# Patient Record
Sex: Male | Born: 1945
Health system: Southern US, Community
[De-identification: ages and names within clinical notes are randomized; demographics above are authoritative.]

## PROBLEM LIST (undated history)

## (undated) DIAGNOSIS — I471 Supraventricular tachycardia: Secondary | ICD-10-CM

## (undated) DIAGNOSIS — K5790 Diverticulosis of intestine, part unspecified, without perforation or abscess without bleeding: Secondary | ICD-10-CM

## (undated) DIAGNOSIS — M545 Low back pain, unspecified: Secondary | ICD-10-CM

## (undated) DIAGNOSIS — G4730 Sleep apnea, unspecified: Secondary | ICD-10-CM

## (undated) DIAGNOSIS — I739 Peripheral vascular disease, unspecified: Secondary | ICD-10-CM

## (undated) DIAGNOSIS — I10 Essential (primary) hypertension: Secondary | ICD-10-CM

## (undated) DIAGNOSIS — I209 Angina pectoris, unspecified: Secondary | ICD-10-CM

## (undated) DIAGNOSIS — J189 Pneumonia, unspecified organism: Secondary | ICD-10-CM

## (undated) DIAGNOSIS — G4733 Obstructive sleep apnea (adult) (pediatric): Secondary | ICD-10-CM

## (undated) DIAGNOSIS — R51 Headache: Secondary | ICD-10-CM

## (undated) DIAGNOSIS — I639 Cerebral infarction, unspecified: Secondary | ICD-10-CM

## (undated) DIAGNOSIS — I219 Acute myocardial infarction, unspecified: Secondary | ICD-10-CM

## (undated) DIAGNOSIS — K648 Other hemorrhoids: Secondary | ICD-10-CM

## (undated) DIAGNOSIS — I82409 Acute embolism and thrombosis of unspecified deep veins of unspecified lower extremity: Secondary | ICD-10-CM

## (undated) DIAGNOSIS — I679 Cerebrovascular disease, unspecified: Secondary | ICD-10-CM

## (undated) DIAGNOSIS — E785 Hyperlipidemia, unspecified: Secondary | ICD-10-CM

## (undated) DIAGNOSIS — Z87442 Personal history of urinary calculi: Secondary | ICD-10-CM

## (undated) DIAGNOSIS — M1990 Unspecified osteoarthritis, unspecified site: Secondary | ICD-10-CM

## (undated) DIAGNOSIS — K2960 Other gastritis without bleeding: Secondary | ICD-10-CM

## (undated) DIAGNOSIS — K257 Chronic gastric ulcer without hemorrhage or perforation: Secondary | ICD-10-CM

## (undated) DIAGNOSIS — I2510 Atherosclerotic heart disease of native coronary artery without angina pectoris: Secondary | ICD-10-CM

## (undated) DIAGNOSIS — R0602 Shortness of breath: Secondary | ICD-10-CM

## (undated) DIAGNOSIS — K219 Gastro-esophageal reflux disease without esophagitis: Secondary | ICD-10-CM

## (undated) HISTORY — DX: Diverticulosis of intestine, part unspecified, without perforation or abscess without bleeding: K57.90

## (undated) HISTORY — DX: Low back pain, unspecified: M54.50

## (undated) HISTORY — PX: UPPER GASTROINTESTINAL ENDOSCOPY: SHX188

## (undated) HISTORY — PX: ARTHROSCOPY KNEE W/ DRILLING: SUR92

## (undated) HISTORY — DX: Other hemorrhoids: K64.8

## (undated) HISTORY — DX: Peripheral vascular disease, unspecified: I73.9

## (undated) HISTORY — DX: Cerebrovascular disease, unspecified: I67.9

## (undated) HISTORY — DX: Unspecified osteoarthritis, unspecified site: M19.90

## (undated) HISTORY — DX: Low back pain: M54.5

## (undated) HISTORY — DX: Other gastritis without bleeding: K29.60

## (undated) HISTORY — DX: Obstructive sleep apnea (adult) (pediatric): G47.33

## (undated) HISTORY — DX: Atherosclerotic heart disease of native coronary artery without angina pectoris: I25.10

## (undated) HISTORY — PX: CATARACT EXTRACTION: SUR2

## (undated) HISTORY — DX: Gastro-esophageal reflux disease without esophagitis: K21.9

## (undated) HISTORY — DX: Essential (primary) hypertension: I10

## (undated) HISTORY — DX: Cerebral infarction, unspecified: I63.9

## (undated) HISTORY — DX: Hyperlipidemia, unspecified: E78.5

## (undated) HISTORY — PX: COLONOSCOPY: SHX174

---
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
## 1995-11-03 DIAGNOSIS — I219 Acute myocardial infarction, unspecified: Secondary | ICD-10-CM

## 1995-11-03 HISTORY — PX: CORONARY ANGIOPLASTY WITH STENT PLACEMENT: SHX49

## 1995-11-03 HISTORY — DX: Acute myocardial infarction, unspecified: I21.9

## 1999-05-21 ENCOUNTER — Observation Stay (HOSPITAL_COMMUNITY): Admission: AD | Admit: 1999-05-21 | Discharge: 1999-05-22 | Payer: Self-pay | Admitting: Cardiology

## 1999-05-27 ENCOUNTER — Observation Stay (HOSPITAL_COMMUNITY): Admission: EM | Admit: 1999-05-27 | Discharge: 1999-05-28 | Payer: Self-pay | Admitting: Emergency Medicine

## 1999-05-27 ENCOUNTER — Encounter: Payer: Self-pay | Admitting: Emergency Medicine

## 1999-07-27 ENCOUNTER — Encounter: Payer: Self-pay | Admitting: Orthopedic Surgery

## 1999-07-27 ENCOUNTER — Ambulatory Visit (HOSPITAL_COMMUNITY): Admission: RE | Admit: 1999-07-27 | Discharge: 1999-07-27 | Payer: Self-pay | Admitting: Orthopedic Surgery

## 2000-02-16 ENCOUNTER — Ambulatory Visit (HOSPITAL_COMMUNITY): Admission: RE | Admit: 2000-02-16 | Discharge: 2000-02-16 | Payer: Self-pay | Admitting: Orthopedic Surgery

## 2000-02-16 ENCOUNTER — Encounter: Payer: Self-pay | Admitting: Orthopedic Surgery

## 2000-07-07 ENCOUNTER — Encounter: Payer: Self-pay | Admitting: Orthopedic Surgery

## 2000-07-07 ENCOUNTER — Encounter: Admission: RE | Admit: 2000-07-07 | Discharge: 2000-07-07 | Payer: Self-pay | Admitting: Orthopedic Surgery

## 2000-07-08 ENCOUNTER — Ambulatory Visit (HOSPITAL_BASED_OUTPATIENT_CLINIC_OR_DEPARTMENT_OTHER): Admission: RE | Admit: 2000-07-08 | Discharge: 2000-07-08 | Payer: Self-pay | Admitting: Orthopedic Surgery

## 2001-09-01 ENCOUNTER — Ambulatory Visit (HOSPITAL_COMMUNITY): Admission: RE | Admit: 2001-09-01 | Discharge: 2001-09-01 | Payer: Self-pay | Admitting: Gastroenterology

## 2001-09-01 ENCOUNTER — Encounter: Payer: Self-pay | Admitting: Gastroenterology

## 2002-06-11 ENCOUNTER — Inpatient Hospital Stay (HOSPITAL_COMMUNITY): Admission: EM | Admit: 2002-06-11 | Discharge: 2002-06-12 | Payer: Self-pay | Admitting: Emergency Medicine

## 2002-06-11 ENCOUNTER — Encounter: Payer: Self-pay | Admitting: Emergency Medicine

## 2002-06-12 ENCOUNTER — Encounter: Payer: Self-pay | Admitting: *Deleted

## 2002-07-04 ENCOUNTER — Ambulatory Visit (HOSPITAL_COMMUNITY): Admission: RE | Admit: 2002-07-04 | Discharge: 2002-07-04 | Payer: Self-pay | Admitting: Cardiovascular Disease

## 2003-08-27 ENCOUNTER — Inpatient Hospital Stay (HOSPITAL_COMMUNITY): Admission: EM | Admit: 2003-08-27 | Discharge: 2003-08-28 | Payer: Self-pay | Admitting: Emergency Medicine

## 2003-08-27 ENCOUNTER — Encounter: Payer: Self-pay | Admitting: Emergency Medicine

## 2003-09-05 ENCOUNTER — Ambulatory Visit (HOSPITAL_COMMUNITY): Admission: RE | Admit: 2003-09-05 | Discharge: 2003-09-05 | Payer: Self-pay | Admitting: Internal Medicine

## 2005-03-11 ENCOUNTER — Ambulatory Visit: Payer: Self-pay | Admitting: Internal Medicine

## 2005-03-23 ENCOUNTER — Ambulatory Visit: Payer: Self-pay | Admitting: Cardiology

## 2005-03-31 ENCOUNTER — Ambulatory Visit: Payer: Self-pay | Admitting: Cardiology

## 2005-04-10 ENCOUNTER — Ambulatory Visit: Payer: Self-pay

## 2005-05-14 ENCOUNTER — Ambulatory Visit: Payer: Self-pay | Admitting: Internal Medicine

## 2005-05-14 ENCOUNTER — Inpatient Hospital Stay (HOSPITAL_COMMUNITY): Admission: EM | Admit: 2005-05-14 | Discharge: 2005-05-15 | Payer: Self-pay | Admitting: Emergency Medicine

## 2005-05-22 ENCOUNTER — Ambulatory Visit: Payer: Self-pay | Admitting: Internal Medicine

## 2005-09-02 ENCOUNTER — Ambulatory Visit: Payer: Self-pay | Admitting: Cardiology

## 2006-04-02 ENCOUNTER — Ambulatory Visit: Payer: Self-pay | Admitting: Internal Medicine

## 2006-04-20 ENCOUNTER — Ambulatory Visit: Payer: Self-pay

## 2006-04-23 ENCOUNTER — Ambulatory Visit: Payer: Self-pay | Admitting: Internal Medicine

## 2006-05-06 ENCOUNTER — Ambulatory Visit: Payer: Self-pay | Admitting: Cardiology

## 2006-07-12 ENCOUNTER — Ambulatory Visit: Payer: Self-pay | Admitting: Internal Medicine

## 2006-12-06 ENCOUNTER — Ambulatory Visit: Payer: Self-pay | Admitting: Internal Medicine

## 2007-04-12 ENCOUNTER — Ambulatory Visit: Payer: Self-pay

## 2007-09-01 ENCOUNTER — Ambulatory Visit: Payer: Self-pay | Admitting: Internal Medicine

## 2007-09-07 ENCOUNTER — Encounter: Payer: Self-pay | Admitting: *Deleted

## 2007-09-07 DIAGNOSIS — Z9089 Acquired absence of other organs: Secondary | ICD-10-CM | POA: Insufficient documentation

## 2007-09-07 DIAGNOSIS — Z9861 Coronary angioplasty status: Secondary | ICD-10-CM

## 2007-09-07 DIAGNOSIS — Z9889 Other specified postprocedural states: Secondary | ICD-10-CM | POA: Insufficient documentation

## 2007-09-07 DIAGNOSIS — I219 Acute myocardial infarction, unspecified: Secondary | ICD-10-CM | POA: Insufficient documentation

## 2007-09-07 DIAGNOSIS — E1169 Type 2 diabetes mellitus with other specified complication: Secondary | ICD-10-CM

## 2007-09-07 DIAGNOSIS — Z9189 Other specified personal risk factors, not elsewhere classified: Secondary | ICD-10-CM | POA: Insufficient documentation

## 2007-09-07 DIAGNOSIS — I2510 Atherosclerotic heart disease of native coronary artery without angina pectoris: Secondary | ICD-10-CM | POA: Insufficient documentation

## 2007-09-07 DIAGNOSIS — I10 Essential (primary) hypertension: Secondary | ICD-10-CM | POA: Insufficient documentation

## 2007-09-07 DIAGNOSIS — E785 Hyperlipidemia, unspecified: Secondary | ICD-10-CM | POA: Insufficient documentation

## 2007-09-07 HISTORY — DX: Coronary angioplasty status: Z98.61

## 2007-09-07 HISTORY — DX: Type 2 diabetes mellitus with other specified complication: E11.69

## 2007-09-07 HISTORY — DX: Essential (primary) hypertension: I10

## 2007-10-18 ENCOUNTER — Ambulatory Visit: Payer: Self-pay | Admitting: Internal Medicine

## 2007-10-18 ENCOUNTER — Encounter: Payer: Self-pay | Admitting: Internal Medicine

## 2007-10-18 DIAGNOSIS — R059 Cough, unspecified: Secondary | ICD-10-CM | POA: Insufficient documentation

## 2007-10-18 DIAGNOSIS — R05 Cough: Secondary | ICD-10-CM | POA: Insufficient documentation

## 2007-10-18 DIAGNOSIS — D485 Neoplasm of uncertain behavior of skin: Secondary | ICD-10-CM | POA: Insufficient documentation

## 2007-10-18 HISTORY — DX: Neoplasm of uncertain behavior of skin: D48.5

## 2007-10-19 LAB — CONVERTED CEMR LAB
ALT: 31 units/L (ref 0–53)
AST: 21 units/L (ref 0–37)
Albumin: 4.2 g/dL (ref 3.5–5.2)
Alkaline Phosphatase: 67 units/L (ref 39–117)
BUN: 14 mg/dL (ref 6–23)
Basophils Absolute: 0 10*3/uL (ref 0.0–0.1)
Basophils Relative: 0.6 % (ref 0.0–1.0)
Bilirubin Urine: NEGATIVE
Bilirubin, Direct: 0.1 mg/dL (ref 0.0–0.3)
CO2: 27 meq/L (ref 19–32)
Calcium: 9.6 mg/dL (ref 8.4–10.5)
Chloride: 103 meq/L (ref 96–112)
Cholesterol: 331 mg/dL (ref 0–200)
Creatinine, Ser: 1.1 mg/dL (ref 0.4–1.5)
Direct LDL: 219 mg/dL
Eosinophils Absolute: 0.6 10*3/uL (ref 0.0–0.6)
Eosinophils Relative: 9.9 % — ABNORMAL HIGH (ref 0.0–5.0)
GFR calc Af Amer: 88 mL/min
GFR calc non Af Amer: 72 mL/min
Glucose, Bld: 103 mg/dL — ABNORMAL HIGH (ref 70–99)
HCT: 47 % (ref 39.0–52.0)
HDL: 34.9 mg/dL — ABNORMAL LOW (ref >39.0)
Hemoglobin: 16.3 g/dL (ref 13.0–17.0)
Ketones, ur: NEGATIVE mg/dL
Leukocytes, UA: NEGATIVE
Lymphocytes Relative: 28.6 % (ref 12.0–46.0)
MCHC: 34.6 g/dL (ref 30.0–36.0)
MCV: 90.3 fL (ref 78.0–100.0)
Monocytes Absolute: 0.3 10*3/uL (ref 0.2–0.7)
Monocytes Relative: 5.5 % (ref 3.0–11.0)
Neutro Abs: 3.4 10*3/uL (ref 1.4–7.7)
Neutrophils Relative %: 55.4 % (ref 43.0–77.0)
Nitrite: NEGATIVE
PSA: 1.85 ng/mL (ref 0.10–4.00)
Platelets: 280 10*3/uL (ref 150–400)
Potassium: 4.4 meq/L (ref 3.5–5.1)
RBC: 5.2 M/uL (ref 4.22–5.81)
RDW: 12.7 % (ref 11.5–14.6)
Sodium: 138 meq/L (ref 135–145)
Specific Gravity, Urine: 1.015 (ref 1.000–1.03)
TSH: 3.59 microintl units/mL (ref 0.35–5.50)
Total Bilirubin: 0.7 mg/dL (ref 0.3–1.2)
Total CHOL/HDL Ratio: 9.5
Total Protein, Urine: NEGATIVE mg/dL
Total Protein: 7.1 g/dL (ref 6.0–8.3)
Triglycerides: 347 mg/dL (ref 0–149)
Urine Glucose: NEGATIVE mg/dL
Urobilinogen, UA: 0.2 (ref 0.0–1.0)
VLDL: 69 mg/dL — ABNORMAL HIGH (ref 0–40)
WBC: 6 10*3/uL (ref 4.5–10.5)
pH: 5.5 (ref 5.0–8.0)

## 2007-11-08 ENCOUNTER — Ambulatory Visit: Payer: Self-pay | Admitting: Internal Medicine

## 2007-11-22 ENCOUNTER — Ambulatory Visit: Payer: Self-pay | Admitting: Gastroenterology

## 2007-12-06 ENCOUNTER — Ambulatory Visit: Payer: Self-pay | Admitting: Gastroenterology

## 2007-12-06 ENCOUNTER — Encounter: Payer: Self-pay | Admitting: Gastroenterology

## 2008-02-20 ENCOUNTER — Ambulatory Visit: Payer: Self-pay | Admitting: Internal Medicine

## 2008-02-20 DIAGNOSIS — R109 Unspecified abdominal pain: Secondary | ICD-10-CM | POA: Insufficient documentation

## 2008-02-20 DIAGNOSIS — K219 Gastro-esophageal reflux disease without esophagitis: Secondary | ICD-10-CM | POA: Insufficient documentation

## 2008-02-20 HISTORY — DX: Gastro-esophageal reflux disease without esophagitis: K21.9

## 2008-02-20 LAB — CONVERTED CEMR LAB
ALT: 25 units/L (ref 0–53)
AST: 19 units/L (ref 0–37)
Albumin: 3.9 g/dL (ref 3.5–5.2)
Alkaline Phosphatase: 67 units/L (ref 39–117)
Amylase: 69 units/L (ref 27–131)
BUN: 21 mg/dL (ref 6–23)
Basophils Absolute: 0 10*3/uL (ref 0.0–0.1)
Basophils Relative: 0.5 % (ref 0.0–1.0)
Bilirubin Urine: NEGATIVE
Bilirubin, Direct: 0.1 mg/dL (ref 0.0–0.3)
CO2: 31 meq/L (ref 19–32)
Calcium: 9.3 mg/dL (ref 8.4–10.5)
Chloride: 108 meq/L (ref 96–112)
Creatinine, Ser: 1.2 mg/dL (ref 0.4–1.5)
Eosinophils Absolute: 0.8 10*3/uL — ABNORMAL HIGH (ref 0.0–0.7)
Eosinophils Relative: 12.7 % — ABNORMAL HIGH (ref 0.0–5.0)
GFR calc Af Amer: 79 mL/min
GFR calc non Af Amer: 65 mL/min
Glucose, Bld: 107 mg/dL — ABNORMAL HIGH (ref 70–99)
HCT: 44.9 % (ref 39.0–52.0)
Hemoglobin, Urine: NEGATIVE
Hemoglobin: 15.5 g/dL (ref 13.0–17.0)
Ketones, ur: NEGATIVE mg/dL
Leukocytes, UA: NEGATIVE
Lymphocytes Relative: 27.2 % (ref 12.0–46.0)
MCHC: 34.4 g/dL (ref 30.0–36.0)
MCV: 91.5 fL (ref 78.0–100.0)
Monocytes Absolute: 0.4 10*3/uL (ref 0.1–1.0)
Monocytes Relative: 7.4 % (ref 3.0–12.0)
Neutro Abs: 3.2 10*3/uL (ref 1.4–7.7)
Neutrophils Relative %: 52.2 % (ref 43.0–77.0)
Nitrite: NEGATIVE
Platelets: 294 10*3/uL (ref 150–400)
Potassium: 5.2 meq/L — ABNORMAL HIGH (ref 3.5–5.1)
RBC: 4.91 M/uL (ref 4.22–5.81)
RDW: 13.2 % (ref 11.5–14.6)
Sed Rate: 10 mm/hr (ref 0–16)
Sodium: 142 meq/L (ref 135–145)
Specific Gravity, Urine: 1.025 (ref 1.000–1.03)
Total Bilirubin: 0.6 mg/dL (ref 0.3–1.2)
Total Protein, Urine: NEGATIVE mg/dL
Total Protein: 6.6 g/dL (ref 6.0–8.3)
Urine Glucose: NEGATIVE mg/dL
Urobilinogen, UA: 0.2 (ref 0.0–1.0)
WBC: 6 10*3/uL (ref 4.5–10.5)
pH: 6 (ref 5.0–8.0)

## 2008-02-23 ENCOUNTER — Ambulatory Visit: Payer: Self-pay | Admitting: Internal Medicine

## 2008-03-06 ENCOUNTER — Ambulatory Visit: Payer: Self-pay | Admitting: Internal Medicine

## 2008-04-23 ENCOUNTER — Ambulatory Visit: Payer: Self-pay | Admitting: Internal Medicine

## 2008-08-20 ENCOUNTER — Ambulatory Visit: Payer: Self-pay | Admitting: Internal Medicine

## 2008-08-20 ENCOUNTER — Encounter: Payer: Self-pay | Admitting: Internal Medicine

## 2008-12-19 ENCOUNTER — Ambulatory Visit: Payer: Self-pay | Admitting: Internal Medicine

## 2008-12-19 DIAGNOSIS — R609 Edema, unspecified: Secondary | ICD-10-CM | POA: Insufficient documentation

## 2008-12-19 DIAGNOSIS — I872 Venous insufficiency (chronic) (peripheral): Secondary | ICD-10-CM

## 2008-12-19 HISTORY — DX: Venous insufficiency (chronic) (peripheral): I87.2

## 2009-02-12 ENCOUNTER — Ambulatory Visit: Payer: Self-pay | Admitting: Internal Medicine

## 2009-02-12 DIAGNOSIS — J209 Acute bronchitis, unspecified: Secondary | ICD-10-CM | POA: Insufficient documentation

## 2009-04-03 ENCOUNTER — Ambulatory Visit: Payer: Self-pay | Admitting: Cardiology

## 2009-04-03 DIAGNOSIS — R0602 Shortness of breath: Secondary | ICD-10-CM | POA: Insufficient documentation

## 2009-04-03 DIAGNOSIS — F17200 Nicotine dependence, unspecified, uncomplicated: Secondary | ICD-10-CM | POA: Insufficient documentation

## 2009-04-03 HISTORY — DX: Shortness of breath: R06.02

## 2009-04-12 ENCOUNTER — Encounter: Payer: Self-pay | Admitting: Cardiology

## 2009-04-12 ENCOUNTER — Ambulatory Visit: Payer: Self-pay

## 2009-06-18 ENCOUNTER — Ambulatory Visit: Payer: Self-pay | Admitting: Internal Medicine

## 2009-06-18 DIAGNOSIS — J029 Acute pharyngitis, unspecified: Secondary | ICD-10-CM | POA: Insufficient documentation

## 2010-02-27 ENCOUNTER — Ambulatory Visit: Payer: Self-pay | Admitting: Internal Medicine

## 2010-02-27 DIAGNOSIS — M545 Low back pain, unspecified: Secondary | ICD-10-CM

## 2010-02-27 DIAGNOSIS — M1990 Unspecified osteoarthritis, unspecified site: Secondary | ICD-10-CM | POA: Insufficient documentation

## 2010-02-27 DIAGNOSIS — M2550 Pain in unspecified joint: Secondary | ICD-10-CM

## 2010-02-27 HISTORY — DX: Low back pain, unspecified: M54.50

## 2010-02-27 HISTORY — DX: Pain in unspecified joint: M25.50

## 2010-02-27 LAB — CONVERTED CEMR LAB
ALT: 20 units/L (ref 0–53)
AST: 16 units/L (ref 0–37)
Albumin: 4.1 g/dL (ref 3.5–5.2)
Alkaline Phosphatase: 69 units/L (ref 39–117)
BUN: 19 mg/dL (ref 6–23)
Bilirubin, Direct: 0.1 mg/dL (ref 0.0–0.3)
CO2: 28 meq/L (ref 19–32)
Calcium: 9.4 mg/dL (ref 8.4–10.5)
Chloride: 106 meq/L (ref 96–112)
Cholesterol: 246 mg/dL — ABNORMAL HIGH (ref 0–200)
Creatinine, Ser: 1.1 mg/dL (ref 0.4–1.5)
Direct LDL: 165.4 mg/dL
GFR calc non Af Amer: 71.58 mL/min (ref >60)
Glucose, Bld: 95 mg/dL (ref 70–99)
HDL: 36.3 mg/dL — ABNORMAL LOW (ref >39.00)
Potassium: 4.6 meq/L (ref 3.5–5.1)
Rheumatoid fact SerPl-aCnc: <20 intl units/mL (ref 0.0–20.0)
Sed Rate: 14 mm/hr (ref 0–22)
Sodium: 140 meq/L (ref 135–145)
TSH: 2.63 microintl units/mL (ref 0.35–5.50)
Total Bilirubin: 0.5 mg/dL (ref 0.3–1.2)
Total CHOL/HDL Ratio: 7
Total Protein: 6.4 g/dL (ref 6.0–8.3)
Triglycerides: 193 mg/dL — ABNORMAL HIGH (ref 0.0–149.0)
Uric Acid, Serum: 5.6 mg/dL (ref 4.0–7.8)
VLDL: 38.6 mg/dL (ref 0.0–40.0)
Vitamin B-12: 557 pg/mL (ref 211–911)

## 2010-03-12 ENCOUNTER — Encounter (INDEPENDENT_AMBULATORY_CARE_PROVIDER_SITE_OTHER): Payer: Self-pay | Admitting: *Deleted

## 2010-03-12 ENCOUNTER — Telehealth: Payer: Self-pay | Admitting: Cardiology

## 2010-04-01 ENCOUNTER — Ambulatory Visit: Payer: Self-pay | Admitting: Internal Medicine

## 2010-04-02 ENCOUNTER — Inpatient Hospital Stay (HOSPITAL_COMMUNITY): Admission: EM | Admit: 2010-04-02 | Discharge: 2010-04-04 | Payer: Self-pay | Admitting: Emergency Medicine

## 2010-04-02 ENCOUNTER — Ambulatory Visit: Payer: Self-pay | Admitting: Cardiology

## 2010-04-08 ENCOUNTER — Encounter: Payer: Self-pay | Admitting: Cardiology

## 2010-04-22 ENCOUNTER — Encounter: Payer: Self-pay | Admitting: Physician Assistant

## 2010-04-23 ENCOUNTER — Encounter: Payer: Self-pay | Admitting: Cardiology

## 2010-04-23 ENCOUNTER — Ambulatory Visit: Payer: Self-pay

## 2010-04-23 ENCOUNTER — Encounter: Payer: Self-pay | Admitting: Physician Assistant

## 2010-04-23 ENCOUNTER — Ambulatory Visit: Payer: Self-pay | Admitting: Cardiovascular Disease

## 2010-04-23 DIAGNOSIS — I6529 Occlusion and stenosis of unspecified carotid artery: Secondary | ICD-10-CM | POA: Insufficient documentation

## 2010-05-01 ENCOUNTER — Encounter: Payer: Self-pay | Admitting: Cardiology

## 2010-06-02 ENCOUNTER — Ambulatory Visit: Payer: Self-pay | Admitting: Cardiovascular Disease

## 2010-06-02 DIAGNOSIS — I70219 Atherosclerosis of native arteries of extremities with intermittent claudication, unspecified extremity: Secondary | ICD-10-CM | POA: Insufficient documentation

## 2010-06-02 HISTORY — DX: Atherosclerosis of native arteries of extremities with intermittent claudication, unspecified extremity: I70.219

## 2010-06-30 ENCOUNTER — Ambulatory Visit: Payer: Self-pay | Admitting: Cardiology

## 2010-07-23 ENCOUNTER — Ambulatory Visit: Payer: Self-pay | Admitting: Cardiology

## 2010-08-06 ENCOUNTER — Encounter (INDEPENDENT_AMBULATORY_CARE_PROVIDER_SITE_OTHER): Payer: Self-pay | Admitting: *Deleted

## 2010-08-06 LAB — CONVERTED CEMR LAB
ALT: 19 units/L (ref 0–53)
AST: 15 units/L (ref 0–37)
Albumin: 4.1 g/dL (ref 3.5–5.2)
Alkaline Phosphatase: 58 units/L (ref 39–117)
Bilirubin, Direct: 0.1 mg/dL (ref 0.0–0.3)
Cholesterol: 197 mg/dL (ref 0–200)
HDL: 42.8 mg/dL (ref >39.00)
LDL Cholesterol: 125 mg/dL — ABNORMAL HIGH (ref 0–99)
Total Bilirubin: 0.4 mg/dL (ref 0.3–1.2)
Total CHOL/HDL Ratio: 5
Total Protein: 6.4 g/dL (ref 6.0–8.3)
Triglycerides: 145 mg/dL (ref 0.0–149.0)
VLDL: 29 mg/dL (ref 0.0–40.0)

## 2010-08-13 ENCOUNTER — Telehealth: Payer: Self-pay | Admitting: Cardiology

## 2010-09-02 HISTORY — PX: PERIPHERAL ARTERIAL STENT GRAFT: SHX2220

## 2010-09-04 ENCOUNTER — Ambulatory Visit: Payer: Self-pay | Admitting: Cardiovascular Disease

## 2010-09-17 ENCOUNTER — Telehealth: Payer: Self-pay | Admitting: Cardiovascular Disease

## 2010-09-17 ENCOUNTER — Ambulatory Visit: Payer: Self-pay | Admitting: Cardiovascular Disease

## 2010-09-17 ENCOUNTER — Ambulatory Visit (HOSPITAL_COMMUNITY): Admission: RE | Admit: 2010-09-17 | Discharge: 2010-09-17 | Payer: Self-pay | Admitting: Cardiovascular Disease

## 2010-09-30 ENCOUNTER — Ambulatory Visit: Payer: Self-pay | Admitting: Cardiology

## 2010-10-13 ENCOUNTER — Encounter: Payer: Self-pay | Admitting: Cardiovascular Disease

## 2010-10-14 ENCOUNTER — Encounter: Payer: Self-pay | Admitting: Cardiovascular Disease

## 2010-10-14 ENCOUNTER — Ambulatory Visit: Payer: Self-pay | Admitting: Cardiovascular Disease

## 2010-10-14 ENCOUNTER — Ambulatory Visit: Payer: Self-pay

## 2010-12-02 NOTE — Miscellaneous (Signed)
Summary: Orders Update  Clinical Lists Changes  Problems: Added new problem of CAROTID ARTERY DISEASE (ICD-433.10) Orders: Added new Test order of Carotid Duplex (Carotid Duplex) - Signed 

## 2010-12-02 NOTE — Miscellaneous (Signed)
Summary: Rx  Clinical Lists Changes  Medications: Added new medication of SIMVASTATIN 40 MG TABS (SIMVASTATIN) Take one tablet at bedtime - Signed Rx of SIMVASTATIN 40 MG TABS (SIMVASTATIN) Take one tablet at bedtime;  #30 x 12;  Signed;  Entered by: Tresa Garter MD;  Authorized by: Tresa Garter MD;  Method used: Print then Give to Patient Rx of SIMVASTATIN 40 MG TABS (SIMVASTATIN) Take one tablet at bedtime;  #30 x 12;  Signed;  Entered by: Tora Perches;  Authorized by: Tresa Garter MD;  Method used: Print then Give to Patient    Prescriptions: SIMVASTATIN 40 MG TABS (SIMVASTATIN) Take one tablet at bedtime  #30 x 12   Entered by:   Tora Perches   Authorized by:   Tresa Garter MD   Signed by:   Tora Perches on 10/19/2007   Method used:   Print then Give to Patient   RxID:   [id number] SIMVASTATIN 40 MG TABS (SIMVASTATIN) Take one tablet at bedtime  #30 x 12   Entered and Authorized by:   Tresa Garter MD   Signed by:   Tresa Garter MD on 10/18/2007   Method used:   Print then Give to Patient   RxID:   [id number]

## 2010-12-02 NOTE — Assessment & Plan Note (Signed)
Summary: sore throat/plot/cd   Vital Signs:  Patient profile:   65 year old male Height:      65 inches (165.10 cm) Weight:      160.4 pounds (72.91 kg) O2 Sat:      94 % Temp:     98.4 degrees F (36.89 degrees C) oral Pulse rate:   67 / minute BP sitting:   104 / 72  (left arm) Cuff size:   regular  Vitals Entered By: Orlan Leavens (June 18, 2009 11:14 AM) CC: cough, night sweats, sore throat x's 2 days, URI symptoms Is Patient Diabetic? No Pain Assessment Patient in pain? no        Primary Care Provider:  Georgina Quint Plotnikov MD  CC:  cough, night sweats, sore throat x's 2 days, and URI symptoms.  History of Present Illness:  URI Symptoms      This is a 65 year old man who presents with URI symptoms.  The symptoms began 2 days ago.  The patient reports sore throat and dry cough, but denies nasal congestion and sick contacts.  Associated symptoms include low-grade fever (<100.5 degrees).  The patient denies dyspnea and wheezing.  The patient also reports muscle aches.  The patient denies itchy throat, seasonal symptoms, headache, and severe fatigue.    Preventive Screening-Counseling & Management  Alcohol-Tobacco     Smoking Cessation Counseling: yes  Current Medications (verified): 1)  Simvastatin 40 Mg Tabs (Simvastatin) .... Take One Tablet At Bedtime 2)  Baby Aspirin 81 Mg  Chew (Aspirin) .... Once Daily 3)  Vitamin D3 1000 Unit  Tabs (Cholecalciferol) .[name].. 1 Qd  Allergies (verified): 1)  ! Zocor 2)  ! * Eggs  Past History:  Past medical, surgical, family and social histories (including risk factors) reviewed, and no changes noted (except as noted below).  Past Medical History: Reviewed history from 02/20/2008 and no changes required. HYPERTENSION, MILD (ICD-401.1) MYOCARDIAL INFARCTION (ICD-410.90) GASTROESOPHAGEAL REFLUX DISEASE, HX OF (ICD-V12.79) CHEST PAIN, ATYPICAL, HX OF (ICD-V15.89) HYPERLIPIDEMIA (ICD-272.4) CORONARY ARTERY DISEASE (ICD-414.00)   GERD  Past Surgical History: Reviewed history from 09/07/2007 and no changes required. ARTHROSCOPY, KNEE, HX OF (ICD-V45.89) PERCUTANEOUS TRANSLUMINAL CORONARY ANGIOPLASTY, HX OF (ICD-V45.82) * BACK SURGERY APPENDECTOMY, HX OF (ICD-V45.79)  Family History: Reviewed history from 10/18/2007 and no changes required. Family History of CAD Male 1st degree relative <50  Social History: Reviewed history from 10/18/2007 and no changes required. Occupation: Current Smoker Married Alcohol use-no  Review of Systems  The patient denies hoarseness, chest pain, prolonged cough, and headaches.    Physical Exam  General:  alert, well-developed, well-nourished, and cooperative to examination.    Eyes:  vision grossly intact; pupils equal, round and reactive to light.  conjunctiva and lids normal.    Ears:  normal pinnae bilaterally, without erythema, swelling, or tenderness to palpation. TMs clear, without effusion, or cerumen impaction. Hearing grossly normal bilaterally  Mouth:  pharyngeal erythema. fair dentition.  upper denture intact - no exudate or postnasal drip appreciated Lungs:  bilateral rhonchi L>R; no wheeze;  increase work of breathing at rest or with conversation Heart:  normal rate, regular rhythm, no murmur, and no rub. BLE without edema. Skin:  no rashes, no purpura, and no edema.     Impression & Recommendations:  Problem # 1:  PHARYNGITIS, ACUTE (ICD-462)  given ENT and lung exam, will treat with 7 d emperic abx His updated medication list for this problem includes:    Baby Aspirin 81 Mg Chew (  Aspirin) ..... Once daily    Ceftin 500 Mg Tabs (Cefuroxime axetil) .[name]... 1 by mouth two times a day  Orders: Prescription Created Electronically [id number]) Tobacco use cessation intermediate 3-10 minutes (60454)  Problem # 2:  TOBACCO ABUSE (ICD-305.1)  4 min counseling and discusseion on need to quit smoking, esp in setting of recurrent URI symptoms (tx for similar symptoms  4/10)  Orders: Tobacco use cessation intermediate 3-10 minutes (99406)  Complete Medication List: 1)  Simvastatin 40 Mg Tabs (Simvastatin) .... Take one tablet at bedtime 2)  Baby Aspirin 81 Mg Chew (Aspirin) .... Once daily 3)  Vitamin D3 1000 Unit Tabs (Cholecalciferol) .[name].. 1 qd 4)  Ceftin 500 Mg Tabs (Cefuroxime axetil) .[name].. 1 by mouth two times a day  Patient Instructions: 1)  prescription for ceftin (antibiotics) sent to [facility] - please take as prescribed 2)  Get plenty of rest, drink lots of clear liquids, and use Tylenol or Ibuprofen for fever and comfort. Return in 7-10 days if you're not better; sooner if you're feeling worse. 3)  Tobacco is very bad for your health and your loved ones! You Should stop smoking!. Prescriptions: CEFTIN 500 MG TABS (CEFUROXIME AXETIL) 1 by mouth two times a day  #14 x 0   Entered and Authorized by:   Newt Lukes MD   Signed by:   Newt Lukes MD on 06/18/2009   Method used:   Electronically to        [facility] [id number] (retail)       [address] [place], [place]  [address]       Ph: [phone]       Fax: [phone]   RxID:   [id number]

## 2010-12-02 NOTE — Assessment & Plan Note (Signed)
Summary: f1y/kfw    History of Present Illness: Pleasant male that I have seen in the past for coronary disease. He has a history of a prior stent to a small nondominant right coronary artery in [place] in 1997 per old notes. His last catheterization was performed in September of 2003. At that time he had a 20% left main, a 30% LAD, a 20-30% circumflex and a 30-40% stenosis in tit was interpreted as possible non-transmural inferior infarct with slight peri-infarct ischemia. I did review this and felt it was diaphragmatic attenuation with no significant ischemia. We have treated medically. Since I last saw him in 2007 he does have dyspnea on exertion but this occurs with only more extreme activities. It is relieved with rest. It is unchanged compared to previous. There is no associated chest pain or productive cough. There is no orthopnea, PND, pedal edema, exertional chest pain or syncope.he right corner artery which is nondominant. His ejection fraction was normal. Last Myoview was performed in June of 2007. At that time he had an ejection fraction of 52%.  Current Medications (verified): 1)  Simvastatin 40 Mg Tabs (Simvastatin) .... Take One Tablet At Bedtime 2)  Baby Aspirin 81 Mg  Chew (Aspirin) .... Once Daily 3)  Vitamin D3 1000 Unit  Tabs (Cholecalciferol) .[name].. 1 Qd  Allergies: 1)  ! Zocor 2)  ! * Eggs  Past History:  Past Medical History: Reviewed history from 02/20/2008 and no changes required. HYPERTENSION, MILD (ICD-401.1) MYOCARDIAL INFARCTION (ICD-410.90) GASTROESOPHAGEAL REFLUX DISEASE, HX OF (ICD-V12.79) CHEST PAIN, ATYPICAL, HX OF (ICD-V15.89) HYPERLIPIDEMIA (ICD-272.4) CORONARY ARTERY DISEASE (ICD-414.00)   GERD  Social History: Reviewed history from 10/18/2007 and no changes required. Occupation: Current Smoker Married Alcohol use-no  Review of Systems       no fevers or chills, productive cough, hemoptysis, dysphasia, odynophagia, melena, hematochezia,  dysuria, hematuria, rash, seizure activity, orthopnea, PND, pedal edema, claudication. Remaining systems are negative.   Vital Signs:  Patient profile:   65 year old male Height:      65 inches Weight:      163 pounds BMI:     27.22 Pulse rate:   68 / minute BP sitting:   102 / 62  (left arm)  Physical Exam  General:  Well-developed well-nourished in no acute distress. Skin is warm and dry. Head:  HEENT is normal. Neck:  supple with no bruits noted. No thyromegaly noted. Lungs:  clear to auscultation Heart:  regular rate and rhythm Abdomen:  soft and nontender. No masses palpated. No bruit noted. Extremities:  no edema. Neurologic:  grossly intact.   EKG  Procedure date:  04/03/2009  Findings:      Normal sinus rhythm at a rate of 68. No ST changes.  Impression & Recommendations:  Problem # 1:  CORONARY ARTERY DISEASE (ICD-414.00) No recent chest pain. We'll continue with aspirin and statin. We'll most likely repeat Myoview in one year. His updated medication list for this problem includes:    Baby Aspirin 81 Mg Chew (Aspirin) ..... Once daily  Problem # 2:  DYSPNEA (ICD-786.05) Tis not a new problem. It appears unchanged. It may be related to COPD and is tobacco abuse. His updated medication list for this problem includes:    Baby Aspirin 81 Mg Chew (Aspirin) ..... Once daily  His updated medication list for this problem includes:    Baby Aspirin 81 Mg Chew (Aspirin) ..... Once daily  Problem # 3:  HYPERLIPIDEMIA (ICD-272.4)  Ctinue statin. Lipids  and liver being monitored by his primary care physician. His updated medication list for this problem includes:    Simvastatin 40 Mg Tabs (Simvastatin) .[name]... Take one tablet at bedtime  His updated medication list for this problem includes:    Simvastatin 40 Mg Tabs (Simvastatin) .[name]... Take one tablet at bedtime  Problem # 4:  TOBACCO ABUSE (ICD-305.1) Patient counseled on discontinuing for between 3-10  minutes.  Other Orders: EKG w/ Interpretation (93000) Carotid Duplex (Carotid Duplex)  Patient Instructions: 1)  Your physician recommends that you schedule a follow-up appointment in:  12 MONTHS 2)  Your physician has requested that you have a carotid duplex. This test is an ultrasound of the carotid arteries in your neck. It looks at blood flow through these arteries that supply the brain with blood. Allow one hour for this exam. There are no restrictions or special instructions. AT PTS  CONVENIENCE

## 2010-12-02 NOTE — Progress Notes (Signed)
Summary: tried, chestpain   Phone Note Call from Patient Call back at Home Phone [phone]   Caller: Patient Reason for Call: Talk to Nurse Details for Reason: c/o tired, chestpain,  Initial call taken by: Lorne Skeens,  Mar 12, 2010 3:26 PM  Follow-up for Phone Call        spoke with pt, he is c/o tightness in his chest. he states it has been going on for about one week. he occ wakes with the tightness and it occs with any exerction. it has been occuring once daily but has now gotten more often. he also c/o fatique and a tingling in his left arm. he also has SOB with exerction. he is at rest now and has the tightness and the tingling in his arm. the pt has no NTG. pt states this is the same type of symptoms he had prior to his MI. pt refuses to go to the er, he wants to see dr Jens Som. will discuss with dr Jens Som Follow-up by: Deliah Goody, RN,  Mar 12, 2010 3:39 PM  Additional Follow-up for Phone Call Additional follow up Details #1::        discussed with dr Jens Som, pt advised to go to [place] for eval. pt voiced understanding. he will have his dtr drive him Deliah Goody, RN  Mar 12, 2010 4:16 PM

## 2010-12-02 NOTE — Assessment & Plan Note (Signed)
Summary: SKIN BX/MOLE-STC   Vital Signs:  Patient Profile:   65 Years Old Male Weight:      166 pounds Temp:     97.8 degrees F oral Pulse rate:   81 / minute BP sitting:   103 / 68  (left arm)  Vitals Entered By: Tora Perches (November 08, 2007 2:31 PM)                 Procedure Note  Cyst Removal: The patient complains of changing lesion. Indication: changing lesion Consent signed: yes  Procedure #1: shave bx    Size (in cm): 0.4 x 0.6    Region: medial R    Location: nose    Comment: Specimen otained and sent to lab. The rest - removed with a Hyfercator.    Instrument used: Dermablade    Anesthesia: 1% lidocaine w/epinephrine  Cleaned and prepped with: betadine Instructions: daily dressing changes Additional Instructions: Tol. well.    Chief Complaint:  skin bx.  History of Present Illness: Skin Bx.  Current Allergies: ! ZOCOR        Complete Medication List: 1)  Aspirin 81 Mg Tabs (Aspirin) .... Take one tablet once daily 2)  Temazepam 15 Mg Caps (Temazepam) .... Take 1-2 tablets as needed 3)  Viagra 100 Mg Tabs (Sildenafil citrate) .... Take 1 as needed and as directed 4)  Vitamin D3 1000 Unit Tabs (Cholecalciferol) .[name].. 1 qd 5)  Simvastatin 40 Mg Tabs (Simvastatin) .... Take one tablet at bedtime     ]

## 2010-12-02 NOTE — Assessment & Plan Note (Signed)
Summary: 2 WK ROV Natale Milch  $50   Vital Signs:  Patient Profile:   65 Years Old Male Weight:      167 pounds Temp:     97. degrees F oral Pulse rate:   56 / minute BP sitting:   118 / 73  (left arm)  Vitals Entered By: Tora Perches (Mar 06, 2008 10:13 AM)             Is Patient Diabetic? No     Chief Complaint:  Multiple medical problems or concerns.  History of Present Illness: F/u abd pain, bloating. He took Flagyl two times a day. He restarted Zocor and ASA. Back to nl now.    Current Allergies (reviewed today): ! ZOCOR  Past Medical History:    Reviewed history from 02/20/2008 and no changes required:       HYPERTENSION, MILD (ICD-401.1)       MYOCARDIAL INFARCTION (ICD-410.90)       GASTROESOPHAGEAL REFLUX DISEASE, HX OF (ICD-V12.79)       CHEST PAIN, ATYPICAL, HX OF (ICD-V15.89)       HYPERLIPIDEMIA (ICD-272.4)       CORONARY ARTERY DISEASE (ICD-414.00)               GERD   Family History:    Reviewed history from 10/18/2007 and no changes required:       Family History of CAD Male 1st degree relative <50  Social History:    Reviewed history from 10/18/2007 and no changes required:       Occupation:       Current Smoker       Married       Alcohol use-no     Physical Exam  General:     NAD Mouth:     Oral mucosa and oropharynx without lesions or exudates.  Teeth in good repair. Lungs:     Normal respiratory effort, chest expands symmetrically. Lungs are clear to auscultation, no crackles or wheezes. Heart:     Normal rate and regular rhythm. S1 and S2 normal without gallop, murmur, click, rub or other extra sounds. Abdomen:     Bowel sounds positive,abdomen soft and non-tender without masses, organomegaly or hernias noted. Skin:     Intact without suspicious lesions or rashes Psych:     Cognition and judgment appear intact. Alert and cooperative with normal attention span and concentration. No apparent delusions, illusions,  hallucinations    Impression & Recommendations:  Problem # 1:  ABDOMINAL PAIN (ICD-789.00) Resolved. The labs were reviewd with the patient. CT reviewed. Align x 2 wks  Problem # 2:  CORONARY ARTERY DISEASE (ICD-414.00) Assessment: Comment Only  His updated medication list for this problem includes:    Baby Aspirin 81 Mg Chew (Aspirin) ..... Once daily   Problem # 3:  HYPERLIPIDEMIA (ICD-272.4) On Rx now His updated medication list for this problem includes:    Simvastatin 40 Mg Tabs (Simvastatin) .[name]... Take one tablet at bedtime   Complete Medication List: 1)  Vitamin D3 1000 Unit Tabs (Cholecalciferol) .[name].. 1 qd 2)  Simvastatin 40 Mg Tabs (Simvastatin) .... Take one tablet at bedtime 3)  Protonix 40 Mg Tbec (Pantoprazole sodium) .[name].. 1 once daily 4)  Baby Aspirin 81 Mg Chew (Aspirin) .... Once daily   Patient Instructions: 1)  Please schedule a follow-up appointment in 4 months. 2)  Take Align 1 a day x 2 wks   ]

## 2010-12-02 NOTE — Letter (Signed)
Summary: Peripheral Vascular   [facility]  [address]   [place], [place] [address]   Phone: [phone]  Fax: [phone]     09/04/2010 MRN: [id number]  [facility] [address] [place], [place]  [address]  Dear Mr. RAVERT,   You are scheduled for Peripheral Vascular Angiogram on Wednesday September 17, 2010 with Dr. Excell Seltzer.  Please arrive at the [facility] at 8:00      a.m. on the day of your procedure.  1. DIET     _X___ Nothing to eat or drink after midnight except your medications with a sip of water.  2. MAKE SURE YOU TAKE YOUR ASPIRIN.  3. __X__ YOU MAY TAKE ALL of your remaining medications with a small amount of water.     4. Plan for one night stay - bring personal belongings (i.e. toothpaste, toothbrush, etc.)  5. Bring a current list of your medications and current insurance cards.  6. Must have a responsible person to drive you home.   7. Someone must be with you for the first 24 hours after you arrive home.  8. Please wear clothes that are easy to get on and off and wear slip-on shoes.  *Special note: Every effort is made to have your procedure done on time.  Occasionally there are emergencies that present themselves at the hospital that may cause delays.  Please be patient if a delay does occur.  If you have any questions after you get home, please call the office at the number listed above.   Julieta Gutting, RN, BSN

## 2010-12-02 NOTE — Miscellaneous (Signed)
Summary: Orders Update  Clinical Lists Changes  Problems: Added new problem of PVD (ICD-443.9) Orders: Added new Test order of Arterial Duplex Lower Extremity (Arterial Duplex Low) - Signed 

## 2010-12-02 NOTE — Assessment & Plan Note (Signed)
Summary: STOMACH PAIN/AWARE OF FEE/NML   Vital Signs:  Patient Profile:   65 Years Old Male Weight:      166 pounds Temp:     97.9 degrees F oral Pulse rate:   76 / minute BP sitting:   103 / 62  (left arm)  Vitals Entered By: Tora Perches (February 20, 2008 3:00 PM)             Is Patient Diabetic? No     Chief Complaint:  stomach pain.  History of Present Illness: C/o abd. bloating, burning pain in epig.area x 2 wks. C/o GERD. Worse after eating. No N/V/D.  Off Zocor x 2 wks    Current Allergies (reviewed today): ! ZOCOR  Past Medical History:    Reviewed history from 09/07/2007 and no changes required:       HYPERTENSION, MILD (ICD-401.1)       MYOCARDIAL INFARCTION (ICD-410.90)       GASTROESOPHAGEAL REFLUX DISEASE, HX OF (ICD-V12.79)       CHEST PAIN, ATYPICAL, HX OF (ICD-V15.89)       HYPERLIPIDEMIA (ICD-272.4)       CORONARY ARTERY DISEASE (ICD-414.00)               GERD   Family History:    Reviewed history from 10/18/2007 and no changes required:       Family History of CAD Male 1st degree relative <50  Social History:    Reviewed history from 10/18/2007 and no changes required:       Occupation:       Current Smoker       Married       Alcohol use-no    Review of Systems       The patient complains of abdominal pain and severe indigestion/heartburn.  The patient denies fever, weight loss, weight gain, chest pain, dyspnea on exhertion, melena, hematochezia, and hematuria.       Impression & Recommendations:  Problem # 1:  GERD (ICD-530.81) Assessment: Deteriorated  His updated medication list for this problem includes:    Protonix 40 Mg Tbec (Pantoprazole sodium) .[name]... 1 once daily   Problem # 2:  ABDOMINAL PAIN (ICD-789.00) and bloating Assessment: New Hold ASA and Zocor. Flagyl x 10 d CT abd. Treat #1 Orders: TLB-CBC Platelet - w/Differential (85025-CBCD) TLB-Udip ONLY (81003-UDIP) TLB-BMP (Basic Metabolic Panel-BMET)  (80048-METABOL) TLB-Hepatic/Liver Function Pnl (80076-HEPATIC) TLB-Amylase (82150-AMYL) TLB-Sedimentation Rate (ESR) (85651-ESR) Radiology Referral (Radiology)   Problem # 3:  HYPERTENSION, MILD (ICD-401.1) Assessment: Improved  Problem # 4:  CORONARY ARTERY DISEASE (ICD-414.00) Assessment: Unchanged  The following medications were removed from the medication list:    Aspirin 81 Mg Tabs (Aspirin) .[name]... Take one tablet once daily   Complete Medication List: 1)  Temazepam 15 Mg Caps (Temazepam) .... Take 1-2 tablets as needed 2)  Viagra 100 Mg Tabs (Sildenafil citrate) .... Take 1 as needed and as directed 3)  Vitamin D3 1000 Unit Tabs (Cholecalciferol) .[name].. 1 qd 4)  Simvastatin 40 Mg Tabs (Simvastatin) .... Take one tablet at bedtime 5)  Protonix 40 Mg Tbec (Pantoprazole sodium) .[name].. 1 once daily 6)  Metronidazole 250 Mg Tabs (Metronidazole) .[name].. 1 by mouth tid   Patient Instructions: 1)  Please schedule a follow-up appointment in 2 weeks.    Prescriptions: METRONIDAZOLE 250 MG  TABS (METRONIDAZOLE) 1 by mouth tid  #30 x 0   Entered and Authorized by:   Tresa Garter MD   Signed by:   Macarthur Critchley  V Plotnikov MD on 02/20/2008   Method used:   Print then Give to Patient   RxID:   [id number] PROTONIX 40 MG  TBEC (PANTOPRAZOLE SODIUM) 1 once daily  #30 x 3   Entered and Authorized by:   Tresa Garter MD   Signed by:   Tresa Garter MD on 02/20/2008   Method used:   Print then Give to Patient   RxID:   [id number]  ]

## 2010-12-02 NOTE — Assessment & Plan Note (Signed)
Summary: eph/jml  Medications Added NITROSTAT 0.4 MG SUBL (NITROGLYCERIN) 1 tablet under tongue at onset of chest pain; you may repeat every 5 minutes for up to 3 doses.        Visit Type:  EPH Primary Provider:  Tresa Garter MD  CC:  pt states he quit smoking again....sob when he walks fast..otherwise no other complaints today.  History of Present Illness: This is a 65 year old male patient who presented with 3 week history of exertional chest tightness. He had prior percutaneous intervention in the late 1990s involving stenting of a nondominant RCA and OM branch of the circumflex. Cardiac catheter April 03, 2010 revealed severe obtuse marginal stenosis with chronic total occlusion of previously stented segment, moderate in-stent restenosis of the nondominant right coronary artery at approximately 50%, patent LAD with mild luminal irregularities. He had unsuccessful percutaneous coronary intervention of the obtuse marginal due to inability for the balloon devices to cross the occlusion. Medical management was recommended.  The patient denies chest pain since he's been home. He can't walk much distance because of claudication symptoms and is scheduled for ABIs today. He quit smoking again but says this is very difficult for him especially when he sees other people smoking. He complains of being tired all the time and insomnia but these are chronic problems. He denies dyspnea and dyspnea on exertion dizziness or presyncope.  Current Medications (verified): 1)  Simvastatin 40 Mg Tabs (Simvastatin) .... Take One Tablet At Bedtime 2)  Baby Aspirin 81 Mg  Chew (Aspirin) .... Once Daily 3)  Vitamin D 1000 Unit Tabs (Cholecalciferol) .[name].. 1 By Mouth Qd 4)  Nitrostat 0.4 Mg Subl (Nitroglycerin) .[name].. 1 Tablet Under Tongue At Onset of Chest Pain; You May Repeat Every 5 Minutes For Up To 3 Doses.  Allergies: 1)  ! Zocor 2)  ! * Eggs  Past History:  Past Medical History: Last updated:  02/27/2010 HYPERTENSION, MILD (ICD-401.1) MYOCARDIAL INFARCTION (ICD-410.90) GASTROESOPHAGEAL REFLUX DISEASE, HX OF (ICD-V12.79) CHEST PAIN, ATYPICAL, HX OF (ICD-V15.89) HYPERLIPIDEMIA (ICD-272.4) CORONARY ARTERY DISEASE (ICD-414.00)   GERD Low back pain Osteoarthritis  Social History: Reviewed history from 02/27/2010 and no changes required. Occupation: retired Current Smokerquit 04/03/10 Married Alcohol use-no  Review of Systems       see history of present illness  Vital Signs:  Patient profile:   65 year old male Height:      65 inches Weight:      157 pounds BMI:     26.22 Pulse rate:   61 / minute Pulse rhythm:   regular BP sitting:   88 / 60  (left arm) Cuff size:   large  Vitals Entered By: Danielle Rankin, CMA (April 23, 2010 12:40 PM)  Physical Exam  General:   Well-nournished, in no acute distress. Neck: No JVD, HJR, Bruit, or thyroid enlargement Lungs: No tachypnea, clear without wheezing, rales, or rhonchi Cardiovascular: RRR, PMI not displaced, heart sounds normal, no murmurs, gallops, bruit, thrill, or heave. Abdomen: BS normal. Soft without organomegaly, masses, lesions or tenderness. Extremities: without cyanosis, clubbing or edema. decreased distal pulses bilateral SKin: Warm, no lesions or rashes  Musculoskeletal: No deformities Neuro: no focal signs    EKG  Procedure date:  04/23/2010  Findings:      normal sinus rhythm normal EKG  Impression & Recommendations:  Problem # 1:  CORONARY ARTERY DISEASE (ICD-414.00) Patient had unsuccessful percutaneous intervention of the total OM stenosis. He also has moderate 50% in-stent restenosis of the nondominant RCA,  patent LAD. Patient is doing well without symptoms. His updated medication list for this problem includes:    Baby Aspirin 81 Mg Chew (Aspirin) ..... Once daily    Nitrostat 0.4 Mg Subl (Nitroglycerin) .[name]... 1 tablet under tongue at onset of chest pain; you may repeat every 5 minutes for up  to 3 doses.  Orders: EKG w/ Interpretation (93000)  Problem # 2:  PVD (ICD-443.9) Patient complains of claudication symptoms. ABIs are being done now.  Problem # 3:  TOBACCO ABUSE (ICD-305.1) Patient said he did quit smoking but it is very difficult for him and he is unsure how long he can keep it up. I counseled him on the importance of smoking cessation and hope he can continue  Problem # 4:  HYPERLIPIDEMIA (ICD-272.4) treated. Wi'll need liver functions and LFTs in 4-6 weeks His updated medication list for this problem includes:    Simvastatin 40 Mg Tabs (Simvastatin) .[name]... Take one tablet at bedtime  Patient Instructions: 1)  Your physician recommends that you schedule a follow-up appointment in: 2 months with Dr. Jens Som 2)  Your physician recommends that you return for a FASTING lipid profile: Fasting Lipid profile, LFT in 4 to 6 weeks.272.0

## 2010-12-02 NOTE — Assessment & Plan Note (Signed)
Summary: 2 MTH FU   $50   STC-PER PT RS  COUGH $50  STC   Vital Signs:  Patient profile:   65 year old male Height:      65 inches Weight:      166 pounds BMI:     27.72 Temp:     97.2 degrees F oral Pulse rate:   76 / minute BP sitting:   122 / 72  (left arm)  Vitals Entered By: Tora Perches (February 12, 2009 3:34 PM) CC: f/u Is Patient Diabetic? No   CC:  f/u.  History of Present Illness: The patient presents with complaints of sore throat, fever, cough, sinus congestion and drainge of several days duration. Not better with OTC meds. Chest hurts with coughing. Can't sleep due to cough. Muscle aches are present.  The mucus is colored.   Current Medications (verified): 1)  Simvastatin 40 Mg Tabs (Simvastatin) .... Take One Tablet At Bedtime 2)  Baby Aspirin 81 Mg  Chew (Aspirin) .... Once Daily 3)  Vitamin D3 1000 Unit  Tabs (Cholecalciferol) .[name].. 1 Qd 4)  Triamcinolone Acetonide 0.5 % Crea (Triamcinolone Acetonide) .... Apply Bid To Affected Area  Allergies: 1)  ! Zocor 2)  ! * Eggs  Past History:  Past Medical History:    HYPERTENSION, MILD (ICD-401.1)    MYOCARDIAL INFARCTION (ICD-410.90)    GASTROESOPHAGEAL REFLUX DISEASE, HX OF (ICD-V12.79)    CHEST PAIN, ATYPICAL, HX OF (ICD-V15.89)    HYPERLIPIDEMIA (ICD-272.4)    CORONARY ARTERY DISEASE (ICD-414.00)         GERD     (02/20/2008)  Social History:    Reviewed history from 10/18/2007 and no changes required:       Occupation:       Current Smoker       Married       Alcohol use-no  Physical Exam  General:  Well-developed,well-nourished,in no acute distress; alert,appropriate and cooperative throughout examination Ears:  WNL Nose:  Erythematous throat mucosa and intranasal erythema.  Neck:  No deformities, masses, or tenderness noted. Lungs:  Normal respiratory effort, chest expands symmetrically. Lungs are clear to auscultation, no crackles or wheezes. Heart:  Normal rate and regular rhythm. S1 and S2  normal without gallop, murmur, click, rub or other extra sounds. Abdomen:  Bowel sounds positive,abdomen soft and non-tender without masses, organomegaly or hernias noted. Msk:  No deformity or scoliosis noted of thoracic or lumbar spine.   Extremities:  No clubbing, cyanosis, edema, or deformity noted with normal full range of motion of all joints.   Neurologic:  No cranial nerve deficits noted. Station and gait are normal. Plantar reflexes are down-going bilaterally. DTRs are symmetrical throughout. Sensory, motor and coordinative functions appear intact. Skin:  Intact without suspicious lesions or rashes Psych:  Cognition and judgment appear intact. Alert and cooperative with normal attention span and concentration. No apparent delusions, illusions, hallucinations   Impression & Recommendations:  Problem # 1:  ACUTE BRONCHITIS (ICD-466.0) Assessment New  His updated medication list for this problem includes:    Zithromax Z-pak 250 Mg Tabs (Azithromycin) ..... Use as directed    Promethazine-codeine 6.25-10 Mg/51ml Syrp (Promethazine-codeine) .[name]... Take 5-22mls by mouth every 6 hours as needed for cough    Albuterol Sulfate 4 Mg Tabs (Albuterol sulfate) .[name]... 1 by mouth two times a day as needed wheezing  Problem # 2:  VENOUS INSUFFICIENCY (ICD-459.81) Assessment: Improved  Problem # 3:  EDEMA (ICD-782.3) Assessment: Improved  Complete Medication List:  1)  Simvastatin 40 Mg Tabs (Simvastatin) .... Take one tablet at bedtime 2)  Baby Aspirin 81 Mg Chew (Aspirin) .... Once daily 3)  Vitamin D3 1000 Unit Tabs (Cholecalciferol) .[name].. 1 qd 4)  Triamcinolone Acetonide 0.5 % Crea (Triamcinolone acetonide) .... Apply bid to affected area 5)  Zithromax Z-pak 250 Mg Tabs (Azithromycin) .... Use as directed 6)  Promethazine-codeine 6.25-10 Mg/19ml Syrp (Promethazine-codeine) .... Take 5-68mls by mouth every 6 hours as needed for cough 7)  Albuterol Sulfate 4 Mg Tabs (Albuterol sulfate) .[name].. 1 by  mouth two times a day as needed wheezing  Patient Instructions: 1)  Call if you are not better in a reasonable ammount of time or if worse.  Prescriptions: ALBUTEROL SULFATE 4 MG TABS (ALBUTEROL SULFATE) 1 by mouth two times a day as needed wheezing  #60 x 0   Entered and Authorized by:   Tresa Garter MD   Signed by:   Tresa Garter MD on 02/12/2009   Method used:   Electronically to        [facility] (retail)       [address] [place], [place]  [address]       Ph: [phone]       Fax: [phone]   RxID:   [id number] ALBUTEROL SULFATE 4 MG TABS (ALBUTEROL SULFATE) 1 by mouth two times a day as needed wheezing  #60 x 0   Entered and Authorized by:   Tresa Garter MD   Signed by:   Tresa Garter MD on 02/12/2009   Method used:   Print then Give to Patient   RxID:   [id number] PROMETHAZINE-CODEINE 6.25-10 MG/5ML  SYRP (PROMETHAZINE-CODEINE) Take 5-58mls by mouth every 6 hours as needed for cough  #352mls x 0   Entered and Authorized by:   Tresa Garter MD   Signed by:   Tresa Garter MD on 02/12/2009   Method used:   Print then Give to Patient   RxID:   [id number] FIEPPIRJJ Z-PAK 250 MG  TABS (AZITHROMYCIN) Use as directed  #1 x 0   Entered and Authorized by:   Tresa Garter MD   Signed by:   Tresa Garter MD on 02/12/2009   Method used:   Electronically to        [facility] (retail)       [address] [place], [place]  [address]       Ph: [phone]       Fax: [phone]   RxID:   [id number]

## 2010-12-02 NOTE — Miscellaneous (Signed)
Summary: [facility] Progress Note   [facility] Note   Imported By: Roderic Ovens 05/13/2010 15:11:42  _____________________________________________________________________  External Attachment:    Type:   Image     Comment:   External Document

## 2010-12-02 NOTE — Assessment & Plan Note (Signed)
Summary: LEGS ARE SWELLING /NWS   Vital Signs:  Patient Profile:   65 Years Old Male Weight:      171 pounds Temp:     97.3 degrees F oral Pulse rate:   68 / minute BP sitting:   126 / 70  (left arm)  Vitals Entered By: Tora Perches (December 19, 2008 3:56 PM)                 Chief Complaint:  Multiple medical problems or concerns.  History of Present Illness: C/o L LE swelling and itching x months, dry rash x  months    Prior Medications Reviewed Using: Patient Recall  Updated Prior Medication List: SIMVASTATIN 40 MG TABS (SIMVASTATIN) Take one tablet at bedtime BABY ASPIRIN 81 MG  CHEW (ASPIRIN) once daily VITAMIN D3 1000 UNIT  TABS (CHOLECALCIFEROL) 1 qd  Current Allergies (reviewed today): ! ZOCOR ! * EGGS   Family History:    Reviewed history from 10/18/2007 and no changes required:       Family History of CAD Male 1st degree relative <50  Social History:    Reviewed history from 10/18/2007 and no changes required:       Occupation:       Current Smoker       Married       Alcohol use-no     Physical Exam  General:     Well-developed,well-nourished,in no acute distress; alert,appropriate and cooperative throughout examination Nose:     External nasal examination shows no deformity or inflammation. Nasal mucosa are pink and moist without lesions or exudates. Mouth:     Oral mucosa and oropharynx without lesions or exudates.  Teeth in good repair. Neck:     No deformities, masses, or tenderness noted. Lungs:     Normal respiratory effort, chest expands symmetrically. Lungs are clear to auscultation, no crackles or wheezes. Heart:     Normal rate and regular rhythm. S1 and S2 normal without gallop, murmur, click, rub or other extra sounds. Abdomen:     Bowel sounds positive,abdomen soft and non-tender without masses, organomegaly or hernias noted. Msk:     No deformity or scoliosis noted of thoracic or lumbar spine.   Extremities:     trace  left pedal edema and trace right pedal edema.   Neurologic:     No cranial nerve deficits noted. Station and gait are normal. Plantar reflexes are down-going bilaterally. DTRs are symmetrical throughout. Sensory, motor and coordinative functions appear intact. Skin:     Hyperpigm. skin on shins Psych:     Cognition and judgment appear intact. Alert and cooperative with normal attention span and concentration. No apparent delusions, illusions, hallucinations    Impression & Recommendations:  Problem # 1:  EDEMA (ICD-782.3) LLE Assessment: Deteriorated Triamc bid  Problem # 2:  VENOUS INSUFFICIENCY (ICD-459.81) causing #1 Assessment: Deteriorated  Complete Medication List: 1)  Simvastatin 40 Mg Tabs (Simvastatin) .... Take one tablet at bedtime 2)  Baby Aspirin 81 Mg Chew (Aspirin) .... Once daily 3)  Vitamin D3 1000 Unit Tabs (Cholecalciferol) .[name].. 1 qd 4)  Triamcinolone Acetonide 0.5 % Crea (Triamcinolone acetonide) .... Apply bid to affected area   Patient Instructions: 1)  Elevate leg 15 min 4 times a day 2)  Compression socks 3)  Please schedule a follow-up appointment in 2 months.   Prescriptions: SIMVASTATIN 40 MG TABS (SIMVASTATIN) Take one tablet at bedtime  #30 x 12   Entered and Authorized by:   Macarthur Critchley  Buckner Malta MD   Signed by:   Tresa Garter MD on 12/19/2008   Method used:   Electronically to        [facility] (retail)       [address] [place], [place]  [address]       Ph: [phone]       Fax: [phone]   RxID:   [id number] TRIAMCINOLONE ACETONIDE 0.5 % CREA (TRIAMCINOLONE ACETONIDE) apply bid to affected area  #120 g x 3   Entered and Authorized by:   Tresa Garter MD   Signed by:   Tresa Garter MD on 12/19/2008   Method used:   Electronically to        [facility] (retail)       [address] [place], [place]  [address]        Ph: [phone]       Fax: [phone]   RxID:   [id number]

## 2010-12-02 NOTE — Letter (Signed)
Summary: Custom - Lipid  [facility], Main Office  [address]   [place], [place] [address]   Phone: [phone]  Fax: [phone]     August 06, 2010 MRN: [id number]   [facility] [address] [place], [place]  [address]   Dear Mr. EGELSTON,  We have reviewed your cholesterol results.  They are as follows:     Total Cholesterol:    197 (Desirable: less than 200)       HDL  Cholesterol:     42.80  (Desirable: greater than 40 for men and 50 for women)       LDL Cholesterol:       125  (Desirable: less than 100 for low risk and less than 70 for moderate to high risk)       Triglycerides:       145.0  (Desirable: less than 150)  Our recommendations include:These numbers are alittle higher than we would like. Please give me a call so we can discuss changing your medicine. Take care, Dr. Darel Hong   Call our office at the number listed above if you have any questions.  Lowering your LDL cholesterol is important, but it is only one of a large number of "risk factors" that may indicate that you are at risk for heart disease, stroke or other complications of hardening of the arteries.  Other risk factors include:   A.  Cigarette Smoking* B.  High Blood Pressure* C.  Obesity* D.   Low HDL Cholesterol (see yours above)* E.   Diabetes Mellitus (higher risk if your is uncontrolled) F.  Family history of premature heart disease G.  Previous history of stroke or cardiovascular disease    *These are risk factors YOU HAVE CONTROL OVER.  For more information, visit .  There is now evidence that lowering the TOTAL CHOLESTEROL AND LDL CHOLESTEROL can reduce the risk of heart disease.  The American Heart Association recommends the following guidelines for the treatment of elevated cholesterol:  1.  If there is now current heart disease and less than two risk factors, TOTAL CHOLESTEROL should be less than 200 and LDL CHOLESTEROL should be less than 100. 2.  If  there is current heart disease or two or more risk factors, TOTAL CHOLESTEROL should be less than 200 and LDL CHOLESTEROL should be less than 70.  A diet low in cholesterol, saturated fat, and calories is the cornerstone of treatment for elevated cholesterol.  Cessation of smoking and exercise are also important in the management of elevated cholesterol and preventing vascular disease.  Studies have shown that 30 to 60 minutes of physical activity most days can help lower blood pressure, lower cholesterol, and keep your weight at a healthy level.  Drug therapy is used when cholesterol levels do not respond to therapeutic lifestyle changes (smoking cessation, diet, and exercise) and remains unacceptably high.  If medication is started, it is important to have you levels checked periodically to evaluate the need for further treatment options.  Thank you,    [employer] Team

## 2010-12-02 NOTE — Miscellaneous (Signed)
  Clinical Lists Changes  Observations: Added new observation of CARDCATHFIND: 1. Severe obtuse marginal stenosis with chronic total occlusion of     previously stented segment. 2. Moderate in-stent restenosis of the nondominant right coronary     artery. 3. Patent left anterior descending/diagonal with mild luminal     irregularities. 4. Unsuccessful percutaneous coronary intervention due to inability     for balloon devices to cross the occlusion.   RECOMMENDATIONS:  The patient can be managed medically.  His OM branch is small and he should tolerate the medical therapy.  I think further attempts at reopening this OM would likely be unsuccessful. (04/04/2010 9:31) Added new observation of US CAROTID: Stable, mild plaque, bilaterally 0-39% bilateral ICA stenosis  f/u 2 years (04/12/2009 9:30)      Carotid Doppler  Procedure date:  04/12/2009  Findings:      Stable, mild plaque, bilaterally 0-39% bilateral ICA stenosis  f/u 2 years  Cardiac Cath  Procedure date:  04/04/2010  Findings:      1. Severe obtuse marginal stenosis with chronic total occlusion of     previously stented segment. 2. Moderate in-stent restenosis of the nondominant right coronary     artery. 3. Patent left anterior descending/diagonal with mild luminal     irregularities. 4. Unsuccessful percutaneous coronary intervention due to inability     for balloon devices to cross the occlusion.   RECOMMENDATIONS:  The patient can be managed medically.  His OM branch is small and he should tolerate the medical therapy.  I think further attempts at reopening this OM would likely be unsuccessful.

## 2010-12-02 NOTE — Assessment & Plan Note (Signed)
Summary: f15m   Visit Type:  Follow-up Primary Provider:  Tresa Garter MD  CC:  Bilateral leg pain.  History of Present Illness: 65 year-old male presenting for follouwp of lower extremity PAD. He was seen about 3 months ago and started on a trial of pletal for treatment of bilateral leg claudication. Reports no improvement in symptoms. He has tried an exercise program but can only ride a bicycle for 3 minutes or walk 3-4 blocks. Symptoms are worse in the left leg. The pain starts in the calf muscles and radiates up to the thighs and hips. No rest pain or ulcerations. No exertional chest pain or dyspnea. He does some angina with emotional stress and this responds well to NTG.  Current Medications (verified): 1)  Crestor 20 Mg Tabs (Rosuvastatin Calcium) .[name].. 1 Once Daily 2)  Baby Aspirin 81 Mg  Chew (Aspirin) .... Once Daily 3)  Nitrostat 0.4 Mg Subl (Nitroglycerin) .[name].. 1 Tablet Under Tongue At Onset of Chest Pain; You May Repeat Every 5 Minutes For Up To 3 Doses. 4)  Pletal 100 Mg Tabs (Cilostazol) .... Take One Tablet By Mouth Once Daily  Allergies: 1)  ! * Eggs  Past History:  Past medical history reviewed for relevance to current acute and chronic problems.  Past Medical History: Reviewed history from 06/30/2010 and no changes required. HYPERTENSION, MILD (ICD-401.1) MYOCARDIAL INFARCTION (ICD-410.90) GASTROESOPHAGEAL REFLUX DISEASE, HX OF (ICD-V12.79) CHEST PAIN, ATYPICAL, HX OF (ICD-V15.89) HYPERLIPIDEMIA (ICD-272.4) CORONARY ARTERY DISEASE (ICD-414.00) Peripheral arterial disease with ABI's 0.8 on the right and 0.86 on the left GERD Low back pain Osteoarthritis  Review of Systems       Negative except as per HPI   Vital Signs:  Patient profile:   65 year old male Height:      65 inches Weight:      162 pounds BMI:     27.06 Pulse rate:   61 / minute Pulse rhythm:   regular Resp:     18 per minute BP sitting:   130 / 72  (left arm) Cuff size:    large  Vitals Entered By: Vikki Ports (September 04, 2010 9:10 AM)  Serial Vital Signs/Assessments:  Time      Position  BP       Pulse  Resp  Temp     By           R Arm     125/72                         Vikki Ports   Physical Exam  General:  Pt is alert and oriented, in no acute distress. HEENT: normal Neck: normal carotid upstrokes without bruits, JVP normal Lungs: CTA CV: RRR without murmur or gallop Abd: soft, NT, positive BS, no bruit, no organomegaly Ext: no clubbing, cyanosis, or edema. Left femoral bruit, pedal pulses 1+ right and trace on left Skin: warm and dry without rash    Impression & Recommendations:  Problem # 1:  ATHEROSCLEROSIS W /INT CLAUDICATION (ICD-440.21) Noninvasive studies reviewed from earlier this year. The patient has not improved with medical therapy and has a strong desire to exercise and be more active. He has multilevel disease on the left with a suggestion of inflow disease, SFA stenosis, and tibial disease. Recommend angiography with an eye toward PTA. Discussed risks, indication, and alternatives, and the patient agrees to proceed.   Orders: PV Procedure (PV Procedure)  Problem #  2:  CORONARY ARTERY DISEASE (ICD-414.00) Stable, Class 2 Angina.  His updated medication list for this problem includes:    Baby Aspirin 81 Mg Chew (Aspirin) ..... Once daily    Nitrostat 0.4 Mg Subl (Nitroglycerin) .[name]... 1 tablet under tongue at onset of chest pain; you may repeat every 5 minutes for up to 3 doses.    Pletal 100 Mg Tabs (Cilostazol) .[name]... Take one tablet by mouth once daily  Patient Instructions: 1)  Your physician has requested that you have a peripheral vascular angiogram. This exam is performed at the hospital. During this exam IV contrast is used to look at arterial blood flow.  Please review the information sheet given for details. 2)  Your physician recommends that you continue on your current medications as directed. Please refer to  the Current Medication list given to you today. 3)  Your physician recommends that you schedule a follow-up appointment in: 6 WEEKS

## 2010-12-02 NOTE — Letter (Signed)
Summary: Cardiac Rehabilitation Program  Cardiac Rehabilitation Program   Imported By: Debby Freiberg 04/30/2010 13:50:35  _____________________________________________________________________  External Attachment:    Type:   Image     Comment:   External Document

## 2010-12-02 NOTE — Assessment & Plan Note (Signed)
Summary: PLACE ON CHEST/ FLU SHOT /NWS $50   Vital Signs:  Patient Profile:   65 Years Old Male Weight:      165 pounds Temp:     97.5 degrees F oral Pulse rate:   72 / minute BP sitting:   114 / 78  (left arm)  Vitals Entered By: Tora Perches (August 20, 2008 10:37 AM)                 Procedure Note  Mole Biopsy/Removal: The patient complains of pain, irritation, and inflammation. Onset of lesion: 2 months Indication: changing lesion  Procedure # 1: shave biopsy    Size (in cm): 0.6 x 0.4    Region: medial    Location: chest    Instrument used: #10 blade  Cleaned and prepped with: betadine Wound dressing: neosporin Instructions: daily dressing changes   Chief Complaint:  Multiple medical problems or concerns.  History of Present Illness: The patient presents for a follow up of hypertension, CAD, hyperlipidemia. F/u abd pain: better since stopped coffee.. C/o place on chest growing     Current Allergies (reviewed today): ! ZOCOR ! * EGGS  Past Medical History:    Reviewed history from 02/20/2008 and no changes required:       HYPERTENSION, MILD (ICD-401.1)       MYOCARDIAL INFARCTION (ICD-410.90)       GASTROESOPHAGEAL REFLUX DISEASE, HX OF (ICD-V12.79)       CHEST PAIN, ATYPICAL, HX OF (ICD-V15.89)       HYPERLIPIDEMIA (ICD-272.4)       CORONARY ARTERY DISEASE (ICD-414.00)               GERD   Family History:    Reviewed history from 10/18/2007 and no changes required:       Family History of CAD Male 1st degree relative <50  Social History:    Reviewed history from 10/18/2007 and no changes required:       Occupation:       Current Smoker       Married       Alcohol use-no    Review of Systems  The patient denies fever, weight gain, chest pain, dyspnea on exertion, and abdominal pain.     Physical Exam  General:     Well-developed,well-nourished,in no acute distress; alert,appropriate and cooperative throughout examination Ears:    External ear exam shows no significant lesions or deformities.  Otoscopic examination reveals clear canals, tympanic membranes are intact bilaterally without bulging, retraction, inflammation or discharge. Hearing is grossly normal bilaterally. Mouth:     Oral mucosa and oropharynx without lesions or exudates.  Teeth in good repair. Neck:     No deformities, masses, or tenderness noted. Lungs:     Normal respiratory effort, chest expands symmetrically. Lungs are clear to auscultation, no crackles or wheezes. Heart:     Normal rate and regular rhythm. S1 and S2 normal without gallop, murmur, click, rub or other extra sounds. Abdomen:     Bowel sounds positive,abdomen soft and non-tender without masses, organomegaly or hernias noted. Msk:     No deformity or scoliosis noted of thoracic or lumbar spine.   Skin:     6 mm hard mole o chest Psych:     Cognition and judgment appear intact. Alert and cooperative with normal attention span and concentration. No apparent delusions, illusions, hallucinations    Impression & Recommendations:  Problem # 1:  NEOPLASM OF UNCERTAIN BEHAVIOR OF SKIN (ICD-238.2) Assessment:  New Will remove Orders: Shave Skin Lesion 0.6-1.0 cm/trunk/arm/leg (11301)   Problem # 2:  CORONARY ARTERY DISEASE (ICD-414.00)  His updated medication list for this problem includes:    Baby Aspirin 81 Mg Chew (Aspirin) ..... Once daily   Problem # 3:  ABDOMINAL PAIN (ICD-789.00), resolved Assessment: Comment Only  His updated medication list for this problem includes:    Metoclopramide Hcl 10 Mg Tabs (Metoclopramide hcl) .[name]... 1 by mouth three times a day ac   Complete Medication List: 1)  Simvastatin 40 Mg Tabs (Simvastatin) .... Take one tablet at bedtime 2)  Protonix 40 Mg Tbec (Pantoprazole sodium) .[name].. 1 once daily 3)  Baby Aspirin 81 Mg Chew (Aspirin) .... Once daily 4)  Vitamin D3 1000 Unit Tabs (Cholecalciferol) .[name].. 1 qd 5)  Metoclopramide Hcl 10 Mg Tabs  (Metoclopramide hcl) .[name].. 1 by mouth three times a day ac  Other Orders: Future Orders: Admin 1st Vaccine (52841) ... 08/21/2008 Flu Vaccine 34yrs + (32440) ... 08/21/2008   Patient Instructions: 1)  Please schedule a follow-up appointment in 4 months. 2)  BMP prior to visit, ICD-9: 414.8 3)  Hepatic Panel prior to visit, ICD-9: 4)  Lipid Panel prior to visit, ICD-9: 5)  TSH prior to visit, ICD-9:   ]  Influenza Vaccine (to be given today)         Flu Vaccine Consent Questions     Do you have a history of severe allergic reactions to this vaccine? no    Any prior history of allergic reactions to egg and/or gelatin? no    Do you have a sensitivity to the preservative Thimersol? no    Do you have a past history of Guillan-Barre Syndrome? no    Do you currently have an acute febrile illness? no    Have you ever had a severe reaction to latex? no    Vaccine information given and explained to patient? yes    Are you currently pregnant? no    Lot Number:[id number]   Exp Date:05/01/2009   Site Given  Left Deltoid IMpcflu

## 2010-12-02 NOTE — Assessment & Plan Note (Signed)
Summary: per check out/sf   Visit Type:  Follow-up Primary Provider:  Tresa Garter MD  CC:  chest pain.  History of Present Illness: Pleasant male that I have seen in the past for coronary disease. He has a history of a prior stent to a small nondominant right coronary artery in [place] in 1997 per old notes. His last catheterization was performed in June of 2011. At that time he had  a 50% in-stent restenosis of the right coronary artery. There was a 30-40% LAD lesion.The first OM is subtotally occluded inside of a previously placed stent.  Attempt at PCI of the small marginal was unsuccessful and medical therapy was recommended. He also has recently seen Dr. Excell Seltzer for claudication. He is being treated medically with possible angiogram in the future if his symptoms do not improve. Last carotid Dopplers in June of 2010 showed 0-39% stenosis bilaterally. Followup recommended in 2 years. Since I last saw him,  he denies dyspnea on exertion, orthopnea, PND, pedal edema, palpitations or syncope. He occasionally feels chest tightness when he is "stressed". This does not occur with exertion. It resolved with nitroglycerin. There is no associated symptoms.  Current Medications (verified): 1)  Simvastatin 40 Mg Tabs (Simvastatin) .... Take One Tablet At Bedtime 2)  Baby Aspirin 81 Mg  Chew (Aspirin) .... Once Daily 3)  Nitrostat 0.4 Mg Subl (Nitroglycerin) .[name].. 1 Tablet Under Tongue At Onset of Chest Pain; You May Repeat Every 5 Minutes For Up To 3 Doses. 4)  Pletal 100 Mg Tabs (Cilostazol) .... Take One Tablet By Mouth Once Daily  Allergies (verified): 1)  ! * Eggs  Past History:  Family History: Last updated: 10/18/2007 Family History of CAD Male 1st degree relative <50  Social History: Last updated: 04/23/2010 Occupation: retired Current Smokerquit 04/03/10 Married Alcohol use-no  Risk Factors: Smoking Status: current (10/18/2007)  Past Medical History: HYPERTENSION, MILD  (ICD-401.1) MYOCARDIAL INFARCTION (ICD-410.90) GASTROESOPHAGEAL REFLUX DISEASE, HX OF (ICD-V12.79) CHEST PAIN, ATYPICAL, HX OF (ICD-V15.89) HYPERLIPIDEMIA (ICD-272.4) CORONARY ARTERY DISEASE (ICD-414.00) Peripheral arterial disease with ABI's 0.8 on the right and 0.86 on the left GERD Low back pain Osteoarthritis  Past Surgical History: Reviewed history from 09/07/2007 and no changes required. ARTHROSCOPY, KNEE, HX OF (ICD-V45.89) PERCUTANEOUS TRANSLUMINAL CORONARY ANGIOPLASTY, HX OF (ICD-V45.82) * BACK SURGERY APPENDECTOMY, HX OF (ICD-V45.79)  Social History: Reviewed history from 04/23/2010 and no changes required. Occupation: retired Current Smokerquit 04/03/10 Married Alcohol use-no  Review of Systems       Problems with claudication but no fevers or chills, productive cough, hemoptysis, dysphasia, odynophagia, melena, hematochezia, dysuria, hematuria, rash, seizure activity, orthopnea, PND, pedal edema. Remaining systems are negative.   Vital Signs:  Patient profile:   65 year old male Height:      65 inches Weight:      159.50 pounds BMI:     26.64 Pulse rate:   72 / minute BP sitting:   112 / 60  (left arm) Cuff size:   regular  Physical Exam  General:  Well-developed well-nourished in no acute distress.  Skin is warm and dry.  HEENT is normal.  Neck is supple. No thyromegaly.  Chest is clear to auscultation with normal expansion.  Cardiovascular exam is regular rate and rhythm.  Abdominal exam nontender or distended. No masses palpated. Extremities show no edema. neuro grossly intact    EKG  Procedure date:  06/30/2010  Findings:      Sinus rhythm at a rate of 72. Axis normal.  RV conduction delay. No ST changes.  Impression & Recommendations:  Problem # 1:  ATHEROSCLEROSIS W /INT CLAUDICATION (ICD-440.21) Continued aspirin, Pletal, and exercise. Patient has Dc'ed tobacoo. Followup Dr. Excell Seltzer. May need angiogram in the future.  Problem # 2:   CAROTID ARTERY DISEASE (ICD-433.10) Continue aspirin and statin. Followup carotid Dopplers June 2012. His updated medication list for this problem includes:    Baby Aspirin 81 Mg Chew (Aspirin) ..... Once daily    Pletal 100 Mg Tabs (Cilostazol) .[name]... Take one tablet by mouth once daily  Orders: EKG w/ Interpretation (93000)  Problem # 3:  TOBACCO ABUSE (ICD-305.1) Now discontinued.  Problem # 4:  CORONARY ARTERY DISEASE (ICD-414.00) Continue aspirin and statin. He has had problems with atypical chest pain in the past. His updated medication list for this problem includes:    Baby Aspirin 81 Mg Chew (Aspirin) ..... Once daily    Nitrostat 0.4 Mg Subl (Nitroglycerin) .[name]... 1 tablet under tongue at onset of chest pain; you may repeat every 5 minutes for up to 3 doses.    Pletal 100 Mg Tabs (Cilostazol) .[name]... Take one tablet by mouth once daily  Problem # 5:  HYPERLIPIDEMIA (ICD-272.4) Continue statin. Check lipids and liver. His updated medication list for this problem includes:    Simvastatin 40 Mg Tabs (Simvastatin) .[name]... Take one tablet at bedtime  Patient Instructions: 1)  Your physician recommends that you schedule a follow-up appointment in: 6 months with Dr. Jens Som 2)  Your physician recommends that you continue on your current medications as directed. Please refer to the Current Medication list given to you today. 3)  Your physician recommends that you return for a FASTING lipid profile and liver on FRIDAY SEPTEMBER 9,2011 AT 9:00AM

## 2010-12-02 NOTE — Assessment & Plan Note (Signed)
Summary: FU-STC  Medications Added VIAGRA 100 MG  TABS (SILDENAFIL CITRATE) Take 1 as needed and as directed VITAMIN D3 1000 UNIT  TABS (CHOLECALCIFEROL) 1 qd        Vital Signs:  Patient Profile:   65 Years Old Male Weight:      167 pounds Temp:     96.8 degrees F oral Pulse rate:   66 / minute BP sitting:   116 / 74  (left arm)  Vitals Entered By: Tora Perches (October 18, 2007 10:37 AM)             Is Patient Diabetic? No     Chief Complaint:  Multiple medical problems or concerns.  History of Present Illness: The patient presents for a wellness examination. Needs a Hep. B shot #2   Current Allergies: ! ZOCOR  Past Medical History:    Reviewed history from 09/07/2007 and no changes required:       HYPERTENSION, MILD (ICD-401.1)       MYOCARDIAL INFARCTION (ICD-410.90)       GASTROESOPHAGEAL REFLUX DISEASE, HX OF (ICD-V12.79)       CHEST PAIN, ATYPICAL, HX OF (ICD-V15.89)       HYPERLIPIDEMIA (ICD-272.4)       CORONARY ARTERY DISEASE (ICD-414.00)          Past Surgical History:    Reviewed history from 09/07/2007 and no changes required:       ARTHROSCOPY, KNEE, HX OF (ICD-V45.89)       PERCUTANEOUS TRANSLUMINAL CORONARY ANGIOPLASTY, HX OF (ICD-V45.82)       * BACK SURGERY       APPENDECTOMY, HX OF (ICD-V45.79)   Family History:    Family History of CAD Male 1st degree relative <50  Social History:    Occupation:    Current Smoker    Married    Alcohol use-no   Risk Factors:  Tobacco use:  current    Counseled to quit/cut down tobacco use:  yes Alcohol use:  no   Review of Systems  The patient denies anorexia, fever, weight loss, chest pain, syncope, dyspnea on exhertion, abdominal pain, and melena.         Insomnia, sleeps 4-5 hrs; tired   Physical Exam  General:     Well-developed,well-nourished,in no acute distress; alert,appropriate and cooperative throughout examination Head:     Normocephalic and atraumatic without obvious  abnormalities. No apparent alopecia or balding. Eyes:     No corneal or conjunctival inflammation noted. EOMI. Perrla. Funduscopic exam benign, without hemorrhages, exudates or papilledema. Vision grossly normal. Ears:     External ear exam shows no significant lesions or deformities.  Otoscopic examination reveals clear canals, tympanic membranes are intact bilaterally without bulging, retraction, inflammation or discharge. Hearing is grossly normal bilaterally. Nose:     External nasal examination shows no deformity or inflammation. Nasal mucosa are pink and moist without lesions or exudates. Mouth:     Oral mucosa and oropharynx without lesions or exudates.  Teeth in good repair. Neck:     No deformities, masses, or tenderness noted. Lungs:     Normal respiratory effort, chest expands symmetrically. Lungs are clear to auscultation, no crackles or wheezes. Heart:     Normal rate and regular rhythm. S1 and S2 normal without gallop, murmur, click, rub or other extra sounds. Abdomen:     Bowel sounds positive,abdomen soft and non-tender without masses, organomegaly or hernias noted. Rectal:     No external abnormalities noted. Normal sphincter  tone. No rectal masses or tenderness. Genitalia:     Testes bilaterally descended without nodularity, tenderness or masses. No scrotal masses or lesions. No penis lesions or urethral discharge. Prostate:     Prostate gland firm and smooth, no enlargement, nodularity, tenderness, mass, asymmetry or induration. Msk:     No deformity or scoliosis noted of thoracic or lumbar spine.   Extremities:     No clubbing, cyanosis, edema, or deformity noted with normal full range of motion of all joints.   Neurologic:     No cranial nerve deficits noted. Station and gait are normal. Plantar reflexes are down-going bilaterally. DTRs are symmetrical throughout. Sensory, motor and coordinative functions appear intact. Skin:     Mole on the nose 6x4 mm Psych:      Cognition and judgment appear intact. Alert and cooperative with normal attention span and concentration. No apparent delusions, illusions, hallucinations    Impression & Recommendations:  Problem # 1:  WELL ADULT EXAM (ICD-V70.0) Colonosc. w/ Dr Russella Dar. Zostavax info. Hep B 2nd shot Orders: TLB-BMP (Basic Metabolic Panel-BMET) (80048-METABOL) TLB-CBC Platelet - w/Differential (85025-CBCD) TLB-Hepatic/Liver Function Pnl (80076-HEPATIC) TLB-Lipid Panel (80061-LIPID) TLB-PSA (Prostate Specific Antigen) (84153-PSA) TLB-TSH (Thyroid Stimulating Hormone) (84443-TSH) TLB-Udip ONLY (81003-UDIP) Gastroenterology Referral (GI)   Problem # 2:  NEOPLASM OF UNCERTAIN BEHAVIOR OF SKIN (ICD-238.2) On the nose 6x4 mm. Schedule bx  Problem # 3:  COUGH (ICD-786.2)  Orders: T-2 View CXR (71020TC)   Problem # 4:  CORONARY ARTERY DISEASE (ICD-414.00) Labs. F/u with Dr Jens Som q 6 mo His updated medication list for this problem includes:    Aspirin 81 Mg Tabs (Aspirin) .[name]... Take one tablet once daily   Complete Medication List: 1)  Aspirin 81 Mg Tabs (Aspirin) .... Take one tablet once daily 2)  Temazepam 15 Mg Caps (Temazepam) .... Take 1-2 tablets as needed 3)  Viagra 100 Mg Tabs (Sildenafil citrate) .... Take 1 as needed and as directed 4)  Vitamin D3 1000 Unit Tabs (Cholecalciferol) .[name].. 1 qd  Other Orders: Hepatitis B Vaccine >2yrs (45409) Admin 1st Vaccine (81191)   Patient Instructions: 1)  Please schedule a follow-up appointment in 6 months. 2)  Tobacco is very bad for your health and your loved ones! You Should stop smoking!. 3)  Schedule a colonoscopy/sigmoidoscopy to help detect colon cancer.    Prescriptions: VIAGRA 100 MG  TABS (SILDENAFIL CITRATE) Take 1 as needed and as directed  #12 x 6   Entered and Authorized by:   Tresa Garter MD   Signed by:   Tresa Garter MD on 10/18/2007   Method used:   Print then Give to Patient   RxID:    [id number] TEMAZEPAM 15 MG  CAPS (TEMAZEPAM) Take 1-2 tablets as needed  #60 x 6   Entered and Authorized by:   Tresa Garter MD   Signed by:   Tresa Garter MD on 10/18/2007   Method used:   Print then Give to Patient   RxID:   [id number]  ]  Hepatitis B Vaccine # 2    Vaccine Type: HepB Adult    Site: left deltoid    Mfr: GlaxoSmithK[employer]    Dose: 1.0 ml    Route: IM    Given by: Tora Perches    Exp. Date: 04/01/2009    Lot #: [id number]    VIS given: 05/12/00 version given October 18, 2007.   Orders Added: 1)  Est. Patient age 32-64 [99396] 2)  TLB-BMP (Basic  Metabolic Panel-BMET) [80048-METABOL] 3)  TLB-CBC Platelet - w/Differential [85025-CBCD] 4)  TLB-Hepatic/Liver Function Pnl [80076-HEPATIC] 5)  TLB-Lipid Panel [80061-LIPID] 6)  TLB-PSA (Prostate Specific Antigen) [84153-PSA] 7)  TLB-TSH (Thyroid Stimulating Hormone) [84443-TSH] 8)  TLB-Udip ONLY [81003-UDIP] 9)  T-2 View CXR [71020TC] 10)  Gastroenterology Referral [GI] 11)  Hepatitis B Vaccine >47yrs [90746] 12)  Admin 1st Vaccine [40981]

## 2010-12-02 NOTE — Assessment & Plan Note (Signed)
Summary: BACK PAIN/NWS   Vital Signs:  Patient profile:   65 year old male Height:      65 inches (165.10 cm) Weight:      159.38 pounds (72.45 kg) BMI:     26.62 O2 Sat:      956 % Temp:     96.3 degrees F (35.72 degrees C) oral Pulse rate:   63 / minute Pulse rhythm:   regular BP sitting:   90 / 64  (left arm) Cuff size:   regular  Vitals Entered By: Lucious Groves (February 27, 2010 8:16 AM) CC: pt c/o back pain and pain in legs x 1 week/pt also states that he has numbness in toes/pt feels pain when getting up from sitting position and walking/sharp pain at onset 8/10/aj   Primary Care Provider:  Tresa Garter MD  CC:  pt c/o back pain and pain in legs x 1 week/pt also states that he has numbness in toes/pt feels pain when getting up from sitting position and walking/sharp pain at onset 8/10/aj.  History of Present Illness: The patient presents for a follow up of hypertension, CAD, hyperlipidemia C/o L knee pain x months C/o LBP worse x months He stopped simvastatin last year   Current Medications (verified): 1)  Simvastatin 40 Mg Tabs (Simvastatin) .... Take One Tablet At Bedtime 2)  Baby Aspirin 81 Mg  Chew (Aspirin) .... Once Daily 3)  Vitamin D3 1000 Unit  Tabs (Cholecalciferol) .[name].. 1 Qd 4)  Omega-3 Fish Oil 1000 Mg Caps (Omega-3 Fatty Acids) .[name].. 1 Once Daily  Allergies (verified): 1)  ! Zocor 2)  ! * Eggs  Past History:  Past Surgical History: Last updated: 09/07/2007 ARTHROSCOPY, KNEE, HX OF (ICD-V45.89) PERCUTANEOUS TRANSLUMINAL CORONARY ANGIOPLASTY, HX OF (ICD-V45.82) * BACK SURGERY APPENDECTOMY, HX OF (ICD-V45.79)  Past Medical History: HYPERTENSION, MILD (ICD-401.1) MYOCARDIAL INFARCTION (ICD-410.90) GASTROESOPHAGEAL REFLUX DISEASE, HX OF (ICD-V12.79) CHEST PAIN, ATYPICAL, HX OF (ICD-V15.89) HYPERLIPIDEMIA (ICD-272.4) CORONARY ARTERY DISEASE (ICD-414.00)   GERD Low back pain Osteoarthritis  Social History: Occupation: retired Current  Smoker Married Alcohol use-no  Physical Exam  General:  alert, well-developed, well-nourished, and cooperative to examination.    Ears:  External ear exam shows no significant lesions or deformities.  Otoscopic examination reveals clear canals, tympanic membranes are intact bilaterally without bulging, retraction, inflammation or discharge. Hearing is grossly normal bilaterally. Mouth:  No pharyngeal erythema. fair dentition.  upper denture intact - no exudate or postnasal drip appreciated Lungs:  bilateral rhonchi L>R; no wheeze;  increase work of breathing at rest or with conversation Heart:  normal rate, regular rhythm, no murmur, and no rub. BLE without edema. Abdomen:  Bowel sounds positive,abdomen soft and non-tender without masses, organomegaly or hernias noted. Msk:  Lumbar-sacral spine is tender to palpation over paraspinal muscles and painfull with the ROM  ROM is decreased B knees w/OA deformities Extremities:  No clubbing, cyanosis, edema, or deformity noted with normal full range of motion of all joints.   Neurologic:  No cranial nerve deficits noted. Station and gait are normal. Plantar reflexes are down-going bilaterally. DTRs are symmetrical throughout. Sensory, motor and coordinative functions appear intact. Skin:  no rashes, no purpura, and no edema.   Psych:  Cognition and judgment appear intact. Alert and cooperative with normal attention span and concentration. No apparent delusions, illusions, hallucinations   Impression & Recommendations:  Problem # 1:  LOW BACK PAIN (ICD-724.2) MSK Assessment New  His updated medication list for this problem includes:  Baby Aspirin 81 Mg Chew (Aspirin) ..... Once daily    Ibuprofen 600 Mg Tabs (Ibuprofen) .[name]... 1 by mouth bid  pc x 1 wk then as needed for  pain    Tramadol Hcl 50 Mg Tabs (Tramadol hcl) .[name]... 1-2 tabs by mouth two times a day as needed pain  Orders: TLB-CK-MB (Creatine Kinase MB) (82553-CKMB) T-Lumbar Spine 2  Views (72100TC) TLB-TSH (Thyroid Stimulating Hormone) (84443-TSH) TLB-Sedimentation Rate (ESR) (85652-ESR) TLB-B12, Serum-Total ONLY (16109-U04) TLB-BMP (Basic Metabolic Panel-BMET) (80048-METABOL) TLB-Hepatic/Liver Function Pnl (80076-HEPATIC) TLB-Uric Acid, Blood (84550-URIC)  Problem # 2:  OSTEOARTHRITIS (ICD-715.90) knees Assessment: Deteriorated  His updated medication list for this problem includes:    Baby Aspirin 81 Mg Chew (Aspirin) ..... Once daily    Ibuprofen 600 Mg Tabs (Ibuprofen) .[name]... 1 by mouth bid  pc x 1 wk then as needed for  pain    Tramadol Hcl 50 Mg Tabs (Tramadol hcl) .[name]... 1-2 tabs by mouth two times a day as needed pain  Orders: TLB-TSH (Thyroid Stimulating Hormone) (84443-TSH) TLB-Sedimentation Rate (ESR) (85652-ESR) CK (Creatine Kinase)-[facility] (82550-23250) TLB-B12, Serum-Total ONLY (54098-J19) TLB-BMP (Basic Metabolic Panel-BMET) (80048-METABOL) TLB-Hepatic/Liver Function Pnl (80076-HEPATIC) TLB-Uric Acid, Blood (84550-URIC) TLB-Rheumatoid Factor (RA) (14782-NF)  Problem # 3:  ARTHRALGIA (ICD-719.40) Assessment: Deteriorated  Orders: TLB-CK-MB (Creatine Kinase MB) (82553-CKMB) TLB-TSH (Thyroid Stimulating Hormone) (84443-TSH) TLB-Sedimentation Rate (ESR) (85652-ESR) TLB-B12, Serum-Total ONLY (62130-Q65) TLB-BMP (Basic Metabolic Panel-BMET) (80048-METABOL) TLB-Hepatic/Liver Function Pnl (80076-HEPATIC) TLB-Uric Acid, Blood (84550-URIC)  Problem # 4:  CORONARY ARTERY DISEASE (ICD-414.00) Assessment: Unchanged  His updated medication list for this problem includes:    Baby Aspirin 81 Mg Chew (Aspirin) ..... Once daily  Orders: TLB-Lipid Panel (80061-LIPID) TLB-Rheumatoid Factor (RA) (78469-GE)  Problem # 5:  HYPERLIPIDEMIA (ICD-272.4) - went off med Assessment: Deteriorated  His updated medication list for this problem includes:    Simvastatin 40 Mg Tabs (Simvastatin) .[name]... Take one tablet at bedtime restart and watch for pain  level  Complete Medication List: 1)  Simvastatin 40 Mg Tabs (Simvastatin) .... Take one tablet at bedtime 2)  Baby Aspirin 81 Mg Chew (Aspirin) .... Once daily 3)  Vitamin D3 1000 Unit Tabs (Cholecalciferol) .[name].. 1 qd 4)  Omega-3 Fish Oil 1000 Mg Caps (Omega-3 fatty acids) .[name].. 1 once daily 5)  Ibuprofen 600 Mg Tabs (Ibuprofen) .[name].. 1 by mouth bid  pc x 1 wk then as needed for  pain 6)  Tramadol Hcl 50 Mg Tabs (Tramadol hcl) .[name].. 1-2 tabs by mouth two times a day as needed pain 7)  Vitamin D 1000 Unit Tabs (Cholecalciferol) .[name].. 1 by mouth qd  Patient Instructions: 1)  Use stretching and balance exercises that I have provided (15 min. or longer every day)  2)  Please schedule a follow-up appointment in 1 month. Prescriptions: IBUPROFEN 600 MG TABS (IBUPROFEN) 1 by mouth bid  pc x 1 wk then as needed for  pain  #60 x 3   Entered and Authorized by:   Tresa Garter MD   Signed by:   Tresa Garter MD on 02/27/2010   Method used:   Print then Give to Patient   RxID:   [id number] TRAMADOL HCL 50 MG TABS (TRAMADOL HCL) 1-2 tabs by mouth two times a day as needed pain  #120 x 3   Entered and Authorized by:   Tresa Garter MD   Signed by:   Tresa Garter MD on 02/27/2010   Method used:   Print then Give to Patient  RxID:   [id number]

## 2010-12-02 NOTE — Miscellaneous (Signed)
Summary: Consent for Skin Biopsy/[facility]  Consent for Skin Biopsy/[facility]   Imported By: Lanelle Bal 08/22/2008 13:15:11  _____________________________________________________________________  External Attachment:    Type:   Image     Comment:   External Document

## 2010-12-02 NOTE — Progress Notes (Signed)
Summary: Calling regarding cholesterol results  Phone Note Call from Patient Call back at [phone]   Summary of Call: Pt request call regarding cholesterol results Initial call taken by: Judie Grieve,  August 13, 2010 9:41 AM  Follow-up for Phone Call        Phone Call Completed PT AWARE WILL COME IN AFTER THANKSGIVING FOR F/U LABS. MED SENT TO [facility] VIA EMR. Follow-up by: Scherrie Bateman, LPN,  August 14, 2010 3:46 PM    New/Updated Medications: CRESTOR 20 MG TABS (ROSUVASTATIN CALCIUM) 1 once daily Prescriptions: CRESTOR 20 MG TABS (ROSUVASTATIN CALCIUM) 1 once daily  #30 x 11   Entered by:   Scherrie Bateman, LPN   Authorized by:   Ferman Hamming, MD, [facility]   Signed by:   Scherrie Bateman, LPN on 45/40/9811   Method used:   Electronically to        [id number]* (retail)       [address] [place], [place]  [address]       Ph: [phone]       Fax: [phone]   RxID:   [id number]

## 2010-12-02 NOTE — Progress Notes (Signed)
Summary: Plavix Rx  Phone Note Outgoing Call   Call placed by: Norva Karvonen, MD,  September 17, 2010 6:29 PM Summary of Call: Spoke with patient and called in Rx for Plavix 75 mg daily x 30 days with zero refills to [facility]. He will pick up in AM. Initial call taken by: Norva Karvonen, MD,  September 17, 2010 6:30 PM    New/Updated Medications: PLAVIX 75 MG TABS (CLOPIDOGREL BISULFATE) Take one tablet by mouth daily

## 2010-12-02 NOTE — Miscellaneous (Signed)
Summary: [facility]  [facility]   Imported By: Esmeralda Links D'jimraou 11/15/2007 11:36:30  _____________________________________________________________________  External Attachment:    Type:   Image     Comment:   External Document

## 2010-12-02 NOTE — Assessment & Plan Note (Signed)
Summary: 6 MTH FU-STC   Vital Signs:  Patient Profile:   65 Years Old Male Weight:      167 pounds Temp:     98.1 degrees F oral Pulse rate:   62 / minute BP sitting:   105 / 67  (left arm)  Vitals Entered By: Tora Perches (April 23, 2008 8:51 AM)                 Chief Complaint:  Multiple medical problems or concerns.  History of Present Illness: F/u CAD, HTN, abd gas - much better. C/o bloating after meals, gas - even with water. Poor appetite. No wt loss    Current Allergies: ! ZOCOR  Past Medical History:    Reviewed history from 02/20/2008 and no changes required:       HYPERTENSION, MILD (ICD-401.1)       MYOCARDIAL INFARCTION (ICD-410.90)       GASTROESOPHAGEAL REFLUX DISEASE, HX OF (ICD-V12.79)       CHEST PAIN, ATYPICAL, HX OF (ICD-V15.89)       HYPERLIPIDEMIA (ICD-272.4)       CORONARY ARTERY DISEASE (ICD-414.00)               GERD   Family History:    Reviewed history from 10/18/2007 and no changes required:       Family History of CAD Male 1st degree relative <50  Social History:    Reviewed history from 10/18/2007 and no changes required:       Occupation:       Current Smoker       Married       Alcohol use-no     Physical Exam  General:     NAD Eyes:     No corneal or conjunctival inflammation noted. EOMI. Perrla. Funduscopic exam benign, without hemorrhages, exudates or papilledema. Vision grossly normal. Mouth:     Oral mucosa and oropharynx without lesions or exudates.  Teeth in good repair. Abdomen:     S/NT Msk:     No deformity or scoliosis noted of thoracic or lumbar spine.   Extremities:     No clubbing, cyanosis, edema, or deformity noted with normal full range of motion of all joints.   Neurologic:     No cranial nerve deficits noted. Station and gait are normal. Plantar reflexes are down-going bilaterally. DTRs are symmetrical throughout. Sensory, motor and coordinative functions appear intact. Skin:     Intact without  suspicious lesions or rashes Psych:     Cognition and judgment appear intact. Alert and cooperative with normal attention span and concentration. No apparent delusions, illusions, hallucinations    Impression & Recommendations:  Problem # 1:  ABDOMINAL PAIN (ICD-789.00) Assessment: Improved Mostly bloating, poss motility disorder. Hold Simvastatin x 2-3 wks His updated medication list for this problem includes:    Metoclopramide Hcl 10 Mg Tabs (Metoclopramide hcl) .[name]... 1 by mouth three times a day ac   Problem # 2:  CORONARY ARTERY DISEASE (ICD-414.00) Assessment: New  His updated medication list for this problem includes:    Baby Aspirin 81 Mg Chew (Aspirin) ..... Once daily   Problem # 3:  Hep B vacc - last one  Complete Medication List: 1)  Simvastatin 40 Mg Tabs (Simvastatin) .... Take one tablet at bedtime 2)  Protonix 40 Mg Tbec (Pantoprazole sodium) .[name].. 1 once daily 3)  Baby Aspirin 81 Mg Chew (Aspirin) .... Once daily 4)  Vitamin D3 1000 Unit Tabs (Cholecalciferol) .Marland KitchenMarland KitchenMarland Kitchen 1  qd 5)  Metoclopramide Hcl 10 Mg Tabs (Metoclopramide hcl) .[name].. 1 by mouth three times a day ac  Other Orders: Hepatitis B Vaccine >72yrs (16109) Admin 1st Vaccine (60454)   Patient Instructions: 1)  Please schedule a follow-up appointment in 4 months.   Prescriptions: METOCLOPRAMIDE HCL 10 MG TABS (METOCLOPRAMIDE HCL) 1 by mouth three times a day ac  #90 x 0   Entered and Authorized by:   Tresa Garter MD   Signed by:   Tresa Garter MD on 04/23/2008   Method used:   Electronically sent to ...       [id number]       [address] [place], [place]  [address]       Ph: [phone]       Fax: [phone]   RxID:   [id number]  ]  Hepatitis B Immunization History:    Hep B # 1:  HepB Adult (09/01/2007)    Hep B # 2:  HepB Adult (10/18/2007)    Hep B # 3:  HepB Adult (04/23/2008)  Hepatitis B Vaccine # 3    Vaccine Type:  HepB Adult    Site: left deltoid    Mfr: GlaxoSmithKline    Dose: 1.0 ml    Route: IM    Given by: Tora Perches    Exp. Date: 09/14/2009    Lot #: [id number]    VIS given: 05/19/06 version given April 23, 2008.

## 2010-12-02 NOTE — Assessment & Plan Note (Signed)
Summary: pvd/claudication  Medications Added PLETAL 100 MG TABS (CILOSTAZOL) take one tablet by mouth two times a day        Visit Type:  Initial Consult Primary Provider:  Tresa Garter MD  CC:  Ne PV evaluation per Dr.Crenshaw- claudication.  History of Present Illness: 65 year-old male presents for initial evaluation of lower extremity PAD. He reports intermittent claudication at less than 5 minutes or 100 feet of walking. Both legs are affected and pain is limited to calf areas. Pain resolves within 5 minutes of rest. He reports the presence of symptoms for approximately one year and states symptoms are stable over this time period. He is unable to do his job because of limitation from leg pain. No rest pain or ulcerations.  ABI's/duplex exam reveals a right ABI of 0.8 with tibial disease noted and a left ABI of 0.85 with iliac and mid-SFA stenosis noted.    Current Medications (verified): 1)  Simvastatin 40 Mg Tabs (Simvastatin) .... Take One Tablet At Bedtime 2)  Baby Aspirin 81 Mg  Chew (Aspirin) .... Once Daily 3)  Nitrostat 0.4 Mg Subl (Nitroglycerin) .[name].. 1 Tablet Under Tongue At Onset of Chest Pain; You May Repeat Every 5 Minutes For Up To 3 Doses.  Allergies: 1)  ! * Eggs  Past History:  Past medical, surgical, family and social histories (including risk factors) reviewed, and no changes noted (except as noted below).  Past Medical History: HYPERTENSION, MILD (ICD-401.1) MYOCARDIAL INFARCTION (ICD-410.90) GASTROESOPHAGEAL REFLUX DISEASE, HX OF (ICD-V12.79) CHEST PAIN, ATYPICAL, HX OF (ICD-V15.89) HYPERLIPIDEMIA (ICD-272.4) CORONARY ARTERY DISEASE (ICD-414.00) Peripheral arterial disease with ABI's 0.8 on the right and 0.86 on the left   GERD Low back pain Osteoarthritis  Past Surgical History: Reviewed history from 09/07/2007 and no changes required. ARTHROSCOPY, KNEE, HX OF (ICD-V45.89) PERCUTANEOUS TRANSLUMINAL CORONARY ANGIOPLASTY, HX OF  (ICD-V45.82) * BACK SURGERY APPENDECTOMY, HX OF (ICD-V45.79)  Family History: Reviewed history from 10/18/2007 and no changes required. Family History of CAD Male 1st degree relative <50  Social History: Reviewed history from 04/23/2010 and no changes required. Occupation: retired Current Smokerquit 04/03/10 Married Alcohol use-no  Review of Systems       Positive for stable angina, otherwise negative except as per HPI   Vital Signs:  Patient profile:   65 year old male Height:      65 inches Weight:      160.25 pounds BMI:     26.76 Pulse rate:   80 / minute Pulse rhythm:   regular Resp:     18 per minute BP sitting:   100 / 60  (left arm) Cuff size:   large  Vitals Entered By: Vikki Ports (June 02, 2010 3:00 PM)  Serial Vital Signs/Assessments:  Time      Position  BP       Pulse  Resp  Temp     By           R Arm     104/60                         Vikki Ports   Physical Exam  General:  Pt is alert and oriented, in no acute distress. HEENT: normal Neck: normal carotid upstrokes with soft bilateral bruits, JVP normal Lungs: CTA CV: RRR without murmur or gallop Abd: soft, NT, positive BS, no bruit, no organomegaly Ext: no clubbing, cyanosis, or edema. right femoral pulse 1+ and pedals diminished, left  fem pulse 1+ and pedals diminished. Both feet are warm without rash. Skin: warm and dry without rash    Impression & Recommendations:  Problem # 1:  ATHEROSCLEROSIS W /INT CLAUDICATION (ICD-440.21) The patient has lifestyle limiting claudication with significant symptoms but only mild reduction in ABI's. He does not have evidence of critical limb ischemia. Recommend a 3 month trial of pletal and walking program, with clinical f/u after this time period. If he does not have a good response to medical therapy, will move forward with angiography and possible PTA. He is on appropriate medical therapy for risk reduction with ASA and a statin. He has discontinued  tobacco.  Patient Instructions: 1)  Your physician recommends that you schedule a follow-up appointment in: 3 MONTHS 2)  Your physician has recommended you make the following change in your medication: START Pletal 100mg  by mouth one-half tablet by mouth twice a day for one week then increase to 100mg  by mouth twice a day Prescriptions: PLETAL 100 MG TABS (CILOSTAZOL) take one tablet by mouth two times a day  #60 x 6   Entered by:   Julieta Gutting, RN, BSN   Authorized by:   Norva Karvonen, MD   Signed by:   Julieta Gutting, RN, BSN on 06/02/2010   Method used:   Electronically to        [id number] (retail)       [address] [place], [place]  [address]       Ph: [phone]       Fax: [phone]   RxID:   [id number]

## 2010-12-02 NOTE — Letter (Signed)
Summary: ER Notification  Architectural technologist at [facility] [address]   [place], [place] [address]   Phone: [phone]  Fax:     Mar 12, 2010 4:16 PM  Harold Waters  The above referenced patient has been advised to report directly to the Emergency Room. Please see below for more information:  Dx: chest pain/SOB with exerction/left arm tingling     Private Vehicle  __xxx_____________ or EMS:  ________________   Orders:  Yes _____ or No  _______   Notify upon arrival:     Trish [phone]       Or _________________   Thank you,    [facility] Staff

## 2010-12-04 NOTE — Miscellaneous (Signed)
Summary: Orders Update  Clinical Lists Changes  Orders: Added new Test order of Arterial Duplex Lower Extremity (Arterial Duplex Low) - Signed 

## 2010-12-04 NOTE — Assessment & Plan Note (Signed)
Summary: EPH/S/P LE ANGIOGRAM   Visit Type:  Follow-up Primary Provider:  Tresa Garter MD  CC:  Post-angiogram.  History of Present Illness: 65 year-old male presenting for follouwp of lower extremity PAD. He had lifestyle limiting claudication of the left leg and failed a trial of medical therapy. He underwent angiography last month and had successful stenting of a severe stenosis in the left SFA. The patient is doing well and reports improvement in his left leg claudication symptoms. He conitnues to have mild bilateral calf claudication but at a much longer distance than before. No other complaints today.  Current Medications (verified): 1)  Crestor 20 Mg Tabs (Rosuvastatin Calcium) .[name].. 1 Once Daily 2)  Baby Aspirin 81 Mg  Chew (Aspirin) .... Once Daily 3)  Nitrostat 0.4 Mg Subl (Nitroglycerin) .[name].. 1 Tablet Under Tongue At Onset of Chest Pain; You May Repeat Every 5 Minutes For Up To 3 Doses. 4)  Plavix 75 Mg Tabs (Clopidogrel Bisulfate) .... Take One Tablet By Mouth Daily  Allergies: 1)  ! * Eggs  Past History:  Past medical history reviewed for relevance to current acute and chronic problems.  Past Medical History: Reviewed history from 06/30/2010 and no changes required. HYPERTENSION, MILD (ICD-401.1) MYOCARDIAL INFARCTION (ICD-410.90) GASTROESOPHAGEAL REFLUX DISEASE, HX OF (ICD-V12.79) CHEST PAIN, ATYPICAL, HX OF (ICD-V15.89) HYPERLIPIDEMIA (ICD-272.4) CORONARY ARTERY DISEASE (ICD-414.00) Peripheral arterial disease with ABI's 0.8 on the right and 0.86 on the left GERD Low back pain Osteoarthritis  Vital Signs:  Patient profile:   65 year old male Height:      65 inches Weight:      161 pounds BMI:     26.89 Pulse rate:   60 / minute Pulse rhythm:   regular Resp:     18 per minute BP sitting:   118 / 64  (left arm) Cuff size:   large  Vitals Entered By: Vikki Ports (October 14, 2010 9:17 AM)  Serial Vital Signs/Assessments:  Time      Position   BP       Pulse  Resp  Temp     By           R Arm     122/68                         Vikki Ports   Physical Exam  General:  Pt is alert and oriented, in no acute distress. HEENT: normal Lungs: CTA CV: RRR without murmur or gallop Abd: soft, NT, positive BS, no bruit, no organomegaly Ext: no clubbing, cyanosis, or edema. pedal pulses 2+= bilaterally Skin: warm and dry without rash    ABI's  Procedure date:  10/14/2010  Findings:      Right 0.96 Left 1.1 Brisk biphasic waveforms bilaterally  Impression & Recommendations:  Problem # 1:  ATHEROSCLEROSIS W /INT CLAUDICATION (ICD-440.21) Pt improved following PTA stenting of the left SFA. I'd like him to continue plavix for a total duration of 3 months, then stay on long-term ASA 81 mg. We discussed increasing his walking program. Continue with secondary risk reduction measures. Plan followup duplex scan and ABI's in 6 months with an office visit to follow.  Patient Instructions: 1)  Your physician recommends that you schedule a follow-up appointment in: 5 months with doppler study of legs the same day. 2)  Your physician recommends that you continue on your current medications as directed. Please refer to the Current Medication list given to you  today. Continue Plavix for 2 months and then stop. Continue all other medications. 3)  Your physician has requested that you have a lower or upper extremity arterial duplex.  This test is an ultrasound of the arteries in the legs or arms.  It looks at arterial blood flow in the legs and arms.  Allow one hour for Lower and Upper Arterial scans. There are no restrictions or special instructions. To be done in 5 months on day of appointment with Dr. Excell Seltzer Prescriptions: PLAVIX 75 MG TABS (CLOPIDOGREL BISULFATE) Take one tablet by mouth daily  #30 x 2   Entered by:   Dossie Arbour, RN, BSN   Authorized by:   Norva Karvonen, MD   Signed by:   Dossie Arbour, RN, BSN on  10/14/2010   Method used:   Electronically to        [facility] [phone]* (retail)       [address] [place], [place]  [address]       Ph: [phone]       Fax: [phone]   RxID:   [id number]

## 2011-01-13 LAB — BASIC METABOLIC PANEL
BUN: 16 mg/dL (ref 6–23)
CO2: 26 mEq/L (ref 19–32)
Calcium: 9.2 mg/dL (ref 8.4–10.5)
Chloride: 108 mEq/L (ref 96–112)
Creatinine, Ser: 1.1 mg/dL (ref 0.4–1.5)
GFR calc Af Amer: >60 mL/min (ref >60)
GFR calc non Af Amer: >60 mL/min (ref >60)
Glucose, Bld: 104 mg/dL — ABNORMAL HIGH (ref 70–99)
Potassium: 4 mEq/L (ref 3.5–5.1)
Sodium: 143 mEq/L (ref 135–145)

## 2011-01-13 LAB — CBC
HCT: 43.1 % (ref 39.0–52.0)
Hemoglobin: 14.5 g/dL (ref 13.0–17.0)
MCH: 29.3 pg (ref 26.0–34.0)
MCHC: 33.6 g/dL (ref 30.0–36.0)
MCV: 87.1 fL (ref 78.0–100.0)
Platelets: 211 10*3/uL (ref 150–400)
RBC: 4.95 MIL/uL (ref 4.22–5.81)
RDW: 13.6 % (ref 11.5–15.5)
WBC: 5.6 10*3/uL (ref 4.0–10.5)

## 2011-01-13 LAB — PROTIME-INR
INR: 0.9 (ref 0.00–1.49)
Prothrombin Time: 12.4 seconds (ref 11.6–15.2)

## 2011-01-19 LAB — CARDIAC PANEL(CRET KIN+CKTOT+MB+TROPI)
CK, MB: 1 ng/mL (ref 0.3–4.0)
CK, MB: 1 ng/mL (ref 0.3–4.0)
CK, MB: 1.3 ng/mL (ref 0.3–4.0)
Relative Index: INVALID (ref 0.0–2.5)
Relative Index: INVALID (ref 0.0–2.5)
Relative Index: INVALID (ref 0.0–2.5)
Total CK: 58 U/L (ref 7–232)
Total CK: 73 U/L (ref 7–232)
Total CK: 83 U/L (ref 7–232)
Troponin I: 0.01 ng/mL (ref 0.00–0.06)
Troponin I: 0.01 ng/mL (ref 0.00–0.06)
Troponin I: 0.12 ng/mL — ABNORMAL HIGH (ref 0.00–0.06)

## 2011-01-19 LAB — BASIC METABOLIC PANEL
BUN: 13 mg/dL (ref 6–23)
BUN: 20 mg/dL (ref 6–23)
CO2: 24 mEq/L (ref 19–32)
CO2: 26 mEq/L (ref 19–32)
Calcium: 8.4 mg/dL (ref 8.4–10.5)
Calcium: 8.6 mg/dL (ref 8.4–10.5)
Chloride: 110 mEq/L (ref 96–112)
Chloride: 110 mEq/L (ref 96–112)
Creatinine, Ser: 1.03 mg/dL (ref 0.4–1.5)
Creatinine, Ser: 1.37 mg/dL (ref 0.4–1.5)
GFR calc Af Amer: >60 mL/min (ref >60)
GFR calc Af Amer: >60 mL/min (ref >60)
GFR calc non Af Amer: 52 mL/min — ABNORMAL LOW (ref >60)
GFR calc non Af Amer: >60 mL/min (ref >60)
Glucose, Bld: 107 mg/dL — ABNORMAL HIGH (ref 70–99)
Glucose, Bld: 110 mg/dL — ABNORMAL HIGH (ref 70–99)
Potassium: 4 mEq/L (ref 3.5–5.1)
Potassium: 4.1 mEq/L (ref 3.5–5.1)
Sodium: 139 mEq/L (ref 135–145)
Sodium: 141 mEq/L (ref 135–145)

## 2011-01-19 LAB — CBC
HCT: 40.8 % (ref 39.0–52.0)
HCT: 41.4 % (ref 39.0–52.0)
HCT: 41.6 % (ref 39.0–52.0)
Hemoglobin: 14.1 g/dL (ref 13.0–17.0)
Hemoglobin: 14.2 g/dL (ref 13.0–17.0)
Hemoglobin: 14.5 g/dL (ref 13.0–17.0)
MCHC: 34.1 g/dL (ref 30.0–36.0)
MCHC: 34.6 g/dL (ref 30.0–36.0)
MCHC: 35.1 g/dL (ref 30.0–36.0)
MCV: 90.1 fL (ref 78.0–100.0)
MCV: 91.4 fL (ref 78.0–100.0)
MCV: 91.9 fL (ref 78.0–100.0)
Platelets: 218 10*3/uL (ref 150–400)
Platelets: 229 10*3/uL (ref 150–400)
Platelets: 239 10*3/uL (ref 150–400)
RBC: 4.44 MIL/uL (ref 4.22–5.81)
RBC: 4.55 MIL/uL (ref 4.22–5.81)
RBC: 4.6 MIL/uL (ref 4.22–5.81)
RDW: 13.6 % (ref 11.5–15.5)
RDW: 13.8 % (ref 11.5–15.5)
RDW: 14 % (ref 11.5–15.5)
WBC: 6.4 10*3/uL (ref 4.0–10.5)
WBC: 6.6 10*3/uL (ref 4.0–10.5)
WBC: 6.6 10*3/uL (ref 4.0–10.5)

## 2011-01-19 LAB — PROTIME-INR
INR: 0.99 (ref 0.00–1.49)
Prothrombin Time: 13 seconds (ref 11.6–15.2)

## 2011-01-19 LAB — POCT I-STAT, CHEM 8
BUN: 15 mg/dL (ref 6–23)
Calcium, Ion: 1.12 mmol/L (ref 1.12–1.32)
Chloride: 108 mEq/L (ref 96–112)
Creatinine, Ser: 1.1 mg/dL (ref 0.4–1.5)
Glucose, Bld: 103 mg/dL — ABNORMAL HIGH (ref 70–99)
HCT: 44 % (ref 39.0–52.0)
Hemoglobin: 15 g/dL (ref 13.0–17.0)
Potassium: 4.2 mEq/L (ref 3.5–5.1)
Sodium: 138 mEq/L (ref 135–145)
TCO2: 23 mmol/L (ref 0–100)

## 2011-01-19 LAB — HEPARIN LEVEL (UNFRACTIONATED)
Heparin Unfractionated: 0.25 IU/mL — ABNORMAL LOW (ref 0.30–0.70)
Heparin Unfractionated: 0.49 IU/mL (ref 0.30–0.70)
Heparin Unfractionated: 0.83 IU/mL — ABNORMAL HIGH (ref 0.30–0.70)

## 2011-01-19 LAB — POCT CARDIAC MARKERS
CKMB, poc: <1 ng/mL — ABNORMAL LOW (ref 1.0–8.0)
Myoglobin, poc: 62.8 ng/mL (ref 12–200)
Troponin i, poc: <0.05 ng/mL (ref 0.00–0.09)

## 2011-01-19 LAB — LIPID PANEL
Cholesterol: 263 mg/dL — ABNORMAL HIGH (ref 0–200)
HDL: 32 mg/dL — ABNORMAL LOW (ref >39)
LDL Cholesterol: 158 mg/dL — ABNORMAL HIGH (ref 0–99)
Total CHOL/HDL Ratio: 8.2 RATIO
Triglycerides: 365 mg/dL — ABNORMAL HIGH (ref <150)
VLDL: 73 mg/dL — ABNORMAL HIGH (ref 0–40)

## 2011-01-19 LAB — CK TOTAL AND CKMB (NOT AT ARMC)
CK, MB: 1.1 ng/mL (ref 0.3–4.0)
Relative Index: INVALID (ref 0.0–2.5)
Total CK: 94 U/L (ref 7–232)

## 2011-01-19 LAB — HEMOGLOBIN A1C
Hgb A1c MFr Bld: 6.4 % — ABNORMAL HIGH (ref <5.7)
Mean Plasma Glucose: 137 mg/dL — ABNORMAL HIGH (ref <117)

## 2011-01-19 LAB — MRSA PCR SCREENING: MRSA by PCR: NEGATIVE

## 2011-01-19 LAB — TROPONIN I: Troponin I: <0.01 ng/mL (ref 0.00–0.06)

## 2011-01-19 LAB — TSH: TSH: 2.262 u[IU]/mL (ref 0.350–4.500)

## 2011-01-19 LAB — APTT: aPTT: 28 seconds (ref 24–37)

## 2011-03-17 NOTE — Procedures (Signed)
Harold Waters, Harold Waters                ACCOUNT NO.:  [id number]   MEDICAL RECORD NO.:  [id number]          PATIENT TYPE:  AMB   LOCATION:  SDS                          FACILITY:  [facility]   PHYSICIAN:  Veverly Fells. Excell Seltzer, MD  DATE OF BIRTH:  08-May-1946   DATE OF PROCEDURE:  09/17/2010  DATE OF DISCHARGE:  09/17/2010                    PERIPHERAL VASCULAR INVASIVE PROCEDURE    PROCEDURE:  1. Suprarenal aortogram.  2. Distal abdominal aortogram with runoff.  3. Left external iliac angiogram with runoff (contralateral approach).  4. Left superficial femoral artery stent.  5. Perclose of the right femoral artery.   PERFORMING PHYSICIAN:  Lorine Bears, MD   PROCTORING PHYSICIAN:  Veverly Fells. Excell Seltzer, MD   PROCEDURAL INDICATION:  Mr. Brandow is a 65 year old gentleman with  coronary artery disease and peripheral arterial disease who presented  with lifestyle-limiting claudication of the left leg.  He was found to  have severe focal stenosis of the left SFA on noninvasive testing.  He  was given a 3 to 46-month trial of a walking program and Pletal.  His  symptoms did not improve and he was referred for angiography.   Risks and indications of procedure were reviewed with the patient.  Informed consent was obtained.  A 5-French sheath was placed in the  right femoral artery and a pigtail catheter was advanced into the  suprarenal aorta.  A suprarenal aortogram was performed using digital  subtraction angiography.  The pigtail catheter was brought down into the  distal aorta and a distal aortogram was performed with runoff to the  feet using digital subtraction angiography under the bolus chase method.  This demonstrated an ulcerated appearance of the distal abdominal aorta  with mild-to-moderate associated stenosis.  Intra-arterial nitroglycerin  was given above the level of disease and a pullback was performed using  an end-hole catheter.  This demonstrated a 15-20 mm induced pressure  gradient.  The iliac arteries were free of significant disease and after  review of the films between Dr. Kirke Corin and myself, we elected to  intervene on the left superficial femoral artery as this is the  patient's symptomatic leg and there is a severe 90% stenosis present.  A  crossover catheter was used and a 6-French Terumo Destination sheath was  placed in the left external iliac artery using normal technique.  The  lesion was crossed using a Versacore wire which was converted to a long  wire.  Intravenous heparin was given in a weight adjusted fashion.  The  SFA was dilated using a 6 x 30-mm balloon which was taken to 4  atmospheres and was fully expanded.  Following balloon dilatation, there  was a focal balloon-induced dissection and the lesion was therefore  stented with an 8 x 40-mm EverFlex Protege stent.  The stent was  deployed appropriately and postdilated with a 6 x 30 balloon so that the  entire stented segment was dilated.  The balloon was dilated to 6  atmospheres for both post dilatations.  Final angiography demonstrated  an excellent result with 0% residual stenosis and good transitions off  the proximal and distal edges  of the stent.  The patient tolerated the  procedure well.  The sheath was removed over the wire and dilator so as  to not interrupt the distal aortic plaque.  A Perclose device was used  for femoral hemostasis.  There were no immediate complications.   PROCEDURAL FINDINGS:  The distal abdominal aorta has ulcerated plaque.  There is no critical stenosis and again, there is a 20-mm associated  pressure gradient induced with nitroglycerin.  There are single, patent  renal arteries bilaterally.   Right leg:  The common, internal and external iliac arteries are patent.  There is mild diffuse plaque in the common iliac artery.  The internal  and external iliac arteries have no significant disease.  The common  femoral is patent.  The deep femoral artery is  patent.  The superficial  femoral artery is patent throughout its course.  There is three-vessel  runoff to the right foot with severe stenosis of the right anterior  tibial artery and mild stenosis of the right posterior tibial artery.   Left leg:  The left common, internal, and external iliac arteries are  all patent with mild plaque and the common femoral is patent.  The deep  and superficial femoral arteries are patent.  In the mid left  superficial femoral artery, there is severe eccentric focal stenosis of  90%.  The popliteal artery is patent.  There is three-vessel runoff to  the foot with patency of the anterior and posterior tibial arteries  without significant tibial disease.   FINAL IMPRESSION:  1. Moderate irregular plaque in the distal abdominal aorta with a 15-      20 mm pressure gradient induced with nitroglycerin.  2. Severe left superficial femoral stenosis, treated successfully with      a single self-expanding stent.  3. Right leg tibial disease.   RECOMMENDATIONS:  The patient should receive aspirin and Plavix for a  minimum of 30 days.  We will reassess his distal abdominal aorta with an  ultrasound study.      Veverly Fells. Excell Seltzer, MD      MDC/MEDQ  D:  09/17/2010  T:  09/17/2010  Job:  [id number]   cc:   Georgina Quint. Plotnikov, MD  Pricilla Riffle, MD, [facility]   Electronically Signed by Tonny Bollman MD on 09/23/2010 10:23:56 PM

## 2011-03-20 NOTE — H&P (Signed)
NAMELAURA, RADILLA                ACCOUNT NO.:  [id number]   MEDICAL RECORD NO.:  [id number]          PATIENT TYPE:  EMS   LOCATION:  MAJO                         FACILITY:  [facility]   PHYSICIAN:  Valetta Mole. Swords, M.D. [facility] OF BIRTH:  1946/07/20   DATE OF ADMISSION:  05/13/2005  DATE OF DISCHARGE:                                HISTORY & PHYSICAL   CHIEF COMPLAINT:  Josephina Gip I was about to faint.   HISTORY OF PRESENT ILLNESS:  Mr. Pierpoint is a 65 year old gentleman with a  history of coronary disease who presents with vague symptoms. He has a  difficult history to obtain as his story changes frequently during  examination and changes based on whether the family members are present or  not. He initially complained of right arm and leg numbness which occurred  yesterday and was associated with neck pain. He stated initially that this  was his only problem. He also stated that the symptoms resolved without any  residual symptoms. Shortly after stating that, he stills that occasionally  his right arm and leg go numb. He then denied that he ever had any of these  symptoms previously, but when a family member was present, he said that  these symptoms frequently occurred and have been going on for years and  frequently moved from one side of his body to another. He denies any  weakness this time around. Denies specifically any arm or leg weakness.  Denies any falls.   He then went on to say that he had some chest pain without any shortness of  breath. He does have a history of coronary artery disease and apparently a  history of myocardial infarction in 1997. He states he had an angioplasty at  that time. There are records in the computer that stated that he had a stent  placed in 1997 in [place]. He then goes on to say that he has had multiple  stents placed but there is no record of that and he had an angiogram in 2004  but does not document any other stents or any history of  angioplasty.   When I pressured him to determine exactly why he came here today, he stated  that he felt weak. His son then chimed in that perhaps he felt lightheaded  and faint and then the patient agreed to that. He stated that his son-in-law  called because he felt faint today and nearly passed out. He states that he  had no loss of consciousness. He really did not feel lightheaded but things  did appear black at one time. He denies any associated headache. His only  other complaint in the complete review of systems is that he was cold  yesterday and that he has chronic knee pain.   PAST MEDICAL HISTORY:  1.  Significant for knee surgery.  2.  Coronary interventions as above.  3.  Note, he did have a coronary angiogram in 2004.   CURRENT MEDICATIONS:  Aspirin.   ALLERGIES:  None.   SOCIAL HISTORY:  Lives with his wife. His children are grown. He  smokes one  pack per week but then states he frequently smokes more. He says he is  disabled secondary to his knee surgery.   REVIEW OF SYSTEMS:  As above without any other complaints in the review of  systems.   PHYSICAL EXAMINATION:  VITAL SIGNS:  Temperature 98.5, heart rate 72, blood  pressure 99/58, respirations 18. When I did orthostatic's, lying blood  pressure 100/60 and standing blood pressure 105/70.  GENERAL:  Appears well-developed, well-nourished male in no acute distress.  HEENT:  Normocephalic and atraumatic. Extraocular movements intact.  NECK:  Supple without lymphadenopathy, thyromegaly, jugular venous  distention, or carotid bruits.  CHEST:  Clear to auscultation without any increased work of breathing.  CARDIOVASCULAR:  S1 and S2 are regular without significant murmurs or  gallops. PMI is normal.  ABDOMEN:  Active bowel sounds. Soft and nontender. There is no  hepatosplenomegaly. No masses are palpated.  EXTREMITIES:  There is no clubbing, cyanosis, or edema.  NEUROLOGICAL:  He is alert and oriented without motor  or sensory deficits.  Specifically, he has no sensory loss in his extremities. Motor strength is  normal in the upper and lower extremities.   LABORATORY DATA:  A CBC is normal except for a white count of 11.1. CMET is  normal. INR is 0.9. PTT is 28 seconds. Urinalysis is normal. Cardiac markers  are normal.   EKG demonstrates normal sinus rhythm with an inferior MI. Compared to 2004,  the inferior MI is not present. CT of the head per report shows no acute  stroke.   ASSESSMENT/PLAN:  1.  A very confusing story which frequently changes. It is very difficult to      get a handle on what Mr. Nong' real symptoms are other than he must      have felt bad enough to come to the hospital. He does complain of right      arm and leg numbness and paraesthesia.  2.  Complaint of chest pain.  3.  Lightheaded and faint.  4.  History of coronary artery disease.  5.  History of hyperlipidemia (the patient is currently noncompliant with      medications).  6.  Tobacco abuse.  7.  It is worth noting that his examination is normal including orthostatic      symptoms, so it is unclear what a feeling faintness may have been. I      think the only objective finding is that his EKG has changed from 2004      clearly as evidence of inferior MI currently, not present in 2004. It is      worth noting that at that time, however, he did have a Cardiolite which      documented an inferior scar.   PLAN:  1.  I think it is best to admit Mr. Rowe given the confusing story. We      will rule out for a MI which I seriously doubt he has. We will check an      EKG in the morning. Check cardiac enzymes. I think he needs a MRI of his      brain to rule out a stroke. Tobacco abuse he needs to quit.  2.  History of hyperlipidemia:  We will check a fasting lipid panel.      Bruce Rexene Edison Swords, M.D. [facility]  Electronically Signed    BHS/MEDQ  D:  05/14/2005  T:  05/14/2005  Job:  [id number]   cc:  Georgina Quint.  Plotnikov, M.D. [facility]  [address]  [place]  [place] [address]

## 2011-03-20 NOTE — Discharge Summary (Signed)
NAMEBINGHAM, MILLETTE                ACCOUNT NO.:  [id number]   MEDICAL RECORD NO.:  [id number]          PATIENT TYPE:  INP   LOCATION:  6526                         FACILITY:  [facility]   PHYSICIAN:  Bruce Rexene Edison. Swords, M.D. [facility] OF BIRTH:  Nov 01, 1946   DATE OF ADMISSION:  05/13/2005  DATE OF DISCHARGE:  05/15/2005                                 DISCHARGE SUMMARY   DISCHARGE DIAGNOSIS:  Status post near syncopal event with weakness.   HISTORY OF PRESENT ILLNESS:  The patient is a 65 year old male who was  admitted with complaints of weakness and fatigue as well as right arm and  right leg numbness.  In addition, the patient reported chest pain and light  headedness.  The patient was admitted for further evaluation.   PAST MEDICAL HISTORY:  1.  Status post knee surgery.  2.  History of MI in 1997 with stent and angioplasty as well as stent placed      in 2004 per patient.   HOSPITAL COURSE:  Status post near syncopal event with complaints of chest  pain and weakness.  The patient was admitted and underwent a CT head which  was normal.  A chest x-ray was performed which showed no acute disease.  In  addition, an MRI/MRA was performed which showed abnormal subcortical white  matter on the left greater than the right.  Differential was noted by  radiology to include white matter ischemia, migraines, demyelinating  process, infection, or inflammation.  In addition, the patient underwent  cardiac enzymes which were negative x 3.  Consider outpatient evaluation for  abnormal MRI, stable from a cardiac standpoint.  The patient was discharged  to home on May 14, 2005.   DISCHARGE MEDICATIONS:  Aspirin 325 mg p.o. daily.   DISCHARGE LABORATORY DATA:  Hemoglobin 15, hematocrit 44.4, white blood cell  count 11.1, platelets 286.   FOLLOW UP:  A follow up appointment was scheduled for the patient to see Dr.  Jacinta Shoe on July 27 at 11:15 a.m.      Melissa S. Peggyann Juba,  NP      Valetta Mole. Swords, M.D. [facility]  Electronically Signed    MSO/MEDQ  D:  07/15/2005  T:  07/15/2005  Job:  [id number]   cc:   Georgina Quint. Plotnikov, M.D. [facility]  [address]  [place]  [place] [address]

## 2011-03-20 NOTE — Discharge Summary (Signed)
   NAMETYREEK, CLABO                            ACCOUNT NO.:  [id number]   MEDICAL RECORD NO.:  [id number]                   PATIENT TYPE:  INP   LOCATION:  2033                                 [facility]ACILITY[facility]  [facility]   PHYSICIAN:  Armelia Penton DICTATOR                    DATE OF BIRTH:  12-28-1945   DATE OF ADMISSION:  06/11/2002  DATE OF DISCHARGE:                           DISCHARGE SUMMARY - REFERRING   HISTORY:  Mr. Broner is a 65 year old male patient with a history of  myocardial infarction  in 1997 with an inferior MI and a second event the  same year of the same area.  About 1 week prior to admission he developed  his typical anginal pain that he describes as sharp and radiating into his  left collarbone.  This was associated with nausea.  Sublingual nitroglycerin  in the emergency room seemed to relieve his pain.   PAST MEDICAL HISTORY:  Significant for the above mentioned CAD.  He does  have a history of hyperlipidemia, hypertension, and _________  gastroduodenitis.  He does have a family history of coronary artery disease  in his brother.  He is a smoker.   He was admitted to [facility] overnight and did rule out for myocardial  infarction.  His stress Cardiolite today revealed no ischemia with an EF of  around 53%.  I spoke with Dr. Jens Som who thought that it was safe for the  patient to go home with strict follow-up in the office on June 28, 2002 at  10:30 a.m.  At that point the patient is to bring all of his medications to  his appointment because he was unsure of his home medications.  We did ask  him to continue aspirin 325 mg a day, continue his Lipitor, and I did give  him a prescription for sublingual nitroglycerin and explained to him how to  use it.  Activity as tolerated. Remain on a low cholesterol heart-healthy  diet and he is instructed to quit smoking.   His lab studies during his hospital stay revealed negative CK-MB/troponins,  total cholesterol 205, total  triglycerides 233.  HDL 34, LDL 124.  Hemoglobin 16.0, hematocrit 46.0.  Sodium 139, potassium 3.7, BUN of 18,  creatinine of 1.4.                                               Stephannie Broner DICTATOR    DD/MEDQ  D:  06/12/2002  T:  06/15/2002  Job:  [id number]   cc:   Madolyn Frieze. Jens Som, M.D. [facility]   Aleksei V. Plotnikov, M.D. [facility]

## 2011-03-20 NOTE — Cardiovascular Report (Signed)
   NAMEBROCKTON, Harold Waters                            ACCOUNT NO.:  [id number]   MEDICAL RECORD NO.:  [id number]                   PATIENT TYPE:  OIB   LOCATION:  2854                                 FACILITY:  [facility]   PHYSICIAN:  Noralyn Pick. Eden Emms, M.D. [facility]           DATE OF BIRTH:  December 11, 1945   DATE OF PROCEDURE:  DATE OF DISCHARGE:                              CARDIAC CATHETERIZATION   PROCEDURE:  Coronary arteriography.   INDICATION:  Recurrent chest pain, previous stent to a small nondominant  right coronary artery in the proximal OM branch in [place] in 1997.   DESCRIPTION OF PROCEDURE:  Standard catheterization was done from the right  femoral artery.   RESULTS:  Left main coronary artery had a 20% distal stenosis.   Left anterior descending artery had 30% multiple acute lesions proximally.  The first diagonal branch is normal.   The circumflex coronary artery is dominant.  There is 20-30% multiple  discrete lesions proximally. There was a stent in the proximal first obtuse  marginal branch with 20% in-stent re-stenosis.   Distal vessel was normal.   The right coronary artery is small and nondominant. There was a 30-40%  discrete lesion in this stent. This is in the mid vessel.   RIGHT ANTERIOR OBLIQUE VENTRICULOGRAPHY:  RAO ventriculography revealed  normal wall motion. Ejection fraction of 60%. There was no gradient across  the aortic valve and no MR.   Aortic pressure is 106/55, LV pressure is 107/17.   IMPRESSION/PLAN:  The patient's chest pain would appear to be noncardiac in  etiology. He will be discharged later today so long as he does not have a  groin complication. He will follow up with Dr. Jonny Ruiz.                                                          Noralyn Pick. Eden Emms, M.D. [facility]    PCN/MEDQ  D:  07/04/2002  T:  07/04/2002  Job:  [id number]   cc:   Corwin Levins, M.D. [facility]   Madolyn Frieze. Jens Som, M.D. [facility]

## 2011-03-20 NOTE — H&P (Signed)
Harold Waters, Harold Waters                            ACCOUNT NO.:  [id number]   MEDICAL RECORD NO.:  [id number]                   PATIENT TYPE:  EMS   LOCATION:  MAJO                                 FACILITY:  [facility]   PHYSICIAN:  Creta Levin, M.D. [facility]      DATE OF BIRTH:  09-30-1946   DATE OF ADMISSION:  08/27/2003  DATE OF DISCHARGE:                                HISTORY & PHYSICAL   CARDIOLOGIST:  Olga Millers, M.D.   PRIMARY CARE PHYSICIAN:  Georgina Quint. Plotnikov, M.D. [facility]   CHIEF COMPLAINT:  Chest pain.   HISTORY OF PRESENT ILLNESS:  Harold Waters is a 65 year old male with coronary  artery disease, who complains of 8-9/10 substernal chest pain that awoke him  from sleep.  He states this pain is similar to 1997 when he had an acute  myocardial infarction.  He took nitroglycerin with partial relief and called  911.  In the emergency department, he was given more sublingual  nitroglycerin and made pain-free.  He has had no dyspnea, diaphoresis,  palpitations, presyncope, syncope, nausea.   PAST MEDICAL HISTORY:  1. Coronary artery disease.     a. Myocardial infarction in 1997 with a PCI presumably of the first        obtuse marginal.     b. PCI in 1998, presumably of the nondominant RCA.     c. Catheterization in September, 2003, revealing the following:  Ejection        fraction 60%; left main 20% distal; LAD 30% proximal; D1 normal; left        circumflex (dominant) 20 to 30% proximal; OM1 30 to 40% instent        stenosis; RCA (nondominant) 30 to 40% instent stenosis.  2. Hypertension.  3. Hyperlipidemia.  4. Low back pain.  5. Knee surgery.   ALLERGIES:  No known drug allergies.   MEDICATIONS:  1. Aspirin 81 mg daily.  2. Nitroglycerin p.r.n.   SOCIAL HISTORY:  The patient lives in [place] with his wife.  He is a  retired Hospital doctor.  He smokes two packs per week and has a 40 pack-  year history.  He does not drink alcohol.   The patient states that  he is unable to work and neither is his wife.  Because of this, he cannot afford medications and is therefore,  noncompliant.   FAMILY HISTORY:  The patient states that his mother developed coronary  disease in her 43's.  His father died of peptic ulcer disease.  He does have  one brother with coronary disease that presented in his late 83's.   REVIEW OF SYSTEMS:  He has been having some night sweats according to the  wife as well as 10-15 pounds of weight loss over the last couple of months.  About a month ago, he had some coughing, but has now, only minimal coughing.  HEENT:  He has no specific complaints.  SKIN:  No rashes.  CARDIOVASCULAR:  Chest pain as mentioned in the History of Present Illness and the coughing  that existed one month ago, but is now mostly resolved.  GU:  No complaints.  NEUROPSYCHIATRIC:  He admits to feeling tired and depressed as well as  occasional anxiety.  He states that he does have chest discomfort with his  anxiety from time to time.  GI:  He has no complaints.   CODE STATUS:  He is a full code.   PHYSICAL EXAMINATION:  VITAL SIGNS:  Temperature 97.4, blood pressure  124/72, heart rate 53.  He was saturating 98% on room air.  GENERAL:  He is in no acute distress and is very pleasant.  HEENT:  Unremarkable.  NECK:  No carotid bruits or jugular venous distention.  I do not appreciate  thyromegaly.  No lymphadenopathy.  LUNGS:  Bibasilar crackles and no wheezing.  CARDIOVASCULAR:  Regular rate and rhythm with a normal S1, normal S2, no  gallop, no murmur.  ABDOMINAL:  Unremarkable.  EXTREMITIES:  No edema.  RECTAL:  Heme negative stool.  NEUROLOGIC:  Grossly nonfocal.   STUDIES:  Chest x-ray showed no air space disease.  ECG revealed sinus  bradycardia at 48 with no evidence of ischemia.   LABORATORY DATA:  Pending.   ASSESSMENT/PLAN:  #1.  Unstable angina versus noncardiac chest pain.  The  patient was given an aspirin and started on heparin in  the emergency  department.  I will continue this until he rules out for myocardial  infarction.  I am starting a statin, but we will need to consider the best  one that is available for given cost.  Similarly, if we decide to start him  on an ACE inhibitor, will need to be done with cost consciousness in mind.  No beta blocker was given due to his heart rate of 48.  We did discuss  smoking cessation at length.   #2.  Given his complaints of depression and anxiety, it may be reasonable to  consider starting a selective serotonin reuptake inhibitor.  I have  encouraged the patient to follow up with Georgina Quint. Plotnikov, M.D. [facility]  regarding these complaints for long-term followup.   Initially, I suspect that the patient may need cardiac catheterization.  At  this point, I am not so sure given he had a recent similar episode in August  of 2003 and was found to have nonobstructive coronary disease.  It may be  reasonable to pursue a functional study should the patient rule out for  myocardial infarction.   #3.  Weight loss and night sweats.  Not quite sure what to make of these  complaints.  He denies any exposure to tuberculosis.  There is no suggestive  abnormalities on his chest x-ray.  It may be worth placing a PPD and  following this long-term.  I do not feel any of this is related to his acute  presentation.                                                 Creta Levin, M.D. [facility]    RPK/MEDQ  D:  08/27/2003  T:  08/27/2003  Job:  [id number]   cc:   Georgina Quint. Plotnikov, M.D. [place]   [place], [place].D.

## 2011-03-20 NOTE — H&P (Signed)
NAMESALLIE, [name]                            ACCOUNT NO.:  [id number]   MEDICAL RECORD NO.:  [id number]                   PATIENT TYPE:  EMS   LOCATION:  MAJO                                 FACILITY:  [facility]   PHYSICIAN:  Creta Levin, MD [facility]         DATE OF BIRTH:  December 13, 1945   DATE OF ADMISSION:  06/11/2002  DATE OF DISCHARGE:                                HISTORY & PHYSICAL   CARDIOLOGIST:  Madolyn Frieze. Jens Som, M.D. [facility]   PRIMARY CARE PHYSICIAN:  Georgina Quint. Plotnikov, M.D. [facility]   CHIEF COMPLAINT:  Chest pain.   HISTORY OF PRESENT ILLNESS:  The patient is a 65 year old male with a  history of coronary artery disease who complains of substernal chest pain,  dyspnea, nausea, and diarrhea which began this morning, waxed and waned, and  peaked at approximately 5:00 p.m. where he had 6/10 intensity.  The patient  states that he has had stable angina over the course of the last 3 years.  For the past week, he has had intermittent episodes of chest discomfort at  rest.  He has had the above associated symptoms.  When his pain peaked this  evening his daughter decided to bring him to the emergency department.  In  the emergency department, he was given sublingual nitroglycerin x3 and was  made chest-pain free.   The patient has occasional orthopnea but sleeps on 1 pillow.  He has  occasional paroxysmal nocturnal dyspnea, the last episode being  approximately one week ago.  He admits to baseline stable angina and dyspnea  on exertion with 1 block.   PAST MEDICAL HISTORY:  1. Coronary artery disease.  The patient's states he has had 2 myocardial     infarctions.  He underwent percutaneous coronary intervention of 2     vessels in 1997 and another intervention in 1998.  He says he has a total     of 3 stents.  These procedures were done in [place].  He underwent     cardiac catheterization here at [facility] approximately 3 years ago at     which time no intervention was  done.  2. Hypertension.  3. Hyperlipidemia.  4. History of chronic low back pain.  5. History of knee surgery for a traumatic injury.   MEDICATIONS:  1. Aspirin 81 mg p.o. q.d.  2. Lipitor 10 mg p.o. q.d.   ALLERGIES:  The patient states he has no known drug allergies.   SOCIAL HISTORY:  He lives in [place] with his wife.  He has an  approximate 40-pack-year smoking history and is currently smoking 3  cigarettes per day.  He denies alcohol or drugs.  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   FAMILY HISTORY:  Significant for coronary disease at age 3 in one of his 4  brothers.  He has 3 sisters without coronary disease.  His  mother is alive  at age 38 but has coronary disease.  His father died at age 42 of peptic  ulcer disease and GI bleeding.   REVIEW OF SYSTEMS:  Significant for an occasional headache but otherwise is  unremarkable.   ADVANCE DIRECTIVES:  The patient is full code.   PHYSICAL EXAMINATION:  VITAL SIGNS: On admission, temperature is 97.4, blood  pressure is 106/60, heart rate is 54.  He saturates 97% on 2 L.  GENERAL:  He is a well-developed middle-aged male in no acute distress.  HEENT:  Unremarkable.  NECK:  JVP is approximately 10 cm.  There are no bruits.  CARDIOVASCULAR:  Revealed quiet heart sounds but a normal S1, normal S2, no  gallop, no murmur.  LUNGS:  Revealed mild crackles approximately one half up bilaterally but  this cleared substantially with coughing.  ABDOMEN:  Benign.  RECTAL:  Is being performed by the admitting intern.  EXTREMITIES:  Reveal good distal pulses and no edema.  He has no femoral  bruits.   LABORATORY DATA:  Chest x-ray revealed no acute disease.   His ECG reveals sinus bradycardia with possible early repolarization  abnormalities but otherwise there is no evidence of ischemia.   Labs thus far, his white count is 6.6, hemoglobin is 14.3, hematocrit is  42.8, platelets are 266.  His total CK is  143, MB is 1.3, troponin I is  0.01.  His Chem-7 is pending.   ASSESSMENT AND PLAN:  1. Unstable angina.  The patient received aspirin in the emergency     department.  We will continue this daily.  Due to bradycardia in the 45-     50 range, we are holding a beta blocker at this time.  Also because his     blood pressure has been in the 90-100 range since admission, we are     holding an Ace inhibitor.  We will start enoxaparin at 1 mg/kg q.12h.     Should he rule in for myocardial infarction, we will add a glycoprotein     2B-3A inhibitor.   Given the patient's known history of coronary disease in a pretty typical  story for unstable angina, we will likely proceed with cardiac  catheterization either tomorrow or Tuesday.  1. Hyperlipidemia. We will check a lipid panel in the morning.  For now, we     will continue Lipitor at 10 mg p.o. q.h.s.  2. Hypertension.  The patient is currently hypotensive.  We will attempt to     add an Ace inhibitor and a beta blocker when his pressure comes back to     his normal range.  3. Tobacco abuse.  We have recommended that the patient discontinue his     smoking.  We will provide him with the appropriate followup and support.                                                Creta Levin, MD [facility]    RPK/MEDQ  D:  06/11/2002  T:  06/15/2002  Job:  [id number]

## 2011-03-20 NOTE — Discharge Summary (Signed)
Harold Waters, Harold Waters                            ACCOUNT NO.:  [id number]   MEDICAL RECORD NO.:  [id number]                   PATIENT TYPE:  INP   LOCATION:  3711                                 FACILITY:  [facility]   PHYSICIAN:  Olga Millers, M.D.                DATE OF BIRTH:  05/17/1946   DATE OF ADMISSION:  08/27/2003  DATE OF DISCHARGE:  08/28/2003                           DISCHARGE SUMMARY - REFERRING   HISTORY:  Mr. Harold Waters is a 65 year old male who presented with 8 to 9/0 to 10  chest discomfort that awoke him from sleep.  He felt the discomfort was  similar to when he had a myocardial infarction.  He took nitroglycerin with  partial relief and called 911.  En route, he was given more sublingual  nitroglycerin which relieved his discomfort.  His history is notable for  myocardial infarction in 1997 with percutaneous coronary intervention and a  percutaneous coronary intervention in  1998 as well as hypertension,  hyperlipidemia.  Last catheterization was September 2003.  Continued tobacco  use.   LABORATORY DATA:  ER markers were negative x 3.  Fasting lipids showed a  total cholesterol 239, triglycerides 361, HDL 40, LDL 127.  PT 12.1, PTT 31.  Sodium 137, potassium 3.9, BUN 16, creatinine 1.1, magnesium 2.2.  Hemoglobin 14.6, hematocrit 42.9, normal indices, platelets 226, WBC 6.7.   Chest x-ray:  No acute disease.   HOSPITAL COURSE:  Harold Waters was admitted to [facility] to evaluate  his chest discomfort.  Psych consult was also obtained on October 25  secondary to depression.  By October 26, he had not had any further  complaints.  He ruled out for myocardial infarction.  Tobacco cessation  consult was performed on August 27, 2002, and stress Cardiolite was  performed on October 26.  Imaging associated with negative EKG showed EF  51%, inferior scar, no ischemia.  Psych consult was pending; however, after  the Cardiolite imaging, Dr. Jens Som said he could be  discharged home and  follow up as an outpatient.   DISCHARGE DIAGNOSES:  1. Chest discomfort of undetermined etiology.  No myocardial infarction with     negative stress test.  2. Hyperlipidemia which is untreated.  3. Sinus bradycardia.  4. Continue tobacco use.  5. Depression with financial concerns and social stressors.   DISPOSITION:  He is discharged to home.  He received new prescriptions for  Zocor 40 mg q.h.s. and Prozac 20 mg daily.  He is asked to continue coated  aspirin 81 mg daily and nitroglycerin as needed.  Maintain low-salt, low-  fat, low-cholesterol diet.  Follow up with Dr. Jens Som on November 23 at  10:15 a.m.  He will see Dr. Posey Rea on Tuesday, November 9, at 9:30 a.m.  No smoking or tobacco products.      Harold Waters, P.A. [facility]  Olga Millers, M.D.   EW/MEDQ  D:  10/25/2003  T:  10/26/2003  Job:  [id number]   cc:   Georgina Quint. Plotnikov, M.D. [facility]

## 2011-03-20 NOTE — Assessment & Plan Note (Signed)
[facility]                                 ON-CALL NOTE   KEMPTON, MILNE                       MRN:          [id number]  DATE:01/18/2007                            DOB:          07-Jun-1946    Phone number is [phone].  The caller was Alvera Novel, who is the  son.   OBJECTIVE:  Initially spoke with the patient's son concerning heart  medications, requesting the patient's brother just passed away, and he  is going to need to travel to [place] for funeral arrangements,  and is leaving at 11 o'clock in the morning by flying out of the  airport.  Has very infrequent strong chest pain, and uses presumably  nitroglycerin.  He is asking for a sublingual medication that was  prescribed by Dr. Jens Som.  He has not used it for quite some time, at  least 1 year.  His last prescription was over 2 years ago, and he has  not had any chest pain recently, but is worried that he may have some on  his trip.   Angina.   PLAN:  I have called in a prescription for nitroglycerin 0.4 mg  sublingual 1 at onset, and may repeat 5 minutes later.  I gave him 1  bottle of 25 pills, and no refills.  The patient was encouraged to go  see Dr. Jens Som if he has regular chest pain during his trip.  Primary  care Rosemond Lyttle is Dr. Posey Rea.  Home office is [place].     Arta Silence, MD  Electronically Signed    RNS/MedQ  DD: 01/19/2007  DT: 01/19/2007  Job #: [id number]

## 2011-04-24 ENCOUNTER — Encounter: Payer: Self-pay | Admitting: Cardiology

## 2011-04-24 ENCOUNTER — Other Ambulatory Visit: Payer: Self-pay | Admitting: Cardiology

## 2011-04-24 ENCOUNTER — Encounter (INDEPENDENT_AMBULATORY_CARE_PROVIDER_SITE_OTHER): Payer: Medicare Other | Admitting: *Deleted

## 2011-04-24 DIAGNOSIS — I6529 Occlusion and stenosis of unspecified carotid artery: Secondary | ICD-10-CM

## 2011-05-05 ENCOUNTER — Encounter: Payer: Self-pay | Admitting: Cardiology

## 2011-05-07 ENCOUNTER — Ambulatory Visit (INDEPENDENT_AMBULATORY_CARE_PROVIDER_SITE_OTHER): Payer: Medicare Other | Admitting: Cardiology

## 2011-05-07 ENCOUNTER — Encounter: Payer: Self-pay | Admitting: Cardiology

## 2011-05-07 DIAGNOSIS — E785 Hyperlipidemia, unspecified: Secondary | ICD-10-CM

## 2011-05-07 DIAGNOSIS — I70219 Atherosclerosis of native arteries of extremities with intermittent claudication, unspecified extremity: Secondary | ICD-10-CM

## 2011-05-07 DIAGNOSIS — I2510 Atherosclerotic heart disease of native coronary artery without angina pectoris: Secondary | ICD-10-CM

## 2011-05-07 DIAGNOSIS — I6529 Occlusion and stenosis of unspecified carotid artery: Secondary | ICD-10-CM

## 2011-05-07 MED ORDER — ROSUVASTATIN CALCIUM 20 MG PO TABS: 20.0000 mg | ORAL_TABLET | Freq: Every day | ORAL | Status: DC

## 2011-05-07 NOTE — Progress Notes (Signed)
HPI: Pleasant male that I have seen in the past for coronary disease. He has a history of a prior stent to a small nondominant right coronary artery in [place] in 1997 per old notes. His last catheterization was performed in June of 2011. At that time he had  a 50% in-stent restenosis of the right coronary artery. There was a 30-40% LAD lesion.The first OM is subtotally occluded inside of a previously placed stent.  Attempt at PCI of the small marginal was unsuccessful and medical therapy was recommended. He had stent to left SFA in Nov 2011. Last carotid Dopplers in June of 2010 showed 0-39% stenosis bilaterally. Followup recommended in 2 years. Since I last saw him in August of 2011,  He denies dyspnea on exertion, orthopnea, PND, pedal edema or syncope. He had one episode of chest tightness for several minutes after bending over. He improved with changing positions. His claudication improved after his recent peripheral vascular procedure.  Current Outpatient Prescriptions  Medication Sig Dispense Refill  . aspirin 81 MG tablet Take 81 mg by mouth daily.        . Multiple Vitamin (MULTIVITAMIN) capsule Take 1 capsule by mouth daily.        . nitroGLYCERIN (NITROSTAT) 0.4 MG SL tablet Place 0.4 mg under the tongue every 5 (five) minutes as needed.        [name] DISCONTD: aspirin 81 MG chewable tablet Chew 81 mg by mouth daily.        [name] DISCONTD: clopidogrel (PLAVIX) 75 MG tablet Take 75 mg by mouth daily.        [name] DISCONTD: rosuvastatin (CRESTOR) 20 MG tablet Take 20 mg by mouth daily.           Past Medical History  Diagnosis Date  . Essential hypertension, benign   . Acute myocardial infarction, unspecified site, episode of care unspecified   . Other and unspecified hyperlipidemia   . Coronary atherosclerosis of unspecified type of vessel, native or graft   . PAD (peripheral artery disease)     with ABI's 0.8 on the right and 0.86 on the left  . GERD (gastroesophageal reflux disease)   . Lower  back pain   . Osteoarthritis     Past Surgical History  Procedure Date  . Arthroscopy knee w/ drilling     hx of  . Ptca   . Back surgery   . Appendectomy     hx of    History   Social History  . Marital Status: Married    Spouse Name: N/A    Number of Children: N/A  . Years of Education: N/A   Occupational History  . Not on file.   Social History Main Topics  . Smoking status: Former Smoker    Quit date: 04/03/2010  . Smokeless tobacco: Not on file  . Alcohol Use: No  . Drug Use: Not on file  . Sexually Active: Not on file   Other Topics Concern  . Not on file   Social History Narrative  . No narrative on file    ROS: no fevers or chills, productive cough, hemoptysis, dysphasia, odynophagia, melena, hematochezia, dysuria, hematuria, rash, seizure activity, orthopnea, PND, pedal edema, claudication. Remaining systems are negative.  Physical Exam: Well-developed well-nourished in no acute distress.  Skin is warm and dry.  HEENT is normal.  Neck is supple. No thyromegaly.  Chest is clear to auscultation with normal expansion.  Cardiovascular exam is regular rate and rhythm.  Abdominal exam nontender or distended. No masses palpated. Extremities show no edema. neuro grossly intact  ECG NSR, no ST changes.

## 2011-05-07 NOTE — Assessment & Plan Note (Signed)
Resume statin 

## 2011-05-07 NOTE — Assessment & Plan Note (Signed)
Much improved after peripheral intervention.

## 2011-05-07 NOTE — Assessment & Plan Note (Signed)
Continue aspirin. Resume statin. Schedule followup carotid Dopplers. 

## 2011-05-07 NOTE — Patient Instructions (Signed)
Your physician wants you to follow-up in: ONE YEAR WITH DR Shelda Pal will receive a reminder letter in the mail two months in advance. If you don't receive a letter, please call our office to schedule the follow-up appointment.   Your physician has requested that you have a carotid duplex. This test is an ultrasound of the carotid arteries in your neck. It looks at blood flow through these arteries that supply the brain with blood. Allow one hour for this exam. There are no restrictions or special instructions.   RESTART CRESTOR 20MG  ONCE DAILY  Your physician recommends that you return for lab work in: 6 WEEKS AFTER STARTING CRESTOR

## 2011-05-07 NOTE — Assessment & Plan Note (Signed)
Continue aspirin. Resume Crestor at 20 mg daily. Check lipids and liver 6 weeks later.

## 2011-05-19 ENCOUNTER — Other Ambulatory Visit: Payer: Medicare Other | Admitting: *Deleted

## 2011-06-19 ENCOUNTER — Other Ambulatory Visit (INDEPENDENT_AMBULATORY_CARE_PROVIDER_SITE_OTHER): Payer: Medicare Other | Admitting: *Deleted

## 2011-06-19 ENCOUNTER — Encounter (INDEPENDENT_AMBULATORY_CARE_PROVIDER_SITE_OTHER): Payer: Medicare Other | Admitting: *Deleted

## 2011-06-19 DIAGNOSIS — I6529 Occlusion and stenosis of unspecified carotid artery: Secondary | ICD-10-CM

## 2011-06-19 DIAGNOSIS — E785 Hyperlipidemia, unspecified: Secondary | ICD-10-CM

## 2011-06-19 LAB — LIPID PANEL
Cholesterol: 170 mg/dL (ref 0–200)
HDL: 46.8 mg/dL (ref >39.00)
LDL Cholesterol: 97 mg/dL (ref 0–99)
Total CHOL/HDL Ratio: 4
Triglycerides: 133 mg/dL (ref 0.0–149.0)
VLDL: 26.6 mg/dL (ref 0.0–40.0)

## 2011-06-19 LAB — HEPATIC FUNCTION PANEL
ALT: 26 U/L (ref 0–53)
AST: 21 U/L (ref 0–37)
Albumin: 4 g/dL (ref 3.5–5.2)
Alkaline Phosphatase: 55 U/L (ref 39–117)
Bilirubin, Direct: 0.1 mg/dL (ref 0.0–0.3)
Total Bilirubin: 0.4 mg/dL (ref 0.3–1.2)
Total Protein: 6.5 g/dL (ref 6.0–8.3)

## 2011-06-29 ENCOUNTER — Telehealth: Payer: Self-pay | Admitting: *Deleted

## 2011-06-29 DIAGNOSIS — E7800 Pure hypercholesterolemia, unspecified: Secondary | ICD-10-CM

## 2011-06-29 DIAGNOSIS — Z79899 Other long term (current) drug therapy: Secondary | ICD-10-CM

## 2011-06-29 MED ORDER — ROSUVASTATIN CALCIUM 40 MG PO TABS: 40.0000 mg | ORAL_TABLET | Freq: Every day | ORAL | Status: DC

## 2011-06-29 NOTE — Telephone Encounter (Signed)
Spoke with pt, he will increase crestor and have labs repeated in 6 weeks Deliah Goody

## 2011-06-29 NOTE — Telephone Encounter (Signed)
Message copied by Freddi Starr on Mon Jun 29, 2011  1:00 PM ------      Message from: Lewayne Bunting      Created: Sat Jun 20, 2011  9:50 AM       Change crestor to 40 mg po daily; lipids and liver in six weeks      Olga Millers

## 2011-08-19 ENCOUNTER — Other Ambulatory Visit (INDEPENDENT_AMBULATORY_CARE_PROVIDER_SITE_OTHER): Payer: Medicare Other | Admitting: *Deleted

## 2011-08-19 ENCOUNTER — Other Ambulatory Visit: Payer: Self-pay | Admitting: Cardiology

## 2011-08-19 DIAGNOSIS — E7800 Pure hypercholesterolemia, unspecified: Secondary | ICD-10-CM

## 2011-08-19 DIAGNOSIS — Z79899 Other long term (current) drug therapy: Secondary | ICD-10-CM

## 2011-08-19 LAB — HEPATIC FUNCTION PANEL
ALT: 34 U/L (ref 0–53)
AST: 24 U/L (ref 0–37)
Albumin: 4.2 g/dL (ref 3.5–5.2)
Alkaline Phosphatase: 57 U/L (ref 39–117)
Bilirubin, Direct: 0.1 mg/dL (ref 0.0–0.3)
Total Bilirubin: 0.6 mg/dL (ref 0.3–1.2)
Total Protein: 6.9 g/dL (ref 6.0–8.3)

## 2011-08-19 LAB — LIPID PANEL
Cholesterol: 206 mg/dL — ABNORMAL HIGH (ref 0–200)
HDL: 51.7 mg/dL (ref >39.00)
Total CHOL/HDL Ratio: 4
Triglycerides: 301 mg/dL — ABNORMAL HIGH (ref 0.0–149.0)
VLDL: 60.2 mg/dL — ABNORMAL HIGH (ref 0.0–40.0)

## 2011-08-19 LAB — LDL CHOLESTEROL, DIRECT: Direct LDL: 113.2 mg/dL

## 2011-08-20 ENCOUNTER — Encounter: Payer: Self-pay | Admitting: *Deleted

## 2011-10-01 ENCOUNTER — Ambulatory Visit (INDEPENDENT_AMBULATORY_CARE_PROVIDER_SITE_OTHER): Payer: Medicare Other | Admitting: Cardiovascular Disease

## 2011-10-01 ENCOUNTER — Encounter: Payer: Self-pay | Admitting: Cardiovascular Disease

## 2011-10-01 VITALS — BP 118/72 | HR 70 | Ht 65.0 in | Wt 162.0 lb

## 2011-10-01 DIAGNOSIS — I739 Peripheral vascular disease, unspecified: Secondary | ICD-10-CM

## 2011-10-01 DIAGNOSIS — I70219 Atherosclerosis of native arteries of extremities with intermittent claudication, unspecified extremity: Secondary | ICD-10-CM

## 2011-10-01 DIAGNOSIS — I6529 Occlusion and stenosis of unspecified carotid artery: Secondary | ICD-10-CM

## 2011-10-01 NOTE — Progress Notes (Signed)
HPI:  65 year old gentleman presenting for followup of peripheral arterial disease. The patient has a history of severe lifestyle limiting claudication. He underwent stenting of the left superficial femoral artery in November 2011 for treatment of severe left SFA stenosis. He had previously failed medical therapy with Pletal and a walking program. He has done well since his interventional procedure and reports no claudication symptoms. He specifically denies calf pain or tightness with walking. He has been sporadically involved in a walking program. He denies chest pain or dyspnea. He's had no rest pain or ulceration of his feet. He has experienced some hip discomfort recently.  Outpatient Encounter Prescriptions as of 10/01/2011  Medication Sig Dispense Refill  . aspirin 81 MG tablet Take 81 mg by mouth daily.        . Multiple Vitamin (MULTIVITAMIN) capsule Take 1 capsule by mouth daily.        . nitroGLYCERIN (NITROSTAT) 0.4 MG SL tablet Place 0.4 mg under the tongue every 5 (five) minutes as needed.        . rosuvastatin (CRESTOR) 40 MG tablet Take 1 tablet (40 mg total) by mouth at bedtime.  30 tablet  11    Allergies  Allergen Reactions  . Eggs Or Egg-Derived Products     REACTION: itching    Past Medical History  Diagnosis Date  . Essential hypertension, benign   . Acute myocardial infarction, unspecified site, episode of care unspecified   . Other and unspecified hyperlipidemia   . Coronary atherosclerosis of unspecified type of vessel, native or graft   . PAD (peripheral artery disease)     with ABI's 0.8 on the right and 0.86 on the left  . GERD (gastroesophageal reflux disease)   . Lower back pain   . Osteoarthritis     BP 118/72  Pulse 70  Ht 5\' 5"  (1.651 m)  Wt 73.483 kg (162 lb)  BMI 26.96 kg/m2  PHYSICAL EXAM: Pt is alert and oriented, NAD HEENT: normal Neck: JVP - normal, carotids 2+= without bruits Lungs: CTA bilaterally CV: RRR without murmur or gallop Abd:  soft, NT, Positive BS, no hepatomegaly Ext: no C/C/E, distal pulses intact and equal Skin: warm/dry no rash  ASSESSMENT AND PLAN

## 2011-10-01 NOTE — Assessment & Plan Note (Signed)
I reviewed his most recent carotid duplex scan from earlier this year. He has minimal carotid disease and is scheduled for 2 year followup.

## 2011-10-01 NOTE — Patient Instructions (Addendum)
Your physician wants you to follow-up in: ONE YEAR You will receive a reminder letter in the mail two months in advance. If you don't receive a letter, please call our office to schedule the follow-up appointment.   Your physician has requested that you have a lower extremity arterial duplex. During this test, ultrasound are used to evaluate arterial blood flow in the legs. Allow one hour for this exam. There are no restrictions or special instructions. WITH ABI'S

## 2011-10-01 NOTE — Assessment & Plan Note (Signed)
The patient is stable with complete resolution of his claudication symptoms following stenting of the left SFA. I reviewed his peripheral vascular angiogram report from 2011 and he did have moderate distal aortic stenosis. His left SFA was treated with an 8 x 40 mm self-expanding stent. His peripheral pulse exam is normal. I have recommended that he undergo repeat ABIs in the left leg arterial duplex. He was encouraged to get back into a regular walking program for exercise. I would like to see him back in one year for followup.

## 2011-10-12 ENCOUNTER — Encounter: Payer: Self-pay | Admitting: Internal Medicine

## 2011-10-12 ENCOUNTER — Ambulatory Visit (INDEPENDENT_AMBULATORY_CARE_PROVIDER_SITE_OTHER)
Admission: RE | Admit: 2011-10-12 | Discharge: 2011-10-12 | Disposition: A | Discharge status: Home / Self Care | Payer: Medicare Other | Source: Ambulatory Visit | Attending: Internal Medicine | Admitting: Internal Medicine

## 2011-10-12 ENCOUNTER — Ambulatory Visit (INDEPENDENT_AMBULATORY_CARE_PROVIDER_SITE_OTHER): Payer: Medicare Other | Admitting: Internal Medicine

## 2011-10-12 VITALS — BP 100/60 | HR 80 | Temp 99.2°F | Resp 16 | Wt 157.0 lb

## 2011-10-12 DIAGNOSIS — J189 Pneumonia, unspecified organism: Secondary | ICD-10-CM

## 2011-10-12 DIAGNOSIS — F17200 Nicotine dependence, unspecified, uncomplicated: Secondary | ICD-10-CM

## 2011-10-12 DIAGNOSIS — I2510 Atherosclerotic heart disease of native coronary artery without angina pectoris: Secondary | ICD-10-CM

## 2011-10-12 DIAGNOSIS — M2550 Pain in unspecified joint: Secondary | ICD-10-CM

## 2011-10-12 HISTORY — DX: Pneumonia, unspecified organism: J18.9

## 2011-10-12 MED ORDER — CEFUROXIME AXETIL 500 MG PO TABS: 500.0000 mg | ORAL_TABLET | Freq: Two times a day (BID) | ORAL | Status: AC

## 2011-10-12 MED ORDER — AZITHROMYCIN 250 MG PO TABS: ORAL_TABLET | ORAL | Status: AC

## 2011-10-12 MED ORDER — PROMETHAZINE-CODEINE 6.25-10 MG/5ML PO SYRP: 5.0000 mL | ORAL_SOLUTION | ORAL | Status: AC | PRN

## 2011-10-12 NOTE — Progress Notes (Signed)
  Subjective:    Patient ID: Harold Waters, male    DOB: Mar 28, 1946, 65 y.o.   MRN: [id number]  HPI   HPI  C/o URI sx's x  7 days. C/o ST, cough, weakness. Not better with OTC medicines. Actually, the patient is getting worse. The patient did not sleep last night due to cough.  Review of Systems  Constitutional: Positive for fever, chills and fatigue.  HENT: Positive for congestion, rhinorrhea, sneezing and postnasal drip.   Eyes: Positive for photophobia and pain. Negative for discharge and visual disturbance.  Respiratory: Positive for cough and wheezing.   Positive for chest pain.  Gastrointestinal: Negative for vomiting, abdominal pain, diarrhea and abdominal distention.  Genitourinary: Negative for dysuria and difficulty urinating.  Skin: Negative for rash.  Neurological: Positive for dizziness, weakness and light-headedness.      Review of Systems     Objective:   Physical Exam  Constitutional: He is oriented to person, place, and time. He appears well-developed.       tired  HENT:       eryth throat  Eyes: Conjunctivae are normal. Pupils are equal, round, and reactive to light.  Neck: Normal range of motion. No JVD present. No thyromegaly present.  Cardiovascular: Normal rate, regular rhythm, normal heart sounds and intact distal pulses.  Exam reveals no gallop and no friction rub.   No murmur heard. Pulmonary/Chest: Effort normal. No respiratory distress. He has no wheezes. He has rales (R base). He exhibits no tenderness.  Abdominal: Soft. Bowel sounds are normal. He exhibits no distension and no mass. There is no tenderness. There is no rebound and no guarding.  Musculoskeletal: Normal range of motion. He exhibits no edema and no tenderness.  Lymphadenopathy:    He has no cervical adenopathy.  Neurological: He is alert and oriented to person, place, and time. He has normal reflexes. No cranial nerve deficit. He exhibits normal muscle tone. Coordination normal.    Skin: Skin is warm and dry. No rash noted.  Psychiatric: He has a normal mood and affect. His behavior is normal. Judgment and thought content normal.          Assessment & Plan:

## 2011-10-12 NOTE — Assessment & Plan Note (Signed)
No angina 

## 2011-10-12 NOTE — Assessment & Plan Note (Signed)
CXR Zpac Ceftin Prom/cod syr

## 2011-10-12 NOTE — Assessment & Plan Note (Signed)
He has quit a while ago

## 2011-10-12 NOTE — Assessment & Plan Note (Signed)
12/12 due to fever

## 2011-10-12 NOTE — Patient Instructions (Signed)
Use over-the-counter  "cold" medicines  such as "Tylenol cold" , "Advil cold",  "Mucinex" or" Mucinex D"  for cough and congestion.   Avoid decongestants if you have high blood pressure and use "Afrin" nasal spray for nasal congestion as directed instead. Use" Delsym" or" Robitussin" cough syrup varietis for cough.  You can use plain "Tylenol" or "Advil" for fever, chills and achyness.  Please, make an appointment if you are not better or if you're worse.  

## 2011-10-13 ENCOUNTER — Telehealth: Payer: Self-pay | Admitting: Internal Medicine

## 2011-10-13 NOTE — Telephone Encounter (Signed)
Misty Stanley, please, inform patient that cxr is ok Thx

## 2011-10-14 NOTE — Telephone Encounter (Signed)
Pt informed

## 2011-10-28 ENCOUNTER — Encounter (INDEPENDENT_AMBULATORY_CARE_PROVIDER_SITE_OTHER): Payer: Medicare Other | Admitting: Cardiology

## 2011-10-28 DIAGNOSIS — I70219 Atherosclerosis of native arteries of extremities with intermittent claudication, unspecified extremity: Secondary | ICD-10-CM

## 2011-10-28 DIAGNOSIS — I739 Peripheral vascular disease, unspecified: Secondary | ICD-10-CM

## 2012-02-18 ENCOUNTER — Telehealth: Payer: Self-pay | Admitting: Cardiology

## 2012-02-18 ENCOUNTER — Encounter (HOSPITAL_COMMUNITY): Payer: Self-pay | Admitting: *Deleted

## 2012-02-18 ENCOUNTER — Emergency Department (HOSPITAL_COMMUNITY): Payer: Medicare HMO

## 2012-02-18 ENCOUNTER — Inpatient Hospital Stay (HOSPITAL_COMMUNITY)
Admission: EM | Admit: 2012-02-18 | Discharge: 2012-02-19 | DRG: 287 | Disposition: A | Discharge status: Home / Self Care | Payer: Medicare HMO | Source: Ambulatory Visit | Attending: Cardiology | Admitting: Cardiology

## 2012-02-18 DIAGNOSIS — Z9861 Coronary angioplasty status: Secondary | ICD-10-CM

## 2012-02-18 DIAGNOSIS — E785 Hyperlipidemia, unspecified: Secondary | ICD-10-CM | POA: Diagnosis present

## 2012-02-18 DIAGNOSIS — Y92009 Unspecified place in unspecified non-institutional (private) residence as the place of occurrence of the external cause: Secondary | ICD-10-CM

## 2012-02-18 DIAGNOSIS — Z23 Encounter for immunization: Secondary | ICD-10-CM

## 2012-02-18 DIAGNOSIS — I200 Unstable angina: Secondary | ICD-10-CM

## 2012-02-18 DIAGNOSIS — Z87891 Personal history of nicotine dependence: Secondary | ICD-10-CM

## 2012-02-18 DIAGNOSIS — I471 Supraventricular tachycardia, unspecified: Secondary | ICD-10-CM

## 2012-02-18 DIAGNOSIS — Z91012 Allergy to eggs: Secondary | ICD-10-CM

## 2012-02-18 DIAGNOSIS — I252 Old myocardial infarction: Secondary | ICD-10-CM

## 2012-02-18 DIAGNOSIS — Z8249 Family history of ischemic heart disease and other diseases of the circulatory system: Secondary | ICD-10-CM

## 2012-02-18 DIAGNOSIS — T82897A Other specified complication of cardiac prosthetic devices, implants and grafts, initial encounter: Principal | ICD-10-CM | POA: Diagnosis present

## 2012-02-18 DIAGNOSIS — G4730 Sleep apnea, unspecified: Secondary | ICD-10-CM | POA: Diagnosis present

## 2012-02-18 DIAGNOSIS — R51 Headache: Secondary | ICD-10-CM

## 2012-02-18 DIAGNOSIS — Y840 Cardiac catheterization as the cause of abnormal reaction of the patient, or of later complication, without mention of misadventure at the time of the procedure: Secondary | ICD-10-CM | POA: Diagnosis present

## 2012-02-18 DIAGNOSIS — Z79899 Other long term (current) drug therapy: Secondary | ICD-10-CM

## 2012-02-18 DIAGNOSIS — Z86718 Personal history of other venous thrombosis and embolism: Secondary | ICD-10-CM

## 2012-02-18 DIAGNOSIS — I10 Essential (primary) hypertension: Secondary | ICD-10-CM | POA: Diagnosis present

## 2012-02-18 DIAGNOSIS — I739 Peripheral vascular disease, unspecified: Secondary | ICD-10-CM | POA: Diagnosis present

## 2012-02-18 DIAGNOSIS — I2510 Atherosclerotic heart disease of native coronary artery without angina pectoris: Secondary | ICD-10-CM | POA: Diagnosis present

## 2012-02-18 DIAGNOSIS — Z7982 Long term (current) use of aspirin: Secondary | ICD-10-CM

## 2012-02-18 DIAGNOSIS — K219 Gastro-esophageal reflux disease without esophagitis: Secondary | ICD-10-CM | POA: Diagnosis present

## 2012-02-18 HISTORY — DX: Pneumonia, unspecified organism: J18.9

## 2012-02-18 HISTORY — DX: Acute embolism and thrombosis of unspecified deep veins of unspecified lower extremity: I82.409

## 2012-02-18 HISTORY — DX: Angina pectoris, unspecified: I20.9

## 2012-02-18 HISTORY — DX: Acute myocardial infarction, unspecified: I21.9

## 2012-02-18 HISTORY — DX: Sleep apnea, unspecified: G47.30

## 2012-02-18 HISTORY — DX: Supraventricular tachycardia: I47.1

## 2012-02-18 HISTORY — DX: Headache: R51

## 2012-02-18 HISTORY — DX: Supraventricular tachycardia, unspecified: I47.10

## 2012-02-18 HISTORY — DX: Shortness of breath: R06.02

## 2012-02-18 LAB — DIFFERENTIAL
Basophils Absolute: 0 10*3/uL (ref 0.0–0.1)
Basophils Relative: 0 % (ref 0–1)
Eosinophils Absolute: 0.7 10*3/uL (ref 0.0–0.7)
Eosinophils Relative: 12 % — ABNORMAL HIGH (ref 0–5)
Lymphocytes Relative: 29 % (ref 12–46)
Lymphs Abs: 1.7 10*3/uL (ref 0.7–4.0)
Monocytes Absolute: 0.5 10*3/uL (ref 0.1–1.0)
Monocytes Relative: 7 % (ref 3–12)
Neutro Abs: 3.1 10*3/uL (ref 1.7–7.7)
Neutrophils Relative %: 52 % (ref 43–77)

## 2012-02-18 LAB — CBC
HCT: 38.2 % — ABNORMAL LOW (ref 39.0–52.0)
Hemoglobin: 13.1 g/dL (ref 13.0–17.0)
MCH: 29.9 pg (ref 26.0–34.0)
MCHC: 34.3 g/dL (ref 30.0–36.0)
MCV: 87.2 fL (ref 78.0–100.0)
Platelets: 229 10*3/uL (ref 150–400)
RBC: 4.38 MIL/uL (ref 4.22–5.81)
RDW: 14.2 % (ref 11.5–15.5)
WBC: 6.1 10*3/uL (ref 4.0–10.5)

## 2012-02-18 LAB — BASIC METABOLIC PANEL
BUN: 23 mg/dL (ref 6–23)
CO2: 24 mEq/L (ref 19–32)
Calcium: 9 mg/dL (ref 8.4–10.5)
Chloride: 108 mEq/L (ref 96–112)
Creatinine, Ser: 1.09 mg/dL (ref 0.50–1.35)
GFR calc Af Amer: 80 mL/min — ABNORMAL LOW (ref >90)
GFR calc non Af Amer: 69 mL/min — ABNORMAL LOW (ref >90)
Glucose, Bld: 84 mg/dL (ref 70–99)
Potassium: 4.3 mEq/L (ref 3.5–5.1)
Sodium: 142 mEq/L (ref 135–145)

## 2012-02-18 LAB — POCT I-STAT TROPONIN I: Troponin i, poc: 0.01 ng/mL (ref 0.00–0.08)

## 2012-02-18 LAB — CARDIAC PANEL(CRET KIN+CKTOT+MB+TROPI)
CK, MB: 2.1 ng/mL (ref 0.3–4.0)
Relative Index: 2.1 (ref 0.0–2.5)
Total CK: 100 U/L (ref 7–232)
Troponin I: <0.3 ng/mL (ref <0.30)

## 2012-02-18 MED ORDER — ZOLPIDEM TARTRATE 5 MG PO TABS: 5.0000 mg | ORAL_TABLET | Freq: Every evening | ORAL | Status: DC | PRN

## 2012-02-18 MED ORDER — ACETAMINOPHEN 325 MG PO TABS: 650.0000 mg | ORAL_TABLET | ORAL | Status: DC | PRN

## 2012-02-18 MED ORDER — HEPARIN BOLUS VIA INFUSION
3600.0000 [IU] | Freq: Once | INTRAVENOUS | Status: AC
  Administered 2012-02-18: 3600 [IU] via INTRAVENOUS

## 2012-02-18 MED ORDER — SODIUM CHLORIDE 0.9 % IV SOLN: 250.0000 mL | INTRAVENOUS | Status: DC | PRN

## 2012-02-18 MED ORDER — SODIUM CHLORIDE 0.9 % IV SOLN
INTRAVENOUS | Status: DC
  Administered 2012-02-18 – 2012-02-19 (×2): via INTRAVENOUS

## 2012-02-18 MED ORDER — ALPRAZOLAM 0.25 MG PO TABS: 0.2500 mg | ORAL_TABLET | Freq: Two times a day (BID) | ORAL | Status: DC | PRN

## 2012-02-18 MED ORDER — ASPIRIN EC 81 MG PO TBEC
81.0000 mg | DELAYED_RELEASE_TABLET | Freq: Every day | ORAL | Status: DC
  Filled 2012-02-18: qty 1

## 2012-02-18 MED ORDER — DIAZEPAM 5 MG PO TABS
5.0000 mg | ORAL_TABLET | ORAL | Status: AC
  Administered 2012-02-19: 5 mg via ORAL
  Filled 2012-02-18: qty 1

## 2012-02-18 MED ORDER — ATORVASTATIN CALCIUM 80 MG PO TABS
80.0000 mg | ORAL_TABLET | Freq: Every day | ORAL | Status: DC
  Filled 2012-02-18: qty 1

## 2012-02-18 MED ORDER — HEPARIN (PORCINE) IN NACL 100-0.45 UNIT/ML-% IJ SOLN
900.0000 [IU]/h | INTRAMUSCULAR | Status: AC
  Administered 2012-02-18: 900 [IU]/h via INTRAVENOUS
  Filled 2012-02-18: qty 250

## 2012-02-18 MED ORDER — ASPIRIN 81 MG PO CHEW
324.0000 mg | CHEWABLE_TABLET | ORAL | Status: AC
  Administered 2012-02-19: 324 mg via ORAL
  Filled 2012-02-18: qty 4

## 2012-02-18 MED ORDER — METOPROLOL TARTRATE 12.5 MG HALF TABLET
12.5000 mg | ORAL_TABLET | Freq: Two times a day (BID) | ORAL | Status: DC
  Administered 2012-02-18 – 2012-02-19 (×2): 12.5 mg via ORAL
  Filled 2012-02-18 (×3): qty 1

## 2012-02-18 MED ORDER — ONDANSETRON HCL 4 MG/2ML IJ SOLN: 4.0000 mg | Freq: Four times a day (QID) | INTRAMUSCULAR | Status: DC | PRN

## 2012-02-18 MED ORDER — SODIUM CHLORIDE 0.9 % IJ SOLN: 3.0000 mL | INTRAMUSCULAR | Status: DC | PRN

## 2012-02-18 MED ORDER — HEPARIN (PORCINE) IN NACL 100-0.45 UNIT/ML-% IJ SOLN
900.0000 [IU]/h | INTRAMUSCULAR | Status: DC
  Administered 2012-02-18: 900 [IU]/h via INTRAVENOUS
  Filled 2012-02-18 (×3): qty 250

## 2012-02-18 MED ORDER — SODIUM CHLORIDE 0.9 % IJ SOLN
3.0000 mL | Freq: Two times a day (BID) | INTRAMUSCULAR | Status: DC
  Administered 2012-02-19: 3 mL via INTRAVENOUS

## 2012-02-18 MED ORDER — NITROGLYCERIN 0.4 MG SL SUBL: 0.4000 mg | SUBLINGUAL_TABLET | SUBLINGUAL | Status: DC | PRN

## 2012-02-18 MED ORDER — ASPIRIN 81 MG PO TABS: 81.0000 mg | ORAL_TABLET | Freq: Every day | ORAL | Status: DC

## 2012-02-18 MED ORDER — SODIUM CHLORIDE 0.9 % IJ SOLN: 3.0000 mL | Freq: Two times a day (BID) | INTRAMUSCULAR | Status: DC

## 2012-02-18 MED ORDER — PNEUMOCOCCAL VAC POLYVALENT 25 MCG/0.5ML IJ INJ
0.5000 mL | INJECTION | INTRAMUSCULAR | Status: DC
  Filled 2012-02-18: qty 0.5

## 2012-02-18 MED ORDER — ASPIRIN 81 MG PO CHEW
324.0000 mg | CHEWABLE_TABLET | Freq: Once | ORAL | Status: AC
  Administered 2012-02-18: 324 mg via ORAL
  Filled 2012-02-18: qty 4

## 2012-02-18 NOTE — ED Notes (Signed)
Reports having intermittent chest pains for over one month, became more severe today with dizziness. ekg done at triage. Airway intact.

## 2012-02-18 NOTE — ED Provider Notes (Signed)
Medical screening examination/treatment/procedure(s) were performed by non-physician practitioner and as supervising physician I was immediately available for consultation/collaboration.  Crystalee Ventress, MD 02/18/12 1748 

## 2012-02-18 NOTE — ED Notes (Signed)
Patient transported to X-ray 

## 2012-02-18 NOTE — ED Provider Notes (Signed)
4:19 PM Patient care resumed from [facility], [place].  Patient presents emergency Department with chief complaint of 2 months of intermittent chest pain that has worsened since yesterday.  Patient has a history of CAD and stent placement by Dr. Jens Som.  Adolph Pollack cardiology to see patient in the emergency department to decide whether patient needs to be admitted versus followup as an outpatient.  Patient has been reevaluated and is medically stable, in no acute distress, and currently asymptomatic.  Workup while in the ED includes troponin negative times one and chest x-ray without any acute cardiac or pulmonary findings.  CBC and BMP within normal limits.  Consult pending disposition.   5:14 PM Adolph Pollack to admit patient. The patient appears reasonably stabilized for admission considering the current resources, flow, and capabilities available in the ED at this time, and I doubt any other [facility] requiring further screening and/or treatment in the ED prior to admission.  Jaci Carrel, [place] 02/18/12 1714

## 2012-02-18 NOTE — Telephone Encounter (Signed)
Daughter callls. Pt has had another episode of witnessed chest pain. They are taking him to [facility] ED. Mylo Red RN

## 2012-02-18 NOTE — Progress Notes (Signed)
ANTICOAGULATION CONSULT NOTE - Initial Consult  Pharmacy Consult for heparin Indication: unstable angina  Allergies  Allergen Reactions  . Eggs Or Egg-Derived Products     REACTION: itching    Patient Measurements:   Heparin Dosing Weight: 72.1kg  Vital Signs: Temp: 98.1 F (36.7 C) (04/18 1345) Temp src: Oral (04/18 1345) BP: 118/68 mmHg (04/18 1645) Pulse Rate: 54  (04/18 1645)  Labs:  Basename 02/18/12 1438 02/18/12 1412  HGB 13.1 --  HCT 38.2* --  PLT 229 --  APTT -- --  LABPROT -- --  INR -- --  HEPARINUNFRC -- --  CREATININE -- 1.09  CKTOTAL -- --  CKMB -- --  TROPONINI -- --   The CrCl is unknown because both a height and weight (above a minimum accepted value) are required for this calculation.  Medical History: Past Medical History  Diagnosis Date  . Essential hypertension, benign   . Acute myocardial infarction, unspecified site, episode of care unspecified   . Other and unspecified hyperlipidemia   . Coronary atherosclerosis of unspecified type of vessel, native or graft   . PAD (peripheral artery disease)     with ABI's 0.8 on the right and 0.86 on the left  . GERD (gastroesophageal reflux disease)   . Lower back pain   . Osteoarthritis     Medications:  Scheduled:    . aspirin  324 mg Oral Once   Infusions:    Assessment: 66 yo male with unstable angina will be started on heparin therapy. H/H 13.1/38.2; Plt 229. No ton coumadin prior to admission.  Potential cath tom.  Goal of Therapy:  Heparin level 0.3-0.7 units/ml   Plan:  1) Heparin bolus 3600 units iv x1, then start heparin drip at 900 units/hr.  2) Check an 6 hour heparin level after drip is started. 3) Daily heparin level and CBC  Luka Stohr, Tsz-Yin 02/18/2012,5:19 PM

## 2012-02-18 NOTE — Telephone Encounter (Signed)
Patient states has been having a dull pain in his chest  that goes to his head and gets dizzy, this has been going on for more then a month.  Today pt is having the pain  more often. Patient has not taken SL  Nitroglycerin.The headache gets worse when bending down. He does not have a way to take his B/P now. He thinks is not the B/P that is bothering him.  Patient has not taken plavix 75 mg for a while , because he said  the last time he was in for an O.V, did not get a prescription for that medication. He would like to be seen soon.

## 2012-02-18 NOTE — ED Provider Notes (Signed)
Medical screening examination/treatment/procedure(s) were performed by non-physician practitioner and as supervising physician I was immediately available for consultation/collaboration.   Serafina Topham, MD 02/18/12 2343 

## 2012-02-18 NOTE — H&P (Signed)
Cardiology Admission Note   Patient ID: Harold Waters MRN: [id number], DOB/AGE: [birth year]   Admit date: 02/18/2012 Date of Consult: 02/18/2012  Primary Physician: Sonda Primes, MD, MD Primary Cardiologist: Olga Millers, MD   Pt. Profile: Harold Waters is a 66yo male with PMHx significant for CAD (s/p remote unspecified RCA, OM stenting 1997; cardiac cath 06/11 with 50% ISR to RCA stent, 30-40% LAD stenosis, subtotal occlusion of prior OM stent with unsuccessful PCI; EF 60%, medically managed), PAD (s/p left SFA stent 11/11), left carotid dopplers (0-39% bilateral stenosis 06/10), HTN, HL and GERD who presents to [facility] ED today with a complaint of chest pain.   Problem List: Past Medical History  Diagnosis Date  . Essential hypertension, benign   . Acute myocardial infarction, unspecified site, episode of care unspecified   . Other and unspecified hyperlipidemia   . Coronary atherosclerosis of unspecified type of vessel, native or graft   . PAD (peripheral artery disease)     with ABI's 0.8 on the right and 0.86 on the left  . GERD (gastroesophageal reflux disease)   . Lower back pain   . Osteoarthritis     Past Surgical History  Procedure Date  . Arthroscopy knee w/ drilling     hx of  . Ptca   . Back surgery   . Appendectomy     hx of     Allergies:  Allergies  Allergen Reactions  . Eggs Or Egg-Derived Products     REACTION: itching    HPI:   The patient was last seen in the office in 07/12. At that time, he was relatively stable reporting only rare episodes of self-limiting chest pain. Today, he called the office complaining of intermittent chest pain > 1 month with associated shortness of breath, dizziness and worsening. Patient reportedly has not been taking Plavix for a while.   He reports increasing frequency and severity of chest pain- two episodes yesterday and one episode today- with associated lightheadedness and headache, some diaphoresis. He states that this  can reliably be produced upon bending over, but has recently occurred at rest. The chest pain is located in the lower neck with radiation to jaw on occasion, dull, lasting for seconds at a time. In the past, his chest pain has been relieved with NTG, however was unrelieved by NTG today. Rated at a 6/10. Denies shortness of breath, palpitations, orthopnea, decrease in activity level (mows lawn, walks frequently), PND, LE swelling, fevers, chills, n/v/d, sick contacts. Of note, the patient does endorse excess caffeine intake (>2 cups of coffee daily).   In the ED, POC TnI WNL. CBC, BMET unremarkable. CXR without acute abnormalities. EKG reveals NSR with self-limiting PSVT and occasional PVCs. VSS. Patient currently asymptomatic.   Home Medications: Prior to Admission medications   Medication Sig Start Date End Date Taking? Authorizing Provider  aspirin 81 MG tablet Take 81 mg by mouth daily.     Yes Historical Provider, MD  Multiple Vitamin (MULTIVITAMIN) capsule Take 1 capsule by mouth daily.     Yes Historical Provider, MD  nitroGLYCERIN (NITROSTAT) 0.4 MG SL tablet Place 0.4 mg under the tongue every 5 (five) minutes as needed. For chest pain   Yes Historical Provider, MD  rosuvastatin (CRESTOR) 40 MG tablet Take 40 mg by mouth at bedtime.   Yes Lewayne Bunting, MD    Inpatient Medications:     . aspirin  324 mg Oral Once    (Not in a hospital admission)  Family History  Problem Relation Age of Onset  . Coronary artery disease Brother     male 1st degree relative <50     History   Social History  . Marital Status: Married    Spouse Name: N/A    Number of Children: N/A  . Years of Education: N/A   Occupational History  . Not on file.   Social History Main Topics  . Smoking status: Former Smoker    Quit date: 04/03/2010  . Smokeless tobacco: Not on file  . Alcohol Use: No  . Drug Use: Not on file  . Sexually Active: Not on file   Other Topics Concern  . Not on file    Social History Narrative  . No narrative on file     Review of Systems: General: negative for chills, fever, night sweats or weight changes.  Cardiovascular: positive for chest pain, shortness of breath, negative for dyspnea on exertion, edema, orthopnea, palpitations, paroxysmal nocturnal dyspnea  Dermatological: negative for rash Respiratory: negative for cough or wheezing Urologic:  negative for hematuria Abdominal: he reports occasional stomach discomfort, negative for nausea, vomiting, diarrhea, bright red blood per rectum, melena, or hematemesis Extremities: negative for claudication, pallor, paresthesias, poikilothermia Neurologic:  negative for visual changes, syncope, or dizziness All other systems reviewed and are otherwise negative except as noted above.  Physical Exam: Blood pressure 103/63, pulse 62, temperature 98.1 F (36.7 C), temperature source Oral, resp. rate 14, SpO2 97.00%.   General:  Well developed, well nourished, in no acute distress. Head:  Normocephalic, atraumatic, sclera non-icteric, no xanthomas Neck:  Negative for carotid bruits. JVD not elevated. Lungs:  Clear bilaterally to auscultation without wheezes, rales, or rhonchi. Breathing is unlabored. Heart: Mildly bradycardic, with S1 S2. No murmurs, rubs, or gallops appreciated. Abdomen:  Soft, non-tender, non-distended with normoactive bowel sounds. No hepatomegaly. No rebound/guarding. No obvious abdominal masses. Msk:   Strength and tone appears normal for age. Extremities:  No clubbing, cyanosis or edema.  No appreciable bruits. Distal pedal pulses are 2+ and equal bilaterally. Neuro:  Alert and oriented X 3. Moves all extremities spontaneously. Psych:   Responds to questions appropriately with a normal affect.  Labs: Recent Labs  Basename 02/18/12 1438   WBC 6.1   HGB 13.1   HCT 38.2*   MCV 87.2   PLT 229    Lab 02/18/12 1412  NA 142  K 4.3  CL 108  CO2 24  BUN 23  CREATININE 1.09   CALCIUM 9.0  PROT --  BILITOT --  ALKPHOS --  ALT --  AST --  AMYLASE --  LIPASE --  GLUCOSE 84   Radiology/Studies: Dg Chest 2 View  02/18/2012  *RADIOLOGY REPORT*  Clinical Data: Chest pain.  CHEST - 2 VIEW  Comparison: Chest 10/12/2011.  Findings: Lungs are clear.  Heart size is normal.  No pneumothorax or effusion.  IMPRESSION: Negative chest.  Original Report Authenticated By: Bernadene Bell. D'ALESSIO, M.D.   EKG: NSR, PSVT lasting 5-7 beats, occasional PVCs, no ST-T wave changes  ASSESSMENT AND PLAN:   Harold Waters is a 66yo male with PMHx significant for CAD (s/p remote unspecified RCA, OM stenting 1997; cardiac cath 06/11 with 50% ISR to RCA stent, 30-40% LAD stenosis, subtotal occlusion of prior OM stent with unsuccessful PCI, medically managed), PAD (s/p left SFA stent 11/11), left carotid dopplers (0-39% bilateral stenosis 06/10), HTN, HL and GERD who presents to [facility] ED today with a complaint of chest pain.  1. Unstable angina- the patient has documented vasculopathy- a prior history of CAD, PAD requiring intervention and carotid artery disease. He does have significant cardiac risk factors in HTN, HL and history of tobacco abuse. Over the past month, he reports increasing frequency and severity of chest pain, now occurring at rest, when it had been reliably provoked on certain types of exertion (bending over). No remarkable findings on exam. EKG with evidence of a new PSVT. POC TnI WNL. He has a high ACS risk. Given the above information, the patient's history is concerning for unstable angina.   - Admit to telemetry  - Continue ASA/BB/statin/NTG SL PRN  - Plan for cath tomorrow  - Heparin  - NPO midnight  - Daily EKG/BMET/CBC   2. PSVT- short, self-limiting paroxysms noted on EKG and telemetry today. No prior documentation of this. Electrolytes WNL. Patient does endorse excess caffeine intake.   - Add BB  3. Carotid artery disease- stable; quantified above, repeat dopplers in 2  years.   4. PAD- stable noted on follow-up with Dr. Excell Seltzer. Denies claudication, walking daily, no decrease in activity level.   5. Hypertension- very-well controlled  6. Hyperlipidemia  - Continue statin  Signed, R. Hurman Horn, PA-C 02/18/2012, 4:31 PM  Patient seen with PA, agree with note.  For several weeks, he has been having exertional chest tightness when walking up steps at the [employer] (cle[place] the stadium).  Today, he developed chest tightness this am at rest that persisted for several hours.  Now resolved.  Point of care troponin is normal.  ECG shows not acute ischemic changes but he has had several short runs of SVT (that he does not note).  1. CAD: Symptoms concerning for unstable angina.  Would favor admission for cath in am.  Will start on heparin gtt for now.  Cycle cardiac enzymes.  2. SVT: Short runs (5-6 beats) SVT.  Will add low dose beta blocker.   Marca Ancona 02/18/2012 5:16 PM

## 2012-02-18 NOTE — Telephone Encounter (Signed)
Pt having chest pain off and on for a week and a half, with SOB, dtr randy Tapanes [phone], requesting appt today

## 2012-02-18 NOTE — ED Provider Notes (Signed)
History     CSN: [id number]  Arrival date & time 02/18/12  1339   First MD Initiated Contact with Patient 02/18/12 1349      Chief Complaint  Patient presents with  . Chest Pain    (Consider location/radiation/quality/duration/timing/severity/associated sxs/prior treatment) HPI  Patient with history of coronary artery disease, stent placement, who is followed by Dr. Jens Som with [facility] cardiology, with most recent heart catheterization approximately 2011 presents to the emergency department complaining of a one to two-month history of intermittent exertional chest discomfort stating that yesterday and today the chest discomfort has become more severe. Patient states that pain is brought on by exertion and improved with rest. He notes that when he was taking his grandson to school today he noticed multiple episodes of severe mid chest pain with radiation of pain up towards his head and feeling of lightheadedness. Patient states symptoms last for seconds to minutes and then resolved. Patient took nitroglycerin without relief of symptoms. Patient has not taken his daily aspirin. Patient denies fevers, chills, shortness of breath, abdominal pain, nausea, vomiting, diarrhea, recent illness, or cough. He denies lower extremity pain or swelling.  Past Medical History  Diagnosis Date  . Essential hypertension, benign   . Acute myocardial infarction, unspecified site, episode of care unspecified   . Other and unspecified hyperlipidemia   . Coronary atherosclerosis of unspecified type of vessel, native or graft   . PAD (peripheral artery disease)     with ABI's 0.8 on the right and 0.86 on the left  . GERD (gastroesophageal reflux disease)   . Lower back pain   . Osteoarthritis     Past Surgical History  Procedure Date  . Arthroscopy knee w/ drilling     hx of  . Ptca   . Back surgery   . Appendectomy     hx of    Family History  Problem Relation Age of Onset  . Coronary artery  disease      male 1st degree relative <50    History  Substance Use Topics  . Smoking status: Former Smoker    Quit date: 04/03/2010  . Smokeless tobacco: Not on file  . Alcohol Use: No      Review of Systems  All other systems reviewed and are negative.    Allergies  Eggs or egg-derived products  Home Medications   Current Outpatient Rx  Name Route Sig Dispense Refill  . ASPIRIN 81 MG PO TABS Oral Take 81 mg by mouth daily.      . MULTIVITAMINS PO CAPS Oral Take 1 capsule by mouth daily.      [name] NITROGLYCERIN 0.4 MG SL SUBL Sublingual Place 0.4 mg under the tongue every 5 (five) minutes as needed. For chest pain    . ROSUVASTATIN CALCIUM 40 MG PO TABS Oral Take 40 mg by mouth at bedtime.      BP 103/63  Pulse 62  Temp(Src) 98.1 F (36.7 C) (Oral)  Resp 14  SpO2 97%  Physical Exam  Nursing note and vitals reviewed. Constitutional: He is oriented to person, place, and time. He appears well-developed and well-nourished. No distress.  HENT:  Head: Normocephalic and atraumatic.  Eyes: Conjunctivae are normal.  Neck: Normal range of motion. Neck supple.  Cardiovascular: Normal rate, regular rhythm, normal heart sounds and intact distal pulses.  Exam reveals no gallop and no friction rub.   No murmur heard. Pulmonary/Chest: Effort normal and breath sounds normal. No respiratory distress. He has no  wheezes. He has no rales. He exhibits no tenderness.  Abdominal: Soft. Bowel sounds are normal. He exhibits no distension and no mass. There is no tenderness. There is no rebound and no guarding.  Musculoskeletal: Normal range of motion. He exhibits no edema and no tenderness.  Neurological: He is alert and oriented to person, place, and time.  Skin: Skin is warm and dry. No rash noted. He is not diaphoretic. No erythema.  Psychiatric: He has a normal mood and affect.    ED Course  Procedures (including critical care time)  PO ASA  3:11 PM Patient remains CP free.  [facility] to see patient in ER with dispo pending evaluation    Date: 02/18/2012  Rate: 91  Rhythm: intermittent a fib with PVC  QRS Axis: normal  Intervals: QT prolonged  ST/T Wave abnormalities: normal  Conduction Disutrbances:none  Narrative Interpretation:  Non provocative but changed compared to Sep 17, 2010  Old EKG Reviewed: changes noted   Labs Reviewed  CBC - Abnormal; Notable for the following:    HCT 38.2 (*)    All other components within normal limits  DIFFERENTIAL - Abnormal; Notable for the following:    Eosinophils Relative 12 (*)    All other components within normal limits  POCT I-STAT TROPONIN I  BASIC METABOLIC PANEL   Dg Chest 2 View  02/18/2012  *RADIOLOGY REPORT*  Clinical Data: Chest pain.  CHEST - 2 VIEW  Comparison: Chest 10/12/2011.  Findings: Lungs are clear.  Heart size is normal.  No pneumothorax or effusion.  IMPRESSION: Negative chest.  Original Report Authenticated By: Bernadene Bell. Maricela Curet, M.D.     No diagnosis found.    MDM  Sign out given to [facility]. dispo pending [facility] evaluation in ER.         Jenness Corner, [place] 02/18/12 1535

## 2012-02-18 NOTE — ED Notes (Signed)
Elane Fritz Armendariz wife can be reached at [phone]

## 2012-02-19 ENCOUNTER — Encounter (HOSPITAL_COMMUNITY)
Admission: EM | Disposition: A | Discharge status: Home / Self Care | Payer: Self-pay | Source: Ambulatory Visit | Attending: Cardiology

## 2012-02-19 DIAGNOSIS — I471 Supraventricular tachycardia: Secondary | ICD-10-CM

## 2012-02-19 DIAGNOSIS — I2510 Atherosclerotic heart disease of native coronary artery without angina pectoris: Secondary | ICD-10-CM

## 2012-02-19 HISTORY — PX: LEFT HEART CATHETERIZATION WITH CORONARY ANGIOGRAM: SHX5451

## 2012-02-19 LAB — CBC
HCT: 38.1 % — ABNORMAL LOW (ref 39.0–52.0)
Hemoglobin: 13 g/dL (ref 13.0–17.0)
MCH: 29.7 pg (ref 26.0–34.0)
MCHC: 34.1 g/dL (ref 30.0–36.0)
MCV: 87.2 fL (ref 78.0–100.0)
Platelets: 216 10*3/uL (ref 150–400)
RBC: 4.37 MIL/uL (ref 4.22–5.81)
RDW: 14.3 % (ref 11.5–15.5)
WBC: 5.9 10*3/uL (ref 4.0–10.5)

## 2012-02-19 LAB — BASIC METABOLIC PANEL
BUN: 20 mg/dL (ref 6–23)
CO2: 24 mEq/L (ref 19–32)
Calcium: 8.7 mg/dL (ref 8.4–10.5)
Chloride: 108 mEq/L (ref 96–112)
Creatinine, Ser: 1.23 mg/dL (ref 0.50–1.35)
GFR calc Af Amer: 69 mL/min — ABNORMAL LOW (ref >90)
GFR calc non Af Amer: 59 mL/min — ABNORMAL LOW (ref >90)
Glucose, Bld: 116 mg/dL — ABNORMAL HIGH (ref 70–99)
Potassium: 4.2 mEq/L (ref 3.5–5.1)
Sodium: 140 mEq/L (ref 135–145)

## 2012-02-19 LAB — HEPARIN LEVEL (UNFRACTIONATED)
Heparin Unfractionated: 0.36 IU/mL (ref 0.30–0.70)
Heparin Unfractionated: 0.39 IU/mL (ref 0.30–0.70)

## 2012-02-19 LAB — LIPID PANEL
Cholesterol: 206 mg/dL — ABNORMAL HIGH (ref 0–200)
HDL: 40 mg/dL (ref >39)
LDL Cholesterol: 111 mg/dL — ABNORMAL HIGH (ref 0–99)
Total CHOL/HDL Ratio: 5.2 RATIO
Triglycerides: 275 mg/dL — ABNORMAL HIGH (ref <150)
VLDL: 55 mg/dL — ABNORMAL HIGH (ref 0–40)

## 2012-02-19 LAB — PROTIME-INR
INR: 0.97 (ref 0.00–1.49)
Prothrombin Time: 13.1 seconds (ref 11.6–15.2)

## 2012-02-19 LAB — CARDIAC PANEL(CRET KIN+CKTOT+MB+TROPI)
CK, MB: 1.9 ng/mL (ref 0.3–4.0)
CK, MB: 1.9 ng/mL (ref 0.3–4.0)
Relative Index: INVALID (ref 0.0–2.5)
Relative Index: INVALID (ref 0.0–2.5)
Total CK: 87 U/L (ref 7–232)
Total CK: 90 U/L (ref 7–232)
Troponin I: <0.3 ng/mL (ref <0.30)
Troponin I: <0.3 ng/mL (ref <0.30)

## 2012-02-19 LAB — TSH: TSH: 2.213 u[IU]/mL (ref 0.350–4.500)

## 2012-02-19 SURGERY — LEFT HEART CATHETERIZATION WITH CORONARY ANGIOGRAM
Anesthesia: LOCAL

## 2012-02-19 MED ORDER — ONDANSETRON HCL 4 MG/2ML IJ SOLN: 4.0000 mg | Freq: Four times a day (QID) | INTRAMUSCULAR | Status: DC | PRN

## 2012-02-19 MED ORDER — SODIUM CHLORIDE 0.9 % IV SOLN: INTRAVENOUS | Status: DC

## 2012-02-19 MED ORDER — HEPARIN (PORCINE) IN NACL 2-0.9 UNIT/ML-% IJ SOLN
INTRAMUSCULAR | Status: AC
  Filled 2012-02-19: qty 2000

## 2012-02-19 MED ORDER — FENTANYL CITRATE 0.05 MG/ML IJ SOLN
INTRAMUSCULAR | Status: AC
  Filled 2012-02-19: qty 2

## 2012-02-19 MED ORDER — NITROGLYCERIN 0.2 MG/ML ON CALL CATH LAB
INTRAVENOUS | Status: AC
  Filled 2012-02-19: qty 1

## 2012-02-19 MED ORDER — ACETAMINOPHEN 325 MG PO TABS: 650.0000 mg | ORAL_TABLET | ORAL | Status: DC | PRN

## 2012-02-19 MED ORDER — LIDOCAINE HCL (PF) 1 % IJ SOLN
INTRAMUSCULAR | Status: AC
  Filled 2012-02-19: qty 30

## 2012-02-19 MED ORDER — MIDAZOLAM HCL 2 MG/2ML IJ SOLN
INTRAMUSCULAR | Status: AC
  Filled 2012-02-19: qty 2

## 2012-02-19 MED ORDER — ISOSORBIDE MONONITRATE ER 30 MG PO TB24: 30.0000 mg | ORAL_TABLET | Freq: Every day | ORAL | Status: DC

## 2012-02-19 MED ORDER — METOPROLOL SUCCINATE ER 25 MG PO TB24: 25.0000 mg | ORAL_TABLET | Freq: Every day | ORAL | Status: DC

## 2012-02-19 NOTE — Progress Notes (Signed)
Pt is a Air traffic controller.   He requested spiritual ritual or support.  I checked with pt to see if a [employer] representative had offered communion this morning.  No one has seen him yet.  I will check again later today and follow up with pt according to his request. Please page me if needed or requested. Gwen Sarvis  [phone] oncall pager   [phone] personal pager

## 2012-02-19 NOTE — Discharge Instructions (Signed)
NO HEAVY LIFTING OR SEXUAL ACTIVITY X 7 DAYS. NO DRIVING X 2 DAYS. NO SOAKING BATHS, HOT TUBS, POOLS, ETC., X 5 DAYS.  Radial Site Care Refer to this sheet in the next few weeks. These instructions provide you with information on caring for yourself after your procedure. Your caregiver may also give you more specific instructions. Your treatment has been planned according to current medical practices, but problems sometimes occur. Call your caregiver if you have any problems or questions after your procedure. HOME CARE INSTRUCTIONS  You may shower the day after the procedure.Remove the bandage (dressing) and gently wash the site with plain soap and water.Gently pat the site dry.   Do not apply powder or lotion to the site.   Do not submerge the affected site in water for 3 to 5 days.   Inspect the site at least twice daily.   Do not flex or bend the affected arm for 24 hours.   No lifting over 5 pounds (2.3 kg) for 5 days after your procedure.   Do not drive home if you are discharged the same day of the procedure. Have someone else drive you.   You may drive 24 hours after the procedure unless otherwise instructed by your caregiver.   Do not operate machinery or power tools for 24 hours.   A responsible adult should be with you for the first 24 hours after you arrive home.  What to expect:  Any bruising will usually fade within 1 to 2 weeks.   Blood that collects in the tissue (hematoma) may be painful to the touch. It should usually decrease in size and tenderness within 1 to 2 weeks.  SEEK IMMEDIATE MEDICAL CARE IF:  You have unusual pain at the radial site.   You have redness, warmth, swelling, or pain at the radial site.   You have drainage (other than a small amount of blood on the dressing).   You have chills.   You have a fever or persistent symptoms for more than 72 hours.   You have a fever and your symptoms suddenly get worse.   Your arm becomes pale, cool,  tingly, or numb.   You have heavy bleeding from the site. Hold pressure on the site.  Document Released: 11/21/2010 Document Revised: 10/08/2011 Document Reviewed: 11/21/2010 [facility] Patient Information 2012 [place], [place].

## 2012-02-19 NOTE — Progress Notes (Signed)
ANTICOAGULATION CONSULT NOTE - Initial Consult  Pharmacy Consult for heparin Indication: unstable angina  Allergies  Allergen Reactions  . Eggs Or Egg-Derived Products Itching    Patient Measurements:   Heparin Dosing Weight: 72.1kg  Vital Signs: Temp: 97.8 F (36.6 C) (04/18 2100) Temp src: Oral (04/18 2100) BP: 105/66 mmHg (04/18 2223) Pulse Rate: 61  (04/18 2100)  Labs:  Basename 02/19/12 0200 02/18/12 1956 02/18/12 1438 02/18/12 1412 02/18/12 0010  HGB 13.0 -- 13.1 -- --  HCT 38.1* -- 38.2* -- --  PLT 216 -- 229 -- --  APTT -- -- -- -- --  LABPROT -- -- -- -- --  INR -- -- -- -- --  HEPARINUNFRC -- -- -- -- 0.39  CREATININE -- -- -- 1.09 --  CKTOTAL -- 100 -- -- --  CKMB -- 2.1 -- -- --  TROPONINI -- <0.30 -- -- --   Heparin level 0.39  The CrCl is unknown because both a height and weight (above a minimum accepted value) are required for this calculation.  Assessment: 66 yo male with unstable angina for Heparin  Goal of Therapy:  Heparin level 0.3-0.7 units/ml   Plan:  Continue Heparin at current rate  Follow-up am labs.  Eddie Candle 02/19/2012,2:36 AM

## 2012-02-19 NOTE — Discharge Summary (Signed)
CARDIOLOGY DISCHARGE SUMMARY   Patient ID: KONNAR BEN MRN: [id number] DOB/AGE: [birth year] 66 y.o.  Admit date: 02/18/2012 Discharge date: 02/19/2012  Primary Discharge Diagnosis:  Chest pain, medical therapy for small-vessel disease Secondary Discharge Diagnosis:  Past Medical History  Diagnosis Date  . Essential hypertension, benign   . Other and unspecified hyperlipidemia   . Coronary atherosclerosis of unspecified type of vessel, native or graft   . GERD (gastroesophageal reflux disease)   . Lower back pain   . Osteoarthritis   . Angina   . Myocardial infarction 1997  . History of bronchitis   . Pneumonia 1957  . Sleep apnea   . PAD (peripheral artery disease)     with ABI's 0.8 on the right and 0.86 on the left  . DVT of lower extremity (deep venous thrombosis) ~ 2010    LLE  . Shortness of breath     "lying down"  . Headache 02/18/12    "lately"  . PSVT (paroxysmal supraventricular tachycardia) 02/18/12   Procedures: Left Heart Cath, Selective Coronary Angiography, LV angiography   Hospital Course: Mr. Riano is a 66 year old male with a history of coronary artery disease. He had chest pain and came to the hospital where he was admitted for further evaluation and treatment.  His cardiac enzymes were negative for MI. His symptoms were concerning for angina so he was taken to the cath lab on 02/19/2012. The results are listed below. Dr. Lucien Mons reviewed the data and felt that the study was similar in appearance to 2011 but there was complex in-stent restenosis in the OM 2. PCI in this area had previously been unsuccessful. He was felt to have small vessel disease and medical therapy was recommended. He had Imdur added to his medication regimen.  Post cath Mr. Binsfeld is without chest pain or shortness of breath. He is considered stable for discharge on 02/19/2012, to follow up as an outpatient.  Labs:   Lab Results  Component Value Date   WBC 5.9 02/19/2012   HGB  13.0 02/19/2012   HCT 38.1* 02/19/2012   MCV 87.2 02/19/2012   PLT 216 02/19/2012    Lab 02/19/12 0200  NA 140  K 4.2  CL 108  CO2 24  BUN 20  CREATININE 1.23  CALCIUM 8.7  PROT --  BILITOT --  ALKPHOS --  ALT --  AST --  GLUCOSE 116*    Basename 02/19/12 0805 02/19/12 0200 02/18/12 1956  CKTOTAL 90 87 100  CKMB 1.9 1.9 2.1  CKMBINDEX -- -- --  TROPONINI <0.30 <0.30 <0.30   Lipid Panel     Component Value Date/Time   CHOL 206* 02/19/2012 0200   TRIG 275* 02/19/2012 0200   HDL 40 02/19/2012 0200   CHOLHDL 5.2 02/19/2012 0200   VLDL 55* 02/19/2012 0200   LDLCALC 111* 02/19/2012 0200    No results found for this basename: probnp    Basename 02/19/12 0650  INR 0.97      Radiology:  Dg Chest 2 View 02/18/2012  *RADIOLOGY REPORT*  Clinical Data: Chest pain.  CHEST - 2 VIEW  Comparison: Chest 10/12/2011.  Findings: Lungs are clear.  Heart size is normal.  No pneumothorax or effusion.  IMPRESSION: Negative chest.  Original Report Authenticated By: Bernadene Bell. Maricela Curet, M.D.   Cardiac Cath: 02/19/2012 Left mainstem: Minimal disease.  Left anterior descending (LAD): 30-40% proximal vessel stenosis.  Left circumflex (LCx): Dominant vessel. Small to moderate branching ramus with 50% ostial stenosis. Tiny  OM1 with diffuse disease. Branching OM2. The superior branch has a stent with complex 80-90% in-stent restenosis.  Right coronary artery (RCA): Small, nondominant RCA. There is a stent in the proximal RCA with about 70% in-stent restenosis. This has progressed a small amount compared to the prior cath.  Left ventriculography: Left ventricular systolic function is normal, LVEF is estimated at 55-60%, no wall motion abnormalities in RAO projection.  Final Conclusions: This study is similar in appearance to the prior in 2011. The stent in OM2 has complex in-stent restenosis. PCI was attempted and was unsuccessful in 2011. The RCA is small and nondominant with 70% in-stent restenosis in the  proximal RCA stent. This has mildly progressed compared to 2011. I do not think that this small vessel would be a good target for PCI.  Recommendations: Mr Morelos has substrate for angina but I do not think there is a good target for intervention. Cath appears similar to 2011 study. Will treat medically - add Imdur 30 mg daily.  EKG: 19-Feb-2012 05:29:34 Normal sinus rhythm Normal ECG Vent. rate 62 BPM PR interval 164 ms QRS duration 92 ms QT/QTc 428/434 ms P-R-T axes 55 24 28  FOLLOW UP PLANS AND APPOINTMENTS Discharge Orders    Future Orders Please Complete By Expires   Diet - low sodium heart healthy      Increase activity slowly        Follow-up Information    Follow up with Olga Millers, MD. (May 6th at 3pm)    Contact information:   [address] [place], [address] [place] [address] [phone]          Allergies  Allergen Reactions  . Eggs Or Egg-Derived Products Itching   Medication List  As of 02/19/2012  3:35 PM   TAKE these medications         aspirin 81 MG tablet   Take 81 mg by mouth daily.      isosorbide mononitrate 30 MG 24 hr tablet   Commonly known as: IMDUR   Take 1 tablet (30 mg total) by mouth daily.      metoprolol succinate 25 MG 24 hr tablet   Commonly known as: TOPROL-XL   Take 1 tablet (25 mg total) by mouth daily. Take with or immediately following a meal.      multivitamin capsule   Take 1 capsule by mouth daily.      nitroGLYCERIN 0.4 MG SL tablet   Commonly known as: NITROSTAT   Place 0.4 mg under the tongue every 5 (five) minutes as needed. For chest pain      rosuvastatin 40 MG tablet   Commonly known as: CRESTOR   Take 40 mg by mouth at bedtime.           BRING ALL MEDICATIONS WITH YOU TO FOLLOW UP APPOINTMENTS  Time spent with patient to include physician time: 31 min Signed: Theodore Demark 02/19/2012, 3:35 PM Co-Sign MD ]

## 2012-02-19 NOTE — H&P (View-Only) (Signed)
@   Subjective:  Denies CP or dyspnea   Objective:  Filed Vitals:   02/18/12 1847 02/18/12 2100 02/18/12 2223 02/19/12 0400  BP: 111/67 96/54 105/66 111/73  Pulse: 60 61  62  Temp: 97.5 F (36.4 C) 97.8 F (36.6 C)  97.5 F (36.4 C)  TempSrc: Oral Oral  Oral  Resp: 16 16  18  Height:    5' 5" (1.651 m)  Weight:    163 lb 5.8 oz (74.1 kg)  SpO2: 95% 96%  96%    Intake/Output from previous day: No intake or output data in the 24 hours ending 02/19/12 0731  Physical Exam: Physical exam: Well-developed well-nourished in no acute distress.  Skin is warm and dry.  HEENT is normal.  Neck is supple.  Chest is clear to auscultation with normal expansion.  Cardiovascular exam is regular rate and rhythm.  Abdominal exam nontender or distended. No masses palpated. Extremities show no edema. neuro grossly intact    Lab Results: Basic Metabolic Panel:  Basename 02/19/12 0200 02/18/12 1412  NA 140 142  K 4.2 4.3  CL 108 108  CO2 24 24  GLUCOSE 116* 84  BUN 20 23  CREATININE 1.23 1.09  CALCIUM 8.7 9.0  MG -- --  PHOS -- --   CBC:  Basename 02/19/12 0200 02/18/12 1438  WBC 5.9 6.1  NEUTROABS -- 3.1  HGB 13.0 13.1  HCT 38.1* 38.2*  MCV 87.2 87.2  PLT 216 229   Cardiac Enzymes:  Basename 02/19/12 0200 02/18/12 1956  CKTOTAL 87 100  CKMB 1.9 2.1  CKMBINDEX -- --  TROPONINI <0.30 <0.30     Assessment/Plan:  1) Chest pain- enzymes negative; for cath today (risks and benefits discussed and patient agrees to proceed). Note patient has had problems with chronic recurring chest pain. 2) SVT- continue low dose beta blocker 3) Hyperlipidemia - Continue statin.  Angell Honse 02/19/2012, 7:31 AM    

## 2012-02-19 NOTE — Progress Notes (Signed)
@   Subjective:  Denies CP or dyspnea   Objective:  Filed Vitals:   02/18/12 1847 02/18/12 2100 02/18/12 2223 02/19/12 0400  BP: 111/67 96/54 105/66 111/73  Pulse: 60 61  62  Temp: 97.5 F (36.4 C) 97.8 F (36.6 C)  97.5 F (36.4 C)  TempSrc: Oral Oral  Oral  Resp: 16 16  18   Height:    5\' 5"  (1.651 m)  Weight:    163 lb 5.8 oz (74.1 kg)  SpO2: 95% 96%  96%    Intake/Output from previous day: No intake or output data in the 24 hours ending 02/19/12 0731  Physical Exam: Physical exam: Well-developed well-nourished in no acute distress.  Skin is warm and dry.  HEENT is normal.  Neck is supple.  Chest is clear to auscultation with normal expansion.  Cardiovascular exam is regular rate and rhythm.  Abdominal exam nontender or distended. No masses palpated. Extremities show no edema. neuro grossly intact    Lab Results: Basic Metabolic Panel:  Basename 02/19/12 0200 02/18/12 1412  NA 140 142  K 4.2 4.3  CL 108 108  CO2 24 24  GLUCOSE 116* 84  BUN 20 23  CREATININE 1.23 1.09  CALCIUM 8.7 9.0  MG -- --  PHOS -- --   CBC:  Basename 02/19/12 0200 02/18/12 1438  WBC 5.9 6.1  NEUTROABS -- 3.1  HGB 13.0 13.1  HCT 38.1* 38.2*  MCV 87.2 87.2  PLT 216 229   Cardiac Enzymes:  Basename 02/19/12 0200 02/18/12 1956  CKTOTAL 87 100  CKMB 1.9 2.1  CKMBINDEX -- --  TROPONINI <0.30 <0.30     Assessment/Plan:  1) Chest pain- enzymes negative; for cath today (risks and benefits discussed and patient agrees to proceed). Note patient has had problems with chronic recurring chest pain. 2) SVT- continue low dose beta blocker 3) Hyperlipidemia - Continue statin.  Olga Millers 02/19/2012, 7:31 AM

## 2012-02-19 NOTE — Interval H&P Note (Signed)
**Note Harold-Identified via Obfuscation** History and Physical Interval Note:  02/19/2012 2:13 PM  Harold Waters  has presented today for surgery, with the diagnosis of Chest pain  The various methods of treatment have been discussed with the patient and family. After consideration of risks, benefits and other options for treatment, the patient has consented to  Procedure(s) (LRB): LEFT HEART CATHETERIZATION WITH CORONARY ANGIOGRAM (N/A) as a surgical intervention .  The patients' history has been reviewed, patient examined, no change in status, stable for surgery.  I have reviewed the patients' chart and labs.  Questions were answered to the patient's satisfaction.     Victorhugo Preis [employer]

## 2012-02-19 NOTE — Progress Notes (Signed)
Reverse Allen's Test is positive.  TR taken off and new dressing applied.

## 2012-02-19 NOTE — Progress Notes (Signed)
   CARE MANAGEMENT NOTE 02/19/2012  Patient:  Harold Waters, Harold Waters   Account Number:  [id number]  Date Initiated:  02/19/2012  Documentation initiated by:  GRAVES-BIGELOW,Errick Salts  Subjective/Objective Assessment:   Pt in with cp. Plan for cath today. Pt is form home with family.     Action/Plan:   CM will continue to monitor for disposition/medication needs.   Anticipated DC Date:  02/22/2012   Anticipated DC Plan:  HOME/SELF CARE      DC Planning Services  CM consult      Choice offered to / List presented to:             Status of service:  In process, will continue to follow Medicare Important Message given?   (If response is "NO", the following Medicare IM given date fields will be blank) Date Medicare IM given:   Date Additional Medicare IM given:    Discharge Disposition:    Per UR Regulation:    If discussed at Long Length of Stay Meetings, dates discussed:    Comments:  02-19-12 1415 Tomi Bamberger, RN,BSN [phone] CM will continue to monitor.

## 2012-02-19 NOTE — Procedures (Signed)
   Cardiac Catheterization Procedure Note  Name: ELIAZAR OLIVAR MRN: [id number] DOB: 01/09/1946  Procedure: Left Heart Cath, Selective Coronary Angiography, LV angiography  Indication: Unstable angina   Procedural Details: The right wrist was prepped, draped, and anesthetized with 1% lidocaine. Using the modified Seldinger technique, a 5 French sheath was introduced into the right radial artery. 3 mg of verapamil was administered through the sheath, weight-based unfractionated heparin was administered intravenously. Standard Judkins catheters were used for selective coronary angiography and left ventriculography. Catheter exchanges were performed over an exchange length guidewire. There were no immediate procedural complications. A TR band was used for radial hemostasis at the completion of the procedure.  The patient was transferred to the post catheterization recovery area for further monitoring.  Procedural Findings: Hemodynamics: AO 109/52 LV 101/11  Coronary angiography: Coronary dominance: left  Left mainstem: Minimal disease.   Left anterior descending (LAD): 30-40% proximal vessel stenosis.   Left circumflex (LCx): Dominant vessel.  Small to moderate branching ramus with 50% ostial stenosis.  Tiny OM1 with diffuse disease.  Branching OM2.  The superior branch has a stent with complex 80-90% in-stent restenosis.    Right coronary artery (RCA): Small, nondominant RCA. There is a stent in the proximal RCA with about 70% in-stent restenosis.  This has progressed a small amount compared to the prior cath.   Left ventriculography: Left ventricular systolic function is normal, LVEF is estimated at 55-60%, no wall motion abnormalities in RAO projection.   Final Conclusions:  This study is similar in appearance to the prior in 2011.  The stent in OM2 has complex in-stent restenosis.  PCI was attempted and was unsuccessful in 2011.  The RCA is small and nondominant with 70% in-stent  restenosis in the proximal RCA stent.  This has mildly progressed compared to 2011.  I do not think that this small vessel would be a good target for PCI.    Recommendations: Mr Puls has substrate for angina but I do not think there is a good target for intervention.  Cath appears similar to 2011 study.  Will treat medically - add Imdur 30 mg daily.  He can go home tonight.   Marca Ancona 02/19/2012, 3:17 PM

## 2012-02-23 MED FILL — Nicardipine HCl IV Soln 2.5 MG/ML: INTRAVENOUS | Qty: 1 | Status: AC

## 2012-03-07 ENCOUNTER — Encounter: Payer: Self-pay | Admitting: Cardiology

## 2012-03-07 ENCOUNTER — Ambulatory Visit (INDEPENDENT_AMBULATORY_CARE_PROVIDER_SITE_OTHER): Payer: Medicare HMO | Admitting: Cardiology

## 2012-03-07 VITALS — BP 112/66 | HR 60 | Ht 65.0 in | Wt 167.0 lb

## 2012-03-07 DIAGNOSIS — I70219 Atherosclerosis of native arteries of extremities with intermittent claudication, unspecified extremity: Secondary | ICD-10-CM

## 2012-03-07 DIAGNOSIS — I10 Essential (primary) hypertension: Secondary | ICD-10-CM

## 2012-03-07 DIAGNOSIS — I471 Supraventricular tachycardia, unspecified: Secondary | ICD-10-CM

## 2012-03-07 DIAGNOSIS — I2510 Atherosclerotic heart disease of native coronary artery without angina pectoris: Secondary | ICD-10-CM

## 2012-03-07 DIAGNOSIS — I6529 Occlusion and stenosis of unspecified carotid artery: Secondary | ICD-10-CM

## 2012-03-07 DIAGNOSIS — E785 Hyperlipidemia, unspecified: Secondary | ICD-10-CM

## 2012-03-07 NOTE — Patient Instructions (Signed)
Your physician wants you to follow-up in: 6 MONTHS WITH DR CRENSHAW You will receive a reminder letter in the mail two months in advance. If you don't receive a letter, please call our office to schedule the follow-up appointment.  

## 2012-03-07 NOTE — Assessment & Plan Note (Signed)
Continue aspirin and statin. Plan medical therapy as outlined. Continue beta blocker and nitrates.

## 2012-03-07 NOTE — Assessment & Plan Note (Signed)
Brief SVT in the hospital. Continue beta blocker.

## 2012-03-07 NOTE — Progress Notes (Signed)
HPI: Pleasant male that I have seen in the past for coronary disease. He has a history of a prior stent to a small nondominant right coronary artery in [place] in 1997 per old notes. He had stent to left SFA in Nov 2011. Last carotid Dopplers in August of 2012 showed 0-39% stenosis bilaterally. Followup recommended in 2 years. ABIs in December of 2012 showed a patent stent and normal ABIs bilaterally. Patient admitted in April of 2013 with chest pain and ruled out. Repeat cardiac catheterization in April of 2013 showed minimal disease in the left main. There was a 30-40% proximal LAD. A ramus was noted to have a 50% ostial lesion. A superior branch of the OM 2 had a stent with a complex 80-90% in-stent restenosis. A small nondominant right had a 70% in-stent restenosis. Ejection fraction was 55-60%. Previous PCI of the OM2 had been unsuccessful. It was felt medical therapy best option. Patient also with brief SVT in the emergency room treated with beta-blockade. Since DC, he occasionally has chest discomfort and dyspnea with more vigorous activities relieved with nitroglycerin. No symptoms at rest. No orthopnea, PND, pedal edema or syncope. No claudication.   Current Outpatient Prescriptions  Medication Sig Dispense Refill  . aspirin 81 MG tablet Take 81 mg by mouth daily.        . Multiple Vitamin (MULTIVITAMIN) capsule Take 1 capsule by mouth daily.        . nitroGLYCERIN (NITROSTAT) 0.4 MG SL tablet Place 0.4 mg under the tongue every 5 (five) minutes as needed. For chest pain      . rosuvastatin (CRESTOR) 40 MG tablet Take 40 mg by mouth at bedtime.      . isosorbide mononitrate (IMDUR) 30 MG 24 hr tablet Take 1 tablet (30 mg total) by mouth daily.  30 tablet  11  . metoprolol succinate (TOPROL XL) 25 MG 24 hr tablet Take 1 tablet (25 mg total) by mouth daily. Take with or immediately following a meal.  30 tablet  11     Past Medical History  Diagnosis Date  . Essential hypertension, benign    . Other and unspecified hyperlipidemia   . Coronary atherosclerosis of unspecified type of vessel, native or graft   . GERD (gastroesophageal reflux disease)   . Lower back pain   . Osteoarthritis   . Angina   . Myocardial infarction 1997  . History of bronchitis   . Pneumonia 1957  . Sleep apnea   . PAD (peripheral artery disease)     with ABI's 0.8 on the right and 0.86 on the left  . DVT of lower extremity (deep venous thrombosis) ~ 2010    LLE  . Shortness of breath     "lying down"  . Headache 02/18/12    "lately"  . PSVT (paroxysmal supraventricular tachycardia) 02/18/12    Past Surgical History  Procedure Date  . Arthroscopy knee w/ drilling ~ 2001    right  . Appendectomy     hx of  . Coronary angioplasty with stent placement 1997    "2"  . Peripheral arterial stent graft 09/2010    LLE    History   Social History  . Marital Status: Married    Spouse Name: N/A    Number of Children: N/A  . Years of Education: N/A   Occupational History  . Not on file.   Social History Main Topics  . Smoking status: Former Smoker -- 1.0 packs/day for 40  years    Types: Cigarettes    Quit date: 04/03/2010  . Smokeless tobacco: Never Used  . Alcohol Use: Yes     "used to drink when I was young; quit ~ 1980's"  . Drug Use: No  . Sexually Active: Yes   Other Topics Concern  . Not on file   Social History Narrative  . No narrative on file    ROS: no fevers or chills, productive cough, hemoptysis, dysphasia, odynophagia, melena, hematochezia, dysuria, hematuria, rash, seizure activity, orthopnea, PND, pedal edema, claudication. Remaining systems are negative.  Physical Exam: Well-developed well-nourished in no acute distress.  Skin is warm and dry.  HEENT is normal.  Neck is supple.  Chest is clear to auscultation with normal expansion.  Cardiovascular exam is regular rate and rhythm.  Abdominal exam nontender or distended. No masses palpated. Extremities show  no edema. neuro grossly intact

## 2012-03-07 NOTE — Assessment & Plan Note (Signed)
Blood pressure controlled. Continue present medications. 

## 2012-03-07 NOTE — Assessment & Plan Note (Signed)
Continue aspirin and statin. Followup carotid Dopplers August 2014. 

## 2012-03-07 NOTE — Assessment & Plan Note (Signed)
Continue aspirin and statin. Most recent ABIs normal.

## 2012-03-07 NOTE — Assessment & Plan Note (Signed)
Continue statin. 

## 2012-07-20 ENCOUNTER — Other Ambulatory Visit: Payer: Self-pay | Admitting: Cardiology

## 2012-08-22 ENCOUNTER — Ambulatory Visit (INDEPENDENT_AMBULATORY_CARE_PROVIDER_SITE_OTHER): Payer: Medicare HMO | Admitting: Internal Medicine

## 2012-08-22 VITALS — BP 110/70 | HR 70 | Temp 98.2°F | Resp 16

## 2012-08-22 DIAGNOSIS — K219 Gastro-esophageal reflux disease without esophagitis: Secondary | ICD-10-CM

## 2012-08-22 DIAGNOSIS — I10 Essential (primary) hypertension: Secondary | ICD-10-CM

## 2012-08-22 DIAGNOSIS — D485 Neoplasm of uncertain behavior of skin: Secondary | ICD-10-CM

## 2012-08-22 DIAGNOSIS — E785 Hyperlipidemia, unspecified: Secondary | ICD-10-CM

## 2012-08-22 DIAGNOSIS — R109 Unspecified abdominal pain: Secondary | ICD-10-CM

## 2012-08-22 DIAGNOSIS — I219 Acute myocardial infarction, unspecified: Secondary | ICD-10-CM

## 2012-08-22 MED ORDER — OMEPRAZOLE 20 MG PO CPDR: 20.0000 mg | DELAYED_RELEASE_CAPSULE | Freq: Every day | ORAL | Status: DC

## 2012-08-22 NOTE — Assessment & Plan Note (Signed)
Skin bx 

## 2012-08-22 NOTE — Assessment & Plan Note (Signed)
Continue with current prescription therapy as reflected on the Med list.  

## 2012-08-22 NOTE — Progress Notes (Signed)
  Subjective:    Patient ID: Harold Waters, male    DOB: [birth year], 66 y.o.   MRN: [id number]  HPI  C/oabd bloating with meals, pain - worse after spicy foods; epig pain /o skin lesion on L cheek F/u CAD  BP Readings from Last 3 Encounters:  03/07/12 112/66  02/19/12 96/57  02/19/12 96/57   Wt Readings from Last 3 Encounters:  03/07/12 167 lb (75.751 kg)  02/19/12 163 lb 5.8 oz (74.1 kg)  02/19/12 163 lb 5.8 oz (74.1 kg)       Review of Systems  Constitutional: Negative for appetite change, fatigue and unexpected weight change.  HENT: Negative for nosebleeds, congestion, sore throat, sneezing, trouble swallowing and neck pain.   Eyes: Negative for itching and visual disturbance.  Respiratory: Negative for cough.   Cardiovascular: Negative for chest pain, palpitations and leg swelling.  Gastrointestinal: Positive for abdominal pain. Negative for nausea, diarrhea, blood in stool and abdominal distention.  Genitourinary: Negative for frequency and hematuria.  Musculoskeletal: Negative for back pain, joint swelling and gait problem.  Skin: Negative for rash.  Neurological: Negative for dizziness, tremors, speech difficulty and weakness.  Psychiatric/Behavioral: Negative for disturbed wake/sleep cycle, dysphoric mood and agitation. The patient is not nervous/anxious.        Objective:   Physical Exam  Constitutional: He is oriented to person, place, and time. He appears well-developed.  HENT:  Mouth/Throat: Oropharynx is clear and moist.  Eyes: Conjunctivae normal are normal. Pupils are equal, round, and reactive to light.  Neck: Normal range of motion. No JVD present. No thyromegaly present.  Cardiovascular: Normal rate, regular rhythm, normal heart sounds and intact distal pulses.  Exam reveals no gallop and no friction rub.   No murmur heard. Pulmonary/Chest: Effort normal and breath sounds normal. No respiratory distress. He has no wheezes. He has no rales. He exhibits  no tenderness.  Abdominal: Soft. Bowel sounds are normal. He exhibits no distension and no mass. There is tenderness (epig area). There is no rebound and no guarding.  Musculoskeletal: Normal range of motion. He exhibits no edema and no tenderness.  Lymphadenopathy:    He has no cervical adenopathy.  Neurological: He is alert and oriented to person, place, and time. He has normal reflexes. No cranial nerve deficit. He exhibits normal muscle tone. Coordination normal.  Skin: Skin is warm and dry. No rash noted.       Mole under L eye  Psychiatric: He has a normal mood and affect. His behavior is normal. Judgment and thought content normal.          Assessment & Plan:

## 2012-08-22 NOTE — Assessment & Plan Note (Signed)
Doing well 

## 2012-08-22 NOTE — Assessment & Plan Note (Signed)
Start Omeprazole Labs

## 2012-08-23 ENCOUNTER — Encounter: Payer: Self-pay | Admitting: Internal Medicine

## 2012-08-23 NOTE — Assessment & Plan Note (Signed)
10/13 epig bloating -- r/o PUD Labs See meds

## 2012-09-06 ENCOUNTER — Encounter: Payer: Self-pay | Admitting: Internal Medicine

## 2012-09-06 ENCOUNTER — Ambulatory Visit (INDEPENDENT_AMBULATORY_CARE_PROVIDER_SITE_OTHER): Payer: Medicare HMO | Admitting: Internal Medicine

## 2012-09-06 VITALS — BP 118/74 | HR 72 | Temp 97.3°F | Resp 16 | Wt 168.0 lb

## 2012-09-06 DIAGNOSIS — D489 Neoplasm of uncertain behavior, unspecified: Secondary | ICD-10-CM

## 2012-09-06 DIAGNOSIS — D485 Neoplasm of uncertain behavior of skin: Secondary | ICD-10-CM

## 2012-09-06 DIAGNOSIS — L570 Actinic keratosis: Secondary | ICD-10-CM

## 2012-09-06 HISTORY — DX: Actinic keratosis: L57.0

## 2012-09-06 NOTE — Assessment & Plan Note (Signed)
Shave bx 

## 2012-09-06 NOTE — Assessment & Plan Note (Signed)
Cryo x1 

## 2012-09-06 NOTE — Progress Notes (Signed)
Patient ID: Harold Waters, male   DOB: 1945-11-23, 66 y.o.   MRN: [id number]   Procedure Note :     Procedure :  Skin biopsy   Indication:  Changing mole (s ),  Suspicious lesion(s)   Risks including unsuccessful procedure , bleeding, infection, bruising, scar, a need for another complete procedure and others were explained to the patient in detail as well as the benefits. Informed consent was obtained and signed.   The patient was placed in a decubitus position.  Lesion #1 on  L cheek   measuring 6x4    mm   Skin over lesion #1  was prepped with Betadine and alcohol  and anesthetized with 1 cc of 2% lidocaine and epinephrine, using a 25-gauge 1 inch needle.  Shave biopsy with a sterile Dermablade was carried out in the usual fashion. Hyfrecator was used to destroy the rest of the lesion potentially left behind and for hemostasis. Band-Aid was applied with antibiotic ointment.     Tolerated well. Complications none.           Procedure Note :     Procedure : Cryosurgery   Indication: Actinic keratosis(es)   Risks including unsuccessful procedure , bleeding, infection, bruising, scar, a need for a repeat  procedure and others were explained to the patient in detail as well as the benefits. Informed consent was obtained verbally.    1 lesion(s)  on R forehead   was/were treated with liquid nitrogen on a Q-tip in a usual fasion . Band-Aid was applied and antibiotic ointment was given for a later use.   Tolerated well. Complications none.

## 2012-09-08 ENCOUNTER — Telehealth: Payer: Self-pay | Admitting: Internal Medicine

## 2012-09-08 NOTE — Telephone Encounter (Signed)
Harold Waters, please, inform patient that his lesion on the face was a wart Thx

## 2012-09-08 NOTE — Telephone Encounter (Signed)
Left mess for patient to call back.  

## 2012-10-03 ENCOUNTER — Encounter: Payer: Self-pay | Admitting: Internal Medicine

## 2012-10-03 ENCOUNTER — Ambulatory Visit (INDEPENDENT_AMBULATORY_CARE_PROVIDER_SITE_OTHER): Payer: Medicare HMO | Admitting: Internal Medicine

## 2012-10-03 VITALS — BP 130/84 | HR 76 | Temp 97.7°F | Resp 16 | Wt 170.0 lb

## 2012-10-03 DIAGNOSIS — I2510 Atherosclerotic heart disease of native coronary artery without angina pectoris: Secondary | ICD-10-CM

## 2012-10-03 DIAGNOSIS — I10 Essential (primary) hypertension: Secondary | ICD-10-CM

## 2012-10-03 DIAGNOSIS — R109 Unspecified abdominal pain: Secondary | ICD-10-CM

## 2012-10-03 HISTORY — DX: Atherosclerotic heart disease of native coronary artery without angina pectoris: I25.10

## 2012-10-03 MED ORDER — ESOMEPRAZOLE MAGNESIUM 40 MG PO CPDR: 40.0000 mg | DELAYED_RELEASE_CAPSULE | Freq: Every day | ORAL | Status: DC

## 2012-10-03 NOTE — Assessment & Plan Note (Signed)
Continue with current prescription therapy as reflected on the Med list.  

## 2012-10-03 NOTE — Progress Notes (Signed)
Subjective:    Patient ID: Ardine Eng, male    DOB: 10-Jul-1946, 66 y.o.   MRN: [id number]  HPI  F/u abd bloating with meals, pain - worse after spicy foods; epig pain. He was better when he was taking Omeprazole. He did not ref his "stomach med Rx". He is feeling worse again  F/u CAD  BP Readings from Last 3 Encounters:  10/03/12 130/84  09/06/12 118/74  08/22/12 110/70   Wt Readings from Last 3 Encounters:  10/03/12 170 lb (77.111 kg)  09/06/12 168 lb (76.204 kg)  03/07/12 167 lb (75.751 kg)       Review of Systems  Constitutional: Negative for appetite change, fatigue and unexpected weight change.  HENT: Negative for nosebleeds, congestion, sore throat, sneezing, trouble swallowing and neck pain.   Eyes: Negative for itching and visual disturbance.  Respiratory: Negative for cough.   Cardiovascular: Negative for chest pain, palpitations and leg swelling.  Gastrointestinal: Positive for abdominal pain. Negative for nausea, diarrhea, blood in stool and abdominal distention.  Genitourinary: Negative for frequency and hematuria.  Musculoskeletal: Negative for back pain, joint swelling and gait problem.  Skin: Negative for rash.  Neurological: Negative for dizziness, tremors, speech difficulty and weakness.  Psychiatric/Behavioral: Negative for sleep disturbance, dysphoric mood and agitation. The patient is not nervous/anxious.        Objective:   Physical Exam  Constitutional: He is oriented to person, place, and time. He appears well-developed.  HENT:  Mouth/Throat: Oropharynx is clear and moist.  Eyes: Conjunctivae normal are normal. Pupils are equal, round, and reactive to light.  Neck: Normal range of motion. No JVD present. No thyromegaly present.  Cardiovascular: Normal rate, regular rhythm, normal heart sounds and intact distal pulses.  Exam reveals no gallop and no friction rub.   No murmur heard. Pulmonary/Chest: Breath sounds normal. No respiratory  distress. He has no wheezes. He has no rales. He exhibits no tenderness.  Abdominal: Soft. Bowel sounds are normal. He exhibits no distension and no mass. There is no tenderness. There is no rebound and no guarding.  Musculoskeletal: Normal range of motion. He exhibits no edema and no tenderness.  Lymphadenopathy:    He has no cervical adenopathy.  Neurological: He is alert and oriented to person, place, and time. He has normal reflexes. No cranial nerve deficit. He exhibits normal muscle tone. Coordination normal.  Skin: Skin is warm and dry. No rash noted.  Psychiatric: He has a normal mood and affect. His behavior is normal. Judgment and thought content normal.   Lab Results  Component Value Date   WBC 5.9 02/19/2012   HGB 13.0 02/19/2012   HCT 38.1* 02/19/2012   PLT 216 02/19/2012   GLUCOSE 116* 02/19/2012   CHOL 206* 02/19/2012   TRIG 275* 02/19/2012   HDL 40 02/19/2012   LDLDIRECT 113.2 08/19/2011   LDLCALC 111* 02/19/2012   ALT 34 08/19/2011   AST 24 08/19/2011   NA 140 02/19/2012   K 4.2 02/19/2012   CL 108 02/19/2012   CREATININE 1.23 02/19/2012   BUN 20 02/19/2012   CO2 24 02/19/2012   TSH 2.213 02/18/2012   PSA 1.85 10/18/2007   INR 0.97 02/19/2012   HGBA1C  Value: 6.4 (NOTE)  According to the ADA Clinical Practice Recommendations for 2011, when HbA1c is used as a screening test:   >=6.5%   Diagnostic of Diabetes Mellitus           (if abnormal result  is confirmed)  5.7-6.4%   Increased risk of developing Diabetes Mellitus  References:Diagnosis and Classification of Diabetes Mellitus,Diabetes Care,2011,34(Suppl 1):S62-S69 and Standards of Medical Care in         Diabetes - 2011,Diabetes Care,2011,34  (Suppl 1):S11-S61.* 04/02/2010          Assessment & Plan:

## 2012-10-03 NOTE — Assessment & Plan Note (Signed)
Start Nexium GI consult

## 2012-11-04 ENCOUNTER — Ambulatory Visit (INDEPENDENT_AMBULATORY_CARE_PROVIDER_SITE_OTHER): Payer: Medicare Other | Admitting: Cardiovascular Disease

## 2012-11-04 ENCOUNTER — Encounter: Payer: Self-pay | Admitting: Cardiovascular Disease

## 2012-11-04 VITALS — BP 112/70 | HR 69 | Ht 65.0 in | Wt 167.0 lb

## 2012-11-04 DIAGNOSIS — I2510 Atherosclerotic heart disease of native coronary artery without angina pectoris: Secondary | ICD-10-CM

## 2012-11-04 DIAGNOSIS — I70219 Atherosclerosis of native arteries of extremities with intermittent claudication, unspecified extremity: Secondary | ICD-10-CM

## 2012-11-04 NOTE — Progress Notes (Signed)
   HPI:  67 year old gentleman presenting for followup evaluation. He's undergone left SFA stenting. ABIs one year ago were normal at 1.1 on the right and 0.96 and left. Limited duplex evaluation showed a widely patent stent. The patient has coronary artery disease and he is followed by Dr. Jens Som.  The patient has done well with respect to his PAD. He's had no further calf claudication symptoms. He denies ulceration or rest pain of his feet. He does admit to chest pain with heavy exertion, but no change in his anginal threshold. He remains on antiplatelet therapy with aspirin and has had no bleeding problems.  Outpatient Encounter Prescriptions as of 11/04/2012  Medication Sig Dispense Refill  . aspirin 81 MG tablet Take 81 mg by mouth daily.        . CRESTOR 40 MG tablet TAKE ONE TABLET BY MOUTH NIGHTLY AT BEDTIME  30 tablet  10  . esomeprazole (NEXIUM) 40 MG capsule Take 1 capsule (40 mg total) by mouth daily.  30 capsule  11  . isosorbide mononitrate (IMDUR) 30 MG 24 hr tablet Take 1 tablet (30 mg total) by mouth daily.  30 tablet  11  . metoprolol succinate (TOPROL XL) 25 MG 24 hr tablet Take 1 tablet (25 mg total) by mouth daily. Take with or immediately following a meal.  30 tablet  11  . nitroGLYCERIN (NITROSTAT) 0.4 MG SL tablet Place 0.4 mg under the tongue every 5 (five) minutes as needed. For chest pain        Allergies  Allergen Reactions  . Eggs Or Egg-Derived Products Itching    Past Medical History  Diagnosis Date  . Essential hypertension, benign   . Other and unspecified hyperlipidemia   . Coronary atherosclerosis of unspecified type of vessel, native or graft   . GERD (gastroesophageal reflux disease)   . Lower back pain   . Osteoarthritis   . Angina   . Myocardial infarction 1997  . History of bronchitis   . Pneumonia 1957  . Sleep apnea   . PAD (peripheral artery disease)     with ABI's 0.8 on the right and 0.86 on the left  . DVT of lower extremity (deep  venous thrombosis) ~ 2010    LLE  . Shortness of breath     "lying down"  . Headache 02/18/12    "lately"  . PSVT (paroxysmal supraventricular tachycardia) 02/18/12    ROS: Negative except as per HPI  BP 112/70  Pulse 69  Ht 5\' 5"  (1.651 m)  Wt 75.751 kg (167 lb)  BMI 27.79 kg/m2  SpO2 94%  PHYSICAL EXAM: Pt is alert and oriented, NAD HEENT: normal Neck: JVP - normal, carotids 2+=  Lungs: CTA bilaterally CV: RRR with soft systolic ejection murmur at the left sternal border Abd: soft, NT, Positive BS, no hepatomegaly Ext: no C/C/E, DP pulses are 2+, PT pulses are nonpalpable, femoral pulses are 2+ and equal Skin: warm/dry no rash  EKG:  Normal sinus rhythm 69 beats per minute, age-indeterminate inferoposterior infarct.  ASSESSMENT AND PLAN: Lower extremity atherosclerosis with intermittent claudication. The patient remained stable without  symptoms at present. He is on an excellent medical program. Will plan on followup in 12 months with ABIs and Doppler study to evaluate his left SFA stent at that time.  Tonny Bollman 11/04/2012 11:53 AM

## 2012-11-04 NOTE — Patient Instructions (Addendum)
Your physician wants you to follow-up in: 1 YEAR with Dr Excell Seltzer.  You will receive a reminder letter in the mail two months in advance. If you don't receive a letter, please call our office to schedule the follow-up appointment.  Your physician has requested that you have an ankle brachial index (ABI) in 1 YEAR. During this test an ultrasound and blood pressure cuff are used to evaluate the arteries that supply the arms and legs with blood. Allow thirty minutes for this exam. There are no restrictions or special instructions.  Your physician has requested that you have a LEFT LEG lower extremity arterial duplex in 1 YEAR. This test is an ultrasound of the arteries in the leg. Allow one hour for Lower Arterial scans. There are no restrictions or special instructions

## 2013-01-27 ENCOUNTER — Ambulatory Visit (INDEPENDENT_AMBULATORY_CARE_PROVIDER_SITE_OTHER): Payer: Medicare Other | Admitting: Internal Medicine

## 2013-01-27 ENCOUNTER — Encounter: Payer: Self-pay | Admitting: Internal Medicine

## 2013-01-27 ENCOUNTER — Telehealth: Payer: Self-pay | Admitting: Cardiology

## 2013-01-27 VITALS — BP 118/72 | HR 76 | Temp 98.0°F | Resp 16 | Wt 170.0 lb

## 2013-01-27 DIAGNOSIS — G43109 Migraine with aura, not intractable, without status migrainosus: Secondary | ICD-10-CM | POA: Insufficient documentation

## 2013-01-27 DIAGNOSIS — I10 Essential (primary) hypertension: Secondary | ICD-10-CM

## 2013-01-27 DIAGNOSIS — K219 Gastro-esophageal reflux disease without esophagitis: Secondary | ICD-10-CM

## 2013-01-27 HISTORY — DX: Migraine with aura, not intractable, without status migrainosus: G43.109

## 2013-01-27 MED ORDER — IBUPROFEN 600 MG PO TABS: 600.0000 mg | ORAL_TABLET | Freq: Three times a day (TID) | ORAL | Status: DC | PRN

## 2013-01-27 MED ORDER — BUTALBITAL-ACETAMINOPHEN 50-300 MG PO TABS: 1.0000 | ORAL_TABLET | Freq: Two times a day (BID) | ORAL | Status: DC | PRN

## 2013-01-27 NOTE — Progress Notes (Signed)
Subjective:    Headache  This is a recurrent problem. The current episode started more than 1 month ago. The problem occurs intermittently. The problem has been waxing and waning. The pain is located in the occipital, retro-orbital and frontal region. The pain does not radiate. The pain quality is not similar to prior headaches. The quality of the pain is described as pulsating. The pain is at a severity of 8/10. The pain is severe. Associated symptoms include abdominal pain. Pertinent negatives include no back pain, coughing, dizziness, nausea, neck pain, sore throat or weakness. He has tried nothing for the symptoms.  He would see electric light lines and would loose vision partially for several min before HA would start.   F/u abd bloating with meals, pain - worse after spicy foods; epig pain. Better  F/u CAD  BP Readings from Last 3 Encounters:  01/27/13 118/72  11/04/12 112/70  10/03/12 130/84   Wt Readings from Last 3 Encounters:  01/27/13 170 lb (77.111 kg)  11/04/12 167 lb (75.751 kg)  10/03/12 170 lb (77.111 kg)       Review of Systems  Constitutional: Negative for appetite change, fatigue and unexpected weight change.  HENT: Negative for nosebleeds, congestion, sore throat, sneezing, trouble swallowing and neck pain.   Eyes: Negative for itching and visual disturbance.  Respiratory: Negative for cough.   Cardiovascular: Negative for chest pain, palpitations and leg swelling.  Gastrointestinal: Positive for abdominal pain. Negative for nausea, diarrhea, blood in stool and abdominal distention.  Genitourinary: Negative for frequency and hematuria.  Musculoskeletal: Negative for back pain, joint swelling and gait problem.  Skin: Negative for rash.  Neurological: Positive for headaches. Negative for dizziness, tremors, speech difficulty and weakness.  Psychiatric/Behavioral: Negative for sleep disturbance, dysphoric mood and agitation. The patient is not  nervous/anxious.        Objective:   Physical Exam  Constitutional: He is oriented to person, place, and time. He appears well-developed.  HENT:  Mouth/Throat: Oropharynx is clear and moist.  Eyes: Conjunctivae are normal. Pupils are equal, round, and reactive to light.  Neck: Normal range of motion. No JVD present. No thyromegaly present.  Cardiovascular: Normal rate, regular rhythm, normal heart sounds and intact distal pulses.  Exam reveals no gallop and no friction rub.   No murmur heard. Pulmonary/Chest: Breath sounds normal. No respiratory distress. He has no wheezes. He has no rales. He exhibits no tenderness.  Abdominal: Soft. Bowel sounds are normal. He exhibits no distension and no mass. There is no tenderness. There is no rebound and no guarding.  Musculoskeletal: Normal range of motion. He exhibits no edema and no tenderness.  Lymphadenopathy:    He has no cervical adenopathy.  Neurological: He is alert and oriented to person, place, and time. He has normal reflexes. No cranial nerve deficit. He exhibits normal muscle tone. Coordination normal.  Skin: Skin is warm and dry. No rash noted.  Psychiatric: He has a normal mood and affect. His behavior is normal. Judgment and thought content normal.   Lab Results  Component Value Date   WBC 5.9 02/19/2012   HGB 13.0 02/19/2012   HCT 38.1* 02/19/2012   PLT 216 02/19/2012   GLUCOSE 116* 02/19/2012   CHOL 206* 02/19/2012   TRIG 275* 02/19/2012   HDL 40 02/19/2012   LDLDIRECT 113.2 08/19/2011   LDLCALC 111* 02/19/2012   ALT 34 08/19/2011   AST 24 08/19/2011   NA 140 02/19/2012   K 4.2 02/19/2012  CL 108 02/19/2012   CREATININE 1.23 02/19/2012   BUN 20 02/19/2012   CO2 24 02/19/2012   TSH 2.213 02/18/2012   PSA 1.85 10/18/2007   INR 0.97 02/19/2012   HGBA1C  Value: 6.4 (NOTE)                                                                       According to the ADA Clinical Practice Recommendations for 2011, when HbA1c is used as a  screening test:   >=6.5%   Diagnostic of Diabetes Mellitus           (if abnormal result  is confirmed)  5.7-6.4%   Increased risk of developing Diabetes Mellitus  References:Diagnosis and Classification of Diabetes Mellitus,Diabetes Care,2011,34(Suppl 1):S62-S69 and Standards of Medical Care in         Diabetes - 2011,Diabetes Care,2011,34  (Suppl 1):S11-S61.* 04/02/2010          Assessment & Plan:

## 2013-01-27 NOTE — Telephone Encounter (Signed)
Patient has an appointment with his PCP Dr. Posey Rea at 11:00 am today, for dizzy spells.  Daughter, Cordarryl Monrreal, was made aware of pt's appointment,  pt is to keep that appointment because if pt symptoms is related to his heart condition the PCP will let Dr Jens Som  Know. Harvie Heck verbalized understanding.

## 2013-01-27 NOTE — Assessment & Plan Note (Signed)
3/14 new CT head Fiorinal prn

## 2013-01-27 NOTE — Assessment & Plan Note (Signed)
Doing well 

## 2013-01-27 NOTE — Telephone Encounter (Signed)
New problem    pts daughter randy says pt having dizzy spells since yesterday

## 2013-01-27 NOTE — Telephone Encounter (Signed)
Pt daughter called because she said pt has dizzy spells and light headache x 2 yesterday and today still continue having the spells. Pt's daughter states when he has the spells  Yesterday , pt experience lose of his eye  site for a few seconds . At the time pt was walking around in the kitchen. Pt's doughter does not know what pt's BP is running. Pt is C/O of being more SOB now. Pt would like to be seen today.

## 2013-01-27 NOTE — Assessment & Plan Note (Signed)
Better  

## 2013-01-29 ENCOUNTER — Encounter: Payer: Self-pay | Admitting: Internal Medicine

## 2013-01-30 ENCOUNTER — Other Ambulatory Visit: Payer: Medicare Other

## 2013-01-31 ENCOUNTER — Ambulatory Visit (INDEPENDENT_AMBULATORY_CARE_PROVIDER_SITE_OTHER)
Admission: RE | Admit: 2013-01-31 | Discharge: 2013-01-31 | Disposition: A | Discharge status: Home / Self Care | Payer: Medicare Other | Source: Ambulatory Visit | Attending: Internal Medicine | Admitting: Internal Medicine

## 2013-01-31 DIAGNOSIS — I10 Essential (primary) hypertension: Secondary | ICD-10-CM

## 2013-01-31 DIAGNOSIS — G43109 Migraine with aura, not intractable, without status migrainosus: Secondary | ICD-10-CM

## 2013-02-01 NOTE — Progress Notes (Signed)
Quick Note:  Patient Informed and voiced understanding ______ 

## 2013-02-21 ENCOUNTER — Ambulatory Visit: Payer: Medicare Other | Admitting: Gastroenterology

## 2013-06-21 DIAGNOSIS — R0989 Other specified symptoms and signs involving the circulatory and respiratory systems: Secondary | ICD-10-CM

## 2013-07-10 ENCOUNTER — Encounter (INDEPENDENT_AMBULATORY_CARE_PROVIDER_SITE_OTHER): Payer: Medicare Other

## 2013-07-10 DIAGNOSIS — I6529 Occlusion and stenosis of unspecified carotid artery: Secondary | ICD-10-CM

## 2013-07-10 DIAGNOSIS — H53129 Transient visual loss, unspecified eye: Secondary | ICD-10-CM

## 2013-07-13 ENCOUNTER — Telehealth: Payer: Self-pay | Admitting: Cardiology

## 2013-07-13 NOTE — Telephone Encounter (Signed)
New Problem ° °Pt returned call  °

## 2013-07-17 NOTE — Telephone Encounter (Signed)
Pt advised, he verbalized understanding. 

## 2013-12-08 ENCOUNTER — Encounter: Payer: Self-pay | Admitting: Cardiovascular Disease

## 2013-12-08 ENCOUNTER — Ambulatory Visit (INDEPENDENT_AMBULATORY_CARE_PROVIDER_SITE_OTHER): Payer: Medicare HMO | Admitting: Cardiovascular Disease

## 2013-12-08 VITALS — BP 136/84 | HR 69 | Ht 65.0 in | Wt 165.0 lb

## 2013-12-08 DIAGNOSIS — I872 Venous insufficiency (chronic) (peripheral): Secondary | ICD-10-CM

## 2013-12-08 DIAGNOSIS — I739 Peripheral vascular disease, unspecified: Secondary | ICD-10-CM

## 2013-12-08 NOTE — Progress Notes (Signed)
HPI:  68 year old gentleman presenting for followup evaluation. He's undergone left SFA stenting. ABIs one year ago were normal at 1.1 on the right and 0.96 and left. Limited duplex evaluation showed a widely patent stent. The patient has coronary artery disease and he is followed by Dr. Stanford Breed.  The patient is doing fairly well. He's had no recent chest pain or shortness of breath. He has minimized his medications and even taken himself off of his statin drug. He has only regularly been taking a daily aspirin and a vitamin. He denies palpitations, orthopnea, or PND. He went for a long walk yesterday and had some left hip pain. He has not had typical symptoms of calf claudication. He quit smoking 3 years ago.  Outpatient Encounter Prescriptions as of 12/08/2013  Medication Sig  . aspirin 81 MG tablet Take 81 mg by mouth daily.    . Calcium Carbonate Antacid (TUMS PO) Take by mouth as directed.  . nitroGLYCERIN (NITROSTAT) 0.4 MG SL tablet Place 0.4 mg under the tongue every 5 (five) minutes as needed. For chest pain  . CRESTOR 40 MG tablet TAKE ONE TABLET BY MOUTH NIGHTLY AT BEDTIME  . [DISCONTINUED] Butalbital-Acetaminophen (BUPAP) 50-300 MG TABS Take 1 tablet by mouth 2 (two) times daily as needed.  . [DISCONTINUED] esomeprazole (NEXIUM) 40 MG capsule Take 1 capsule (40 mg total) by mouth daily.  . [DISCONTINUED] ibuprofen (ADVIL,MOTRIN) 600 MG tablet Take 1 tablet (600 mg total) by mouth every 8 (eight) hours as needed for pain (headache).  . [DISCONTINUED] isosorbide mononitrate (IMDUR) 30 MG 24 hr tablet Take 1 tablet (30 mg total) by mouth daily.  . [DISCONTINUED] metoprolol succinate (TOPROL XL) 25 MG 24 hr tablet Take 1 tablet (25 mg total) by mouth daily. Take with or immediately following a meal.    Allergies  Allergen Reactions  . Eggs Or Egg-Derived Products Itching    Past Medical History  Diagnosis Date  . Essential hypertension, benign   . Other and unspecified  hyperlipidemia   . Coronary atherosclerosis of unspecified type of vessel, native or graft   . GERD (gastroesophageal reflux disease)   . Lower back pain   . Osteoarthritis   . Angina   . Myocardial infarction 1997  . History of bronchitis   . Pneumonia 1957  . Sleep apnea   . PAD (peripheral artery disease)     with ABI's 0.8 on the right and 0.86 on the left  . DVT of lower extremity (deep venous thrombosis) ~ 2010    LLE  . Shortness of breath     "lying down"  . Headache(784.0) 02/18/12    "lately"  . PSVT (paroxysmal supraventricular tachycardia) 02/18/12  . Diverticulosis   . Erosive gastritis     ROS: Negative except as per HPI  BP 136/84  Pulse 69  Ht 5\' 5"  (1.651 m)  Wt 165 lb (74.844 kg)  BMI 27.46 kg/m2  PHYSICAL EXAM: Pt is alert and oriented, NAD HEENT: normal Neck: JVP - normal, carotids 2+= without bruits Lungs: CTA bilaterally CV: RRR without murmur or gallop Abd: soft, NT, Positive BS, no hepatomegaly Ext: no C/C/E, DP and PT pulses diminished bilaterally Skin: warm/dry no rash  EKG:  Normal sinus rhythm 67 beats per minute, age indeterminate inferior infarct, otherwise within normal limits.  ASSESSMENT AND PLAN: 68 year old gentleman with lower extremity peripheral arterial disease. He's on antiplatelet therapy with aspirin. I advised that he start back on his Crestor 40 mg daily. He  is willing to do that. His pedal pulses are diminished but palpable and he seems to be asymptomatic. Will repeat ABIs and a left lower extremity arterial Doppler with his history of left SFA stenting.  Other cardiac issues followed by Dr Stanford Breed. Pt appears stable without sx's of angina.  [name] 12/08/2013 11:30 AM

## 2013-12-08 NOTE — Patient Instructions (Addendum)
Your physician has requested that you have an ankle brachial index (ABI). During this test an ultrasound and blood pressure cuff are used to evaluate the arteries that supply the arms and legs with blood. Allow thirty minutes for this exam. There are no restrictions or special instructions.  Your physician has requested that you have a left lower extremity arterial duplex. This test is an ultrasound of the arteries in the legs or arms. It looks at arterial blood flow in the legs and arms. Allow one hour for Lower and Upper Arterial scans. There are no restrictions or special instructions  Your physician wants you to follow-up in: 1 year with Dr. [name].  You will receive a reminder letter in the mail two months in advance. If you don't receive a letter, please call our office to schedule the follow-up appointment.  Your physician recommends that you continue on your current medications as directed. Please refer to the Current Medication list given to you today.

## 2013-12-13 ENCOUNTER — Ambulatory Visit (HOSPITAL_COMMUNITY): Payer: Medicare HMO

## 2013-12-13 ENCOUNTER — Ambulatory Visit (HOSPITAL_COMMUNITY): Payer: Medicare HMO | Attending: Cardiology

## 2013-12-13 DIAGNOSIS — I872 Venous insufficiency (chronic) (peripheral): Secondary | ICD-10-CM

## 2013-12-13 DIAGNOSIS — I2510 Atherosclerotic heart disease of native coronary artery without angina pectoris: Secondary | ICD-10-CM | POA: Insufficient documentation

## 2013-12-13 DIAGNOSIS — I739 Peripheral vascular disease, unspecified: Secondary | ICD-10-CM

## 2013-12-13 DIAGNOSIS — Z87891 Personal history of nicotine dependence: Secondary | ICD-10-CM | POA: Insufficient documentation

## 2013-12-13 DIAGNOSIS — E785 Hyperlipidemia, unspecified: Secondary | ICD-10-CM | POA: Insufficient documentation

## 2013-12-13 DIAGNOSIS — I10 Essential (primary) hypertension: Secondary | ICD-10-CM | POA: Insufficient documentation

## 2013-12-14 ENCOUNTER — Telehealth: Payer: Self-pay | Admitting: Nurse Practitioner

## 2013-12-14 NOTE — Telephone Encounter (Signed)
Message copied by Emmaline Life on Thu Dec 14, 2013  1:44 PM ------      Message from: Vashti Hey D      Created: Wed Dec 13, 2013  3:25 PM      Regarding: ABI'S       12/13/13 Patient cancel. ------

## 2013-12-14 NOTE — Telephone Encounter (Signed)
Noted that patient cancelled appointments for 2/11 for Left LEA and ABIs

## 2014-01-19 ENCOUNTER — Other Ambulatory Visit: Payer: Self-pay | Admitting: Cardiology

## 2014-03-29 ENCOUNTER — Other Ambulatory Visit: Payer: Self-pay | Admitting: *Deleted

## 2014-03-29 MED ORDER — ROSUVASTATIN CALCIUM 40 MG PO TABS: ORAL_TABLET | ORAL | Status: DC

## 2014-08-22 ENCOUNTER — Ambulatory Visit (INDEPENDENT_AMBULATORY_CARE_PROVIDER_SITE_OTHER): Payer: Commercial Managed Care - HMO | Admitting: Family

## 2014-08-22 ENCOUNTER — Encounter: Payer: Self-pay | Admitting: Family

## 2014-08-22 VITALS — BP 128/82 | HR 84 | Temp 97.8°F | Resp 16 | Ht 65.0 in | Wt 166.0 lb

## 2014-08-22 DIAGNOSIS — J209 Acute bronchitis, unspecified: Secondary | ICD-10-CM

## 2014-08-22 MED ORDER — HYDROCODONE-HOMATROPINE 5-1.5 MG/5ML PO SYRP: 5.0000 mL | ORAL_SOLUTION | Freq: Three times a day (TID) | ORAL | Status: DC | PRN

## 2014-08-22 NOTE — Assessment & Plan Note (Signed)
Symptoms consistent with bronchitis. Stop Tussinex and start Hycodan. Continue supportive over the counter therapy as needed. Discussed adding an inhaled corticosteroid, but declined at this time. Informed cough may last another week or two but improvement should be noted. Follow up if symptoms worsen or fail to improve.

## 2014-08-22 NOTE — Progress Notes (Signed)
Pre visit review using our clinic review tool, if applicable. No additional management support is needed unless otherwise documented below in the visit note. 

## 2014-08-22 NOTE — Progress Notes (Signed)
   Subjective:    Patient ID: Harold Waters, male    DOB: [birth year], 68 y.o.   MRN: [id number]  Chief Complaint  Patient presents with  . Cough    x3 weeks, chest is starting to hurt from coughing so bad    HPI:  Harold Waters is a 68 y.o. male who presents today for a cough.  Cough has been going on for 3 weeks. The intensity has increased effecting his ability to sleep. Describes achy headache last night that he treated with Tylenol.  Throat is sore with runny nose. Been mostly a dry cough. Denies fever or seasonal allergies. Went to urgent care and received cough syrup, inhaler, antibiotic and medrol dose pack. Provided minimal relief. Overall feeling fatigued from coughing. Sometimes it gets better but it comes right back up.   Allergies  Allergen Reactions  . Eggs Or Egg-Derived Products Itching   Current Outpatient Prescriptions on File Prior to Visit  Medication Sig Dispense Refill  . aspirin 81 MG tablet Take 81 mg by mouth daily.        . Calcium Carbonate Antacid (TUMS PO) Take by mouth as directed.      . nitroGLYCERIN (NITROSTAT) 0.4 MG SL tablet Place 0.4 mg under the tongue every 5 (five) minutes as needed. For chest pain      . rosuvastatin (CRESTOR) 40 MG tablet Take one tablet by mouth at bedtime  90 tablet  1   No current facility-administered medications on file prior to visit.   Review of Systems  See HPI    Objective:    BP 128/82  Pulse 84  Temp(Src) 97.8 F (36.6 C) (Oral)  Resp 16  Ht 5\' 5"  (1.651 m)  Wt 166 lb (75.297 kg)  BMI 27.62 kg/m2  SpO2 95% Nursing note and vital signs reviewed.  Physical Exam  Constitutional: He appears well-developed and well-nourished. No distress (distressed when coughing.).  HENT:  Right Ear: Hearing, tympanic membrane, external ear and ear canal normal.  Left Ear: Hearing, tympanic membrane, external ear and ear canal normal.  Nose: Right sinus exhibits no maxillary sinus tenderness and no frontal sinus  tenderness. Left sinus exhibits no maxillary sinus tenderness and no frontal sinus tenderness.  Mouth/Throat: Uvula is midline and mucous membranes are normal. Posterior oropharyngeal erythema present. No oropharyngeal exudate.  Mild turbinate edema noted on left.   Cardiovascular: Normal rate, regular rhythm and normal heart sounds.   Pulmonary/Chest: Effort normal and breath sounds normal.  Skin: Skin is warm and dry.  Psychiatric: He has a normal mood and affect. His behavior is normal. Judgment and thought content normal.       Assessment & Plan:

## 2014-08-22 NOTE — Patient Instructions (Signed)
Thank you for choosing [facility].  Summary/Instructions:   Please continue your over the counter medications as needed for symptom relief.     Acute Bronchitis Bronchitis is inflammation of the airways that extend from the windpipe into the lungs (bronchi). The inflammation often causes mucus to develop. This leads to a cough, which is the most common symptom of bronchitis.  In acute bronchitis, the condition usually develops suddenly and goes away over time, usually in a couple weeks. Smoking, allergies, and asthma can make bronchitis worse. Repeated episodes of bronchitis may cause further lung problems.  CAUSES Acute bronchitis is most often caused by the same virus that causes a cold. The virus can spread from person to person (contagious) through coughing, sneezing, and touching contaminated objects. SIGNS AND SYMPTOMS   Cough.   Fever.   Coughing up mucus.   Body aches.   Chest congestion.   Chills.   Shortness of breath.   Sore throat.  DIAGNOSIS  Acute bronchitis is usually diagnosed through a physical exam. Your health care provider will also ask you questions about your medical history. Tests, such as chest X-rays, are sometimes done to rule out other conditions.  TREATMENT  Acute bronchitis usually goes away in a couple weeks. Oftentimes, no medical treatment is necessary. Medicines are sometimes given for relief of fever or cough. Antibiotic medicines are usually not needed but may be prescribed in certain situations. In some cases, an inhaler may be recommended to help reduce shortness of breath and control the cough. A cool mist vaporizer may also be used to help thin bronchial secretions and make it easier to clear the chest.  HOME CARE INSTRUCTIONS  Get plenty of rest.   Drink enough fluids to keep your urine clear or pale yellow (unless you have a medical condition that requires fluid restriction). Increasing fluids may help thin your  respiratory secretions (sputum) and reduce chest congestion, and it will prevent dehydration.   Take medicines only as directed by your health care provider.  If you were prescribed an antibiotic medicine, finish it all even if you start to feel better.  Avoid smoking and secondhand smoke. Exposure to cigarette smoke or irritating chemicals will make bronchitis worse. If you are a smoker, consider using nicotine gum or skin patches to help control withdrawal symptoms. Quitting smoking will help your lungs heal faster.   Reduce the chances of another bout of acute bronchitis by washing your hands frequently, avoiding people with cold symptoms, and trying not to touch your hands to your mouth, nose, or eyes.   Keep all follow-up visits as directed by your health care provider.  SEEK MEDICAL CARE IF: Your symptoms do not improve after 1 week of treatment.  SEEK IMMEDIATE MEDICAL CARE IF:  You develop an increased fever or chills.   You have chest pain.   You have severe shortness of breath.  You have bloody sputum.   You develop dehydration.  You faint or repeatedly feel like you are going to pass out.  You develop repeated vomiting.  You develop a severe headache. MAKE SURE YOU:   Understand these instructions.  Will watch your condition.  Will get help right away if you are not doing well or get worse. Document Released: 11/26/2004 Document Revised: 03/05/2014 Document Reviewed: 04/11/2013 [facility] Patient Information 2015 [place], [place]. This information is not intended to replace advice given to you by your health care provider. Make sure you discuss any questions you have with your health  care provider.

## 2014-10-11 ENCOUNTER — Encounter (HOSPITAL_COMMUNITY): Payer: Self-pay | Admitting: Cardiology

## 2014-11-27 ENCOUNTER — Ambulatory Visit: Payer: Commercial Managed Care - HMO | Admitting: Cardiovascular Disease

## 2014-12-10 ENCOUNTER — Ambulatory Visit: Payer: Commercial Managed Care - HMO | Admitting: Cardiovascular Disease

## 2014-12-19 ENCOUNTER — Encounter: Payer: Self-pay | Admitting: Internal Medicine

## 2014-12-19 ENCOUNTER — Ambulatory Visit (INDEPENDENT_AMBULATORY_CARE_PROVIDER_SITE_OTHER): Payer: Commercial Managed Care - HMO | Admitting: Internal Medicine

## 2014-12-19 VITALS — BP 120/74 | HR 80 | Temp 98.3°F | Ht 65.0 in | Wt 169.0 lb

## 2014-12-19 DIAGNOSIS — Z23 Encounter for immunization: Secondary | ICD-10-CM

## 2014-12-19 DIAGNOSIS — I2583 Coronary atherosclerosis due to lipid rich plaque: Secondary | ICD-10-CM

## 2014-12-19 DIAGNOSIS — D485 Neoplasm of uncertain behavior of skin: Secondary | ICD-10-CM

## 2014-12-19 DIAGNOSIS — N320 Bladder-neck obstruction: Secondary | ICD-10-CM

## 2014-12-19 DIAGNOSIS — K219 Gastro-esophageal reflux disease without esophagitis: Secondary | ICD-10-CM

## 2014-12-19 DIAGNOSIS — I2510 Atherosclerotic heart disease of native coronary artery without angina pectoris: Secondary | ICD-10-CM

## 2014-12-19 DIAGNOSIS — Z0000 Encounter for general adult medical examination without abnormal findings: Secondary | ICD-10-CM | POA: Insufficient documentation

## 2014-12-19 DIAGNOSIS — E785 Hyperlipidemia, unspecified: Secondary | ICD-10-CM

## 2014-12-19 MED ORDER — OMEPRAZOLE 20 MG PO CPDR: 20.0000 mg | DELAYED_RELEASE_CAPSULE | Freq: Every day | ORAL | Status: DC

## 2014-12-19 NOTE — Patient Instructions (Signed)
Preventive Care for Adults A healthy lifestyle and preventive care can promote health and wellness. Preventive health guidelines for men include the following key practices:  A routine yearly physical is a good way to check with your health care provider about your health and preventative screening. It is a chance to share any concerns and updates on your health and to receive a thorough exam.  Visit your dentist for a routine exam and preventative care every 6 months. Brush your teeth twice a day and floss once a day. Good oral hygiene prevents tooth decay and gum disease.  The frequency of eye exams is based on your age, health, family medical history, use of contact lenses, and other factors. Follow your health care provider's recommendations for frequency of eye exams.  Eat a healthy diet. Foods such as vegetables, fruits, whole grains, low-fat dairy products, and lean protein foods contain the nutrients you need without too many calories. Decrease your intake of foods high in solid fats, added sugars, and salt. Eat the right amount of calories for you.Get information about a proper diet from your health care provider, if necessary.  Regular physical exercise is one of the most important things you can do for your health. Most adults should get at least 150 minutes of moderate-intensity exercise (any activity that increases your heart rate and causes you to sweat) each week. In addition, most adults need muscle-strengthening exercises on 2 or more days a week.  Maintain a healthy weight. The body mass index (BMI) is a screening tool to identify possible weight problems. It provides an estimate of body fat based on height and weight. Your health care provider can find your BMI and can help you achieve or maintain a healthy weight.For adults 20 years and older:  A BMI below 18.5 is considered underweight.  A BMI of 18.5 to 24.9 is normal.  A BMI of 25 to 29.9 is considered overweight.  A BMI  of 30 and above is considered obese.  Maintain normal blood lipids and cholesterol levels by exercising and minimizing your intake of saturated fat. Eat a balanced diet with plenty of fruit and vegetables. Blood tests for lipids and cholesterol should begin at age 50 and be repeated every 5 years. If your lipid or cholesterol levels are high, you are over 50, or you are at high risk for heart disease, you may need your cholesterol levels checked more frequently.Ongoing high lipid and cholesterol levels should be treated with medicines if diet and exercise are not working.  If you smoke, find out from your health care provider how to quit. If you do not use tobacco, do not start.  Lung cancer screening is recommended for adults aged 73-80 years who are at high risk for developing lung cancer because of a history of smoking. A yearly low-dose CT scan of the lungs is recommended for people who have at least a 30-pack-year history of smoking and are a current smoker or have quit within the past 15 years. A pack year of smoking is smoking an average of 1 pack of cigarettes a day for 1 year (for example: 1 pack a day for 30 years or 2 packs a day for 15 years). Yearly screening should continue until the smoker has stopped smoking for at least 15 years. Yearly screening should be stopped for people who develop a health problem that would prevent them from having lung cancer treatment.  If you choose to drink alcohol, do not have more than  2 drinks per day. One drink is considered to be 12 ounces (355 mL) of beer, 5 ounces (148 mL) of wine, or 1.5 ounces (44 mL) of liquor.  Avoid use of street drugs. Do not share needles with anyone. Ask for help if you need support or instructions about stopping the use of drugs.  High blood pressure causes heart disease and increases the risk of stroke. Your blood pressure should be checked at least every 1-2 years. Ongoing high blood pressure should be treated with  medicines, if weight loss and exercise are not effective.  If you are 45-79 years old, ask your health care provider if you should take aspirin to prevent heart disease.  Diabetes screening involves taking a blood sample to check your fasting blood sugar level. This should be done once every 3 years, after age 45, if you are within normal weight and without risk factors for diabetes. Testing should be considered at a younger age or be carried out more frequently if you are overweight and have at least 1 risk factor for diabetes.  Colorectal cancer can be detected and often prevented. Most routine colorectal cancer screening begins at the age of 50 and continues through age 75. However, your health care provider may recommend screening at an earlier age if you have risk factors for colon cancer. On a yearly basis, your health care provider may provide home test kits to check for hidden blood in the stool. Use of a small camera at the end of a tube to directly examine the colon (sigmoidoscopy or colonoscopy) can detect the earliest forms of colorectal cancer. Talk to your health care provider about this at age 50, when routine screening begins. Direct exam of the colon should be repeated every 5-10 years through age 75, unless early forms of precancerous polyps or small growths are found.  People who are at an increased risk for hepatitis B should be screened for this virus. You are considered at high risk for hepatitis B if:  You were born in a country where hepatitis B occurs often. Talk with your health care provider about which countries are considered high risk.  Your parents were born in a high-risk country and you have not received a shot to protect against hepatitis B (hepatitis B vaccine).  You have HIV or AIDS.  You use needles to inject street drugs.  You live with, or have sex with, someone who has hepatitis B.  You are a man who has sex with other men (MSM).  You get hemodialysis  treatment.  You take certain medicines for conditions such as cancer, organ transplantation, and autoimmune conditions.  Hepatitis C blood testing is recommended for all people born from 1945 through 1965 and any individual with known risks for hepatitis C.  Practice safe sex. Use condoms and avoid high-risk sexual practices to reduce the spread of sexually transmitted infections (STIs). STIs include gonorrhea, chlamydia, syphilis, trichomonas, herpes, HPV, and human immunodeficiency virus (HIV). Herpes, HIV, and HPV are viral illnesses that have no cure. They can result in disability, cancer, and death.  If you are at risk of being infected with HIV, it is recommended that you take a prescription medicine daily to prevent HIV infection. This is called preexposure prophylaxis (PrEP). You are considered at risk if:  You are a man who has sex with other men (MSM) and have other risk factors.  You are a heterosexual man, are sexually active, and are at increased risk for HIV infection.    You take drugs by injection.  You are sexually active with a partner who has HIV.  Talk with your health care provider about whether you are at high risk of being infected with HIV. If you choose to begin PrEP, you should first be tested for HIV. You should then be tested every 3 months for as long as you are taking PrEP.  A one-time screening for abdominal aortic aneurysm (AAA) and surgical repair of large AAAs by ultrasound are recommended for men ages 32 to 67 years who are current or former smokers.  Healthy men should no longer receive prostate-specific antigen (PSA) blood tests as part of routine cancer screening. Talk with your health care provider about prostate cancer screening.  Testicular cancer screening is not recommended for adult males who have no symptoms. Screening includes self-exam, a health care provider exam, and other screening tests. Consult with your health care provider about any symptoms  you have or any concerns you have about testicular cancer.  Use sunscreen. Apply sunscreen liberally and repeatedly throughout the day. You should seek shade when your shadow is shorter than you. Protect yourself by wearing long sleeves, pants, a wide-brimmed hat, and sunglasses year round, whenever you are outdoors.  Once a month, do a whole-body skin exam, using a mirror to look at the skin on your back. Tell your health care provider about new moles, moles that have irregular borders, moles that are larger than a pencil eraser, or moles that have changed in shape or color.  Stay current with required vaccines (immunizations).  Influenza vaccine. All adults should be immunized every year.  Tetanus, diphtheria, and acellular pertussis (Td, Tdap) vaccine. An adult who has not previously received Tdap or who does not know his vaccine status should receive 1 dose of Tdap. This initial dose should be followed by tetanus and diphtheria toxoids (Td) booster doses every 10 years. Adults with an unknown or incomplete history of completing a 3-dose immunization series with Td-containing vaccines should begin or complete a primary immunization series including a Tdap dose. Adults should receive a Td booster every 10 years.  Varicella vaccine. An adult without evidence of immunity to varicella should receive 2 doses or a second dose if he has previously received 1 dose.  Human papillomavirus (HPV) vaccine. Males aged 68-21 years who have not received the vaccine previously should receive the 3-dose series. Males aged 22-26 years may be immunized. Immunization is recommended through the age of 6 years for any male who has sex with males and did not get any or all doses earlier. Immunization is recommended for any person with an immunocompromised condition through the age of 49 years if he did not get any or all doses earlier. During the 3-dose series, the second dose should be obtained 4-8 weeks after the first  dose. The third dose should be obtained 24 weeks after the first dose and 16 weeks after the second dose.  Zoster vaccine. One dose is recommended for adults aged 50 years or older unless certain conditions are present.  Measles, mumps, and rubella (MMR) vaccine. Adults born before [birth year] generally are considered immune to measles and mumps. Adults born in [birth year] or later should have 1 or more doses of MMR vaccine unless there is a contraindication to the vaccine or there is laboratory evidence of immunity to each of the three diseases. A routine second dose of MMR vaccine should be obtained at least 28 days after the first dose for students attending postsecondary  schools, health care workers, or international travelers. People who received inactivated measles vaccine or an unknown type of measles vaccine during 1963-1967 should receive 2 doses of MMR vaccine. People who received inactivated mumps vaccine or an unknown type of mumps vaccine before 1979 and are at high risk for mumps infection should consider immunization with 2 doses of MMR vaccine. Unvaccinated health care workers born before 1957 who lack laboratory evidence of measles, mumps, or rubella immunity or laboratory confirmation of disease should consider measles and mumps immunization with 2 doses of MMR vaccine or rubella immunization with 1 dose of MMR vaccine.  Pneumococcal 13-valent conjugate (PCV13) vaccine. When indicated, a person who is uncertain of his immunization history and has no record of immunization should receive the PCV13 vaccine. An adult aged 19 years or older who has certain medical conditions and has not been previously immunized should receive 1 dose of PCV13 vaccine. This PCV13 should be followed with a dose of pneumococcal polysaccharide (PPSV23) vaccine. The PPSV23 vaccine dose should be obtained at least 8 weeks after the dose of PCV13 vaccine. An adult aged 19 years or older who has certain medical conditions and  previously received 1 or more doses of PPSV23 vaccine should receive 1 dose of PCV13. The PCV13 vaccine dose should be obtained 1 or more years after the last PPSV23 vaccine dose.  Pneumococcal polysaccharide (PPSV23) vaccine. When PCV13 is also indicated, PCV13 should be obtained first. All adults aged 65 years and older should be immunized. An adult younger than age 65 years who has certain medical conditions should be immunized. Any person who resides in a nursing home or long-term care facility should be immunized. An adult smoker should be immunized. People with an immunocompromised condition and certain other conditions should receive both PCV13 and PPSV23 vaccines. People with human immunodeficiency virus (HIV) infection should be immunized as soon as possible after diagnosis. Immunization during chemotherapy or radiation therapy should be avoided. Routine use of PPSV23 vaccine is not recommended for American Indians, [place] Natives, or people younger than 65 years unless there are medical conditions that require PPSV23 vaccine. When indicated, people who have unknown immunization and have no record of immunization should receive PPSV23 vaccine. One-time revaccination 5 years after the first dose of PPSV23 is recommended for people aged 19-64 years who have chronic kidney failure, nephrotic syndrome, asplenia, or immunocompromised conditions. People who received 1-2 doses of PPSV23 before age 65 years should receive another dose of PPSV23 vaccine at age 65 years or later if at least 5 years have passed since the previous dose. Doses of PPSV23 are not needed for people immunized with PPSV23 at or after age 65 years.  Meningococcal vaccine. Adults with asplenia or persistent complement component deficiencies should receive 2 doses of quadrivalent meningococcal conjugate (MenACWY-D) vaccine. The doses should be obtained at least 2 months apart. Microbiologists working with certain meningococcal bacteria,  military recruits, people at risk during an outbreak, and people who travel to or live in countries with a high rate of meningitis should be immunized. A first-year college student up through age 21 years who is living in a residence hall should receive a dose if he did not receive a dose on or after his 16th birthday. Adults who have certain high-risk conditions should receive one or more doses of vaccine.  Hepatitis A vaccine. Adults who wish to be protected from this disease, have certain high-risk conditions, work with hepatitis A-infected animals, work in hepatitis A research labs, or   travel to or work in countries with a high rate of hepatitis A should be immunized. Adults who were previously unvaccinated and who anticipate close contact with an international adoptee during the first 60 days after arrival in the [place] from a country with a high rate of hepatitis A should be immunized.  Hepatitis B vaccine. Adults should be immunized if they wish to be protected from this disease, have certain high-risk conditions, may be exposed to blood or other infectious body fluids, are household contacts or sex partners of hepatitis B positive people, are clients or workers in certain care facilities, or travel to or work in countries with a high rate of hepatitis B.  Haemophilus influenzae type b (Hib) vaccine. A previously unvaccinated person with asplenia or sickle cell disease or having a scheduled splenectomy should receive 1 dose of Hib vaccine. Regardless of previous immunization, a recipient of a hematopoietic stem cell transplant should receive a 3-dose series 6-12 months after his successful transplant. Hib vaccine is not recommended for adults with HIV infection. Preventive Service / Frequency Ages 52 to 17  Blood pressure check.** / Every 1 to 2 years.  Lipid and cholesterol check.** / Every 5 years beginning at age 69.  Hepatitis C blood test.** / For any individual with known risks for  hepatitis C.  Skin self-exam. / Monthly.  Influenza vaccine. / Every year.  Tetanus, diphtheria, and acellular pertussis (Tdap, Td) vaccine.** / Consult your health care provider. 1 dose of Td every 10 years.  Varicella vaccine.** / Consult your health care provider.  HPV vaccine. / 3 doses over 6 months, if 72 or younger.  Measles, mumps, rubella (MMR) vaccine.** / You need at least 1 dose of MMR if you were born in 1957 or later. You may also need a second dose.  Pneumococcal 13-valent conjugate (PCV13) vaccine.** / Consult your health care provider.  Pneumococcal polysaccharide (PPSV23) vaccine.** / 1 to 2 doses if you smoke cigarettes or if you have certain conditions.  Meningococcal vaccine.** / 1 dose if you are age 35 to 60 years and a Market researcher living in a residence hall, or have one of several medical conditions. You may also need additional booster doses.  Hepatitis A vaccine.** / Consult your health care provider.  Hepatitis B vaccine.** / Consult your health care provider.  Haemophilus influenzae type b (Hib) vaccine.** / Consult your health care provider. Ages 35 to 8  Blood pressure check.** / Every 1 to 2 years.  Lipid and cholesterol check.** / Every 5 years beginning at age 57.  Lung cancer screening. / Every year if you are aged 44-80 years and have a 30-pack-year history of smoking and currently smoke or have quit within the past 15 years. Yearly screening is stopped once you have quit smoking for at least 15 years or develop a health problem that would prevent you from having lung cancer treatment.  Fecal occult blood test (FOBT) of stool. / Every year beginning at age 55 and continuing until age 73. You may not have to do this test if you get a colonoscopy every 10 years.  Flexible sigmoidoscopy** or colonoscopy.** / Every 5 years for a flexible sigmoidoscopy or every 10 years for a colonoscopy beginning at age 28 and continuing until age  1.  Hepatitis C blood test.** / For all people born from [birth year] through 1965 and any individual with known risks for hepatitis C.  Skin self-exam. / Monthly.  Influenza vaccine. / Every  year.  Tetanus, diphtheria, and acellular pertussis (Tdap/Td) vaccine.** / Consult your health care provider. 1 dose of Td every 10 years.  Varicella vaccine.** / Consult your health care provider.  Zoster vaccine.** / 1 dose for adults aged 53 years or older.  Measles, mumps, rubella (MMR) vaccine.** / You need at least 1 dose of MMR if you were born in 1957 or later. You may also need a second dose.  Pneumococcal 13-valent conjugate (PCV13) vaccine.** / Consult your health care provider.  Pneumococcal polysaccharide (PPSV23) vaccine.** / 1 to 2 doses if you smoke cigarettes or if you have certain conditions.  Meningococcal vaccine.** / Consult your health care provider.  Hepatitis A vaccine.** / Consult your health care provider.  Hepatitis B vaccine.** / Consult your health care provider.  Haemophilus influenzae type b (Hib) vaccine.** / Consult your health care provider. Ages 77 and over  Blood pressure check.** / Every 1 to 2 years.  Lipid and cholesterol check.**/ Every 5 years beginning at age 85.  Lung cancer screening. / Every year if you are aged 55-80 years and have a 30-pack-year history of smoking and currently smoke or have quit within the past 15 years. Yearly screening is stopped once you have quit smoking for at least 15 years or develop a health problem that would prevent you from having lung cancer treatment.  Fecal occult blood test (FOBT) of stool. / Every year beginning at age 33 and continuing until age 11. You may not have to do this test if you get a colonoscopy every 10 years.  Flexible sigmoidoscopy** or colonoscopy.** / Every 5 years for a flexible sigmoidoscopy or every 10 years for a colonoscopy beginning at age 28 and continuing until age 73.  Hepatitis C blood  test.** / For all people born from [birth year] through 1965 and any individual with known risks for hepatitis C.  Abdominal aortic aneurysm (AAA) screening.** / A one-time screening for ages 50 to 27 years who are current or former smokers.  Skin self-exam. / Monthly.  Influenza vaccine. / Every year.  Tetanus, diphtheria, and acellular pertussis (Tdap/Td) vaccine.** / 1 dose of Td every 10 years.  Varicella vaccine.** / Consult your health care provider.  Zoster vaccine.** / 1 dose for adults aged 34 years or older.  Pneumococcal 13-valent conjugate (PCV13) vaccine.** / Consult your health care provider.  Pneumococcal polysaccharide (PPSV23) vaccine.** / 1 dose for all adults aged 63 years and older.  Meningococcal vaccine.** / Consult your health care provider.  Hepatitis A vaccine.** / Consult your health care provider.  Hepatitis B vaccine.** / Consult your health care provider.  Haemophilus influenzae type b (Hib) vaccine.** / Consult your health care provider. **Family history and personal history of risk and conditions may change your health care provider's recommendations. Document Released: 12/15/2001 Document Revised: 10/24/2013 Document Reviewed: 03/16/2011 [facility] Patient Information 2015 [place], [place]. This information is not intended to replace advice given to you by your health care provider. Make sure you discuss any questions you have with your health care provider.

## 2014-12-19 NOTE — Assessment & Plan Note (Signed)
Continue with current prescription therapy as reflected on the Med list.  

## 2014-12-19 NOTE — Assessment & Plan Note (Signed)
Here for medicare wellness/physical  Diet: heart healthy  Physical activity: not sedentary  Depression/mood screen: negative  Hearing: intact to whispered voice  Visual acuity: grossly normal, performs annual eye exam  ADLs: capable  Fall risk: none  Home safety: good  Cognitive evaluation: intact to orientation, naming, recall and repetition  EOL planning: adv directives, full code/ I agree  I have personally reviewed and have noted  1. The patient's medical and social history  2. Their use of alcohol, tobacco or illicit drugs  3. Their current medications and supplements  4. The patient's functional ability including ADL's, fall risks, home safety risks and hearing or visual impairment.  5. Diet and physical activities  6. Evidence for depression or mood disorders    Today patient counseled on age appropriate routine health concerns for screening and prevention, each reviewed and up to date or declined. Immunizations reviewed and up to date or declined. Labs ordered and reviewed. Risk factors for depression reviewed and negative. Hearing function and visual acuity are intact. ADLs screened and addressed as needed. Functional ability and level of safety reviewed and appropriate. Education, counseling and referrals performed based on assessed risks today. Patient provided with a copy of personalized plan for preventive services.    Refused rectal exam, colonoscopy Labs

## 2014-12-19 NOTE — Progress Notes (Signed)
Pre visit review using our clinic review tool, if applicable. No additional management support is needed unless otherwise documented below in the visit note. 

## 2014-12-19 NOTE — Assessment & Plan Note (Signed)
Prilosec prn

## 2014-12-19 NOTE — Progress Notes (Signed)
Subjective:    HPI   The patient is here for a wellness exam. The patient needs to address  chronic hypertension that has been well controlled with diet; to address chronic  hyperlipidemia controlled with Crestor; and to address CAD, controlled with medical treatment and diet. C/o moles .   F/u abd bloating with meals, pain - worse after spicy foods; epig pain. Better when drinking less coffee   BP Readings from Last 3 Encounters:  12/19/14 120/74  08/22/14 128/82  12/08/13 136/84   Wt Readings from Last 3 Encounters:  12/19/14 169 lb (76.658 kg)  08/22/14 166 lb (75.297 kg)  12/08/13 165 lb (74.844 kg)       Review of Systems  Constitutional: Negative for appetite change, fatigue and unexpected weight change.  HENT: Negative for congestion, nosebleeds, sneezing and trouble swallowing.   Eyes: Negative for itching and visual disturbance.  Cardiovascular: Negative for chest pain, palpitations and leg swelling.  Gastrointestinal: Negative for diarrhea, blood in stool and abdominal distention.  Genitourinary: Negative for frequency and hematuria.  Musculoskeletal: Negative for joint swelling and gait problem.  Skin: Negative for rash.  Neurological: Negative for tremors and speech difficulty.  Psychiatric/Behavioral: Negative for sleep disturbance, dysphoric mood and agitation. The patient is not nervous/anxious.        Objective:   Physical Exam  Constitutional: He is oriented to person, place, and time. He appears well-developed.  HENT:  Mouth/Throat: Oropharynx is clear and moist.  Eyes: Conjunctivae are normal. Pupils are equal, round, and reactive to light.  Neck: Normal range of motion. No JVD present. No thyromegaly present.  Cardiovascular: Normal rate, regular rhythm, normal heart sounds and intact distal pulses.  Exam reveals no gallop and no friction rub.   No murmur heard. Pulmonary/Chest: Breath sounds normal. No respiratory distress. He has no wheezes.  He has no rales. He exhibits no tenderness.  Abdominal: Soft. Bowel sounds are normal. He exhibits no distension and no mass. There is no tenderness. There is no rebound and no guarding.  Musculoskeletal: Normal range of motion. He exhibits no edema or tenderness.  Lymphadenopathy:    He has no cervical adenopathy.  Neurological: He is alert and oriented to person, place, and time. He has normal reflexes. No cranial nerve deficit. He exhibits normal muscle tone. Coordination normal.  Skin: Skin is warm and dry. No rash noted.  Psychiatric: He has a normal mood and affect. His behavior is normal. Judgment and thought content normal.  Pt refused rectal exam Moles on face  Lab Results  Component Value Date   WBC 5.9 02/19/2012   HGB 13.0 02/19/2012   HCT 38.1* 02/19/2012   PLT 216 02/19/2012   GLUCOSE 116* 02/19/2012   CHOL 206* 02/19/2012   TRIG 275* 02/19/2012   HDL 40 02/19/2012   LDLDIRECT 113.2 08/19/2011   LDLCALC 111* 02/19/2012   ALT 34 08/19/2011   AST 24 08/19/2011   NA 140 02/19/2012   K 4.2 02/19/2012   CL 108 02/19/2012   CREATININE 1.23 02/19/2012   BUN 20 02/19/2012   CO2 24 02/19/2012   TSH 2.213 02/18/2012   PSA 1.85 10/18/2007   INR 0.97 02/19/2012   HGBA1C * 04/02/2010    6.4 (NOTE)  According to the ADA Clinical Practice Recommendations for 2011, when HbA1c is used as a screening test:   >=6.5%   Diagnostic of Diabetes Mellitus           (if abnormal result  is confirmed)  5.7-6.4%   Increased risk of developing Diabetes Mellitus  References:Diagnosis and Classification of Diabetes Mellitus,Diabetes JGGE,3662,94(TMLYY 1):S62-S69 and Standards of Medical Care in         Diabetes - 2011,Diabetes TKPT,4656,81  (Suppl 1):S11-S61.          Assessment & Plan:

## 2014-12-19 NOTE — Assessment & Plan Note (Signed)
Derm ref Dr Tafeen 

## 2014-12-24 ENCOUNTER — Other Ambulatory Visit (INDEPENDENT_AMBULATORY_CARE_PROVIDER_SITE_OTHER): Payer: Commercial Managed Care - HMO

## 2014-12-24 DIAGNOSIS — E785 Hyperlipidemia, unspecified: Secondary | ICD-10-CM

## 2014-12-24 DIAGNOSIS — Z125 Encounter for screening for malignant neoplasm of prostate: Secondary | ICD-10-CM | POA: Diagnosis not present

## 2014-12-24 DIAGNOSIS — Z0000 Encounter for general adult medical examination without abnormal findings: Secondary | ICD-10-CM

## 2014-12-24 DIAGNOSIS — N320 Bladder-neck obstruction: Secondary | ICD-10-CM

## 2014-12-24 LAB — CBC WITH DIFFERENTIAL/PLATELET
Basophils Absolute: 0 10*3/uL (ref 0.0–0.1)
Basophils Relative: 0.5 % (ref 0.0–3.0)
Eosinophils Absolute: 0.5 10*3/uL (ref 0.0–0.7)
Eosinophils Relative: 8 % — ABNORMAL HIGH (ref 0.0–5.0)
HCT: 41.8 % (ref 39.0–52.0)
Hemoglobin: 14.2 g/dL (ref 13.0–17.0)
Lymphocytes Relative: 17.7 % (ref 12.0–46.0)
Lymphs Abs: 1.1 10*3/uL (ref 0.7–4.0)
MCHC: 33.9 g/dL (ref 30.0–36.0)
MCV: 87.1 fl (ref 78.0–100.0)
Monocytes Absolute: 0.4 10*3/uL (ref 0.1–1.0)
Monocytes Relative: 6.6 % (ref 3.0–12.0)
Neutro Abs: 4.2 10*3/uL (ref 1.4–7.7)
Neutrophils Relative %: 67.2 % (ref 43.0–77.0)
Platelets: 227 10*3/uL (ref 150.0–400.0)
RBC: 4.8 Mil/uL (ref 4.22–5.81)
RDW: 14.3 % (ref 11.5–15.5)
WBC: 6.3 10*3/uL (ref 4.0–10.5)

## 2014-12-24 LAB — URINALYSIS
Bilirubin Urine: NEGATIVE
Ketones, ur: NEGATIVE
Leukocytes, UA: NEGATIVE
Nitrite: NEGATIVE
Specific Gravity, Urine: 1.02 (ref 1.000–1.030)
Total Protein, Urine: NEGATIVE
Urine Glucose: NEGATIVE
Urobilinogen, UA: 0.2 (ref 0.0–1.0)
pH: 6 (ref 5.0–8.0)

## 2014-12-24 LAB — TSH: TSH: 4.57 u[IU]/mL — ABNORMAL HIGH (ref 0.35–4.50)

## 2014-12-24 LAB — LIPID PANEL
Cholesterol: 183 mg/dL (ref 0–200)
HDL: 47.7 mg/dL (ref >39.00)
LDL Cholesterol: 113 mg/dL — ABNORMAL HIGH (ref 0–99)
NonHDL: 135.3
Total CHOL/HDL Ratio: 4
Triglycerides: 111 mg/dL (ref 0.0–149.0)
VLDL: 22.2 mg/dL (ref 0.0–40.0)

## 2014-12-24 LAB — BASIC METABOLIC PANEL
BUN: 27 mg/dL — ABNORMAL HIGH (ref 6–23)
CO2: 24 mEq/L (ref 19–32)
Calcium: 9.1 mg/dL (ref 8.4–10.5)
Chloride: 107 mEq/L (ref 96–112)
Creatinine, Ser: 1.3 mg/dL (ref 0.40–1.50)
GFR: 58.17 mL/min — ABNORMAL LOW (ref >60.00)
Glucose, Bld: 149 mg/dL — ABNORMAL HIGH (ref 70–99)
Potassium: 4.3 mEq/L (ref 3.5–5.1)
Sodium: 141 mEq/L (ref 135–145)

## 2014-12-24 LAB — HEPATIC FUNCTION PANEL
ALT: 27 U/L (ref 0–53)
AST: 15 U/L (ref 0–37)
Albumin: 4.1 g/dL (ref 3.5–5.2)
Alkaline Phosphatase: 69 U/L (ref 39–117)
Bilirubin, Direct: 0.1 mg/dL (ref 0.0–0.3)
Total Bilirubin: 0.3 mg/dL (ref 0.2–1.2)
Total Protein: 6.5 g/dL (ref 6.0–8.3)

## 2014-12-24 LAB — PSA: PSA: 3.69 ng/mL (ref 0.10–4.00)

## 2014-12-28 ENCOUNTER — Other Ambulatory Visit: Payer: Self-pay | Admitting: *Deleted

## 2014-12-28 DIAGNOSIS — R972 Elevated prostate specific antigen [PSA]: Secondary | ICD-10-CM

## 2014-12-28 DIAGNOSIS — R739 Hyperglycemia, unspecified: Secondary | ICD-10-CM

## 2014-12-31 ENCOUNTER — Other Ambulatory Visit (HOSPITAL_COMMUNITY): Payer: Self-pay | Admitting: Cardiology

## 2014-12-31 DIAGNOSIS — I739 Peripheral vascular disease, unspecified: Secondary | ICD-10-CM

## 2015-01-08 ENCOUNTER — Ambulatory Visit (HOSPITAL_COMMUNITY): Payer: Commercial Managed Care - HMO | Attending: Cardiology | Admitting: *Deleted

## 2015-01-08 DIAGNOSIS — E785 Hyperlipidemia, unspecified: Secondary | ICD-10-CM | POA: Diagnosis not present

## 2015-01-08 DIAGNOSIS — I2510 Atherosclerotic heart disease of native coronary artery without angina pectoris: Secondary | ICD-10-CM | POA: Diagnosis not present

## 2015-01-08 DIAGNOSIS — Z87891 Personal history of nicotine dependence: Secondary | ICD-10-CM | POA: Insufficient documentation

## 2015-01-08 DIAGNOSIS — I739 Peripheral vascular disease, unspecified: Secondary | ICD-10-CM | POA: Diagnosis not present

## 2015-01-08 DIAGNOSIS — I10 Essential (primary) hypertension: Secondary | ICD-10-CM | POA: Insufficient documentation

## 2015-01-08 NOTE — Progress Notes (Signed)
Left Lower Extremity Arterial Duplex + ABI

## 2015-02-11 ENCOUNTER — Encounter: Payer: Self-pay | Admitting: Cardiovascular Disease

## 2015-02-11 ENCOUNTER — Ambulatory Visit (INDEPENDENT_AMBULATORY_CARE_PROVIDER_SITE_OTHER): Payer: Commercial Managed Care - HMO | Admitting: Cardiovascular Disease

## 2015-02-11 VITALS — BP 118/76 | HR 71 | Ht 65.0 in | Wt 169.8 lb

## 2015-02-11 DIAGNOSIS — E785 Hyperlipidemia, unspecified: Secondary | ICD-10-CM | POA: Diagnosis not present

## 2015-02-11 DIAGNOSIS — I2510 Atherosclerotic heart disease of native coronary artery without angina pectoris: Secondary | ICD-10-CM

## 2015-02-11 NOTE — Progress Notes (Signed)
Cardiology Office Note   Date:  02/11/2015   ID:  Harold Waters, DOB 1946-10-18, MRN [id number]  PCP:  Walker Kehr, MD  Cardiologist:  [name], MD    Chief Complaint  Patient presents with  . Appointment    ROV     History of Present Illness: Harold Waters is a 69 y.o. male who presents for follow-up of coronary and peripheral arterial disease. The patient has undergone past PCI procedures involving a nondominant right coronary artery as well as his left circumflex. He also has had peripheral arterial disease with intermittent claudication.  The patient is doing okay. He does have chest discomfort when he walks up a hill. This symptom is long-standing. His chest pain resolved when he slows down or stops. Symptoms always resolved within just a few minutes. He has no symptoms with normal activities such as walking on level ground or doing work with his hands. He denies any leg claudication symptoms.  His biggest problem is abdominal bloating. He feels bloated after every meal. This gives him some shortness of breath as well. He has tried omeprazole without any clinical benefit.   Past Medical History  Diagnosis Date  . Essential hypertension, benign   . Other and unspecified hyperlipidemia   . Coronary atherosclerosis of unspecified type of vessel, native or graft   . GERD (gastroesophageal reflux disease)   . Lower back pain   . Osteoarthritis   . Angina   . Myocardial infarction 1997  . History of bronchitis   . Pneumonia 1957  . Sleep apnea   . PAD (peripheral artery disease)     with ABI's 0.8 on the right and 0.86 on the left  . DVT of lower extremity (deep venous thrombosis) ~ 2010    LLE  . Shortness of breath     "lying down"  . Headache(784.0) 02/18/12    "lately"  . PSVT (paroxysmal supraventricular tachycardia) 02/18/12  . Diverticulosis   . Erosive gastritis     Past Surgical History  Procedure Laterality Date  . Arthroscopy knee w/ drilling  ~  2001    right  . Appendectomy      hx of  . Coronary angioplasty with stent placement  1997    "2"  . Peripheral arterial stent graft  09/2010    LLE  . Left heart catheterization with coronary angiogram N/A 02/19/2012    Procedure: LEFT HEART CATHETERIZATION WITH CORONARY ANGIOGRAM;  Surgeon: Larey Dresser, MD;  Location: [facility] CATH LAB;  Service: Cardiovascular;  Laterality: N/A;    Current Outpatient Prescriptions  Medication Sig Dispense Refill  . aspirin 81 MG tablet Take 81 mg by mouth daily.      . Multiple Vitamins-Minerals (MENS MULTIVITAMIN PLUS) TABS Take 1 each by mouth daily.    . nitroGLYCERIN (NITROSTAT) 0.4 MG SL tablet Place 0.4 mg under the tongue every 5 (five) minutes as needed. For chest pain    . omeprazole (PRILOSEC) 20 MG capsule Take 1 capsule (20 mg total) by mouth daily. 30 capsule 3  . rosuvastatin (CRESTOR) 40 MG tablet Take one tablet by mouth at bedtime 90 tablet 1   No current facility-administered medications for this visit.    Allergies:   Eggs or egg-derived products   Social History:  The patient  reports that he quit smoking about 4 years ago. His smoking use included Cigarettes. He has a 40 pack-year smoking history. He has never used smokeless tobacco. He reports that  he drinks alcohol. He reports that he does not use illicit drugs.   Family History:  The patient's  family history includes Coronary artery disease in his brother.    ROS:  Please see the history of present illness.  Otherwise, review of systems is positive for cough, visual disturbance, abdominal pain, wheezing.  All other systems are reviewed and negative.    PHYSICAL EXAM: VS:  BP 118/76 mmHg  Pulse 71  Ht 5\' 5"  (1.651 m)  Wt 169 lb 12.8 oz (77.021 kg)  BMI 28.26 kg/m2 , BMI Body mass index is 28.26 kg/(m^2). GEN: Well nourished, well developed, in no acute distress HEENT: normal Neck: no JVD, no masses. No carotid bruits Cardiac: RRR without murmur or gallop                 Respiratory:  clear to auscultation bilaterally, normal work of breathing GI: soft, nontender, nondistended, + BS MS: no deformity or atrophy Ext: no pretibial edema, pedal pulses 2+= bilaterally Skin: warm and dry, no rash Neuro:  Strength and sensation are intact Psych: euthymic mood, full affect  EKG:  EKG is ordered today. The ekg ordered today shows normal sinus rhythm 71 bpm, age-indeterminate inferior infarct. No change from previous tracings.  Recent Labs: 12/24/2014: ALT 27; BUN 27*; Creatinine 1.30; Hemoglobin 14.2; Platelets 227.0; Potassium 4.3; Sodium 141; TSH 4.57*   Lipid Panel     Component Value Date/Time   CHOL 183 12/24/2014 0802   TRIG 111.0 12/24/2014 0802   HDL 47.70 12/24/2014 0802   CHOLHDL 4 12/24/2014 0802   VLDL 22.2 12/24/2014 0802   LDLCALC 113* 12/24/2014 0802   LDLDIRECT 113.2 08/19/2011 0855      Wt Readings from Last 3 Encounters:  02/11/15 169 lb 12.8 oz (77.021 kg)  12/19/14 169 lb (76.658 kg)  08/22/14 166 lb (75.297 kg)    ASSESSMENT AND PLAN:  1. Coronary artery disease, native vessel, with CCS class II angina: Symptoms are stable. He will continue on aspirin for antiplatelet therapy and high-dose Crestor for lipid lowering. We discussed the option of adding an anti-anginal drug, but he is not interested in more medication. He is satisfied with his quality of life and states that he is able to do just about anything he wants without limitation.  2. Hyperlipidemia: The patient takes Crestor 40 mg. He reports good medication compliance. Most recent lipids reviewed as above. He will work on lifestyle modification.  3. Lower extremity peripheral arterial disease: No claudication symptoms at present. He is on aspirin for antiplatelet therapy.   Current medicines are reviewed with the patient today.  The patient does not have concerns regarding medicines.  The following changes have been made:  no change  Labs/ tests ordered today include:    No orders of the defined types were placed in this encounter.   Disposition:   Richardson Landry, MD  02/11/2015 12:11 PM    [facility] [place], [place], [place]  [address] Phone: [phone]; Fax: [phone]

## 2015-02-11 NOTE — Patient Instructions (Signed)
Your physician wants you to follow-up in: 1 YEAR with Dr Cooper.  You will receive a reminder letter in the mail two months in advance. If you don't receive a letter, please call our office to schedule the follow-up appointment.  Your physician recommends that you continue on your current medications as directed. Please refer to the Current Medication list given to you today.  

## 2015-03-03 ENCOUNTER — Other Ambulatory Visit: Payer: Self-pay | Admitting: Cardiovascular Disease

## 2015-03-04 ENCOUNTER — Other Ambulatory Visit: Payer: Self-pay | Admitting: Dermatology

## 2015-03-04 DIAGNOSIS — D485 Neoplasm of uncertain behavior of skin: Secondary | ICD-10-CM | POA: Diagnosis not present

## 2015-03-04 DIAGNOSIS — L821 Other seborrheic keratosis: Secondary | ICD-10-CM | POA: Diagnosis not present

## 2015-03-04 DIAGNOSIS — D239 Other benign neoplasm of skin, unspecified: Secondary | ICD-10-CM | POA: Diagnosis not present

## 2015-03-04 DIAGNOSIS — L820 Inflamed seborrheic keratosis: Secondary | ICD-10-CM | POA: Diagnosis not present

## 2015-03-29 ENCOUNTER — Ambulatory Visit (INDEPENDENT_AMBULATORY_CARE_PROVIDER_SITE_OTHER): Payer: Commercial Managed Care - HMO | Admitting: Internal Medicine

## 2015-03-29 ENCOUNTER — Encounter: Payer: Self-pay | Admitting: Internal Medicine

## 2015-03-29 VITALS — BP 112/68 | HR 74 | Temp 98.0°F | Ht 65.0 in | Wt 170.8 lb

## 2015-03-29 DIAGNOSIS — I10 Essential (primary) hypertension: Secondary | ICD-10-CM | POA: Diagnosis not present

## 2015-03-29 DIAGNOSIS — E785 Hyperlipidemia, unspecified: Secondary | ICD-10-CM | POA: Diagnosis not present

## 2015-03-29 DIAGNOSIS — K219 Gastro-esophageal reflux disease without esophagitis: Secondary | ICD-10-CM | POA: Diagnosis not present

## 2015-03-29 DIAGNOSIS — R1010 Upper abdominal pain, unspecified: Secondary | ICD-10-CM | POA: Diagnosis not present

## 2015-03-29 MED ORDER — PANCRELIPASE (LIP-PROT-AMYL) 36000-114000 UNITS PO CPEP: 36000.0000 [IU] | ORAL_CAPSULE | Freq: Three times a day (TID) | ORAL | Status: DC

## 2015-03-29 NOTE — Progress Notes (Signed)
Pre visit review using our clinic review tool, if applicable. No additional management support is needed unless otherwise documented below in the visit note. 

## 2015-03-29 NOTE — Assessment & Plan Note (Signed)
Chronic  On Crestor 

## 2015-03-29 NOTE — Assessment & Plan Note (Signed)
Chronic  Losartan, Amlodipine

## 2015-03-29 NOTE — Assessment & Plan Note (Addendum)
5/16 epig bloating -- r/o PUD vs other GI ref Try Creon

## 2015-03-29 NOTE — Assessment & Plan Note (Signed)
Better on Nexium Sch OV w/Dr Fuller Plan

## 2015-03-29 NOTE — Progress Notes (Signed)
Subjective:    HPI     The patient needs to address  chronic hypertension that has been well controlled with diet; to address chronic  hyperlipidemia controlled with Crestor; and to address CAD, controlled with medical treatment and diet.   F/u abd bloating with meals, pain - worse after spicy foods; epig pain. Better when drinking less coffee.   BP Readings from Last 3 Encounters:  03/29/15 112/68  02/11/15 118/76  12/19/14 120/74   Wt Readings from Last 3 Encounters:  03/29/15 170 lb 12 oz (77.452 kg)  02/11/15 169 lb 12.8 oz (77.021 kg)  12/19/14 169 lb (76.658 kg)       Review of Systems  Constitutional: Negative for appetite change, fatigue and unexpected weight change.  HENT: Negative for congestion, nosebleeds, sneezing and trouble swallowing.   Eyes: Negative for itching and visual disturbance.  Cardiovascular: Negative for chest pain, palpitations and leg swelling.  Gastrointestinal: Negative for diarrhea, blood in stool and abdominal distention.  Genitourinary: Negative for frequency and hematuria.  Musculoskeletal: Negative for joint swelling and gait problem.  Skin: Negative for rash.  Neurological: Negative for tremors and speech difficulty.  Psychiatric/Behavioral: Negative for sleep disturbance, dysphoric mood and agitation. The patient is not nervous/anxious.        Objective:   Physical Exam  Constitutional: He is oriented to person, place, and time. He appears well-developed.  HENT:  Mouth/Throat: Oropharynx is clear and moist.  Eyes: Conjunctivae are normal. Pupils are equal, round, and reactive to light.  Neck: Normal range of motion. No JVD present. No thyromegaly present.  Cardiovascular: Normal rate, regular rhythm, normal heart sounds and intact distal pulses.  Exam reveals no gallop and no friction rub.   No murmur heard. Pulmonary/Chest: Breath sounds normal. No respiratory distress. He has no wheezes. He has no rales. He exhibits no  tenderness.  Abdominal: Soft. Bowel sounds are normal. He exhibits no distension and no mass. There is no tenderness. There is no rebound and no guarding.  Musculoskeletal: Normal range of motion. He exhibits no edema or tenderness.  Lymphadenopathy:    He has no cervical adenopathy.  Neurological: He is alert and oriented to person, place, and time. He has normal reflexes. No cranial nerve deficit. He exhibits normal muscle tone. Coordination normal.  Skin: Skin is warm and dry. No rash noted.  Psychiatric: He has a normal mood and affect. His behavior is normal. Judgment and thought content normal.  Pt refused rectal exam Moles on face  Lab Results  Component Value Date   WBC 6.3 12/24/2014   HGB 14.2 12/24/2014   HCT 41.8 12/24/2014   PLT 227.0 12/24/2014   GLUCOSE 149* 12/24/2014   CHOL 183 12/24/2014   TRIG 111.0 12/24/2014   HDL 47.70 12/24/2014   LDLDIRECT 113.2 08/19/2011   LDLCALC 113* 12/24/2014   ALT 27 12/24/2014   AST 15 12/24/2014   NA 141 12/24/2014   K 4.3 12/24/2014   CL 107 12/24/2014   CREATININE 1.30 12/24/2014   BUN 27* 12/24/2014   CO2 24 12/24/2014   TSH 4.57* 12/24/2014   PSA 3.69 12/24/2014   INR 0.97 02/19/2012   HGBA1C * 04/02/2010    6.4 (NOTE)  According to the ADA Clinical Practice Recommendations for 2011, when HbA1c is used as a screening test:   >=6.5%   Diagnostic of Diabetes Mellitus           (if abnormal result  is confirmed)  5.7-6.4%   Increased risk of developing Diabetes Mellitus  References:Diagnosis and Classification of Diabetes Mellitus,Diabetes OIBB,0488,89(VQXIH 1):S62-S69 and Standards of Medical Care in         Diabetes - 2011,Diabetes WTUU,8280,03  (Suppl 1):S11-S61.          Assessment & Plan:

## 2015-04-26 ENCOUNTER — Encounter: Payer: Self-pay | Admitting: Gastroenterology

## 2015-07-03 DIAGNOSIS — L255 Unspecified contact dermatitis due to plants, except food: Secondary | ICD-10-CM | POA: Diagnosis not present

## 2015-07-04 ENCOUNTER — Ambulatory Visit: Payer: Commercial Managed Care - HMO | Admitting: Gastroenterology

## 2015-07-31 ENCOUNTER — Encounter: Payer: Self-pay | Admitting: Gastroenterology

## 2015-07-31 ENCOUNTER — Ambulatory Visit (INDEPENDENT_AMBULATORY_CARE_PROVIDER_SITE_OTHER): Payer: Commercial Managed Care - HMO | Admitting: Gastroenterology

## 2015-07-31 VITALS — BP 112/60 | HR 80 | Ht 63.5 in | Wt 163.2 lb

## 2015-07-31 DIAGNOSIS — IMO0001 Reserved for inherently not codable concepts without codable children: Secondary | ICD-10-CM

## 2015-07-31 DIAGNOSIS — R140 Abdominal distension (gaseous): Secondary | ICD-10-CM

## 2015-07-31 DIAGNOSIS — K219 Gastro-esophageal reflux disease without esophagitis: Secondary | ICD-10-CM | POA: Diagnosis not present

## 2015-07-31 DIAGNOSIS — R143 Flatulence: Secondary | ICD-10-CM | POA: Diagnosis not present

## 2015-07-31 MED ORDER — PANTOPRAZOLE SODIUM 40 MG PO TBEC: 40.0000 mg | DELAYED_RELEASE_TABLET | Freq: Every day | ORAL | Status: DC

## 2015-07-31 NOTE — Progress Notes (Signed)
    History of Present Illness: This is a 69 year old male referred by Plotnikov, [name], MD for the evaluation of post prandial gas, bloating, belching. He previously underwent colonoscopy in 12/2007 which showed hyperplastic polyps, internal hemorrhoids and diverticulosis. EGD in 05/1999 showed erosive gastroduodenitis. He tried omeprazole with no change in symptoms. TUMS helps temporarily. Symptoms are worse with larger meals. Denies weight loss, abdominal pain, constipation, diarrhea, change in stool caliber, melena, hematochezia, nausea, vomiting, dysphagia, chest pain.  Review of Systems: Pertinent positive and negative review of systems were noted in the above HPI section. All other review of systems were otherwise negative.  Current Medications, Allergies, Past Medical History, Past Surgical History, Family History and Social History were reviewed in [employer] record.  Physical Exam: General: Well developed, well nourished, no acute distress Head: Normocephalic and atraumatic Eyes:  sclerae anicteric, EOMI Ears: Normal auditory acuity Mouth: No deformity or lesions Neck: Supple, no masses or thyromegaly Lungs: Clear throughout to auscultation Heart: Regular rate and rhythm; no murmurs, rubs or bruits Abdomen: Soft, non tender and non distended. No masses, hepatosplenomegaly or hernias noted. Normal Bowel sounds Musculoskeletal: Symmetrical with no gross deformities  Skin: No lesions on visible extremities Pulses:  Normal pulses noted Extremities: No clubbing, cyanosis, edema or deformities noted Neurological: Alert oriented x 4, grossly nonfocal Cervical Nodes:  No significant cervical adenopathy Inguinal Nodes: No significant inguinal adenopathy Psychological:  Alert and cooperative. Normal mood and affect  Assessment and Recommendations:  1. Post prandial gas, bloating, belching. Suspected GERD, gastritis, or duodenitis. Possible gastroparesis or  partial GOO. Begin prantoprazole 40 mg daily, Gas-X qid, antireflux diet and low gas diet. Schedule EGD. The risks (including bleeding, perforation, infection, missed lesions, medication reactions and possible hospitalization or surgery if complications occur), benefits, and alternatives to endoscopy with possible biopsy and possible dilation were discussed with the patient and they consent to proceed.    cc: Cassandria Anger, MD [address] [place], [place] [address]

## 2015-07-31 NOTE — Patient Instructions (Addendum)
You have been scheduled for an endoscopy. Please follow written instructions given to you at your visit today. If you use inhalers (even only as needed), please bring them with you on the day of your procedure. Your physician has requested that you go to www.startemmi.com and enter the access code given to you at your visit today. This web site gives a general overview about your procedure. However, you should still follow specific instructions given to you by our office regarding your preparation for the procedure.  We have sent the following medications to your pharmacy for you to pick up at your convenience:Protonix.  Patient advised to avoid spicy, acidic, citrus, chocolate, mints, fruit and fruit juices.  Limit the intake of caffeine, alcohol and Soda.  Don't exercise too soon after eating.  Don't lie down within 3-4 hours of eating.  Elevate the head of your bed.   You have been given a Low gas diet.  You can take Gas-X or Mylanta over the counter 2-4 x daily.  Thank you for choosing me and [facility].  Pricilla Riffle. Dagoberto Ligas., MD., [facility]

## 2015-09-23 ENCOUNTER — Encounter: Payer: Self-pay | Admitting: Gastroenterology

## 2015-09-30 ENCOUNTER — Encounter: Payer: Self-pay | Admitting: Gastroenterology

## 2015-09-30 ENCOUNTER — Ambulatory Visit (AMBULATORY_SURGERY_CENTER): Payer: Commercial Managed Care - HMO | Admitting: Gastroenterology

## 2015-09-30 VITALS — BP 118/76 | HR 61 | Temp 97.0°F | Resp 16 | Ht 63.5 in | Wt 163.0 lb

## 2015-09-30 DIAGNOSIS — K2950 Unspecified chronic gastritis without bleeding: Secondary | ICD-10-CM | POA: Diagnosis not present

## 2015-09-30 DIAGNOSIS — K219 Gastro-esophageal reflux disease without esophagitis: Secondary | ICD-10-CM | POA: Diagnosis present

## 2015-09-30 DIAGNOSIS — R140 Abdominal distension (gaseous): Secondary | ICD-10-CM | POA: Diagnosis not present

## 2015-09-30 DIAGNOSIS — E669 Obesity, unspecified: Secondary | ICD-10-CM | POA: Diagnosis not present

## 2015-09-30 DIAGNOSIS — G4733 Obstructive sleep apnea (adult) (pediatric): Secondary | ICD-10-CM | POA: Diagnosis not present

## 2015-09-30 DIAGNOSIS — K2970 Gastritis, unspecified, without bleeding: Secondary | ICD-10-CM | POA: Diagnosis not present

## 2015-09-30 DIAGNOSIS — I2510 Atherosclerotic heart disease of native coronary artery without angina pectoris: Secondary | ICD-10-CM | POA: Diagnosis not present

## 2015-09-30 DIAGNOSIS — K2990 Gastroduodenitis, unspecified, without bleeding: Secondary | ICD-10-CM | POA: Diagnosis not present

## 2015-09-30 DIAGNOSIS — I739 Peripheral vascular disease, unspecified: Secondary | ICD-10-CM | POA: Diagnosis not present

## 2015-09-30 DIAGNOSIS — B9681 Helicobacter pylori [H. pylori] as the cause of diseases classified elsewhere: Secondary | ICD-10-CM | POA: Diagnosis not present

## 2015-09-30 MED ORDER — SODIUM CHLORIDE 0.9 % IV SOLN
500.0000 mL | INTRAVENOUS | Status: DC
Start: 2015-09-30 — End: 2015-09-30

## 2015-09-30 NOTE — Op Note (Signed)
[facility]  [address] [place], [address]   ENDOSCOPY PROCEDURE REPORT  PATIENT: Harold Waters, Harold Waters  MR#: [id number] BIRTHDATE: 01-Sep-1946 , 61  yrs. old GENDER: male ENDOSCOPIST: Ladene Artist, MD, [facility] PROCEDURE DATE:  09/30/2015 PROCEDURE:  EGD w/ biopsy ASA CLASS:     Class II INDICATIONS:  history of esophageal reflux. MEDICATIONS: Monitored anesthesia care and Propofol 120 mg IV TOPICAL ANESTHETIC: none DESCRIPTION OF PROCEDURE: After the risks benefits and alternatives of the procedure were thoroughly explained, informed consent was obtained.  The LB [id number] endoscope was introduced through the mouth and advanced to the second portion of the duodenum , Without limitations.  The instrument was slowly withdrawn as the mucosa was fully examined.    STOMACH: Non erosive gastritis was found in the gastric body. Multiple biopsies were performed.   The stomach otherwise appeared normal. ESOPHAGUS: The mucosa of the esophagus appeared normal. DUODENUM: Mild non erosive duodenitis was found in the duodenal bulb.   The duodenal mucosa showed no abnormalities in the 2nd part of the duodenum.  Retroflexed views revealed no abnormalities.  The scope was then withdrawn from the patient and the procedure completed.  COMPLICATIONS: There were no immediate complications.  ENDOSCOPIC IMPRESSION: 1.   Non erosive gastritis in the gastric body; multiple biopsies performed 2.   Duodenitis in the duodenal bulb  RECOMMENDATIONS: 1.  Await pathology results 2.  Anti-reflux regimen 3.  Continue PPI daily and Gas-X as needed  eSigned:  Ladene Artist, MD, [facility] 09/30/2015 10:12 AM

## 2015-09-30 NOTE — Patient Instructions (Addendum)
Impressions/recommendations:  Gastritis (handout given) Duodenitis Continue Protonix 40 mg daily, take 30 minutes before breakfast.  YOU HAD AN ENDOSCOPIC PROCEDURE TODAY AT [place]:   Refer to the procedure report that was given to you for any specific questions about what was found during the examination.  If the procedure report does not answer your questions, please call your gastroenterologist to clarify.  If you requested that your care partner not be given the details of your procedure findings, then the procedure report has been included in a sealed envelope for you to review at your convenience later.  YOU SHOULD EXPECT: Some feelings of bloating in the abdomen. Passage of more gas than usual.  Walking can help get rid of the air that was put into your GI tract during the procedure and reduce the bloating. If you had a lower endoscopy (such as a colonoscopy or flexible sigmoidoscopy) you may notice spotting of blood in your stool or on the toilet paper. If you underwent a bowel prep for your procedure, you may not have a normal bowel movement for a few days.  Please Note:  You might notice some irritation and congestion in your nose or some drainage.  This is from the oxygen used during your procedure.  There is no need for concern and it should clear up in a day or so.  SYMPTOMS TO REPORT IMMEDIATELY:   Following upper endoscopy (EGD)  Vomiting of blood or coffee ground material  New chest pain or pain under the shoulder blades  Painful or persistently difficult swallowing  New shortness of breath  Fever of 100F or higher  Black, tarry-looking stools  For urgent or emergent issues, a gastroenterologist can be reached at any hour by calling [phone].   DIET: Your first meal following the procedure should be a small meal and then it is ok to progress to your normal diet. Heavy or fried foods are harder to digest and may make you feel nauseous or bloated.   Likewise, meals heavy in dairy and vegetables can increase bloating.  Drink plenty of fluids but you should avoid alcoholic beverages for 24 hours.  ACTIVITY:  You should plan to take it easy for the rest of today and you should NOT DRIVE or use heavy machinery until tomorrow (because of the sedation medicines used during the test).    FOLLOW UP: Our staff will call the number listed on your records the next business day following your procedure to check on you and address any questions or concerns that you may have regarding the information given to you following your procedure. If we do not reach you, we will leave a message.  However, if you are feeling well and you are not experiencing any problems, there is no need to return our call.  We will assume that you have returned to your regular daily activities without incident.  If any biopsies were taken you will be contacted by phone or by letter within the next 1-3 weeks.  Please call us at [phone] if you have not heard about the biopsies in 3 weeks.    SIGNATURES/CONFIDENTIALITY: You and/or your care partner have signed paperwork which will be entered into your electronic medical record.  These signatures attest to the fact that that the information above on your After Visit Summary has been reviewed and is understood.  Full responsibility of the confidentiality of this discharge information lies with you and/or your care-partner.

## 2015-09-30 NOTE — Progress Notes (Signed)
Called to room to assist during endoscopic procedure.  Patient ID and intended procedure confirmed with present staff. Received instructions for my participation in the procedure from the performing physician.  

## 2015-09-30 NOTE — Progress Notes (Signed)
Report to PACU, RN, vss, BBS= Clear.  

## 2015-10-01 ENCOUNTER — Telehealth: Payer: Self-pay | Admitting: *Deleted

## 2015-10-01 NOTE — Telephone Encounter (Signed)
  Follow up Call-  Call back number 09/30/2015  Post procedure Call Back phone  # [phone] cell  Permission to leave phone message Yes    Mailbox full so unable to leave a message

## 2015-10-07 ENCOUNTER — Other Ambulatory Visit: Payer: Self-pay

## 2015-10-07 MED ORDER — PANTOPRAZOLE SODIUM 40 MG PO TBEC: 40.0000 mg | DELAYED_RELEASE_TABLET | Freq: Two times a day (BID) | ORAL | Status: DC

## 2015-10-07 MED ORDER — BIS SUBCIT-METRONID-TETRACYC 140-125-125 MG PO CAPS: 3.0000 | ORAL_CAPSULE | Freq: Three times a day (TID) | ORAL | Status: DC

## 2015-10-17 ENCOUNTER — Telehealth: Payer: Self-pay | Admitting: Gastroenterology

## 2015-10-17 NOTE — Telephone Encounter (Signed)
Informed patient that I got a PA form for [employer] this morning from his insurance company and I filled out the form and faxed it back. Now we are waiting to hear from them whether they approved or denied. Told him I will contact him when I know there answer. Patient verbalized understanding. Pt states to please call him on his cell phone number since he rarely at home.

## 2015-10-21 NOTE — Telephone Encounter (Signed)
Received fax approval from [facility] that [place] is approved until 11/18/2015. Notified patient of approval.

## 2016-01-07 ENCOUNTER — Other Ambulatory Visit: Payer: Self-pay | Admitting: Cardiovascular Disease

## 2016-01-07 DIAGNOSIS — I739 Peripheral vascular disease, unspecified: Secondary | ICD-10-CM

## 2016-01-13 ENCOUNTER — Ambulatory Visit (HOSPITAL_COMMUNITY)
Admission: RE | Admit: 2016-01-13 | Discharge: 2016-01-13 | Disposition: A | Discharge status: Home / Self Care | Payer: Commercial Managed Care - HMO | Source: Ambulatory Visit | Attending: Cardiovascular Disease | Admitting: Cardiovascular Disease

## 2016-01-13 DIAGNOSIS — E785 Hyperlipidemia, unspecified: Secondary | ICD-10-CM | POA: Diagnosis not present

## 2016-01-13 DIAGNOSIS — Y838 Other surgical procedures as the cause of abnormal reaction of the patient, or of later complication, without mention of misadventure at the time of the procedure: Secondary | ICD-10-CM | POA: Diagnosis not present

## 2016-01-13 DIAGNOSIS — I739 Peripheral vascular disease, unspecified: Secondary | ICD-10-CM

## 2016-01-13 DIAGNOSIS — I10 Essential (primary) hypertension: Secondary | ICD-10-CM | POA: Diagnosis not present

## 2016-01-13 DIAGNOSIS — T82856A Stenosis of peripheral vascular stent, initial encounter: Secondary | ICD-10-CM | POA: Insufficient documentation

## 2016-01-13 DIAGNOSIS — R938 Abnormal findings on diagnostic imaging of other specified body structures: Secondary | ICD-10-CM | POA: Diagnosis not present

## 2016-02-24 ENCOUNTER — Ambulatory Visit: Payer: Commercial Managed Care - HMO | Admitting: Cardiovascular Disease

## 2016-03-29 ENCOUNTER — Other Ambulatory Visit: Payer: Self-pay | Admitting: Cardiovascular Disease

## 2016-05-04 ENCOUNTER — Encounter: Payer: Self-pay | Admitting: Cardiovascular Disease

## 2016-05-04 ENCOUNTER — Ambulatory Visit (INDEPENDENT_AMBULATORY_CARE_PROVIDER_SITE_OTHER): Payer: Commercial Managed Care - HMO | Admitting: Cardiovascular Disease

## 2016-05-04 VITALS — BP 126/70 | HR 64 | Ht 65.0 in | Wt 166.0 lb

## 2016-05-04 DIAGNOSIS — E785 Hyperlipidemia, unspecified: Secondary | ICD-10-CM

## 2016-05-04 DIAGNOSIS — I739 Peripheral vascular disease, unspecified: Secondary | ICD-10-CM

## 2016-05-04 DIAGNOSIS — I2510 Atherosclerotic heart disease of native coronary artery without angina pectoris: Secondary | ICD-10-CM

## 2016-05-04 MED ORDER — ASPIRIN EC 81 MG PO TBEC: 81.0000 mg | DELAYED_RELEASE_TABLET | Freq: Every day | ORAL | Status: DC

## 2016-05-04 MED ORDER — NITROGLYCERIN 0.4 MG SL SUBL: 0.4000 mg | SUBLINGUAL_TABLET | SUBLINGUAL | Status: DC | PRN

## 2016-05-04 NOTE — Patient Instructions (Signed)
Medication Instructions:  Your physician has recommended you make the following change in your medication:  1. START Aspirin 81mg  take one tablet by mouth daily  Labwork: No new orders.   Testing/Procedures: Your physician has requested that you have a lower extremity arterial duplex in March 2018. This test is an ultrasound of the arteries in the legs. It looks at arterial blood flow in the legs. Allow one hour for Lower Arterial scans. There are no restrictions or special instructions  Follow-Up: Your physician wants you to follow-up in: 1 YEAR with Dr [name].  You will receive a reminder letter in the mail two months in advance. If you don't receive a letter, please call our office to schedule the follow-up appointment.   Any Other Special Instructions Will Be Listed Below (If Applicable).     If you need a refill on your cardiac medications before your next appointment, please call your pharmacy.

## 2016-05-04 NOTE — Progress Notes (Signed)
Cardiology Office Note Date:  05/04/2016   ID:  Harold Waters, DOB 01-18-1946, MRN [id number]  PCP:  Walker Kehr, MD  Cardiologist:  [name], MD    Chief Complaint  Patient presents with  . Coronary Artery Disease   History of Present Illness: Harold Waters is a 70 y.o. male who presents for coronary and peripheral arterial disease. The patient has undergone past PCI procedures involving a nondominant right coronary artery as well as his left circumflex. He also has had peripheral arterial disease with intermittent claudication.  The patient is doing fairly well from a cardiac perspective. He had vascular studies performed in March demonstrating normal ABIs of 1.0 on the right and 0.96 on the left. There was moderate in-stent restenosis in the left SFA stent. The patient reports minimal claudication symptoms. States that he has very rare discomfort in his left calf with heavy exertion. Otherwise no symptoms with normal activities. He has not had any recent chest discomfort, although in the past he has reported chest pressure when climbing a hill. States he has been limited by left hip pain. No shortness of breath, except when he experiences abdominal bloating. He has had abdominal bloating now for some time and has discontinued aspirin. This has not helped her symptoms.  Past Medical History  Diagnosis Date  . Essential hypertension, benign   . Other and unspecified hyperlipidemia   . Coronary atherosclerosis of unspecified type of vessel, native or graft   . GERD (gastroesophageal reflux disease)   . Lower back pain   . Osteoarthritis   . Angina   . Myocardial infarction ([place]) 1997  . History of bronchitis   . Pneumonia 1957  . PAD (peripheral artery disease) ([facility])     with ABI's 0.8 on the right and 0.86 on the left  . DVT of lower extremity (deep venous thrombosis) ([place]) ~ 2010    LLE  . Shortness of breath     "lying down"  . Headache(784.0) 02/18/12    "lately"    . PSVT (paroxysmal supraventricular tachycardia) ([place]) 02/18/12  . Diverticulosis   . Erosive gastritis   . Diverticulosis   . Internal hemorrhoids   . Sleep apnea     does not wear c-pap    Past Surgical History  Procedure Laterality Date  . Arthroscopy knee w/ drilling  ~ 2001    right  . Appendectomy      hx of  . Coronary angioplasty with stent placement  1997    "2"  . Peripheral arterial stent graft  09/2010    LLE  . Left heart catheterization with coronary angiogram N/A 02/19/2012    Procedure: LEFT HEART CATHETERIZATION WITH CORONARY ANGIOGRAM;  Surgeon: Larey Dresser, MD;  Location: [facility] CATH LAB;  Service: Cardiovascular;  Laterality: N/A;  . Colonoscopy    . Upper gastrointestinal endoscopy      Current Outpatient Prescriptions  Medication Sig Dispense Refill  . lipase/protease/amylase (CREON) 36000 UNITS CPEP capsule Take 1 capsule (36,000 Units total) by mouth 3 (three) times daily before meals. 90 capsule 3  . Multiple Vitamins-Minerals (MENS MULTIVITAMIN PLUS) TABS Take 1 each by mouth daily.    . nitroGLYCERIN (NITROSTAT) 0.4 MG SL tablet Place 1 tablet (0.4 mg total) under the tongue every 5 (five) minutes as needed. For chest pain 25 tablet 3  . rosuvastatin (CRESTOR) 40 MG tablet TAKE 1 TABLET BY MOUTH AT BEDTIME 60 tablet 0  . aspirin EC 81 MG tablet  Take 1 tablet (81 mg total) by mouth daily.     No current facility-administered medications for this visit.    Allergies:   Eggs or egg-derived products   Social History:  The patient  reports that he quit smoking about 6 years ago. His smoking use included Cigarettes. He has a 40 pack-year smoking history. He has never used smokeless tobacco. He reports that he drinks alcohol. He reports that he does not use illicit drugs.   Family History:  The patient's family history includes Coronary artery disease in his brother; Ulcers in his father. There is no history of Colon cancer, Esophageal cancer, Rectal  cancer, or Stomach cancer.    ROS:  Please see the history of present illness.  Otherwise, review of systems is positive for abdominal bloating.  All other systems are reviewed and negative.    PHYSICAL EXAM: VS:  BP 126/70 mmHg  Pulse 64  Ht 5\' 5"  (1.651 m)  Wt 166 lb (75.297 kg)  BMI 27.62 kg/m2 , BMI Body mass index is 27.62 kg/(m^2). GEN: Well nourished, well developed, in no acute distress HEENT: normal Neck: no JVD, no masses. No carotid bruits Cardiac: RRR with 2/6 systolic murmur at the RUSB           Respiratory:  clear to auscultation bilaterally, normal work of breathing GI: soft, nontender, nondistended, + BS MS: no deformity or atrophy Ext: trace bilateral pretibial edema Skin: warm and dry, no rash Neuro:  Strength and sensation are intact Psych: euthymic mood, full affect  EKG:  EKG is ordered today. The ekg ordered today shows NSR 65 bpm, within normal limits  Recent Labs: No results found for requested labs within last 365 days.   Lipid Panel     Component Value Date/Time   CHOL 183 12/24/2014 0802   TRIG 111.0 12/24/2014 0802   HDL 47.70 12/24/2014 0802   CHOLHDL 4 12/24/2014 0802   VLDL 22.2 12/24/2014 0802   LDLCALC 113* 12/24/2014 0802   LDLDIRECT 113.2 08/19/2011 0855      Wt Readings from Last 3 Encounters:  05/04/16 166 lb (75.297 kg)  09/30/15 163 lb (73.936 kg)  07/31/15 163 lb 4 oz (74.05 kg)    Cardiac Studies Reviewed: Vascular Duplex 01-13-2016: ABI 1.0 on the right, 0.96 on the left Heterogeneous plaque throughout the left leg. 50-74% mid SFA within stent restenosis. See Arterial Doppler report. Suggest PV consult if clinically indicated. F/u 1 year.  ASSESSMENT AND PLAN: 1.  CAD, native vessel, without angina: The patient reports no change in symptoms with minimal problems since last year. We discussed addition of antianginal medicines last year, but he declined. He really hasn't had any problems in the interim. I did ask him to  start back on aspirin 81 mg daily. He continues on a high intensity statin with Crestor 40 mg daily.  2. Lower extremity peripheral arterial disease with intermittent claudication: Stable symptoms with no limitation during regular activities. Repeat Doppler studies next March. Resume aspirin.  3. Hyperlipidemia: Advised to schedule his yearly visit with Dr. Alain Marion who has followed his labs. He continues on Crestor 40 mg daily.  Current medicines are reviewed with the patient today.  The patient does not have concerns regarding medicines.  Labs/ tests ordered today include:   Orders Placed This Encounter  Procedures  . EKG 12-Lead    Disposition:   FU one year, ABI/lower extremity doppler prior to office visit  Signed, [name], MD  05/04/2016 10:45  AM    [facility] [address], [place], [place]  [address] Phone: [phone]; Fax: [phone]

## 2016-06-22 ENCOUNTER — Ambulatory Visit (INDEPENDENT_AMBULATORY_CARE_PROVIDER_SITE_OTHER): Payer: Commercial Managed Care - HMO | Admitting: Internal Medicine

## 2016-06-22 ENCOUNTER — Encounter: Payer: Self-pay | Admitting: Internal Medicine

## 2016-06-22 VITALS — BP 122/70 | HR 70 | Ht 65.0 in | Wt 163.0 lb

## 2016-06-22 DIAGNOSIS — Z0000 Encounter for general adult medical examination without abnormal findings: Secondary | ICD-10-CM

## 2016-06-22 DIAGNOSIS — M25511 Pain in right shoulder: Secondary | ICD-10-CM | POA: Diagnosis not present

## 2016-06-22 DIAGNOSIS — Z0001 Encounter for general adult medical examination with abnormal findings: Secondary | ICD-10-CM

## 2016-06-22 DIAGNOSIS — I2510 Atherosclerotic heart disease of native coronary artery without angina pectoris: Secondary | ICD-10-CM

## 2016-06-22 DIAGNOSIS — E118 Type 2 diabetes mellitus with unspecified complications: Secondary | ICD-10-CM

## 2016-06-22 DIAGNOSIS — E785 Hyperlipidemia, unspecified: Secondary | ICD-10-CM

## 2016-06-22 DIAGNOSIS — R1010 Upper abdominal pain, unspecified: Secondary | ICD-10-CM | POA: Diagnosis not present

## 2016-06-22 DIAGNOSIS — M25519 Pain in unspecified shoulder: Secondary | ICD-10-CM | POA: Insufficient documentation

## 2016-06-22 DIAGNOSIS — Z23 Encounter for immunization: Secondary | ICD-10-CM | POA: Diagnosis not present

## 2016-06-22 DIAGNOSIS — N320 Bladder-neck obstruction: Secondary | ICD-10-CM

## 2016-06-22 HISTORY — DX: Type 2 diabetes mellitus with unspecified complications: E11.8

## 2016-06-22 HISTORY — DX: Pain in unspecified shoulder: M25.519

## 2016-06-22 MED ORDER — VITAMIN D3 50 MCG (2000 UT) PO CAPS: 2000.0000 [IU] | ORAL_CAPSULE | Freq: Every day | ORAL | 3 refills | Status: DC

## 2016-06-22 MED ORDER — TRAMADOL HCL 50 MG PO TABS: 50.0000 mg | ORAL_TABLET | Freq: Two times a day (BID) | ORAL | 3 refills | Status: DC | PRN

## 2016-06-22 MED ORDER — ROSUVASTATIN CALCIUM 40 MG PO TABS: 40.0000 mg | ORAL_TABLET | Freq: Every day | ORAL | 3 refills | Status: DC

## 2016-06-22 NOTE — Assessment & Plan Note (Signed)
Restart crestor - d/c if pain relapsed

## 2016-06-22 NOTE — Assessment & Plan Note (Signed)
Chronic R shoulder pain H/o dislocation ROM exercises Tramadol prn  Potential benefits of a long term opioids use as well as potential risks (i.e. addiction risk, apnea etc) and complications (i.e. Somnolence, constipation and others) were explained to the patient and were aknowledged.

## 2016-06-22 NOTE — Addendum Note (Signed)
Addended by: Cresenciano Lick on: 06/22/2016 11:42 AM   Modules accepted: Orders

## 2016-06-22 NOTE — Progress Notes (Signed)
Subjective:  Patient ID: Harold Waters, male    DOB: 05/12/1946  Age: 70 y.o. MRN: [id number]  CC: No chief complaint on file.   HPI Kane Fetterhoff Fackler presents for well exam C/o insomnia C/o chronic R shoulder pain - worse  Outpatient Medications Prior to Visit  Medication Sig Dispense Refill  . aspirin EC 81 MG tablet Take 1 tablet (81 mg total) by mouth daily.    . lipase/protease/amylase (CREON) 36000 UNITS CPEP capsule Take 1 capsule (36,000 Units total) by mouth 3 (three) times daily before meals. 90 capsule 3  . Multiple Vitamins-Minerals (MENS MULTIVITAMIN PLUS) TABS Take 1 each by mouth daily.    . nitroGLYCERIN (NITROSTAT) 0.4 MG SL tablet Place 1 tablet (0.4 mg total) under the tongue every 5 (five) minutes as needed. For chest pain (Patient not taking: Reported on 06/22/2016) 25 tablet 3  . rosuvastatin (CRESTOR) 40 MG tablet TAKE 1 TABLET BY MOUTH AT BEDTIME (Patient not taking: Reported on 06/22/2016) 60 tablet 0   No facility-administered medications prior to visit.     ROS Review of Systems  Constitutional: Negative for appetite change, fatigue and unexpected weight change.  HENT: Negative for congestion, nosebleeds, sneezing, sore throat and trouble swallowing.   Eyes: Negative for itching and visual disturbance.  Respiratory: Negative for cough.   Cardiovascular: Negative for chest pain, palpitations and leg swelling.  Gastrointestinal: Negative for abdominal distention, blood in stool, diarrhea and nausea.  Genitourinary: Negative for frequency and hematuria.  Musculoskeletal: Positive for arthralgias. Negative for back pain, gait problem, joint swelling and neck pain.  Skin: Negative for rash.  Neurological: Negative for dizziness, tremors, speech difficulty and weakness.  Psychiatric/Behavioral: Positive for sleep disturbance. Negative for agitation and dysphoric mood. The patient is not nervous/anxious.     Objective:  BP 122/70   Pulse 70   Ht 5\' 5"  (1.651  m)   Wt 163 lb (73.9 kg)   SpO2 95%   BMI 27.12 kg/m   BP Readings from Last 3 Encounters:  06/22/16 122/70  05/04/16 126/70  09/30/15 118/76    Wt Readings from Last 3 Encounters:  06/22/16 163 lb (73.9 kg)  05/04/16 166 lb (75.3 kg)  09/30/15 163 lb (73.9 kg)    Physical Exam  Constitutional: He is oriented to person, place, and time. He appears well-developed and well-nourished. No distress.  HENT:  Head: Normocephalic and atraumatic.  Right Ear: External ear normal.  Left Ear: External ear normal.  Nose: Nose normal.  Mouth/Throat: Oropharynx is clear and moist. No oropharyngeal exudate.  Eyes: Conjunctivae and EOM are normal. Pupils are equal, round, and reactive to light. Right eye exhibits no discharge. Left eye exhibits no discharge. No scleral icterus.  Neck: Normal range of motion. Neck supple. No JVD present. No tracheal deviation present. No thyromegaly present.  Cardiovascular: Normal rate, regular rhythm, normal heart sounds and intact distal pulses.  Exam reveals no gallop and no friction rub.   No murmur heard. Pulmonary/Chest: Effort normal and breath sounds normal. No stridor. No respiratory distress. He has no wheezes. He has no rales. He exhibits no tenderness.  Abdominal: Soft. Bowel sounds are normal. He exhibits no distension and no mass. There is no tenderness. There is no rebound and no guarding.  Musculoskeletal: Normal range of motion. He exhibits tenderness. He exhibits no edema.  Lymphadenopathy:    He has no cervical adenopathy.  Neurological: He is alert and oriented to person, place, and time. He has normal  reflexes. No cranial nerve deficit. He exhibits normal muscle tone. Coordination normal.  Skin: Skin is warm and dry. No rash noted. He is not diaphoretic. No erythema. No pallor.  Psychiatric: He has a normal mood and affect. His behavior is normal. Judgment and thought content normal.  R shoulder is tender w/ROM Pt declined rectal  Lab  Results  Component Value Date   WBC 6.3 12/24/2014   HGB 14.2 12/24/2014   HCT 41.8 12/24/2014   PLT 227.0 12/24/2014   GLUCOSE 149 (H) 12/24/2014   CHOL 183 12/24/2014   TRIG 111.0 12/24/2014   HDL 47.70 12/24/2014   LDLDIRECT 113.2 08/19/2011   LDLCALC 113 (H) 12/24/2014   ALT 27 12/24/2014   AST 15 12/24/2014   NA 141 12/24/2014   K 4.3 12/24/2014   CL 107 12/24/2014   CREATININE 1.30 12/24/2014   BUN 27 (H) 12/24/2014   CO2 24 12/24/2014   TSH 4.57 (H) 12/24/2014   PSA 3.69 12/24/2014   INR 0.97 02/19/2012   HGBA1C (H) 04/02/2010    6.4 (NOTE)                                                                       According to the ADA Clinical Practice Recommendations for 2011, when HbA1c is used as a screening test:   >=6.5%   Diagnostic of Diabetes Mellitus           (if abnormal result  is confirmed)  5.7-6.4%   Increased risk of developing Diabetes Mellitus  References:Diagnosis and Classification of Diabetes Mellitus,Diabetes [id number] 1):S62-S69 and Standards of Medical Care in         Diabetes - 2011,Diabetes Care,2011,34  (Suppl 1):S11-S61.    No results found.  Assessment & Plan:   There are no diagnoses linked to this encounter. I am having Mr. Ursin maintain his MENS MULTIVITAMIN PLUS, lipase/protease/amylase, rosuvastatin, nitroGLYCERIN, and aspirin EC.  No orders of the defined types were placed in this encounter.    Follow-up: No Follow-up on file.  Walker Kehr, MD

## 2016-06-22 NOTE — Assessment & Plan Note (Signed)
Restart Crestor  

## 2016-06-22 NOTE — Progress Notes (Signed)
Pre visit review using our clinic review tool, if applicable. No additional management support is needed unless otherwise documented below in the visit note. 

## 2016-06-22 NOTE — Assessment & Plan Note (Addendum)
Here for medicare wellness/physical  Diet: heart healthy  Physical activity: not sedentary  Depression/mood screen: negative  Hearing: intact to whispered voice  Visual acuity: grossly normal, performs annual eye exam  ADLs: capable  Fall risk: none  Home safety: good  Cognitive evaluation: intact to orientation, naming, recall and repetition  EOL planning: adv directives, full code/ I agree  I have personally reviewed and have noted  1. The patient's medical and social history  2. Their use of alcohol, tobacco or illicit drugs  3. Their current medications and supplements  4. The patient's functional ability including ADL's, fall risks, home safety risks and hearing or visual impairment.  5. Diet and physical activities  6. Evidence for depression or mood disorders    Today patient counseled on age appropriate routine health concerns for screening and prevention, each reviewed and up to date or declined. Immunizations reviewed and up to date or declined. Labs ordered and reviewed. Risk factors for depression reviewed and negative. Hearing function and visual acuity are intact. ADLs screened and addressed as needed. Functional ability and level of safety reviewed and appropriate. Education, counseling and referrals performed based on assessed risks today. Patient provided with a copy of personalized plan for preventive services.    Refused rectal exam, colonoscopy due in 2019 Labs

## 2016-06-22 NOTE — Patient Instructions (Signed)

## 2016-06-22 NOTE — Assessment & Plan Note (Signed)
Metformin - not taking Labs

## 2016-08-15 DIAGNOSIS — H524 Presbyopia: Secondary | ICD-10-CM | POA: Diagnosis not present

## 2016-08-17 DIAGNOSIS — Z0100 Encounter for examination of eyes and vision without abnormal findings: Secondary | ICD-10-CM | POA: Diagnosis not present

## 2016-11-29 ENCOUNTER — Emergency Department (HOSPITAL_BASED_OUTPATIENT_CLINIC_OR_DEPARTMENT_OTHER)
Admission: EM | Admit: 2016-11-29 | Discharge: 2016-11-29 | Disposition: A | Discharge status: Home / Self Care | Payer: Medicare HMO | Attending: Emergency Medicine | Admitting: Emergency Medicine

## 2016-11-29 ENCOUNTER — Encounter (HOSPITAL_BASED_OUTPATIENT_CLINIC_OR_DEPARTMENT_OTHER): Payer: Self-pay | Admitting: Emergency Medicine

## 2016-11-29 ENCOUNTER — Emergency Department (HOSPITAL_BASED_OUTPATIENT_CLINIC_OR_DEPARTMENT_OTHER): Payer: Medicare HMO

## 2016-11-29 DIAGNOSIS — Z87891 Personal history of nicotine dependence: Secondary | ICD-10-CM | POA: Diagnosis not present

## 2016-11-29 DIAGNOSIS — I2510 Atherosclerotic heart disease of native coronary artery without angina pectoris: Secondary | ICD-10-CM | POA: Insufficient documentation

## 2016-11-29 DIAGNOSIS — E119 Type 2 diabetes mellitus without complications: Secondary | ICD-10-CM | POA: Diagnosis not present

## 2016-11-29 DIAGNOSIS — R05 Cough: Secondary | ICD-10-CM | POA: Diagnosis not present

## 2016-11-29 DIAGNOSIS — B9789 Other viral agents as the cause of diseases classified elsewhere: Secondary | ICD-10-CM

## 2016-11-29 DIAGNOSIS — Z7982 Long term (current) use of aspirin: Secondary | ICD-10-CM | POA: Insufficient documentation

## 2016-11-29 DIAGNOSIS — J069 Acute upper respiratory infection, unspecified: Secondary | ICD-10-CM | POA: Diagnosis not present

## 2016-11-29 DIAGNOSIS — I10 Essential (primary) hypertension: Secondary | ICD-10-CM | POA: Diagnosis not present

## 2016-11-29 MED ORDER — ALBUTEROL SULFATE HFA 108 (90 BASE) MCG/ACT IN AERS
2.0000 | INHALATION_SPRAY | RESPIRATORY_TRACT | Status: DC | PRN
Start: 2016-11-29 — End: 2016-11-29
  Administered 2016-11-29: 2 via RESPIRATORY_TRACT
  Filled 2016-11-29: qty 6.7

## 2016-11-29 MED ORDER — FLUTICASONE PROPIONATE 50 MCG/ACT NA SUSP: 2.0000 | Freq: Every day | NASAL | 0 refills | Status: DC

## 2016-11-29 MED ORDER — BENZONATATE 100 MG PO CAPS: 100.0000 mg | ORAL_CAPSULE | Freq: Three times a day (TID) | ORAL | 0 refills | Status: DC

## 2016-11-29 NOTE — Discharge Instructions (Signed)
You may use albuterol inhaler 2 puffs every 6 hours as needed for cough.

## 2016-11-29 NOTE — ED Provider Notes (Signed)
[facility] DEPT [facility] Provider Note   CSN: [id number] Arrival date & time: 11/29/16  1541  By signing my name below, I, Jeanell Sparrow, attest that this documentation has been prepared under the direction and in the presence of Gareth Morgan, MD. Electronically Signed: Jeanell Sparrow, Scribe. 11/29/2016. 6:00 PM.  History   Chief Complaint Chief Complaint  Patient presents with  . Cough   The history is provided by the patient. No language interpreter was used.   HPI Comments: Harold Waters is a 71 y.o. male who presents to the Emergency Department complaining of intermittent moderate cough that started 3 days ago. His symptoms were initially worsening, then improved some yesterday. He took OTC medication with some relief (last dose:9am today). He describes the cough as dry and worse at night. He reports associated sore throat, chills, and  myalgias (generalized). He admits to being a former smoker. He denies any sick contacts, hx of asthma, fever, chest pain, SOB, ear pain, nausea, vomiting, diarrhea, or abdominal pain.     PCP: Walker Kehr, MD  Past Medical History:  Diagnosis Date  . Angina   . Coronary atherosclerosis of unspecified type of vessel, native or graft   . Diverticulosis   . Diverticulosis   . DVT of lower extremity (deep venous thrombosis) ([place]) ~ 2010   LLE  . Erosive gastritis   . Essential hypertension, benign   . GERD (gastroesophageal reflux disease)   . Headache(784.0) 02/18/12   "lately"  . History of bronchitis   . Internal hemorrhoids   . Lower back pain   . Myocardial infarction 1997  . Osteoarthritis   . Other and unspecified hyperlipidemia   . PAD (peripheral artery disease) ([facility])    with ABI's 0.8 on the right and 0.86 on the left  . Pneumonia 1957  . PSVT (paroxysmal supraventricular tachycardia) ([place]) 02/18/12  . Shortness of breath    "lying down"  . Sleep apnea    does not wear c-pap    Patient Active Problem List   Diagnosis  Date Noted  . Pain in joint, shoulder region 06/22/2016  . Diabetes mellitus type 2 with complications ([place]) 99991111  . Well adult exam 12/19/2014  . Migraine headache with aura 01/27/2013  . CAD (coronary artery disease) 10/03/2012  . Actinic keratosis 09/06/2012  . Unstable angina ([employer]) 02/18/2012  . PSVT (paroxysmal supraventricular tachycardia) ([place]) 02/18/2012  . Hyperlipidemia 02/18/2012  . Pneumonia, community acquired 10/12/2011  . ATHEROSCLEROSIS W /INT CLAUDICATION 06/02/2010  . CAROTID ARTERY DISEASE 04/23/2010  . OSTEOARTHRITIS 02/27/2010  . ARTHRALGIA 02/27/2010  . LOW BACK PAIN 02/27/2010  . PHARYNGITIS, ACUTE 06/18/2009  . DYSPNEA 04/03/2009  . ACUTE BRONCHITIS 02/12/2009  . VENOUS INSUFFICIENCY 12/19/2008  . EDEMA 12/19/2008  . GERD 02/20/2008  . Abdominal pain 02/20/2008  . Neoplasm of uncertain behavior of skin 10/18/2007  . COUGH 10/18/2007  . Dyslipidemia 09/07/2007  . HYPERTENSION, MILD 09/07/2007  . MYOCARDIAL INFARCTION 09/07/2007  . Coronary atherosclerosis 09/07/2007  . CHEST PAIN, ATYPICAL, HX OF 09/07/2007  . APPENDECTOMY, HX OF 09/07/2007  . PERCUTANEOUS TRANSLUMINAL CORONARY ANGIOPLASTY, HX OF 09/07/2007  . ARTHROSCOPY, KNEE, HX OF 09/07/2007    Past Surgical History:  Procedure Laterality Date  . APPENDECTOMY     hx of  . ARTHROSCOPY KNEE W/ DRILLING  ~ 2001   right  . COLONOSCOPY    . CORONARY ANGIOPLASTY WITH STENT PLACEMENT  1997   "2"  . LEFT HEART CATHETERIZATION WITH CORONARY ANGIOGRAM N/A 02/19/2012  Procedure: LEFT HEART CATHETERIZATION WITH CORONARY ANGIOGRAM;  Surgeon: Larey Dresser, MD;  Location: [facility] CATH LAB;  Service: Cardiovascular;  Laterality: N/A;  . PERIPHERAL ARTERIAL STENT GRAFT  09/2010   LLE  . UPPER GASTROINTESTINAL ENDOSCOPY         Home Medications    Prior to Admission medications   Medication Sig Start Date End Date Taking? Authorizing Provider  aspirin EC 81 MG tablet Take 1 tablet (81 mg total)  by mouth daily. 05/04/16   [name], MD  benzonatate (TESSALON) 100 MG capsule Take 1 capsule (100 mg total) by mouth every 8 (eight) hours. 11/29/16   Gareth Morgan, MD  Cholecalciferol (VITAMIN D3) 2000 units capsule Take 1 capsule (2,000 Units total) by mouth daily. 06/22/16   Aleksei Plotnikov V, MD  fluticasone (FLONASE) 50 MCG/ACT nasal spray Place 2 sprays into both nostrils daily. 11/29/16   Gareth Morgan, MD  lipase/protease/amylase (CREON) 36000 UNITS CPEP capsule Take 1 capsule (36,000 Units total) by mouth 3 (three) times daily before meals. 03/29/15   Aleksei Plotnikov V, MD  Multiple Vitamins-Minerals (MENS MULTIVITAMIN PLUS) TABS Take 1 each by mouth daily.    Historical Provider, MD  nitroGLYCERIN (NITROSTAT) 0.4 MG SL tablet Place 1 tablet (0.4 mg total) under the tongue every 5 (five) minutes as needed. For chest pain Patient not taking: Reported on 06/22/2016 05/04/16   [name], MD  rosuvastatin (CRESTOR) 40 MG tablet Take 1 tablet (40 mg total) by mouth at bedtime. 06/22/16   Aleksei Plotnikov V, MD  traMADol (ULTRAM) 50 MG tablet Take 1-2 tablets (50-100 mg total) by mouth 2 (two) times daily as needed. 06/22/16   Cassandria Anger, MD    Family History Family History  Problem Relation Age of Onset  . Ulcers Father     had stomach issues, not sure what happened  . Coronary artery disease Brother     male 1st degree relative <50  . Colon cancer Neg Hx   . Esophageal cancer Neg Hx   . Rectal cancer Neg Hx   . Stomach cancer Neg Hx     Social History Social History  Substance Use Topics  . Smoking status: Former Smoker    Packs/day: 1.00    Years: 40.00    Types: Cigarettes    Quit date: 04/03/2010  . Smokeless tobacco: Never Used  . Alcohol use Yes     Comment: "used to drink when I was young; quit ~ 1980's"     Allergies   Eggs or egg-derived products   Review of Systems Review of Systems  Constitutional: Positive for chills. Negative for fever.    HENT: Positive for sore throat. Negative for ear pain.   Eyes: Negative for visual disturbance.  Respiratory: Positive for cough (Dry). Negative for shortness of breath.   Cardiovascular: Negative for chest pain.  Gastrointestinal: Negative for abdominal pain, diarrhea, nausea and vomiting.  Genitourinary: Negative for difficulty urinating.  Musculoskeletal: Positive for myalgias. Negative for back pain and neck stiffness.  Skin: Negative for rash.  Neurological: Negative for syncope and headaches.     Physical Exam Updated Vital Signs BP 131/77 (BP Location: Right Arm)   Pulse 66   Temp 98.5 F (36.9 C) (Oral)   Resp 20   Ht 5\' 5"  (1.651 m)   Wt 168 lb (76.2 kg)   SpO2 100%   BMI 27.96 kg/m   Physical Exam  Constitutional: He is oriented to person, place, and time. He appears  well-developed and well-nourished. No distress.  HENT:  Head: Normocephalic and atraumatic.  Right Ear: Tympanic membrane normal.  Left Ear: Tympanic membrane normal.  Mouth/Throat: Uvula is midline, oropharynx is clear and moist and mucous membranes are normal. No oropharyngeal exudate, posterior oropharyngeal edema or posterior oropharyngeal erythema. No tonsillar exudate.  Eyes: Conjunctivae are normal.  Neck: Neck supple.  Cardiovascular: Normal rate and regular rhythm.   Pulmonary/Chest: Effort normal. No respiratory distress. He has no wheezes. He has no rales.  Abdominal: Soft.  Musculoskeletal: Normal range of motion.  Neurological: He is alert and oriented to person, place, and time.  Skin: Skin is warm and dry.  Psychiatric: He has a normal mood and affect.  Nursing note and vitals reviewed.    ED Treatments / Results  DIAGNOSTIC STUDIES: Oxygen Saturation is 100% on RA, normal by my interpretation.    COORDINATION OF CARE: 6:04 PM- Pt advised of plan for treatment and pt agrees.  Labs (all labs ordered are listed, but only abnormal results are displayed) Labs Reviewed - No data  to display  EKG  EKG Interpretation None       Radiology Dg Chest 2 View  Result Date: 11/29/2016 CLINICAL DATA:  Patient with cough, congestion, sore throat and weakness. EXAM: CHEST  2 VIEW COMPARISON:  Chest radiograph 02/18/2012. FINDINGS: The heart size and mediastinal contours are within normal limits. Both lungs are clear. The visualized skeletal structures are unremarkable. IMPRESSION: No active cardiopulmonary disease. Electronically Signed   By: Lovey Newcomer M.D.   On: 11/29/2016 16:54    Procedures Procedures (including critical care time)  Medications Ordered in ED Medications - No data to display   Initial Impression / Assessment and Plan / ED Course  I have reviewed the triage vital signs and the nursing notes.  Pertinent labs & imaging results that were available during my care of the patient were reviewed by me and considered in my medical decision making (see chart for details).     71yo male presents with concern for 3 days of cough, congestion, sore throat, chills.  CXR shows no sign of pneumonia, no pulmonary edema. Patient with influenza like illness, however no fevers, improving symptoms and has had illness for 3 days. Discussed possibility of tamiflu, however given possible side effects with patient having symptoms for greater than 48hr, no fever, we will pursue continued supportive care. Pt with prior hx smoking, given albuterol for cough, and rx for tessalon pearles, flonase. Patient discharged in stable condition with understanding of reasons to return.   Final Clinical Impressions(s) / ED Diagnoses   Final diagnoses:  Viral URI with cough, less likely influenza    New Prescriptions Discharge Medication List as of 11/29/2016  6:10 PM    START taking these medications   Details  benzonatate (TESSALON) 100 MG capsule Take 1 capsule (100 mg total) by mouth every 8 (eight) hours., Starting Sun 11/29/2016, Print    fluticasone (FLONASE) 50 MCG/ACT nasal  spray Place 2 sprays into both nostrils daily., Starting Sun 11/29/2016, Print       I personally performed the services described in this documentation, which was scribed in my presence. The recorded information has been reviewed and is accurate.     Gareth Morgan, MD 11/30/16 1410

## 2016-11-29 NOTE — ED Triage Notes (Signed)
Patient has had a cough and body aches x 2 -3 days

## 2017-03-17 ENCOUNTER — Other Ambulatory Visit: Payer: Self-pay | Admitting: Cardiovascular Disease

## 2017-03-17 DIAGNOSIS — I739 Peripheral vascular disease, unspecified: Secondary | ICD-10-CM

## 2017-03-22 ENCOUNTER — Ambulatory Visit (HOSPITAL_COMMUNITY)
Admission: RE | Admit: 2017-03-22 | Discharge: 2017-03-22 | Disposition: A | Discharge status: Home / Self Care | Payer: Medicare HMO | Source: Ambulatory Visit | Attending: Cardiovascular Disease | Admitting: Cardiovascular Disease

## 2017-03-22 DIAGNOSIS — I739 Peripheral vascular disease, unspecified: Secondary | ICD-10-CM | POA: Diagnosis not present

## 2017-03-22 DIAGNOSIS — Z95820 Peripheral vascular angioplasty status with implants and grafts: Secondary | ICD-10-CM | POA: Insufficient documentation

## 2017-03-22 DIAGNOSIS — I743 Embolism and thrombosis of arteries of the lower extremities: Secondary | ICD-10-CM | POA: Diagnosis not present

## 2017-04-02 ENCOUNTER — Telehealth: Payer: Self-pay | Admitting: Cardiovascular Disease

## 2017-04-02 NOTE — Telephone Encounter (Signed)
Patient states that he is returning a missed call, thanks.

## 2017-04-02 NOTE — Telephone Encounter (Signed)
LE ARTERIAL  Order: [id number]  Status:  Final result Visible to patient:  No (Not Released) Dx:  PVD (peripheral vascular disease) ([place])  Notes recorded by Minichello, Mallory L, LPN on 05/03/1586 at 2:76 AM EDT Made patient aware of results. Pt verbalized understanding and stated he is having pain in his legs. Advised pt Dr. [name] wants pt to see a peripheral vascular surgeon for a consultation and that Dr. Antionette Char nurse would set that up for him. Pt thanked me for my time and call. ------  Notes recorded by Barkley Boards, RN on 03/30/2017 at 2:25 PM EDT Left message on machine for pt to contact the office.   ------  Notes recorded by [name], MD on 03/29/2017 at 9:50 AM EDT Stent occlusion on left (SFA) since last study. ABI now moderately reduced at 0.71 and by description patient having symptoms of intermittent claudication. Recommend PV consultation. thanks

## 2017-04-06 NOTE — Telephone Encounter (Signed)
Pt scheduled for new PV consult with Dr Gwenlyn Found on 04/13/17.

## 2017-04-13 ENCOUNTER — Ambulatory Visit: Payer: Medicare HMO | Admitting: Cardiovascular Disease

## 2017-04-14 ENCOUNTER — Ambulatory Visit (INDEPENDENT_AMBULATORY_CARE_PROVIDER_SITE_OTHER): Payer: Medicare HMO | Admitting: Cardiovascular Disease

## 2017-04-14 ENCOUNTER — Encounter: Payer: Self-pay | Admitting: Cardiovascular Disease

## 2017-04-14 VITALS — BP 96/58 | HR 63 | Ht 65.0 in | Wt 167.4 lb

## 2017-04-14 DIAGNOSIS — Z01812 Encounter for preprocedural laboratory examination: Secondary | ICD-10-CM | POA: Diagnosis not present

## 2017-04-14 DIAGNOSIS — I739 Peripheral vascular disease, unspecified: Secondary | ICD-10-CM | POA: Diagnosis not present

## 2017-04-14 NOTE — Patient Instructions (Signed)
   [facility] [address] [place] [address] Dept: [phone] Loc: [phone]  Harold Waters  04/14/2017  You are scheduled for a Peripheral Angiogram on Thursday, June 21 with Dr. [name].  1. Please arrive at the [facility] (Main Entrance A) at [facility]: [address] [place], [place] [address] at 5:30 AM (two hours before your procedure to ensure your preparation). Free valet parking service is available.   Special note: Every effort is made to have your procedure done on time. Please understand that emergencies sometimes delay scheduled procedures.  2. Diet: Do not eat or drink anything after midnight prior to your procedure except sips of water to take medications.  3. Labs: You will need to have blood drawn on Wednesday, June 13 at [address], [place]  Open: 8am - 4:30pm (Lunch 12:30 - 1:30). You do not need to be fasting.  4. Medication instructions in preparation for your procedure:  On the morning of your procedure, take your Aspirin and any morning medicines NOT listed above.  You may use sips of water.  5. Plan for one night stay--bring personal belongings. 6. Bring a current list of your medications and current insurance cards. 7. You MUST have a responsible person to drive you home. 8. Someone MUST be with you the first 24 hours after you arrive home or your discharge will be delayed. 9. Please wear clothes that are easy to get on and off and wear slip-on shoes.  Thank you for allowing [place] to care for you!   -- [facility]

## 2017-04-14 NOTE — Assessment & Plan Note (Signed)
History of peripheral arterial disease status post left SFA stenting by Dr. [name] of November 2011. Resulted in marked improvement in his claudication symptoms at that time. Dopplers performed 01/13/16 revealed a left ABI 0.96 with a patent SFA stent. However, Dopplers performed 03/22/17 revealed a decline in his left ABI 2.71 with what appears to be an occluded stent. He has had recurrent claudications. We will plan to proceed with angiography and potential re-intervention for lifestyle limiting claudication.

## 2017-04-14 NOTE — Progress Notes (Signed)
04/14/2017 Harold Waters   1945/12/28  [id number]  Primary Physician Plotnikov, Harold Lacks, MD Primary Cardiologist: Harold Harp MD Renae Gloss  HPI:  Harold Waters is a 71 year old thin-appearing married [place] male father of 4 children, grandfather and grandchildren, referred by Dr. Burt Waters for peripheral vascular evaluation. He has a history of treated hyperlipidemia. He has had a remote stroke and has had intervention on his nondominant right answer complex coronary arteries in the past. He stopped smoking 8 years ago but did smoke a pack a day for 40 years. He had a stent placed in his left SFA November 2011 by Dr. Burt Waters which resulted in improvement in his claudication. Over the last several months he's had reappearance of his claudication which is now lifestyle limiting. Dopplers performed 03/22/17 show a decline in his ABI from 0.96-2.71 with what appears to be an occluded left SFA stent.   Current Outpatient Prescriptions  Medication Sig Dispense Refill  . aspirin EC 81 MG tablet Take 1 tablet (81 mg total) by mouth daily.    . Cholecalciferol (VITAMIN D3) 2000 units capsule Take 1 capsule (2,000 Units total) by mouth daily. 100 capsule 3  . Multiple Vitamins-Minerals (MENS MULTIVITAMIN PLUS) TABS Take 1 each by mouth daily.    . nitroGLYCERIN (NITROSTAT) 0.4 MG SL tablet Place 1 tablet (0.4 mg total) under the tongue every 5 (five) minutes as needed. For chest pain 25 tablet 3  . rosuvastatin (CRESTOR) 40 MG tablet Take 1 tablet (40 mg total) by mouth at bedtime. 90 tablet 3   No current facility-administered medications for this visit.     Allergies  Allergen Reactions  . Eggs Or Egg-Derived Products Itching    Social History   Social History  . Marital status: Married    Spouse name: N/A  . Number of children: 2  . Years of education: N/A   Occupational History  . retired    Social History Main Topics  . Smoking status: Former Smoker    Packs/day:  1.00    Years: 40.00    Types: Cigarettes    Quit date: 04/03/2010  . Smokeless tobacco: Never Used  . Alcohol use Yes     Comment: "used to drink when I was young; quit ~ 1980's"  . Drug use: No  . Sexual activity: Yes   Other Topics Concern  . Not on file   Social History Narrative  . No narrative on file     Review of Systems: General: negative for chills, fever, night sweats or weight changes.  Cardiovascular: negative for chest pain, dyspnea on exertion, edema, orthopnea, palpitations, paroxysmal nocturnal dyspnea or shortness of breath Dermatological: negative for rash Respiratory: negative for cough or wheezing Urologic: negative for hematuria Abdominal: negative for nausea, vomiting, diarrhea, bright red blood per rectum, melena, or hematemesis Neurologic: negative for visual changes, syncope, or dizziness All other systems reviewed and are otherwise negative except as noted above.    Blood pressure (!) 96/58, pulse 63, height 5\' 5"  (1.651 m), weight 167 lb 6.4 oz (75.9 kg).  General appearance: alert and no distress Neck: no adenopathy, no carotid bruit, no JVD, supple, symmetrical, trachea midline and thyroid not enlarged, symmetric, no tenderness/mass/nodules Lungs: clear to auscultation bilaterally Heart: regular rate and rhythm, S1, S2 normal, no murmur, click, rub or gallop Extremities: extremities normal, atraumatic, no cyanosis or edema  EKG sinus rhythm at 63 without ST or T-wave changes. I personally reviewed this EKG.  ASSESSMENT AND PLAN:   ATHEROSCLEROSIS W /INT CLAUDICATION History of peripheral arterial disease status post left SFA stenting by Dr. Burt Waters of November 2011. Resulted in marked improvement in his claudication symptoms at that time. Dopplers performed 01/13/16 revealed a left ABI 0.96 with a patent SFA stent. However, Dopplers performed 03/22/17 revealed a decline in his left ABI 2.71 with what appears to be an occluded stent. He has had  recurrent claudications. We will plan to proceed with angiography and potential re-intervention for lifestyle limiting claudication.      Harold Harp MD FACP,FACC,FAHA, [facility] 04/14/2017 10:20 AM

## 2017-04-15 LAB — BASIC METABOLIC PANEL
BUN/Creatinine Ratio: 22 (ref 10–24)
BUN: 24 mg/dL (ref 8–27)
CO2: 25 mmol/L (ref 20–29)
Calcium: 9.5 mg/dL (ref 8.6–10.2)
Chloride: 103 mmol/L (ref 96–106)
Creatinine, Ser: 1.07 mg/dL (ref 0.76–1.27)
GFR calc Af Amer: 80 mL/min/{1.73_m2} (ref >59)
GFR calc non Af Amer: 69 mL/min/{1.73_m2} (ref >59)
Glucose: 112 mg/dL — ABNORMAL HIGH (ref 65–99)
Potassium: 5 mmol/L (ref 3.5–5.2)
Sodium: 141 mmol/L (ref 134–144)

## 2017-04-15 LAB — CBC WITH DIFFERENTIAL/PLATELET
Basophils Absolute: 0 10*3/uL (ref 0.0–0.2)
Basos: 0 %
EOS (ABSOLUTE): 0.7 10*3/uL — ABNORMAL HIGH (ref 0.0–0.4)
Eos: 12 %
Hematocrit: 42.1 % (ref 37.5–51.0)
Hemoglobin: 13.9 g/dL (ref 13.0–17.7)
Immature Grans (Abs): 0 10*3/uL (ref 0.0–0.1)
Immature Granulocytes: 0 %
Lymphocytes Absolute: 1.5 10*3/uL (ref 0.7–3.1)
Lymphs: 26 %
MCH: 28.7 pg (ref 26.6–33.0)
MCHC: 33 g/dL (ref 31.5–35.7)
MCV: 87 fL (ref 79–97)
Monocytes Absolute: 0.4 10*3/uL (ref 0.1–0.9)
Monocytes: 6 %
Neutrophils Absolute: 3.2 10*3/uL (ref 1.4–7.0)
Neutrophils: 56 %
Platelets: 237 10*3/uL (ref 150–379)
RBC: 4.85 x10E6/uL (ref 4.14–5.80)
RDW: 15 % (ref 12.3–15.4)
WBC: 5.7 10*3/uL (ref 3.4–10.8)

## 2017-04-15 LAB — APTT: aPTT: 25 s (ref 24–33)

## 2017-04-15 LAB — PROTIME-INR
INR: 1 (ref 0.8–1.2)
Prothrombin Time: 10.2 s (ref 9.1–12.0)

## 2017-04-16 ENCOUNTER — Ambulatory Visit (INDEPENDENT_AMBULATORY_CARE_PROVIDER_SITE_OTHER): Payer: Medicare HMO | Admitting: Cardiovascular Disease

## 2017-04-16 ENCOUNTER — Encounter: Payer: Self-pay | Admitting: Cardiovascular Disease

## 2017-04-16 VITALS — BP 130/70 | HR 67 | Ht 65.0 in | Wt 167.8 lb

## 2017-04-16 DIAGNOSIS — E785 Hyperlipidemia, unspecified: Secondary | ICD-10-CM | POA: Diagnosis not present

## 2017-04-16 DIAGNOSIS — R0602 Shortness of breath: Secondary | ICD-10-CM

## 2017-04-16 DIAGNOSIS — R011 Cardiac murmur, unspecified: Secondary | ICD-10-CM | POA: Diagnosis not present

## 2017-04-16 DIAGNOSIS — I739 Peripheral vascular disease, unspecified: Secondary | ICD-10-CM

## 2017-04-16 MED ORDER — ROSUVASTATIN CALCIUM 40 MG PO TABS: 40.0000 mg | ORAL_TABLET | Freq: Every day | ORAL | 3 refills | Status: DC

## 2017-04-16 MED ORDER — ASPIRIN EC 81 MG PO TBEC: 81.0000 mg | DELAYED_RELEASE_TABLET | Freq: Every day | ORAL | Status: DC

## 2017-04-16 NOTE — Progress Notes (Signed)
Cardiology Office Note Date:  04/16/2017   ID:  Harold Waters, DOB 1945/12/10, MRN [id number]  PCP:  Cassandria Anger, MD  Cardiologist:  [name], MD    Chief Complaint  Patient presents with  . Follow-up    1 year     History of Present Illness: Harold Waters is a 71 y.o. male who presents for follow-up of coronary artery disease and peripheral arterial disease. The patient is undergone PCI to left circumflex and right coronary artery in the past. He has been followed for PAD with intermittent claudication. He underwent left SFA stenting in the past. He has  developed recurrent symptoms of severe left calf claudication over the last 6 monthsand ABIs demonstrated a drop in perfusion to the left leg. Vascular ultrasound studies demonstrate occlusion of the stented segment in the left SFA. The patient has been referred to Dr. Gwenlyn Found who has scheduled him for angiography later this month.   He develops pain in the left calf within 300 feet of walking. Symptoms resolve within 3-4 minutes. No right leg symptoms. He denies chest pain, edema, heart palpitations, orthopnea, or PND. He does experience shortness of breath with some activities, especially when he's in a hurry.   Past Medical History:  Diagnosis Date  . Angina   . Coronary atherosclerosis of unspecified type of vessel, native or graft   . Diverticulosis   . Diverticulosis   . DVT of lower extremity (deep venous thrombosis) ([place]) ~ 2010   LLE  . Erosive gastritis   . Essential hypertension, benign   . GERD (gastroesophageal reflux disease)   . Headache(784.0) 02/18/12   "lately"  . History of bronchitis   . Internal hemorrhoids   . Lower back pain   . Myocardial infarction ([place]) 1997  . Osteoarthritis   . Other and unspecified hyperlipidemia   . PAD (peripheral artery disease) ([facility])    with ABI's 0.8 on the right and 0.86 on the left  . Pneumonia 1957  . PSVT (paroxysmal supraventricular tachycardia) ([place])  02/18/12  . Shortness of breath    "lying down"  . Sleep apnea    does not wear c-pap    Past Surgical History:  Procedure Laterality Date  . APPENDECTOMY     hx of  . ARTHROSCOPY KNEE W/ DRILLING  ~ 2001   right  . COLONOSCOPY    . CORONARY ANGIOPLASTY WITH STENT PLACEMENT  1997   "2"  . LEFT HEART CATHETERIZATION WITH CORONARY ANGIOGRAM N/A 02/19/2012   Procedure: LEFT HEART CATHETERIZATION WITH CORONARY ANGIOGRAM;  Surgeon: Larey Dresser, MD;  Location: [facility] CATH LAB;  Service: Cardiovascular;  Laterality: N/A;  . PERIPHERAL ARTERIAL STENT GRAFT  09/2010   LLE  . UPPER GASTROINTESTINAL ENDOSCOPY      Current Outpatient Prescriptions  Medication Sig Dispense Refill  . Multiple Vitamins-Minerals (MENS MULTIVITAMIN PLUS) TABS Take 1 each by mouth daily.    . nitroGLYCERIN (NITROSTAT) 0.4 MG SL tablet Place 1 tablet (0.4 mg total) under the tongue every 5 (five) minutes as needed. For chest pain 25 tablet 3  . rosuvastatin (CRESTOR) 40 MG tablet Take 1 tablet (40 mg total) by mouth at bedtime. 90 tablet 3  . aspirin EC 81 MG tablet Take 1 tablet (81 mg total) by mouth daily.     No current facility-administered medications for this visit.     Allergies:   Eggs or egg-derived products   Social History:  The patient  reports that  he quit smoking about 7 years ago. His smoking use included Cigarettes. He has a 40.00 pack-year smoking history. He has never used smokeless tobacco. He reports that he drinks alcohol. He reports that he does not use drugs.   Family History:  The patient's  family history includes Coronary artery disease in his brother; Ulcers in his father.   ROS:  Please see the history of present illness.  Otherwise, review of systems is positive for cough and muscle pain, right shoulder and arm pain.  All other systems are reviewed and negative.    PHYSICAL EXAM: VS:  BP 130/70   Pulse 67   Ht 5\' 5"  (1.651 m)   Wt 167 lb 12.8 oz (76.1 kg)   BMI 27.92 kg/m  ,  BMI Body mass index is 27.92 kg/m. GEN: Well nourished, well developed, in no acute distress  HEENT: normal  Neck: no JVD, no masses. No carotid bruits Cardiac: RRR with 2/6 early peaking systolic ejection murmur at the right upper sternal border                Respiratory:  clear to auscultation bilaterally, normal work of breathing GI: soft, nontender, nondistended, + BS MS: no deformity or atrophy  Ext: no pretibial edema Skin: warm and dry, no rash Neuro:  Strength and sensation are intact Psych: euthymic mood, full affect  EKG:  EKG is ordered today. The ekg ordered today shows NSr 67 bpm, LVH, no significant changes from last year's tracing  Recent Labs: 04/14/2017: BUN 24; Creatinine, Ser 1.07; Hemoglobin 13.9; Platelets 237; Potassium 5.0; Sodium 141   Lipid Panel     Component Value Date/Time   CHOL 183 12/24/2014 0802   TRIG 111.0 12/24/2014 0802   HDL 47.70 12/24/2014 0802   CHOLHDL 4 12/24/2014 0802   VLDL 22.2 12/24/2014 0802   LDLCALC 113 (H) 12/24/2014 0802   LDLDIRECT 113.2 08/19/2011 0855      Wt Readings from Last 3 Encounters:  04/16/17 167 lb 12.8 oz (76.1 kg)  04/14/17 167 lb 6.4 oz (75.9 kg)  11/29/16 168 lb (76.2 kg)     Cardiac Studies Reviewed: ABI's: Impressions Right ABI is in the normal range and stable. Left ABI has decreased and is now in the moderate range. Normal right great toe-brachial index; abnormal left.  See Arterial Duplex study.  Vascular Ultrasound: Diffuse heterogeous plaque throughout the left leg. Interval occlusion of the mid left SFA stent with reconstution of flow in the distal SFA.  See Arterial Doppler report. Suggest PV Consult   ASSESSMENT AND PLAN: 1.  Coronary artery disease, native vessel, without angina: I asked the patient to start back on aspirin 81 mg today. He will continue on Crestor.  2. Lower extremity peripheral arterial disease with intermittent claudication: Recent drop in ABIs on the left  correlate with progressive symptoms of calf claudication. The patient is scheduled for lower extremity angiography and possible PTA/stenting. Advised him to resume aspirin 81 mg.  3. Hyperlipidemia: Treated with Crestor 40 mg daily. The patient needs updated lipids and LFTs. Last labs were checked in February 2016 and are reviewed today.  4. Shortness of breath: Exam is unremarkable with the exception of a systolic flow murmur. With new complaint of shortness of breath will check an echocardiogram for further evaluation.  Current medicines are reviewed with the patient today.  The patient does not have concerns regarding medicines.  Labs/ tests ordered today include:   Orders Placed This Encounter  Procedures  .  Lipid panel  . Hepatic function panel  . EKG 12-Lead  . ECHOCARDIOGRAM COMPLETE    Disposition:   FU one year  Signed, [name], MD  04/16/2017 11:37 AM    [facility] [place], [place], [place]  [address] Phone: [phone]; Fax: [phone]

## 2017-04-16 NOTE — Patient Instructions (Addendum)
Medication Instructions:  Your physician has recommended you make the following change in your medication:  1. START Aspirin 81mg  take one tablet by mouth daily  Labwork: Your physician recommends that you return for a FASTING LIPID and LIVER profile the same day as Echocardiogram--nothing to eat or drink after midnight, lab opens at 7:30 AM  Testing/Procedures: Your physician has requested that you have an echocardiogram. Echocardiography is a painless test that uses sound waves to create images of your heart. It provides your doctor with information about the size and shape of your heart and how well your heart's chambers and valves are working. This procedure takes approximately one hour. There are no restrictions for this procedure.  Follow-Up: Your physician wants you to follow-up in: 1 YEAR with Dr [name]. You will receive a reminder letter in the mail two months in advance. If you don't receive a letter, please call our office to schedule the follow-up appointment.   Any Other Special Instructions Will Be Listed Below (If Applicable).     If you need a refill on your cardiac medications before your next appointment, please call your pharmacy.

## 2017-04-20 ENCOUNTER — Other Ambulatory Visit: Payer: Self-pay | Admitting: Cardiovascular Disease

## 2017-04-20 DIAGNOSIS — I739 Peripheral vascular disease, unspecified: Secondary | ICD-10-CM

## 2017-04-22 ENCOUNTER — Ambulatory Visit (HOSPITAL_COMMUNITY)
Admission: RE | Admit: 2017-04-22 | Discharge: 2017-04-23 | Disposition: A | Discharge status: Home / Self Care | Payer: Medicare HMO | Source: Ambulatory Visit | Attending: Cardiovascular Disease | Admitting: Cardiovascular Disease

## 2017-04-22 ENCOUNTER — Other Ambulatory Visit: Payer: Self-pay | Admitting: Cardiology

## 2017-04-22 ENCOUNTER — Encounter (HOSPITAL_COMMUNITY)
Admission: RE | Disposition: A | Discharge status: Home / Self Care | Payer: Self-pay | Source: Ambulatory Visit | Attending: Cardiovascular Disease

## 2017-04-22 ENCOUNTER — Encounter (HOSPITAL_COMMUNITY): Payer: Self-pay | Admitting: *Deleted

## 2017-04-22 DIAGNOSIS — Z8673 Personal history of transient ischemic attack (TIA), and cerebral infarction without residual deficits: Secondary | ICD-10-CM | POA: Insufficient documentation

## 2017-04-22 DIAGNOSIS — I739 Peripheral vascular disease, unspecified: Secondary | ICD-10-CM | POA: Diagnosis present

## 2017-04-22 DIAGNOSIS — I70219 Atherosclerosis of native arteries of extremities with intermittent claudication, unspecified extremity: Secondary | ICD-10-CM | POA: Diagnosis present

## 2017-04-22 DIAGNOSIS — E785 Hyperlipidemia, unspecified: Secondary | ICD-10-CM | POA: Insufficient documentation

## 2017-04-22 DIAGNOSIS — T82856A Stenosis of peripheral vascular stent, initial encounter: Secondary | ICD-10-CM | POA: Diagnosis not present

## 2017-04-22 DIAGNOSIS — Z87891 Personal history of nicotine dependence: Secondary | ICD-10-CM | POA: Diagnosis not present

## 2017-04-22 DIAGNOSIS — Z91012 Allergy to eggs: Secondary | ICD-10-CM | POA: Diagnosis not present

## 2017-04-22 DIAGNOSIS — Y713 Surgical instruments, materials and cardiovascular devices (including sutures) associated with adverse incidents: Secondary | ICD-10-CM | POA: Insufficient documentation

## 2017-04-22 DIAGNOSIS — I70213 Atherosclerosis of native arteries of extremities with intermittent claudication, bilateral legs: Secondary | ICD-10-CM | POA: Insufficient documentation

## 2017-04-22 DIAGNOSIS — Z7982 Long term (current) use of aspirin: Secondary | ICD-10-CM | POA: Diagnosis not present

## 2017-04-22 DIAGNOSIS — Z7902 Long term (current) use of antithrombotics/antiplatelets: Secondary | ICD-10-CM | POA: Insufficient documentation

## 2017-04-22 DIAGNOSIS — I700 Atherosclerosis of aorta: Secondary | ICD-10-CM | POA: Insufficient documentation

## 2017-04-22 DIAGNOSIS — I70212 Atherosclerosis of native arteries of extremities with intermittent claudication, left leg: Secondary | ICD-10-CM | POA: Diagnosis not present

## 2017-04-22 HISTORY — PX: PERIPHERAL VASCULAR INTERVENTION: CATH118257

## 2017-04-22 HISTORY — PX: PERIPHERAL VASCULAR ATHERECTOMY: CATH118256

## 2017-04-22 HISTORY — PX: LOWER EXTREMITY ANGIOGRAPHY: CATH118251

## 2017-04-22 HISTORY — PX: ILIAC ARTERY STENT: SHX1786

## 2017-04-22 LAB — POCT ACTIVATED CLOTTING TIME
Activated Clotting Time: 213 seconds
Activated Clotting Time: 257 seconds
Activated Clotting Time: 235 seconds
Activated Clotting Time: 180 seconds
Activated Clotting Time: 153 seconds

## 2017-04-22 SURGERY — LOWER EXTREMITY ANGIOGRAPHY
Anesthesia: LOCAL | Laterality: Left

## 2017-04-22 MED ORDER — SODIUM CHLORIDE 0.9% FLUSH: 3.0000 mL | INTRAVENOUS | Status: DC | PRN

## 2017-04-22 MED ORDER — ROSUVASTATIN CALCIUM 20 MG PO TABS
40.0000 mg | ORAL_TABLET | Freq: Every day | ORAL | Status: DC
  Administered 2017-04-22: 40 mg via ORAL
  Filled 2017-04-22: qty 2

## 2017-04-22 MED ORDER — HEPARIN SODIUM (PORCINE) 1000 UNIT/ML IJ SOLN
INTRAMUSCULAR | Status: DC | PRN
  Administered 2017-04-22: 8000 [IU] via INTRAVENOUS
  Administered 2017-04-22: 4000 [IU] via INTRAVENOUS

## 2017-04-22 MED ORDER — ACETAMINOPHEN 325 MG PO TABS: 650.0000 mg | ORAL_TABLET | ORAL | Status: DC | PRN

## 2017-04-22 MED ORDER — ASPIRIN EC 81 MG PO TBEC
81.0000 mg | DELAYED_RELEASE_TABLET | Freq: Every day | ORAL | Status: DC
  Administered 2017-04-23: 81 mg via ORAL
  Filled 2017-04-22: qty 1

## 2017-04-22 MED ORDER — HEPARIN (PORCINE) IN NACL 2-0.9 UNIT/ML-% IJ SOLN
INTRAMUSCULAR | Status: AC | PRN
  Administered 2017-04-22: 1000 mL

## 2017-04-22 MED ORDER — HYDRALAZINE HCL 20 MG/ML IJ SOLN: 10.0000 mg | INTRAMUSCULAR | Status: DC | PRN

## 2017-04-22 MED ORDER — HEPARIN SODIUM (PORCINE) 1000 UNIT/ML IJ SOLN
INTRAMUSCULAR | Status: AC
  Filled 2017-04-22: qty 1

## 2017-04-22 MED ORDER — CLOPIDOGREL BISULFATE 300 MG PO TABS
ORAL_TABLET | ORAL | Status: DC | PRN
  Administered 2017-04-22: 300 mg via ORAL

## 2017-04-22 MED ORDER — IODIXANOL 320 MG/ML IV SOLN
INTRAVENOUS | Status: DC | PRN
  Administered 2017-04-22: 170 mL via INTRAVENOUS

## 2017-04-22 MED ORDER — LIDOCAINE HCL (PF) 1 % IJ SOLN
INTRAMUSCULAR | Status: AC
  Filled 2017-04-22: qty 30

## 2017-04-22 MED ORDER — CLOPIDOGREL BISULFATE 75 MG PO TABS
75.0000 mg | ORAL_TABLET | Freq: Every day | ORAL | Status: DC
  Administered 2017-04-23: 09:00:00 75 mg via ORAL
  Filled 2017-04-22: qty 1

## 2017-04-22 MED ORDER — HEPARIN (PORCINE) IN NACL 2-0.9 UNIT/ML-% IJ SOLN
INTRAMUSCULAR | Status: AC
  Filled 2017-04-22: qty 1000

## 2017-04-22 MED ORDER — SODIUM CHLORIDE 0.9 % WEIGHT BASED INFUSION
3.0000 mL/kg/h | INTRAVENOUS | Status: DC
  Administered 2017-04-22: 3 mL/kg/h via INTRAVENOUS

## 2017-04-22 MED ORDER — ATROPINE SULFATE 1 MG/10ML IJ SOSY
PREFILLED_SYRINGE | INTRAMUSCULAR | Status: AC
  Filled 2017-04-22: qty 10

## 2017-04-22 MED ORDER — FENTANYL CITRATE (PF) 100 MCG/2ML IJ SOLN
INTRAMUSCULAR | Status: DC | PRN
  Administered 2017-04-22: 25 ug via INTRAVENOUS

## 2017-04-22 MED ORDER — MIDAZOLAM HCL 2 MG/2ML IJ SOLN
INTRAMUSCULAR | Status: DC | PRN
  Administered 2017-04-22: 1 mg via INTRAVENOUS

## 2017-04-22 MED ORDER — ASPIRIN 81 MG PO CHEW
CHEWABLE_TABLET | ORAL | Status: AC
  Administered 2017-04-22: 81 mg via ORAL
  Filled 2017-04-22: qty 1

## 2017-04-22 MED ORDER — MORPHINE SULFATE (PF) 4 MG/ML IV SOLN: 2.0000 mg | INTRAVENOUS | Status: DC | PRN

## 2017-04-22 MED ORDER — SODIUM CHLORIDE 0.9 % IV SOLN
INTRAVENOUS | Status: AC
  Administered 2017-04-22: 18:00:00 via INTRAVENOUS

## 2017-04-22 MED ORDER — ONDANSETRON HCL 4 MG/2ML IJ SOLN: 4.0000 mg | Freq: Four times a day (QID) | INTRAMUSCULAR | Status: DC | PRN

## 2017-04-22 MED ORDER — ASPIRIN EC 81 MG PO TBEC: 81.0000 mg | DELAYED_RELEASE_TABLET | Freq: Every day | ORAL | Status: DC

## 2017-04-22 MED ORDER — FENTANYL CITRATE (PF) 100 MCG/2ML IJ SOLN
INTRAMUSCULAR | Status: AC
  Filled 2017-04-22: qty 2

## 2017-04-22 MED ORDER — LIDOCAINE HCL (PF) 1 % IJ SOLN
INTRAMUSCULAR | Status: DC | PRN
  Administered 2017-04-22: 25 mL

## 2017-04-22 MED ORDER — ANGIOPLASTY BOOK
Freq: Once | Status: AC
Start: 2017-04-22 — End: 2017-04-22
  Administered 2017-04-22: 21:00:00
  Filled 2017-04-22: qty 1

## 2017-04-22 MED ORDER — CLOPIDOGREL BISULFATE 300 MG PO TABS
ORAL_TABLET | ORAL | Status: AC
  Filled 2017-04-22: qty 1

## 2017-04-22 MED ORDER — SODIUM CHLORIDE 0.9 % WEIGHT BASED INFUSION: 1.0000 mL/kg/h | INTRAVENOUS | Status: DC

## 2017-04-22 MED ORDER — ASPIRIN 81 MG PO CHEW
81.0000 mg | CHEWABLE_TABLET | ORAL | Status: AC
  Administered 2017-04-22: 81 mg via ORAL

## 2017-04-22 MED ORDER — MIDAZOLAM HCL 2 MG/2ML IJ SOLN
INTRAMUSCULAR | Status: AC
  Filled 2017-04-22: qty 2

## 2017-04-22 SURGICAL SUPPLY — 26 items
BALLN COYOTE OTW 2X60X150 (BALLOONS) ×3
BALLN STERLING OTW 5X80X135 (BALLOONS) ×3
BALLOON COYOTE OTW 2X60X150 (BALLOONS) IMPLANT
BALLOON STERLING OTW 5X80X135 (BALLOONS) IMPLANT
CATH ANGIO 5F PIGTAIL 100CM (CATHETERS) ×1 IMPLANT
CATH CROSS OVER TEMPO 5F (CATHETERS) ×1 IMPLANT
CATH HAWKONE LX EXTENDED TIP (CATHETERS) ×1 IMPLANT
CATH NAVICROSS ST .035X135CM (MICROCATHETER) ×1 IMPLANT
DEVICE CONTINUOUS FLUSH (MISCELLANEOUS) ×1 IMPLANT
DEVICE SPIDERFX EMB PROT 6MM (WIRE) ×1 IMPLANT
GLIDEWIRE ANGLED SS 035X260CM (WIRE) ×1 IMPLANT
KIT ENCORE 26 ADVANTAGE (KITS) ×1 IMPLANT
KIT PV (KITS) ×3 IMPLANT
NDL SMART 18GX3.5CM LONG (NEEDLE) IMPLANT
NEEDLE SMART 18GX3.5CM LONG (NEEDLE) ×3 IMPLANT
SHEATH HIGHFLEX ANSEL 7FR 55CM (SHEATH) ×1 IMPLANT
SHEATH PINNACLE 5F 10CM (SHEATH) ×1 IMPLANT
SHEATH PINNACLE 7F 10CM (SHEATH) ×1 IMPLANT
STENT VIABAHN 6X100X120 (Permanent Stent) ×1 IMPLANT
STOPCOCK MORSE 400PSI 3WAY (MISCELLANEOUS) ×1 IMPLANT
SYRINGE MEDRAD AVANTA MACH 7 (SYRINGE) ×1 IMPLANT
TRANSDUCER W/STOPCOCK (MISCELLANEOUS) ×3 IMPLANT
TRAY PV CATH (CUSTOM PROCEDURE TRAY) ×3 IMPLANT
TUBING CIL FLEX 10 FLL-RA (TUBING) ×1 IMPLANT
WIRE HITORQ VERSACORE ST 145CM (WIRE) ×1 IMPLANT
WIRE SPARTACORE .014X300CM (WIRE) ×1 IMPLANT

## 2017-04-22 NOTE — Care Management Note (Signed)
Case Management Note  Patient Details  Name: Harold Waters MRN: [id number] Date of Birth: November 16, 1945  Subjective/Objective:   From home with wife pta indep, s/p Peripheral Vascular Atherectomy, will be on plavix. He has [employer].   PCP  Aleksei Plotnikov                Action/Plan: NCM will follow for dc needs.   Expected Discharge Date:                  Expected Discharge Plan:  Home/Self Care  In-House Referral:     Discharge planning Services  CM Consult  Post Acute Care Choice:    Choice offered to:     DME Arranged:    DME Agency:     HH Arranged:    [place] Agency:     Status of Service:  Completed, signed off  If discussed at [employer] of Stay Meetings, dates discussed:    Additional Comments:  Zenon Mayo, RN 04/22/2017, 10:57 AM

## 2017-04-22 NOTE — Interval H&P Note (Signed)
History and Physical Interval Note:  04/22/2017 9:12 AM  Harold Waters  has presented today for surgery, with the diagnosis of claudiation  The various methods of treatment have been discussed with the patient and family. After consideration of risks, benefits and other options for treatment, the patient has consented to  Procedure(s) with comments: Lower Extremity Angiography (Bilateral) Peripheral Vascular Atherectomy (Left) - SFA Peripheral Vascular Intervention (Left) - SFA as a surgical intervention .  The patient's history has been reviewed, patient examined, no change in status, stable for surgery.  I have reviewed the patient's chart and labs.  Questions were answered to the patient's satisfaction.     [name]

## 2017-04-22 NOTE — Discharge Summary (Signed)
Discharge Summary    Patient ID: Harold Waters,  MRN: [id number], DOB/AGE: 04/05/1946 71 y.o.  Admit date: 04/22/2017 Discharge date: 04/23/2017  Primary Care Provider: Cassandria Anger Primary Cardiologist: Gwenlyn Found   Discharge Diagnoses    Active Problems:   Atherosclerosis of native artery of extremity with intermittent claudication ([place])   Claudication in peripheral vascular disease ([place])   Allergies Allergies  Allergen Reactions  . Eggs Or Egg-Derived Products Itching    Diagnostic Studies/Procedures    PV angiogram: 04/22/17  Angiographic Data:   1: Abdominal aorta-there was moderate distal abdominal aortic atherosclerotic changes 2: Left lower extremity-the previously placed mid to distal left SFA lites and also extending stent was totally occluded with in-stent restenosis. There was three-vessel runoff with moderate proximal anterior tibial disease 3: Right lower extremity-there was 30% segmental mid right SFA stenosis with an occluded anterior tibial  IMPRESSION: Harold Waters has not occluded mid to distal left SFA stent with symptomatic claudication. We'll proceed with endovascular therapy with the intent to perform atherectomy and drug-eluting balloon angioplasty.  Final Impression: Successful left SFA CTO PTA, [place] 1 directional atherectomy using distal protection and Viabahn  covered stenting of a totally occluded previously placed left SFA nitinol self expanding stent for lifestyle limiting claudication. The patient tolerated the procedure well. The sheath will be removed once the ACT falls below 170 and pressure held. He'll be gently hydrated and discharged home in the morning. We will get lower extremity arterial Doppler studies in our [place] office next week and I will see him back 2-3 weeks thereafter. He will be on dual antiplatelet therapy.  [name]. MD, [facility] _____________   History of Present Illness     71 year old thin-appearing married  [place] male referred to Dr. Gwenlyn Found by Dr. [name] for peripheral vascular evaluation. He has a history of treated hyperlipidemia. He has had a remote stroke and has had intervention on his nondominant right answer complex coronary arteries in the past. He stopped smoking 8 years ago but did smoke a pack a day for 40 years. He had a stent placed in his left SFA November 2011 by Dr. [name] which resulted in improvement in his claudication. Over the last several months he's had reappearance of his claudication which is now lifestyle limiting. Dopplers performed 03/22/17 show a decline in his ABI from 0.96-2.71 with what appears to be an occluded left SFA stent. He was seen in the office on 04/14/17 by Dr. Gwenlyn Found and referred for peripheral angiography.   Hospital Course     He presented on 04/22/17 and underwent peripheral angiography with Dr. Gwenlyn Found noted above with successful left SFA CTO with atherectomy of previous left SFA nitinol stent. Plan for DAPT with ASA/plavix. Post cath labs showed Cr 1.17 and Hgb 13.3. No reported complications noted. Able to ambulate without difficulty.   He was seen by Dr. [name] and determined stable for discharge home. Follow up dopplers and appt have been arranged. Medications are listed below.   General: Well developed, well nourished, male appearing in no acute distress. Head: Normocephalic, atraumatic.  Neck: Supple without bruits, JVD. Lungs:  Resp regular and unlabored, CTA. Heart: RRR, S1, S2, no S3, S4, or murmur; no rub. Abdomen: Soft, non-tender, non-distended with normoactive bowel sounds. No hepatomegaly. No rebound/guarding. No obvious abdominal masses. Extremities: No clubbing, cyanosis, edema. Distal pedal pulses are 2+ bilaterally. R femoral cath site stable without bruising or hematoma Neuro: Alert and oriented X 3. Moves all extremities  spontaneously. Psych: Normal affect.  _____________  Discharge Vitals Blood pressure (!) 142/82, pulse (!) 58,  temperature 97.9 F (36.6 C), temperature source Oral, resp. rate 17, height 5\' 5"  (1.651 m), weight 173 lb 8 oz (78.7 kg), SpO2 94 %.  Filed Weights   04/22/17 0547 04/23/17 0117  Weight: 167 lb (75.8 kg) 173 lb 8 oz (78.7 kg)    Labs & Radiologic Studies    CBC  Recent Labs  04/23/17 0347  WBC 7.9  HGB 13.3  HCT 41.6  MCV 88.3  PLT 169   Basic Metabolic Panel  Recent Labs  04/23/17 0347  NA 139  K 4.0  CL 106  CO2 26  GLUCOSE 164*  BUN 17  CREATININE 1.17  CALCIUM 8.8*   Liver Function Tests No results for input(s): AST, ALT, ALKPHOS, BILITOT, PROT, ALBUMIN in the last 72 hours. No results for input(s): LIPASE, AMYLASE in the last 72 hours. Cardiac Enzymes No results for input(s): CKTOTAL, CKMB, CKMBINDEX, TROPONINI in the last 72 hours. BNP Invalid input(s): POCBNP D-Dimer No results for input(s): DDIMER in the last 72 hours. Hemoglobin A1C No results for input(s): HGBA1C in the last 72 hours. Fasting Lipid Panel No results for input(s): CHOL, HDL, LDLCALC, TRIG, CHOLHDL, LDLDIRECT in the last 72 hours. Thyroid Function Tests No results for input(s): TSH, T4TOTAL, T3FREE, THYROIDAB in the last 72 hours.  Invalid input(s): FREET3 _____________  No results found. Disposition   Pt is being discharged home today in good condition.  Follow-up Plans & Appointments    Follow-up Information    Harold Harp, MD Follow up on 05/07/2017.   Specialties:  Cardiology, Radiology Why:  at 9:15am for your follow up appt. The office will call to arrange your follow up doppler scans.  Contact information: [address] [place] [address] [phone]            Discharge Medications   Current Discharge Medication List    START taking these medications   Details  clopidogrel (PLAVIX) 75 MG tablet Take 1 tablet (75 mg total) by mouth daily with breakfast. Qty: 90 tablet, Refills: 0      CONTINUE these medications which have  CHANGED   Details  aspirin EC 81 MG tablet Take 1 tablet (81 mg total) by mouth daily.      CONTINUE these medications which have NOT CHANGED   Details  Multiple Vitamins-Minerals (MENS MULTIVITAMIN PLUS) TABS Take 1 tablet by mouth at bedtime.     rosuvastatin (CRESTOR) 40 MG tablet Take 1 tablet (40 mg total) by mouth at bedtime. Qty: 90 tablet, Refills: 3          Outstanding Labs/Studies   Follow up dopplers  Duration of Discharge Encounter   Greater than 30 minutes including physician time.  Signed, Reino Bellis NP-C 04/23/2017, 7:25 AM   Patient seen, examined. Available data reviewed. Agree with findings, assessment, and plan as outlined by Reino Bellis, NP-C. The patient is independently interviewed and examined. He is well-known to me. His right groin site is clear. There is no leg edema. Peripheral pulses are equal bilaterally. The patient has walked this morning without claudication symptoms. He understands the importance of aspirin and Plavix at discharge. His follow-up is arranged with Dr. Gwenlyn Found.  [name], M.D. 04/23/2017 9:24 AM

## 2017-04-22 NOTE — H&P (View-Only) (Signed)
04/14/2017 Harold Waters   1946/08/25  [id number]  Primary Physician Plotnikov, [name], MD Primary Cardiologist: Harold Harp MD Harold Waters  HPI:  Harold Waters is a 71 year old thin-appearing married [place] male father of 4 children, grandfather and grandchildren, referred by Dr. Burt Waters for peripheral vascular evaluation. He has a history of treated hyperlipidemia. He has had a remote stroke and has had intervention on his nondominant right answer complex coronary arteries in the past. He stopped smoking 8 years ago but did smoke a pack a day for 40 years. He had a stent placed in his left SFA November 2011 by Dr. Burt Waters which resulted in improvement in his claudication. Over the last several months he's had reappearance of his claudication which is now lifestyle limiting. Dopplers performed 03/22/17 show a decline in his ABI from 0.96-2.71 with what appears to be an occluded left SFA stent.   Current Outpatient Prescriptions  Medication Sig Dispense Refill  . aspirin EC 81 MG tablet Take 1 tablet (81 mg total) by mouth daily.    . Cholecalciferol (VITAMIN D3) 2000 units capsule Take 1 capsule (2,000 Units total) by mouth daily. 100 capsule 3  . Multiple Vitamins-Minerals (MENS MULTIVITAMIN PLUS) TABS Take 1 each by mouth daily.    . nitroGLYCERIN (NITROSTAT) 0.4 MG SL tablet Place 1 tablet (0.4 mg total) under the tongue every 5 (five) minutes as needed. For chest pain 25 tablet 3  . rosuvastatin (CRESTOR) 40 MG tablet Take 1 tablet (40 mg total) by mouth at bedtime. 90 tablet 3   No current facility-administered medications for this visit.     Allergies  Allergen Reactions  . Eggs Or Egg-Derived Products Itching    Social History   Social History  . Marital status: Married    Spouse name: N/A  . Number of children: 2  . Years of education: N/A   Occupational History  . retired    Social History Main Topics  . Smoking status: Former Smoker    Packs/day:  1.00    Years: 40.00    Types: Cigarettes    Quit date: 04/03/2010  . Smokeless tobacco: Never Used  . Alcohol use Yes     Comment: "used to drink when I was young; quit ~ 1980's"  . Drug use: No  . Sexual activity: Yes   Other Topics Concern  . Not on file   Social History Narrative  . No narrative on file     Review of Systems: General: negative for chills, fever, night sweats or weight changes.  Cardiovascular: negative for chest pain, dyspnea on exertion, edema, orthopnea, palpitations, paroxysmal nocturnal dyspnea or shortness of breath Dermatological: negative for rash Respiratory: negative for cough or wheezing Urologic: negative for hematuria Abdominal: negative for nausea, vomiting, diarrhea, bright red blood per rectum, melena, or hematemesis Neurologic: negative for visual changes, syncope, or dizziness All other systems reviewed and are otherwise negative except as noted above.    Blood pressure (!) 96/58, pulse 63, height 5\' 5"  (1.651 m), weight 167 lb 6.4 oz (75.9 kg).  General appearance: alert and no distress Neck: no adenopathy, no carotid bruit, no JVD, supple, symmetrical, trachea midline and thyroid not enlarged, symmetric, no tenderness/mass/nodules Lungs: clear to auscultation bilaterally Heart: regular rate and rhythm, S1, S2 normal, no murmur, click, rub or gallop Extremities: extremities normal, atraumatic, no cyanosis or edema  EKG sinus rhythm at 63 without ST or T-wave changes. I personally reviewed this EKG.  ASSESSMENT AND PLAN:   ATHEROSCLEROSIS W /INT CLAUDICATION History of peripheral arterial disease status post left SFA stenting by Dr. Burt Waters of November 2011. Resulted in marked improvement in his claudication symptoms at that time. Dopplers performed 01/13/16 revealed a left ABI 0.96 with a patent SFA stent. However, Dopplers performed 03/22/17 revealed a decline in his left ABI 2.71 with what appears to be an occluded stent. He has had  recurrent claudications. We will plan to proceed with angiography and potential re-intervention for lifestyle limiting claudication.      Harold Harp MD FACP,FACC,FAHA, [facility] 04/14/2017 10:20 AM

## 2017-04-22 NOTE — Progress Notes (Signed)
Site area: right groin  Site Prior to Removal:  Level 0  Pressure Applied For 25 MINUTES    Minutes Beginning at 1300  Manual:   Yes.    Patient Status During Pull:  AAO X4  Post Pull Groin Site:  Level 0  Post Pull Instructions Given:  Yes.    Post Pull Pulses Present:  Yes.    Dressing Applied:  Yes.    Comments:  Tolerated procedure well

## 2017-04-23 DIAGNOSIS — I70212 Atherosclerosis of native arteries of extremities with intermittent claudication, left leg: Secondary | ICD-10-CM | POA: Diagnosis not present

## 2017-04-23 DIAGNOSIS — T82856A Stenosis of peripheral vascular stent, initial encounter: Secondary | ICD-10-CM | POA: Diagnosis not present

## 2017-04-23 DIAGNOSIS — Z91012 Allergy to eggs: Secondary | ICD-10-CM | POA: Diagnosis not present

## 2017-04-23 DIAGNOSIS — Z8673 Personal history of transient ischemic attack (TIA), and cerebral infarction without residual deficits: Secondary | ICD-10-CM | POA: Diagnosis not present

## 2017-04-23 DIAGNOSIS — Z7982 Long term (current) use of aspirin: Secondary | ICD-10-CM | POA: Diagnosis not present

## 2017-04-23 DIAGNOSIS — Z7902 Long term (current) use of antithrombotics/antiplatelets: Secondary | ICD-10-CM | POA: Diagnosis not present

## 2017-04-23 DIAGNOSIS — I700 Atherosclerosis of aorta: Secondary | ICD-10-CM | POA: Diagnosis not present

## 2017-04-23 DIAGNOSIS — I70213 Atherosclerosis of native arteries of extremities with intermittent claudication, bilateral legs: Secondary | ICD-10-CM | POA: Diagnosis not present

## 2017-04-23 DIAGNOSIS — Z87891 Personal history of nicotine dependence: Secondary | ICD-10-CM | POA: Diagnosis not present

## 2017-04-23 DIAGNOSIS — E785 Hyperlipidemia, unspecified: Secondary | ICD-10-CM | POA: Diagnosis not present

## 2017-04-23 LAB — BASIC METABOLIC PANEL
Anion gap: 7 (ref 5–15)
BUN: 17 mg/dL (ref 6–20)
CO2: 26 mmol/L (ref 22–32)
Calcium: 8.8 mg/dL — ABNORMAL LOW (ref 8.9–10.3)
Chloride: 106 mmol/L (ref 101–111)
Creatinine, Ser: 1.17 mg/dL (ref 0.61–1.24)
GFR calc Af Amer: >60 mL/min (ref >60)
GFR calc non Af Amer: >60 mL/min (ref >60)
Glucose, Bld: 164 mg/dL — ABNORMAL HIGH (ref 65–99)
Potassium: 4 mmol/L (ref 3.5–5.1)
Sodium: 139 mmol/L (ref 135–145)

## 2017-04-23 LAB — CBC
HCT: 41.6 % (ref 39.0–52.0)
Hemoglobin: 13.3 g/dL (ref 13.0–17.0)
MCH: 28.2 pg (ref 26.0–34.0)
MCHC: 32 g/dL (ref 30.0–36.0)
MCV: 88.3 fL (ref 78.0–100.0)
Platelets: 217 10*3/uL (ref 150–400)
RBC: 4.71 MIL/uL (ref 4.22–5.81)
RDW: 14.3 % (ref 11.5–15.5)
WBC: 7.9 10*3/uL (ref 4.0–10.5)

## 2017-04-23 MED ORDER — CLOPIDOGREL BISULFATE 75 MG PO TABS: 75.0000 mg | ORAL_TABLET | Freq: Every day | ORAL | 0 refills | Status: DC

## 2017-04-23 MED ORDER — ASPIRIN EC 81 MG PO TBEC: 81.0000 mg | DELAYED_RELEASE_TABLET | Freq: Every day | ORAL | Status: DC

## 2017-04-30 ENCOUNTER — Other Ambulatory Visit: Payer: Self-pay | Admitting: Cardiovascular Disease

## 2017-04-30 ENCOUNTER — Other Ambulatory Visit: Payer: Self-pay | Admitting: Cardiology

## 2017-04-30 DIAGNOSIS — I739 Peripheral vascular disease, unspecified: Secondary | ICD-10-CM

## 2017-05-03 ENCOUNTER — Other Ambulatory Visit: Payer: Self-pay

## 2017-05-03 ENCOUNTER — Ambulatory Visit (HOSPITAL_COMMUNITY): Payer: Medicare HMO | Attending: Internal Medicine

## 2017-05-03 ENCOUNTER — Other Ambulatory Visit: Payer: Medicare HMO | Admitting: *Deleted

## 2017-05-03 DIAGNOSIS — R011 Cardiac murmur, unspecified: Secondary | ICD-10-CM | POA: Diagnosis not present

## 2017-05-03 DIAGNOSIS — I739 Peripheral vascular disease, unspecified: Secondary | ICD-10-CM | POA: Diagnosis not present

## 2017-05-03 DIAGNOSIS — R0602 Shortness of breath: Secondary | ICD-10-CM | POA: Insufficient documentation

## 2017-05-03 DIAGNOSIS — I700 Atherosclerosis of aorta: Secondary | ICD-10-CM | POA: Insufficient documentation

## 2017-05-03 DIAGNOSIS — E785 Hyperlipidemia, unspecified: Secondary | ICD-10-CM

## 2017-05-03 LAB — HEPATIC FUNCTION PANEL
ALT: 26 IU/L (ref 0–44)
AST: 19 IU/L (ref 0–40)
Albumin: 4.1 g/dL (ref 3.5–4.8)
Alkaline Phosphatase: 71 IU/L (ref 39–117)
Bilirubin Total: 0.2 mg/dL (ref 0.0–1.2)
Bilirubin, Direct: 0.07 mg/dL (ref 0.00–0.40)
Total Protein: 6.6 g/dL (ref 6.0–8.5)

## 2017-05-03 LAB — LIPID PANEL
Chol/HDL Ratio: 3.4 ratio (ref 0.0–5.0)
Cholesterol, Total: 156 mg/dL (ref 100–199)
HDL: 46 mg/dL (ref >39)
LDL Calculated: 80 mg/dL (ref 0–99)
Triglycerides: 150 mg/dL — ABNORMAL HIGH (ref 0–149)
VLDL Cholesterol Cal: 30 mg/dL (ref 5–40)

## 2017-05-07 ENCOUNTER — Encounter: Payer: Self-pay | Admitting: Cardiovascular Disease

## 2017-05-07 ENCOUNTER — Ambulatory Visit (INDEPENDENT_AMBULATORY_CARE_PROVIDER_SITE_OTHER): Payer: Medicare HMO | Admitting: Cardiovascular Disease

## 2017-05-07 VITALS — BP 137/80 | HR 62 | Ht 65.0 in | Wt 167.0 lb

## 2017-05-07 DIAGNOSIS — I739 Peripheral vascular disease, unspecified: Secondary | ICD-10-CM

## 2017-05-07 NOTE — Progress Notes (Signed)
05/07/2017 Harold Waters   [birth year]  [id number]  Primary Physician Plotnikov, [name], MD Primary Cardiologist: [name] MD Renae Gloss  HPI:  Harold Waters is a 71 year old thin-appearing married Harold Waters male father of 4 children, grandfather and grandchildren, referred by Dr. [name] for peripheral vascular evaluation. I last saw him in the office 04/14/17. He has a history of treated hyperlipidemia. He has had a remote stroke and has had intervention on his nondominant right answer complex coronary arteries in the past. He stopped smoking 8 years ago but did smoke a pack a day for 40 years. He had a stent placed in his left SFA November 2011 by Dr. [name] which resulted in improvement in his claudication. Over the last several months he's had reappearance of his claudication which is now lifestyle limiting. Dopplers performed 03/22/17 show a decline in his ABI from 0.96-2.71 with what appears to be an occluded left SFA stent.  I perform peripheral angiography and endovascular intervention on him 04/22/17. He had an occluded left SFA stent which I opened up and placed a Viabahn  covered stent with an excellent result. He did have 3 vessel. His claudication has completely resolved.   Current Outpatient Prescriptions  Medication Sig Dispense Refill  . aspirin EC 81 MG tablet Take 1 tablet (81 mg total) by mouth daily.    . clopidogrel (PLAVIX) 75 MG tablet Take 1 tablet (75 mg total) by mouth daily with breakfast. 90 tablet 0  . Multiple Vitamins-Minerals (MENS MULTIVITAMIN PLUS) TABS Take 1 tablet by mouth at bedtime.     . rosuvastatin (CRESTOR) 40 MG tablet Take 1 tablet (40 mg total) by mouth at bedtime. 90 tablet 3   No current facility-administered medications for this visit.     Allergies  Allergen Reactions  . Eggs Or Egg-Derived Products Itching    Social History   Social History  . Marital status: Married    Spouse name: N/A  . Number of children: 2  .  Years of education: N/A   Occupational History  . retired    Social History Main Topics  . Smoking status: Former Smoker    Packs/day: 1.00    Years: 40.00    Types: Cigarettes    Quit date: 04/03/2010  . Smokeless tobacco: Never Used  . Alcohol use Yes     Comment: "used to drink when I was young; quit ~ 1980's"  . Drug use: No  . Sexual activity: Yes   Other Topics Concern  . Not on file   Social History Narrative  . No narrative on file     Review of Systems: General: negative for chills, fever, night sweats or weight changes.  Cardiovascular: negative for chest pain, dyspnea on exertion, edema, orthopnea, palpitations, paroxysmal nocturnal dyspnea or shortness of breath Dermatological: negative for rash Respiratory: negative for cough or wheezing Urologic: negative for hematuria Abdominal: negative for nausea, vomiting, diarrhea, bright red blood per rectum, melena, or hematemesis Neurologic: negative for visual changes, syncope, or dizziness All other systems reviewed and are otherwise negative except as noted above.    Blood pressure 137/80, pulse 62, height 5\' 5"  (1.651 m), weight 167 lb (75.8 kg).  General appearance: alert and no distress Neck: no adenopathy, no carotid bruit, no JVD, supple, symmetrical, trachea midline and thyroid not enlarged, symmetric, no tenderness/mass/nodules Lungs: clear to auscultation bilaterally Heart: normal apical impulse Extremities: Right femoral arterial puncture site is well-healed. He has a 2+  left pedal pulse.  EKG not performed today  ASSESSMENT AND PLAN:   Claudication in peripheral vascular disease [facility]) Harold Waters returns today for post hospital follow-up after his endovascular procedure which I performed 04/22/17. He had a prior stent placed in his left SFA by Dr. [name] November 2011 which by duplex had occluded. Angiography confirmed this. I performed PTA and covered stenting using a Viabahn covered  stent with  excellent results. He had three-vessel runoff below the knee. His claudication has completely resolved.      [name] MD FACP,FACC,FAHA, [facility] 05/07/2017 10:06 AM

## 2017-05-07 NOTE — Patient Instructions (Signed)
Medication Instructions: Your physician recommends that you continue on your current medications as directed. Please refer to the Current Medication list given to you today.  Testing/Procedures: Your physician has requested that you have a lower extremity arterial duplex. During this test, ultrasound is used to evaluate arterial blood flow in the legs. Allow one hour for this exam. There are no restrictions or special instructions.  Your physician has requested that you have an ankle brachial index (ABI). During this test an ultrasound and blood pressure cuff are used to evaluate the arteries that supply the arms and legs with blood. Allow thirty minutes for this exam. There are no restrictions or special instructions.  (Keep appointment for doppler scheduled on 7/10; then every 6 months.)  Follow-Up: Your physician recommends that you schedule a follow-up appointment as needed with Dr. Gwenlyn Found.  Continue to follow-up with Dr. [name].

## 2017-05-07 NOTE — Assessment & Plan Note (Signed)
Mr. Bogosian returns today for post hospital follow-up after his endovascular procedure which I performed 04/22/17. He had a prior stent placed in his left SFA by Dr. [name] November 2011 which by duplex had occluded. Angiography confirmed this. I performed PTA and covered stenting using a Viabahn covered  stent with excellent results. He had three-vessel runoff below the knee. His claudication has completely resolved.

## 2017-05-11 ENCOUNTER — Ambulatory Visit (HOSPITAL_COMMUNITY)
Admission: RE | Admit: 2017-05-11 | Discharge: 2017-05-11 | Disposition: A | Discharge status: Home / Self Care | Payer: Medicare HMO | Source: Ambulatory Visit | Attending: Cardiovascular Disease | Admitting: Cardiovascular Disease

## 2017-05-11 DIAGNOSIS — Z48812 Encounter for surgical aftercare following surgery on the circulatory system: Secondary | ICD-10-CM | POA: Diagnosis not present

## 2017-05-11 DIAGNOSIS — I10 Essential (primary) hypertension: Secondary | ICD-10-CM | POA: Diagnosis not present

## 2017-05-11 DIAGNOSIS — Z95820 Peripheral vascular angioplasty status with implants and grafts: Secondary | ICD-10-CM | POA: Insufficient documentation

## 2017-05-11 DIAGNOSIS — I739 Peripheral vascular disease, unspecified: Secondary | ICD-10-CM | POA: Diagnosis not present

## 2017-05-11 DIAGNOSIS — Z87891 Personal history of nicotine dependence: Secondary | ICD-10-CM | POA: Diagnosis not present

## 2017-05-11 DIAGNOSIS — I2510 Atherosclerotic heart disease of native coronary artery without angina pectoris: Secondary | ICD-10-CM | POA: Insufficient documentation

## 2017-05-11 DIAGNOSIS — E1151 Type 2 diabetes mellitus with diabetic peripheral angiopathy without gangrene: Secondary | ICD-10-CM | POA: Diagnosis not present

## 2017-05-11 DIAGNOSIS — I70202 Unspecified atherosclerosis of native arteries of extremities, left leg: Secondary | ICD-10-CM | POA: Diagnosis not present

## 2017-05-26 ENCOUNTER — Other Ambulatory Visit: Payer: Self-pay | Admitting: Cardiovascular Disease

## 2017-05-26 DIAGNOSIS — I739 Peripheral vascular disease, unspecified: Secondary | ICD-10-CM

## 2017-08-21 ENCOUNTER — Other Ambulatory Visit: Payer: Self-pay | Admitting: Cardiology

## 2017-08-23 NOTE — Telephone Encounter (Signed)
REFILL 

## 2017-11-24 ENCOUNTER — Ambulatory Visit (HOSPITAL_COMMUNITY)
Admission: RE | Admit: 2017-11-24 | Discharge: 2017-11-24 | Disposition: A | Discharge status: Home / Self Care | Payer: PPO | Source: Ambulatory Visit | Attending: Cardiology | Admitting: Cardiology

## 2017-11-24 DIAGNOSIS — I739 Peripheral vascular disease, unspecified: Secondary | ICD-10-CM | POA: Diagnosis not present

## 2017-12-03 ENCOUNTER — Other Ambulatory Visit: Payer: Self-pay | Admitting: Cardiovascular Disease

## 2017-12-03 DIAGNOSIS — I739 Peripheral vascular disease, unspecified: Secondary | ICD-10-CM

## 2017-12-28 ENCOUNTER — Encounter: Payer: Self-pay | Admitting: Gastroenterology

## 2018-01-03 ENCOUNTER — Telehealth: Payer: Self-pay | Admitting: Cardiovascular Disease

## 2018-01-03 NOTE — Telephone Encounter (Signed)
New Message   STAT if patient feels like he/she is going to faint   1) Are you dizzy now? no  2) Do you feel faint or have you passed out? no  3) Do you have any other symptoms? Low energy, blurry vision, headache, red eye, and weakness  4) Have you checked your HR and BP (record if available)? Pt son wasn't sure  Pt son called stating that he wanted his father to go to the ER but the pt insisted he speak with his heart doctor for recommendation. Please call

## 2018-01-03 NOTE — Telephone Encounter (Signed)
1) Spoke with patient at 10:30 am. He had an episode that lasted a couple of hours. He bent over and and got real dizzy, off balance, and eyes felt like they were not right. By the time I spoke with him he was feeling better. This is about the 3rd time this has happened over the last couple of years. He does get regular eye exams. Does not check his blood pressure at home.   2) Patient is also c/o left leg feeling weak, tired, and having numbness in his toes. This all started about 2 weeks after his LE arterial doppler. Did advise that his recent doppler was stable however he wanted Dr Kennon Holter opinion. Will forward to Dr Gwenlyn Found for review

## 2018-01-03 NOTE — Telephone Encounter (Signed)
LEA on 1/23 look great!!!

## 2018-01-06 NOTE — Telephone Encounter (Signed)
Spoke to pt and informed him of what Dr. Gwenlyn Found said. Pt stated he will call his PCP, Dr. Alain Marion to see if something else could be causing symptoms. He satated he is fine when he is sitting down, but experiences pain in legs when walking. Pt verbalized thanks for the call.

## 2018-05-04 DIAGNOSIS — H25813 Combined forms of age-related cataract, bilateral: Secondary | ICD-10-CM | POA: Diagnosis not present

## 2018-07-12 ENCOUNTER — Other Ambulatory Visit: Payer: Self-pay | Admitting: Cardiovascular Disease

## 2018-07-21 ENCOUNTER — Other Ambulatory Visit: Payer: Self-pay | Admitting: Cardiovascular Disease

## 2018-07-21 NOTE — Telephone Encounter (Signed)
This is Dr. Berry's pt 

## 2018-08-12 ENCOUNTER — Encounter: Payer: Self-pay | Admitting: Cardiovascular Disease

## 2018-08-12 ENCOUNTER — Ambulatory Visit (INDEPENDENT_AMBULATORY_CARE_PROVIDER_SITE_OTHER): Payer: PPO | Admitting: Cardiovascular Disease

## 2018-08-12 VITALS — BP 142/80 | HR 65 | Ht 65.0 in | Wt 170.6 lb

## 2018-08-12 DIAGNOSIS — R072 Precordial pain: Secondary | ICD-10-CM

## 2018-08-12 DIAGNOSIS — I739 Peripheral vascular disease, unspecified: Secondary | ICD-10-CM

## 2018-08-12 NOTE — Assessment & Plan Note (Signed)
89 busters returns today for follow-up of his PAD.  He was initially referred to me by Dr. [name] for claudication history of remote left SFA stenting back in November 2011.  I angiogram him 04/22/2017 revealing occluded mid left SFA stent with three-vessel runoff and intervene with a [facility] 1 directional atherectomy device followed by a via bond covered stent.  This resulted in resolution of his claudication symptoms.  Recent Dopplers performed 11/24/2017 revealed normal ABIs bilaterally with a widely patent stent.  Over the last several months he is noticed some left calf claudication which is new.  We will repeat lower extremity arterial Doppler studies.

## 2018-08-12 NOTE — Patient Instructions (Signed)
Medication Instructions:  NO CHANGE If you need a refill on your cardiac medications before your next appointment, please call your pharmacy.   Lab work: If you have labs (blood work) drawn today and your tests are completely normal, you will receive your results only by: [name] MyChart Message (if you have MyChart) OR . A paper copy in the mail If you have any lab test that is abnormal or we need to change your treatment, we will call you to review the results.  Testing/Procedures: Your physician has requested that you have en exercise stress myoview. For further information please visit HugeFiesta.[email or web address]. Please follow instruction sheet, as given.   Your physician has requested that you have an echocardiogram. Echocardiography is a painless test that uses sound waves to create images of your heart. It provides your doctor with information about the size and shape of your heart and how well your heart's chambers and valves are working. This procedure takes approximately one hour. There are no restrictions for this procedure.   Your physician has requested that you have a lower extremity arterial duplex. This test is an ultrasound of the arteries in the legs or arms. It looks at arterial blood flow in the legs Allow one hour for Lower Arterial scans. There are no restrictions or special instructions   Follow-Up: Your physician recommends that you schedule a follow-up appointment in: AFTER ECHO AND STRESS TEST COMPLETE WITH DR [facility]  Your physician wants you to follow-up in: ONE YEAR WITH DR BERRY   YOU WILL NEED TO CALL PRIOR TO THE APPOINTMENT TIME TO SCHEDULE APPOINTMENT

## 2018-08-12 NOTE — Progress Notes (Signed)
08/12/2018 Harold Waters   07-Mar-1946  [id number]  Primary Physician Plotnikov, [name], MD Primary Cardiologist: [name] MD Lupe Carney, [place]  HPI:  Harold Waters is a 72 y.o.  thin-appearing married Latino male father of 4 children, grandfather and grandchildren, referred by Dr. [name] for peripheral vascular evaluation. I last saw him in the office 05/07/2017. He has a history of treated hyperlipidemia. He has had a remote stroke and has had intervention on his nondominant right answer complex coronary arteries in the past. He stopped smoking 8 years ago but did smoke a pack a day for 40 years. He had a stent placed in his left SFA November 2011 by Dr. [name] which resulted in improvement in his claudication. Over the last several months he's had reappearance of his claudication which is now lifestyle limiting. Dopplers performed 03/22/17 show a decline in his ABI from 0.96-2.71 with what appears to be an occluded left SFA stent. I perform peripheral angiography and endovascular intervention on him 04/22/17. He had an occluded left SFA stent which I opened up and placed a Viabahn  covered stent with an excellent result. He did have 3 vessel. His claudication has completely resolved.  Since I saw him a year ago he has developed some recurrent left calf claudication.  He had lower extremity Dopplers performed 11/24/2017 that revealed normal ABIs bilaterally with a patent stent.  In addition, he has noticed some increasing exertional chest pain and shortness of breath. Current Meds  Medication Sig  . aspirin EC 81 MG tablet Take 1 tablet (81 mg total) by mouth daily.  . clopidogrel (PLAVIX) 75 MG tablet TAKE 1 TABLET (75 MG TOTAL) BY MOUTH DAILY WITH BREAKFAST.  . Multiple Vitamins-Minerals (MENS MULTIVITAMIN PLUS) TABS Take 1 tablet by mouth at bedtime.   . rosuvastatin (CRESTOR) 40 MG tablet Take 1 tablet (40 mg total) by mouth daily.     Allergies  Allergen Reactions  .  Eggs Or Egg-Derived Products Itching    Social History   Socioeconomic History  . Marital status: Married    Spouse name: Not on file  . Number of children: 2  . Years of education: Not on file  . Highest education level: Not on file  Occupational History  . Occupation: retired  Scientific laboratory technician  . Financial resource strain: Not on file  . Food insecurity:    Worry: Not on file    Inability: Not on file  . Transportation needs:    Medical: Not on file    Non-medical: Not on file  Tobacco Use  . Smoking status: Former Smoker    Packs/day: 1.00    Years: 40.00    Pack years: 40.00    Types: Cigarettes    Last attempt to quit: 04/03/2010    Years since quitting: 8.3  . Smokeless tobacco: Never Used  Substance and Sexual Activity  . Alcohol use: Yes    Comment: "used to drink when I was young; quit ~ 1980's"  . Drug use: No  . Sexual activity: Yes  Lifestyle  . Physical activity:    Days per week: Not on file    Minutes per session: Not on file  . Stress: Not on file  Relationships  . Social connections:    Talks on phone: Not on file    Gets together: Not on file    Attends religious service: Not on file    Active member of club or organization:  Not on file    Attends meetings of clubs or organizations: Not on file    Relationship status: Not on file  . Intimate partner violence:    Fear of current or ex partner: Not on file    Emotionally abused: Not on file    Physically abused: Not on file    Forced sexual activity: Not on file  Other Topics Concern  . Not on file  Social History Narrative  . Not on file     Review of Systems: General: negative for chills, fever, night sweats or weight changes.  Cardiovascular: negative for chest pain, dyspnea on exertion, edema, orthopnea, palpitations, paroxysmal nocturnal dyspnea or shortness of breath Dermatological: negative for rash Respiratory: negative for cough or wheezing Urologic: negative for hematuria Abdominal:  negative for nausea, vomiting, diarrhea, bright red blood per rectum, melena, or hematemesis Neurologic: negative for visual changes, syncope, or dizziness All other systems reviewed and are otherwise negative except as noted above.    Blood pressure (!) 142/80, pulse 65, height 5\' 5"  (1.651 m), weight 170 lb 9.6 oz (77.4 kg).  General appearance: alert and no distress Neck: no adenopathy, no carotid bruit, no JVD, supple, symmetrical, trachea midline and thyroid not enlarged, symmetric, no tenderness/mass/nodules Lungs: clear to auscultation bilaterally Heart: regular rate and rhythm, S1, S2 normal, no murmur, click, rub or gallop Extremities: extremities normal, atraumatic, no cyanosis or edema Pulses: 2+ and symmetric Skin: Skin color, texture, turgor normal. No rashes or lesions Neurologic: Alert and oriented X 3, normal strength and tone. Normal symmetric reflexes. Normal coordination and gait  EKG sinus rhythm at 65 without ST or T wave changes.  Personally reviewed this EKG.  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  He was initially referred to me by Dr. [name] for claudication history of remote left SFA stenting back in November 2011.  I angiogram him 04/22/2017 revealing occluded mid left SFA stent with three-vessel runoff and intervene with a [facility] 1 directional atherectomy device followed by a via bond covered stent.  This resulted in resolution of his claudication symptoms.  Recent Dopplers performed 11/24/2017 revealed normal ABIs bilaterally with a widely patent stent.  Over the last several months he is noticed some left calf claudication which is new.  We will repeat lower extremity arterial Doppler studies.  Coronary atherosclerosis History of CAD status post remote circumflex and RCA intervention followed by Dr. [name].  Recently he is noticed increasing exertional chest pain and dyspnea on  exertion.  I am going to get a 2D echo and exercise Myoview and have him see Dr. [name] back in follow-up.      [name] MD FACP,FACC,FAHA, [facility] 08/12/2018 3:49 PM

## 2018-08-12 NOTE — Assessment & Plan Note (Signed)
History of CAD status post remote circumflex and RCA intervention followed by Dr. [name].  Recently he is noticed increasing exertional chest pain and dyspnea on exertion.  I am going to get a 2D echo and exercise Myoview and have him see Dr. [name] back in follow-up.

## 2018-08-16 ENCOUNTER — Telehealth: Payer: Self-pay

## 2018-08-16 NOTE — Telephone Encounter (Signed)
Per Dr. Gwenlyn Found, scheduled patient for follow-up with Dr. [name] on 11/1 (after scheduled testing). Instructed patient to call back to confirm or cancel appointment.

## 2018-08-16 NOTE — Telephone Encounter (Signed)
Patient called and confirmed visit with Dr. [name] 11/1. He was grateful for call and agrees with treatment plan.

## 2018-08-19 ENCOUNTER — Ambulatory Visit (HOSPITAL_COMMUNITY): Payer: PPO | Attending: Cardiovascular Disease

## 2018-08-19 ENCOUNTER — Other Ambulatory Visit: Payer: Self-pay

## 2018-08-19 DIAGNOSIS — R072 Precordial pain: Secondary | ICD-10-CM | POA: Insufficient documentation

## 2018-08-26 ENCOUNTER — Telehealth (HOSPITAL_COMMUNITY): Payer: Self-pay

## 2018-08-26 NOTE — Telephone Encounter (Signed)
Encounter complete. 

## 2018-08-31 ENCOUNTER — Encounter: Payer: Self-pay | Admitting: Cardiovascular Disease

## 2018-08-31 ENCOUNTER — Ambulatory Visit (HOSPITAL_COMMUNITY)
Admission: RE | Admit: 2018-08-31 | Discharge: 2018-08-31 | Disposition: A | Discharge status: Home / Self Care | Payer: PPO | Source: Ambulatory Visit | Attending: Cardiology | Admitting: Cardiology

## 2018-08-31 DIAGNOSIS — R072 Precordial pain: Secondary | ICD-10-CM | POA: Insufficient documentation

## 2018-09-01 ENCOUNTER — Ambulatory Visit (HOSPITAL_BASED_OUTPATIENT_CLINIC_OR_DEPARTMENT_OTHER)
Admission: RE | Admit: 2018-09-01 | Discharge: 2018-09-01 | Disposition: A | Discharge status: Home / Self Care | Payer: PPO | Source: Ambulatory Visit | Attending: Cardiology | Admitting: Cardiology

## 2018-09-01 ENCOUNTER — Ambulatory Visit (HOSPITAL_COMMUNITY)
Admission: RE | Admit: 2018-09-01 | Discharge: 2018-09-01 | Disposition: A | Discharge status: Home / Self Care | Payer: PPO | Source: Ambulatory Visit | Attending: Cardiology | Admitting: Cardiology

## 2018-09-01 DIAGNOSIS — R072 Precordial pain: Secondary | ICD-10-CM | POA: Insufficient documentation

## 2018-09-01 DIAGNOSIS — I739 Peripheral vascular disease, unspecified: Secondary | ICD-10-CM

## 2018-09-01 LAB — MYOCARDIAL PERFUSION IMAGING
LV dias vol: 121 mL (ref 62–150)
LV sys vol: 65 mL
Peak HR: 85 {beats}/min
Rest HR: 75 {beats}/min
SDS: 5
SRS: 0
SSS: 5
TID: 1.09

## 2018-09-01 MED ORDER — TECHNETIUM TC 99M TETROFOSMIN IV KIT
9.6000 | PACK | Freq: Once | INTRAVENOUS | Status: AC | PRN
  Administered 2018-09-01: 9.6 via INTRAVENOUS
  Filled 2018-09-01: qty 10

## 2018-09-01 MED ORDER — TECHNETIUM TC 99M TETROFOSMIN IV KIT
30.6000 | PACK | Freq: Once | INTRAVENOUS | Status: AC | PRN
  Administered 2018-09-01: 30.6 via INTRAVENOUS
  Filled 2018-09-01: qty 31

## 2018-09-01 MED ORDER — REGADENOSON 0.4 MG/5ML IV SOLN
0.4000 mg | Freq: Once | INTRAVENOUS | Status: AC
  Administered 2018-09-01: 0.4 mg via INTRAVENOUS

## 2018-09-02 ENCOUNTER — Ambulatory Visit: Payer: PPO | Admitting: Cardiovascular Disease

## 2018-09-02 ENCOUNTER — Encounter: Payer: Self-pay | Admitting: Cardiovascular Disease

## 2018-09-02 VITALS — BP 142/76 | HR 65 | Ht 65.0 in | Wt 168.8 lb

## 2018-09-02 DIAGNOSIS — I739 Peripheral vascular disease, unspecified: Secondary | ICD-10-CM

## 2018-09-02 DIAGNOSIS — I2510 Atherosclerotic heart disease of native coronary artery without angina pectoris: Secondary | ICD-10-CM | POA: Diagnosis not present

## 2018-09-02 DIAGNOSIS — I25119 Atherosclerotic heart disease of native coronary artery with unspecified angina pectoris: Secondary | ICD-10-CM

## 2018-09-02 LAB — BASIC METABOLIC PANEL
BUN/Creatinine Ratio: 16 (ref 10–24)
BUN: 20 mg/dL (ref 8–27)
CO2: 25 mmol/L (ref 20–29)
Calcium: 9.6 mg/dL (ref 8.6–10.2)
Chloride: 100 mmol/L (ref 96–106)
Creatinine, Ser: 1.22 mg/dL (ref 0.76–1.27)
GFR calc Af Amer: 68 mL/min/{1.73_m2} (ref >59)
GFR calc non Af Amer: 59 mL/min/{1.73_m2} — ABNORMAL LOW (ref >59)
Glucose: 102 mg/dL — ABNORMAL HIGH (ref 65–99)
Potassium: 4.9 mmol/L (ref 3.5–5.2)
Sodium: 142 mmol/L (ref 134–144)

## 2018-09-02 LAB — CBC WITH DIFFERENTIAL/PLATELET
Basophils Absolute: 0 10*3/uL (ref 0.0–0.2)
Basos: 0 %
EOS (ABSOLUTE): 0.7 10*3/uL — ABNORMAL HIGH (ref 0.0–0.4)
Eos: 13 %
Hematocrit: 42.6 % (ref 37.5–51.0)
Hemoglobin: 14.1 g/dL (ref 13.0–17.7)
Immature Grans (Abs): 0 10*3/uL (ref 0.0–0.1)
Immature Granulocytes: 0 %
Lymphocytes Absolute: 1.7 10*3/uL (ref 0.7–3.1)
Lymphs: 30 %
MCH: 28.8 pg (ref 26.6–33.0)
MCHC: 33.1 g/dL (ref 31.5–35.7)
MCV: 87 fL (ref 79–97)
Monocytes Absolute: 0.4 10*3/uL (ref 0.1–0.9)
Monocytes: 8 %
Neutrophils Absolute: 2.8 10*3/uL (ref 1.4–7.0)
Neutrophils: 49 %
Platelets: 258 10*3/uL (ref 150–450)
RBC: 4.9 x10E6/uL (ref 4.14–5.80)
RDW: 14.2 % (ref 12.3–15.4)
WBC: 5.6 10*3/uL (ref 3.4–10.8)

## 2018-09-02 MED ORDER — CLOPIDOGREL BISULFATE 75 MG PO TABS: 75.0000 mg | ORAL_TABLET | Freq: Every day | ORAL | 3 refills | Status: DC

## 2018-09-02 MED ORDER — ISOSORBIDE MONONITRATE ER 30 MG PO TB24: 30.0000 mg | ORAL_TABLET | Freq: Every day | ORAL | 3 refills | Status: DC

## 2018-09-02 NOTE — Patient Instructions (Signed)
Medication Instructions:  1) START ISOSORBIDE 30 mg daily 2) START PLAVIX 75 mg daily  Labwork: TODAY: BMET, CBC  Testing/Procedures: Your physician has requested that you have a cardiac catheterization. Cardiac catheterization is used to diagnose and/or treat various heart conditions. Doctors may recommend this procedure for a number of different reasons. The most common reason is to evaluate chest pain. Chest pain can be a symptom of coronary artery disease (CAD), and cardiac catheterization can show whether plaque is narrowing or blocking your heart's arteries. This procedure is also used to evaluate the valves, as well as measure the blood flow and oxygen levels in different parts of your heart. For further information please visit HugeFiesta.[email or web address]. Please follow instruction sheet, as given.   Dr. [name] recommends you have a LOWER EXTREMITY ANGIOGRAPHY.  Follow-Up: Your provider recommends that you schedule a follow-up appointment AS NEEDED with Dr. Gwenlyn Found pending study results.  Any Other Special Instructions Will Be Listed Below (If Applicable).    [place] OFFICE [place], [place] [address] Dept: [phone] Loc: [place]  09/02/2018  You are scheduled for a Cardiac Catheterization and peripheral angiogram on Monday, November 18 with Dr. [name].  1. Please arrive at the [facility] (Main Entrance A) at [facility]: [address] [place], [place] [address] at 5:30 AM (This time is two hours before your procedure to ensure your preparation). Free valet parking service is available.   Special note: Every effort is made to have your procedure done on time. Please understand that emergencies sometimes delay scheduled procedures.  2. Diet: Do not eat solid foods after midnight.  The patient may have clear liquids until 5am upon the day of the  procedure.  3. Labs:   4. Medication instructions in preparation for your procedure:  1) TAKE ASPIRIN AND PLAVIX the morning of your procedure.  2) you may take your other medications as directed with sips of water.  5. Plan for one night stay--bring personal belongings. 6. Bring a current list of your medications and current insurance cards. 7. You MUST have a responsible person to drive you home. 8. Someone MUST be with you the first 24 hours after you arrive home or your discharge will be delayed. 9. Please wear clothes that are easy to get on and off and wear slip-on shoes.  Thank you for allowing [place] to care for you!   -- [place] [facility]

## 2018-09-02 NOTE — Progress Notes (Signed)
Cardiology Office Note:    Date:  09/02/2018   ID:  Harold Waters, DOB 10-15-1946, MRN [id number]  PCP:  Harold Anger, MD  Cardiologist:  No primary care provider on file.  Electrophysiologist:  None   Referring MD: Harold Anger, MD   Chief Complaint  Patient presents with  . Chest Pain  . Claudication   History of Present Illness:    Harold Waters is a 72 y.o. male with a hx of CAD and PAD, presenting for follow-up evaluation.   The patient is here alone today.  He is having trouble with left calf claudication.  He is noted progressive symptoms over the last several months.  The patient underwent atherectomy and Viabahn stenting of the left SFA in June 2018.  He had previously undergone left SFA stenting and 2011 but developed recurrent claudication and drop and left ABI.  In angiography he was found to have stent occlusion and he was treated accordingly as above.  The patient just underwent repeat Doppler studies yesterday demonstrating a drop in his ABI now down to 0.7 with ultrasound findings demonstrating total occlusion of his left SFA stent.  He denies rest pain, but he is experiencing left calf aching with short distance walking of less than 1 city block.  In addition, the patient has developed a burning discomfort in his chest that occurs with physical activity.  Symptoms are relieved with rest.  There is also associated shortness of breath.  No orthopnea, PND, or leg swelling.  He has not taken nitroglycerin. He denies resting chest pain or prolonged episodes of chest pain.  Past Medical History:  Diagnosis Date  . Angina   . Coronary atherosclerosis of unspecified type of vessel, native or graft   . Diverticulosis   . Diverticulosis   . DVT of lower extremity (deep venous thrombosis) ([place]) ~ 2010   LLE  . Erosive gastritis   . Essential hypertension, benign   . GERD (gastroesophageal reflux disease)   . Headache(784.0) 02/18/12   "lately"  . History of  bronchitis   . Internal hemorrhoids   . Lower back pain   . Myocardial infarction ([place]) 1997  . Osteoarthritis   . Other and unspecified hyperlipidemia   . PAD (peripheral artery disease) ([facility])    with ABI's 0.8 on the right and 0.86 on the left  . Pneumonia 1957  . PSVT (paroxysmal supraventricular tachycardia) ([place]) 02/18/12  . Shortness of breath    "lying down"  . Sleep apnea    does not wear c-pap    Past Surgical History:  Procedure Laterality Date  . APPENDECTOMY     hx of  . ARTHROSCOPY KNEE W/ DRILLING  ~ 2001   right  . COLONOSCOPY    . CORONARY ANGIOPLASTY WITH STENT PLACEMENT  1997   "2"  . ILIAC ARTERY STENT  04/22/2017   . Placement of a 6 mm x 100 mm Viabahn covered stent left SFA  . LEFT HEART CATHETERIZATION WITH CORONARY ANGIOGRAM N/A 02/19/2012   Procedure: LEFT HEART CATHETERIZATION WITH CORONARY ANGIOGRAM;  Surgeon: Larey Dresser, MD;  Location[facility] C[facility] LAB;  Service: Cardiovascular;  Laterality: N/A;  . LOWER EXTREMITY ANGIOGRAPHY Bilateral 04/22/2017   Procedure: Lower Extremity Angiography;  Surgeon: [name], MD;  Location: [place] CV LAB;  Service: Cardiovascular;  Laterality: Bilateral;  . PERIPHERAL ARTERIAL STENT GRAFT  09/2010   LLE  . PERIPHERAL VASCULAR ATHERECTOMY Left 04/22/2017   Procedure: Peripheral Vascular  Atherectomy;  Surgeon: [name], MD;  Location: [place] CV LAB;  Service: Cardiovascular;  Laterality: Left;  SFA  . PERIPHERAL VASCULAR INTERVENTION Left 04/22/2017   Procedure: Peripheral Vascular Intervention;  Surgeon: [name], MD;  Location: [place] CV LAB;  Service: Cardiovascular;  Laterality: Left;  SFA  . UPPER GASTROINTESTINAL ENDOSCOPY      Current Medications: Current Meds  Medication Sig  . aspirin EC 81 MG tablet Take 1 tablet (81 mg total) by mouth daily.  . Multiple Vitamins-Minerals (MENS MULTIVITAMIN PLUS) TABS Take 1 tablet by mouth at bedtime.   . rosuvastatin (CRESTOR) 40 MG  tablet Take 1 tablet (40 mg total) by mouth daily.     Allergies:   Eggs or egg-derived products   Social History   Socioeconomic History  . Marital status: Married    Spouse name: Not on file  . Number of children: 2  . Years of education: Not on file  . Highest education level: Not on file  Occupational History  . Occupation: retired  Scientific laboratory technician  . Financial resource strain: Not on file  . Food insecurity:    Worry: Not on file    Inability: Not on file  . Transportation needs:    Medical: Not on file    Non-medical: Not on file  Tobacco Use  . Smoking status: Former Smoker    Packs/day: 1.00    Years: 40.00    Pack years: 40.00    Types: Cigarettes    Last attempt to quit: 04/03/2010    Years since quitting: 8.4  . Smokeless tobacco: Never Used  Substance and Sexual Activity  . Alcohol use: Yes    Comment: "used to drink when I was young; quit ~ 1980's"  . Drug use: No  . Sexual activity: Yes  Lifestyle  . Physical activity:    Days per week: Not on file    Minutes per session: Not on file  . Stress: Not on file  Relationships  . Social connections:    Talks on phone: Not on file    Gets together: Not on file    Attends religious service: Not on file    Active member of club or organization: Not on file    Attends meetings of clubs or organizations: Not on file    Relationship status: Not on file  Other Topics Concern  . Not on file  Social History Narrative  . Not on file     Family History: The patient's family history includes Coronary artery disease in his brother; Ulcers in his father. There is no history of Colon cancer, Esophageal cancer, Rectal cancer, or Stomach cancer.  ROS:   Please see the history of present illness.    Positive for weight gain, hearing loss, visual disturbance, back pain, muscle pain, leg pain, wheezing, shortness of breath with activity, joint swelling.  All other systems reviewed and are negative.  EKGs/Labs/Other  Studies Reviewed:    The following studies were reviewed today: ABI December 01, 2017: Final Interpretation: Right: Resting right ankle-brachial index is within normal range. No evidence of significant right lower extremity arterial disease. The right toe-brachial index is abnormal. Left: Resting left ankle-brachial index is within normal range. No evidence of significant left lower extremity arterial disease. The left toe-brachial index is normal.  ABI 09-01-2018: Summary: Right: Resting right ankle-brachial index is within normal range. No evidence of significant right lower extremity arterial disease. The right toe-brachial index is normal.  Left:  Resting left ankle-brachial index indicates moderate left lower extremity arterial disease. The left toe-brachial index is abnormal.  Summary: Left: Severe progression is noted compared to previous study. Atherosclerosis in the common femoral, femoral and popliteal arteries The mid/distal SFA and stent appears to be occluded with reconstitution of flow at the above knee poplieal artery.   See table(s) above for measurements and observations.  Suggest follow up study in 6 months. Vascular consult recommended.  Myoview 09-01-2018: Nuclear Stress Findings   Isotope administration Rest isotope was administered with an IV injection of 9.6 mCi Tc14m Tetrofosmin. Rest SPECT images were obtained approximately 45 minutes post tracer injection. Stress isotope was administered with an IV injection of 30.6 mCi Tc53m Tetrofosmin Stress SPECT images were obtained approximately 60 minutes post tracer injection.  Nuclear Study Quality Overall image quality is good.  Nuclear Measurements Study was gated.  Rest Perfusion There is a defect present in the basal inferolateral and mid inferolateral location.  Stress Perfusion There is a defect present in the basal inferolateral and mid inferolateral location.  Perfusion Summary Defect 1:  There is a medium  defect of mild severity present in the basal inferolateral and mid inferolateral location. The defect is non-reversible.  Overall Study Impression Nuclear stress EF: 46%. The left ventricular ejection fraction is mildly decreased (45-54%).     EKG:  EKG is not ordered today.    Recent Labs: No results found for requested labs within last 8760 hours.  Recent Lipid Panel    Component Value Date/Time   CHOL 156 05/03/2017 0732   TRIG 150 (H) 05/03/2017 0732   HDL 46 05/03/2017 0732   CHOLHDL 3.4 05/03/2017 0732   CHOLHDL 4 12/24/2014 0802   VLDL 22.2 12/24/2014 0802   LDLCALC 80 05/03/2017 0732   LDLDIRECT 113.2 08/19/2011 0855    Physical Exam:    VS:  BP (!) 142/76   Pulse 65   Ht 5\' 5"  (1.651 m)   Wt 168 lb 12.8 oz (76.6 kg)   SpO2 96%   BMI 28.09 kg/m     Wt Readings from Last 3 Encounters:  09/02/18 168 lb 12.8 oz (76.6 kg)  09/01/18 170 lb (77.1 kg)  08/12/18 170 lb 9.6 oz (77.4 kg)     GEN:  Well nourished, well developed in no acute distress HEENT: Normal NECK: No JVD; No carotid bruits LYMPHATICS: No lymphadenopathy CARDIAC: RRR, no murmurs, rubs, gallops RESPIRATORY:  Clear to auscultation without rales, wheezing or rhonchi  ABDOMEN: Soft, non-tender, non-distended MUSCULOSKELETAL:  No edema; No deformity  SKIN: Warm and dry NEUROLOGIC:  Alert and oriented x 3 PSYCHIATRIC:  Normal affect   ASSESSMENT:    1. PAD (peripheral artery disease) ([place])   2. Coronary artery disease involving native coronary artery of native heart with angina pectoris ([place])   3. Claudication in peripheral vascular disease ([place])    PLAN:    In order of problems listed above:  1. The patient has severe lifestyle limiting claudication of the left calf without rest pain or ischemic ulceration.  His recent ABIs and Doppler study are reviewed and demonstrate occlusion of the left SFA stent (Viabahn inside of self-expanding nitinol stent).  I have discussed his case with Dr. Gwenlyn Found.   Considering the severity of his symptoms and drop in ABI now with stent occlusion, we agree that lower extremity angiography and possible endovascular treatment is indicated.  I have reviewed the risks, indications, and alternatives with the patient who agrees to proceed.  He will be started on clopidogrel 75 mg daily. 2. The patient has progressive symptoms of angina.  I reviewed his stress test which shows a fixed inferolateral defect without significant ischemia.  Despite that, his symptoms are fairly compelling for anginal chest pain.  He will be started on isosorbide 30 mg daily.  I recommended cardiac catheterization and possible PCI at the time of his lower extremity angiogram. I have reviewed the risks, indications, and alternatives to cardiac catheterization, possible angioplasty, and stenting with the patient. Risks include but are not limited to bleeding, infection, vascular injury, stroke, myocardial infection, arrhythmia, kidney injury, radiation-related injury in the case of prolonged fluoroscopy use, emergency cardiac surgery, and death. The patient understands the risks of serious complication is 1-2 in 4098 with diagnostic cardiac cath and 1-2% or less with angioplasty/stenting.  He understands to seek immediate medical attention if his symptoms progressed to resting chest pain.   Medication Adjustments/Labs and Tests Ordered: Current medicines are reviewed at length with the patient today.  Concerns regarding medicines are outlined above.  Orders Placed This Encounter  Procedures  . Basic metabolic panel  . CBC with Differential/Platelet   Meds ordered this encounter  Medications  . isosorbide mononitrate (IMDUR) 30 MG 24 hr tablet    Sig: Take 1 tablet (30 mg total) by mouth daily.    Dispense:  90 tablet    Refill:  3  . clopidogrel (PLAVIX) 75 MG tablet    Sig: Take 1 tablet (75 mg total) by mouth daily.    Dispense:  90 tablet    Refill:  3    Patient Instructions    Medication Instructions:  1) START ISOSORBIDE 30 mg daily 2) START PLAVIX 75 mg daily  Labwork: TODAY: BMET, CBC  Testing/Procedures: Your physician has requested that you have a cardiac catheterization. Cardiac catheterization is used to diagnose and/or treat various heart conditions. Doctors may recommend this procedure for a number of different reasons. The most common reason is to evaluate chest pain. Chest pain can be a symptom of coronary artery disease (CAD), and cardiac catheterization can show whether plaque is narrowing or blocking your heart's arteries. This procedure is also used to evaluate the valves, as well as measure the blood flow and oxygen levels in different parts of your heart. For further information please visit [email or web address]. Please follow instruction sheet, as given.   Dr. [name] recommends you have a LOWER EXTREMITY ANGIOGRAPHY.  Follow-Up: Your provider recommends that you schedule a follow-up appointment AS NEEDED with Dr. Gwenlyn Found pending study results.  Any Other Special Instructions Will Be Listed Below (If Applicable).    [facility] OFFICE [place], [place] [address] Dept: [phone] Loc: [place]  09/02/2018  You are scheduled for a Cardiac Catheterization and peripheral angiogram on Monday, November 18 with Dr. [name].  1. Please arrive at the [facility] (Main Entrance A) at [facility]: [address] [place],  [id number] at 5:30 AM (This time is two hours before your procedure to ensure your preparation). Free valet parking service is available.   Special note: Every effort is made to have your procedure done on time. Please understand that emergencies sometimes delay scheduled procedures.  2. Diet: Do not eat solid foods after midnight.  The patient may have clear liquids until 5am upon the day of the  procedure.  3. Labs:   4.  Medication instructions in preparation for your procedure:  1) TAKE ASPIRIN AND PLAVIX the morning of your procedure.  2) you may take your other medications as directed with sips of water.  5. Plan for one night stay--bring personal belongings. 6. Bring a current list of your medications and current insurance cards. 7. You MUST have a responsible person to drive you home. 8. Someone MUST be with you the first 24 hours after you arrive home or your discharge will be delayed. 9. Please wear clothes that are easy to get on and off and wear slip-on shoes.  Thank you for allowing [place] to care for you!   -- [facility] Invasive Cardiovascular services       Signed, [name], MD  09/02/2018 3:22 PM    [place]

## 2018-09-02 NOTE — H&P (View-Only) (Signed)
Cardiology Office Note:    Date:  09/02/2018   ID:  [name], DOB 10-24-1946, MRN [id number]  PCP:  Cassandria Anger, MD  Cardiologist:  No primary care provider on file.  Electrophysiologist:  None   Referring MD: Cassandria Anger, MD   Chief Complaint  Patient presents with  . Chest Pain  . Claudication   History of Present Illness:    Harold Waters is a 72 y.o. male with a hx of CAD and PAD, presenting for follow-up evaluation.   The patient is here alone today.  He is having trouble with left calf claudication.  He is noted progressive symptoms over the last several months.  The patient underwent atherectomy and Viabahn stenting of the left SFA in June 2018.  He had previously undergone left SFA stenting and 2011 but developed recurrent claudication and drop and left ABI.  In angiography he was found to have stent occlusion and he was treated accordingly as above.  The patient just underwent repeat Doppler studies yesterday demonstrating a drop in his ABI now down to 0.7 with ultrasound findings demonstrating total occlusion of his left SFA stent.  He denies rest pain, but he is experiencing left calf aching with short distance walking of less than 1 city block.  In addition, the patient has developed a burning discomfort in his chest that occurs with physical activity.  Symptoms are relieved with rest.  There is also associated shortness of breath.  No orthopnea, PND, or leg swelling.  He has not taken nitroglycerin. He denies resting chest pain or prolonged episodes of chest pain.  Past Medical History:  Diagnosis Date  . Angina   . Coronary atherosclerosis of unspecified type of vessel, native or graft   . Diverticulosis   . Diverticulosis   . DVT of lower extremity (deep venous thrombosis) ([place]) ~ 2010   LLE  . Erosive gastritis   . Essential hypertension, benign   . GERD (gastroesophageal reflux disease)   . Headache(784.0) 02/18/12   "lately"  . History of  bronchitis   . Internal hemorrhoids   . Lower back pain   . Myocardial infarction ([place]) 1997  . Osteoarthritis   . Other and unspecified hyperlipidemia   . PAD (peripheral artery disease) ([facility])    with ABI's 0.8 on the right and 0.86 on the left  . Pneumonia 1957  . PSVT (paroxysmal supraventricular tachycardia) ([place]) 02/18/12  . Shortness of breath    "lying down"  . Sleep apnea    does not wear c-pap    Past Surgical History:  Procedure Laterality Date  . APPENDECTOMY     hx of  . ARTHROSCOPY KNEE W/ DRILLING  ~ 2001   right  . COLONOSCOPY    . CORONARY ANGIOPLASTY WITH STENT PLACEMENT  1997   "2"  . ILIAC ARTERY STENT  04/22/2017   . Placement of a 6 mm x 100 mm Viabahn covered stent left SFA  . LEFT HEART CATHETERIZATION WITH CORONARY ANGIOGRAM N/A 02/19/2012   Procedure: LEFT HEART CATHETERIZATION WITH CORONARY ANGIOGRAM;  Surgeon: Larey Dresser, MD;  Location: [facility] CATH LAB;  Service: Cardiovascular;  Laterality: N/A;  . LOWER EXTREMITY ANGIOGRAPHY Bilateral 04/22/2017   Procedure: Lower Extremity Angiography;  Surgeon: [name], MD;  Location: [facility] CV LAB;  Service: Cardiovascular;  Laterality: Bilateral;  . PERIPHERAL ARTERIAL STENT GRAFT  09/2010   LLE  . PERIPHERAL VASCULAR ATHERECTOMY Left 04/22/2017   Procedure: Peripheral Vascular  Atherectomy;  Surgeon: [name], MD;  Location: [facility] CV LAB;  Service: Cardiovascular;  Laterality: Left;  SFA  . PERIPHERAL VASCULAR INTERVENTION Left 04/22/2017   Procedure: Peripheral Vascular Intervention;  Surgeon: [name], MD;  Location: [facility] CV LAB;  Service: Cardiovascular;  Laterality: Left;  SFA  . UPPER GASTROINTESTINAL ENDOSCOPY      Current Medications: Current Meds  Medication Sig  . aspirin EC 81 MG tablet Take 1 tablet (81 mg total) by mouth daily.  . Multiple Vitamins-Minerals (MENS MULTIVITAMIN PLUS) TABS Take 1 tablet by mouth at bedtime.   . rosuvastatin (CRESTOR) 40 MG  tablet Take 1 tablet (40 mg total) by mouth daily.     Allergies:   Eggs or egg-derived products   Social History   Socioeconomic History  . Marital status: Married    Spouse name: Not on file  . Number of children: 2  . Years of education: Not on file  . Highest education level: Not on file  Occupational History  . Occupation: retired  Scientific laboratory technician  . Financial resource strain: Not on file  . Food insecurity:    Worry: Not on file    Inability: Not on file  . Transportation needs:    Medical: Not on file    Non-medical: Not on file  Tobacco Use  . Smoking status: Former Smoker    Packs/day: 1.00    Years: 40.00    Pack years: 40.00    Types: Cigarettes    Last attempt to quit: 04/03/2010    Years since quitting: 8.4  . Smokeless tobacco: Never Used  Substance and Sexual Activity  . Alcohol use: Yes    Comment: "used to drink when I was young; quit ~ 1980's"  . Drug use: No  . Sexual activity: Yes  Lifestyle  . Physical activity:    Days per week: Not on file    Minutes per session: Not on file  . Stress: Not on file  Relationships  . Social connections:    Talks on phone: Not on file    Gets together: Not on file    Attends religious service: Not on file    Active member of club or organization: Not on file    Attends meetings of clubs or organizations: Not on file    Relationship status: Not on file  Other Topics Concern  . Not on file  Social History Narrative  . Not on file     Family History: The patient's family history includes Coronary artery disease in his brother; Ulcers in his father. There is no history of Colon cancer, Esophageal cancer, Rectal cancer, or Stomach cancer.  ROS:   Please see the history of present illness.    Positive for weight gain, hearing loss, visual disturbance, back pain, muscle pain, leg pain, wheezing, shortness of breath with activity, joint swelling.  All other systems reviewed and are negative.  EKGs/Labs/Other  Studies Reviewed:    The following studies were reviewed today: ABI 12-20-2017: Final Interpretation: Right: Resting right ankle-brachial index is within normal range. No evidence of significant right lower extremity arterial disease. The right toe-brachial index is abnormal. Left: Resting left ankle-brachial index is within normal range. No evidence of significant left lower extremity arterial disease. The left toe-brachial index is normal.  ABI 09-01-2018: Summary: Right: Resting right ankle-brachial index is within normal range. No evidence of significant right lower extremity arterial disease. The right toe-brachial index is normal.  Left:  Resting left ankle-brachial index indicates moderate left lower extremity arterial disease. The left toe-brachial index is abnormal.  Summary: Left: Severe progression is noted compared to previous study. Atherosclerosis in the common femoral, femoral and popliteal arteries The mid/distal SFA and stent appears to be occluded with reconstitution of flow at the above knee poplieal artery.   See table(s) above for measurements and observations.  Suggest follow up study in 6 months. Vascular consult recommended.  Myoview 09-01-2018: Nuclear Stress Findings   Isotope administration Rest isotope was administered with an IV injection of 9.6 mCi Tc80m Tetrofosmin. Rest SPECT images were obtained approximately 45 minutes post tracer injection. Stress isotope was administered with an IV injection of 30.6 mCi Tc61m Tetrofosmin Stress SPECT images were obtained approximately 60 minutes post tracer injection.  Nuclear Study Quality Overall image quality is good.  Nuclear Measurements Study was gated.  Rest Perfusion There is a defect present in the basal inferolateral and mid inferolateral location.  Stress Perfusion There is a defect present in the basal inferolateral and mid inferolateral location.  Perfusion Summary Defect 1:  There is a medium  defect of mild severity present in the basal inferolateral and mid inferolateral location. The defect is non-reversible.  Overall Study Impression Nuclear stress EF: 46%. The left ventricular ejection fraction is mildly decreased (45-54%).     EKG:  EKG is not ordered today.    Recent Labs: No results found for requested labs within last 8760 hours.  Recent Lipid Panel    Component Value Date/Time   CHOL 156 05/03/2017 0732   TRIG 150 (H) 05/03/2017 0732   HDL 46 05/03/2017 0732   CHOLHDL 3.4 05/03/2017 0732   CHOLHDL 4 12/24/2014 0802   VLDL 22.2 12/24/2014 0802   LDLCALC 80 05/03/2017 0732   LDLDIRECT 113.2 08/19/2011 0855    Physical Exam:    VS:  BP (!) 142/76   Pulse 65   Ht 5\' 5"  (1.651 m)   Wt 168 lb 12.8 oz (76.6 kg)   SpO2 96%   BMI 28.09 kg/m     Wt Readings from Last 3 Encounters:  09/02/18 168 lb 12.8 oz (76.6 kg)  09/01/18 170 lb (77.1 kg)  08/12/18 170 lb 9.6 oz (77.4 kg)     GEN:  Well nourished, well developed in no acute distress HEENT: Normal NECK: No JVD; No carotid bruits LYMPHATICS: No lymphadenopathy CARDIAC: RRR, no murmurs, rubs, gallops RESPIRATORY:  Clear to auscultation without rales, wheezing or rhonchi  ABDOMEN: Soft, non-tender, non-distended MUSCULOSKELETAL:  No edema; No deformity  SKIN: Warm and dry NEUROLOGIC:  Alert and oriented x 3 PSYCHIATRIC:  Normal affect   ASSESSMENT:    1. PAD (peripheral artery disease) ([place])   2. Coronary artery disease involving native coronary artery of native heart with angina pectoris ([place])   3. Claudication in peripheral vascular disease ([place])    PLAN:    In order of problems listed above:  1. The patient has severe lifestyle limiting claudication of the left calf without rest pain or ischemic ulceration.  His recent ABIs and Doppler study are reviewed and demonstrate occlusion of the left SFA stent (Viabahn inside of self-expanding nitinol stent).  I have discussed his case with Dr. Gwenlyn Found.   Considering the severity of his symptoms and drop in ABI now with stent occlusion, we agree that lower extremity angiography and possible endovascular treatment is indicated.  I have reviewed the risks, indications, and alternatives with the patient who agrees to proceed.  He will be started on clopidogrel 75 mg daily. 2. The patient has progressive symptoms of angina.  I reviewed his stress test which shows a fixed inferolateral defect without significant ischemia.  Despite that, his symptoms are fairly compelling for anginal chest pain.  He will be started on isosorbide 30 mg daily.  I recommended cardiac catheterization and possible PCI at the time of his lower extremity angiogram. I have reviewed the risks, indications, and alternatives to cardiac catheterization, possible angioplasty, and stenting with the patient. Risks include but are not limited to bleeding, infection, vascular injury, stroke, myocardial infection, arrhythmia, kidney injury, radiation-related injury in the case of prolonged fluoroscopy use, emergency cardiac surgery, and death. The patient understands the risks of serious complication is 1-2 in 1610 with diagnostic cardiac cath and 1-2% or less with angioplasty/stenting.  He understands to seek immediate medical attention if his symptoms progressed to resting chest pain.   Medication Adjustments/Labs and Tests Ordered: Current medicines are reviewed at length with the patient today.  Concerns regarding medicines are outlined above.  Orders Placed This Encounter  Procedures  . Basic metabolic panel  . CBC with Differential/Platelet   Meds ordered this encounter  Medications  . isosorbide mononitrate (IMDUR) 30 MG 24 hr tablet    Sig: Take 1 tablet (30 mg total) by mouth daily.    Dispense:  90 tablet    Refill:  3  . clopidogrel (PLAVIX) 75 MG tablet    Sig: Take 1 tablet (75 mg total) by mouth daily.    Dispense:  90 tablet    Refill:  3    Patient Instructions    Medication Instructions:  1) START ISOSORBIDE 30 mg daily 2) START PLAVIX 75 mg daily  Labwork: TODAY: BMET, CBC  Testing/Procedures: Your physician has requested that you have a cardiac catheterization. Cardiac catheterization is used to diagnose and/or treat various heart conditions. Doctors may recommend this procedure for a number of different reasons. The most common reason is to evaluate chest pain. Chest pain can be a symptom of coronary artery disease (CAD), and cardiac catheterization can show whether plaque is narrowing or blocking your heart's arteries. This procedure is also used to evaluate the valves, as well as measure the blood flow and oxygen levels in different parts of your heart. For further information please visit [email or web address]. Please follow instruction sheet, as given.   Dr. [name] recommends you have a LOWER EXTREMITY ANGIOGRAPHY.  Follow-Up: Your provider recommends that you schedule a follow-up appointment AS NEEDED with Dr. Gwenlyn Found pending study results.  Any Other Special Instructions Will Be Listed Below (If Applicable).    [facility] OFFICE [place], [place] [address] Dept: [phone] Loc: [place]  09/02/2018  You are scheduled for a Cardiac Catheterization and peripheral angiogram on Monday, November 18 with Dr. [name].  1. Please arrive at the [facility] (Main Entrance A) at [facility]: [address] [place], [place] [address] at 5:30 AM (This time is two hours before your procedure to ensure your preparation). Free valet parking service is available.   Special note: Every effort is made to have your procedure done on time. Please understand that emergencies sometimes delay scheduled procedures.  2. Diet: Do not eat solid foods after midnight.  The patient may have clear liquids until 5am upon the day of the  procedure.  3. Labs:   4.  Medication instructions in preparation for your procedure:  1) TAKE ASPIRIN AND PLAVIX the morning of your procedure.  2) you may take your other medications as directed with sips of water.  5. Plan for one night stay--bring personal belongings. 6. Bring a current list of your medications and current insurance cards. 7. You MUST have a responsible person to drive you home. 8. Someone MUST be with you the first 24 hours after you arrive home or your discharge will be delayed. 9. Please wear clothes that are easy to get on and off and wear slip-on shoes.  Thank you for allowing [place] to care for you!   -- [facility] Invasive Cardiovascular services       Signed, [name], MD  09/02/2018 3:22 PM    [place]

## 2018-09-07 ENCOUNTER — Encounter (HOSPITAL_COMMUNITY): Payer: PPO

## 2018-09-15 ENCOUNTER — Telehealth: Payer: Self-pay | Admitting: *Deleted

## 2018-09-15 NOTE — Telephone Encounter (Signed)
Left detailed message with instructions on voice mail (DPR), call  if questions.

## 2018-09-15 NOTE — Telephone Encounter (Signed)
Voicemail, no answer 

## 2018-09-15 NOTE — Telephone Encounter (Signed)
Pt contacted pre-procedures scheduled at [facility] for: Monday September 19, 2018 7:30 AM Verified arrival time and place: [place] Entrance A at: 5:30 AM  No solid food after midnight prior to cath, clear liquids until 5 AM day of procedure. Contrast allergy: no Verify no diabetes medications.  AM meds can be  taken pre-cath with sip of water including: ASA 81 mg Clopidogrel 75 mg  Confirm patient has responsible person to drive home post procedure and for 24 hours after you arrive home  LMTCB to review instructions with patient.

## 2018-09-19 ENCOUNTER — Encounter (HOSPITAL_COMMUNITY): Payer: Self-pay | Admitting: General Practice

## 2018-09-19 ENCOUNTER — Other Ambulatory Visit: Payer: Self-pay

## 2018-09-19 ENCOUNTER — Observation Stay (HOSPITAL_COMMUNITY)
Admission: RE | Admit: 2018-09-19 | Discharge: 2018-09-20 | Disposition: A | Discharge status: Home / Self Care | Payer: PPO | Source: Ambulatory Visit | Attending: Cardiovascular Disease | Admitting: Cardiovascular Disease

## 2018-09-19 ENCOUNTER — Encounter (HOSPITAL_COMMUNITY)
Admission: RE | Disposition: A | Discharge status: Home / Self Care | Payer: Self-pay | Source: Ambulatory Visit | Attending: Cardiovascular Disease

## 2018-09-19 ENCOUNTER — Other Ambulatory Visit: Payer: Self-pay | Admitting: *Deleted

## 2018-09-19 DIAGNOSIS — E118 Type 2 diabetes mellitus with unspecified complications: Secondary | ICD-10-CM | POA: Diagnosis present

## 2018-09-19 DIAGNOSIS — I714 Abdominal aortic aneurysm, without rupture: Secondary | ICD-10-CM | POA: Diagnosis not present

## 2018-09-19 DIAGNOSIS — Z95820 Peripheral vascular angioplasty status with implants and grafts: Secondary | ICD-10-CM | POA: Diagnosis not present

## 2018-09-19 DIAGNOSIS — Z87891 Personal history of nicotine dependence: Secondary | ICD-10-CM | POA: Insufficient documentation

## 2018-09-19 DIAGNOSIS — Z79899 Other long term (current) drug therapy: Secondary | ICD-10-CM | POA: Diagnosis not present

## 2018-09-19 DIAGNOSIS — I70212 Atherosclerosis of native arteries of extremities with intermittent claudication, left leg: Secondary | ICD-10-CM | POA: Diagnosis not present

## 2018-09-19 DIAGNOSIS — R0602 Shortness of breath: Secondary | ICD-10-CM | POA: Diagnosis not present

## 2018-09-19 DIAGNOSIS — I70219 Atherosclerosis of native arteries of extremities with intermittent claudication, unspecified extremity: Secondary | ICD-10-CM | POA: Diagnosis present

## 2018-09-19 DIAGNOSIS — I10 Essential (primary) hypertension: Secondary | ICD-10-CM | POA: Diagnosis not present

## 2018-09-19 DIAGNOSIS — I25119 Atherosclerotic heart disease of native coronary artery with unspecified angina pectoris: Secondary | ICD-10-CM | POA: Diagnosis not present

## 2018-09-19 DIAGNOSIS — I2510 Atherosclerotic heart disease of native coronary artery without angina pectoris: Secondary | ICD-10-CM | POA: Diagnosis not present

## 2018-09-19 DIAGNOSIS — I743 Embolism and thrombosis of arteries of the lower extremities: Secondary | ICD-10-CM | POA: Insufficient documentation

## 2018-09-19 DIAGNOSIS — Z9889 Other specified postprocedural states: Secondary | ICD-10-CM | POA: Insufficient documentation

## 2018-09-19 DIAGNOSIS — Z8249 Family history of ischemic heart disease and other diseases of the circulatory system: Secondary | ICD-10-CM | POA: Diagnosis not present

## 2018-09-19 DIAGNOSIS — Z955 Presence of coronary angioplasty implant and graft: Secondary | ICD-10-CM | POA: Diagnosis not present

## 2018-09-19 DIAGNOSIS — Z7902 Long term (current) use of antithrombotics/antiplatelets: Secondary | ICD-10-CM | POA: Insufficient documentation

## 2018-09-19 DIAGNOSIS — K219 Gastro-esophageal reflux disease without esophagitis: Secondary | ICD-10-CM | POA: Diagnosis not present

## 2018-09-19 DIAGNOSIS — G4730 Sleep apnea, unspecified: Secondary | ICD-10-CM | POA: Diagnosis not present

## 2018-09-19 DIAGNOSIS — Z01812 Encounter for preprocedural laboratory examination: Secondary | ICD-10-CM

## 2018-09-19 DIAGNOSIS — I252 Old myocardial infarction: Secondary | ICD-10-CM | POA: Insufficient documentation

## 2018-09-19 DIAGNOSIS — I739 Peripheral vascular disease, unspecified: Secondary | ICD-10-CM | POA: Diagnosis present

## 2018-09-19 DIAGNOSIS — E785 Hyperlipidemia, unspecified: Secondary | ICD-10-CM | POA: Insufficient documentation

## 2018-09-19 DIAGNOSIS — Z7982 Long term (current) use of aspirin: Secondary | ICD-10-CM | POA: Diagnosis not present

## 2018-09-19 DIAGNOSIS — I471 Supraventricular tachycardia: Secondary | ICD-10-CM | POA: Diagnosis not present

## 2018-09-19 DIAGNOSIS — Z8673 Personal history of transient ischemic attack (TIA), and cerebral infarction without residual deficits: Secondary | ICD-10-CM | POA: Diagnosis not present

## 2018-09-19 DIAGNOSIS — R079 Chest pain, unspecified: Secondary | ICD-10-CM | POA: Diagnosis present

## 2018-09-19 DIAGNOSIS — I70213 Atherosclerosis of native arteries of extremities with intermittent claudication, bilateral legs: Secondary | ICD-10-CM

## 2018-09-19 DIAGNOSIS — Z86718 Personal history of other venous thrombosis and embolism: Secondary | ICD-10-CM | POA: Insufficient documentation

## 2018-09-19 DIAGNOSIS — Z91012 Allergy to eggs: Secondary | ICD-10-CM | POA: Diagnosis not present

## 2018-09-19 HISTORY — DX: Personal history of urinary calculi: Z87.442

## 2018-09-19 HISTORY — PX: LEFT HEART CATH AND CORONARY ANGIOGRAPHY: CATH118249

## 2018-09-19 HISTORY — PX: LOWER EXTREMITY ANGIOGRAPHY: CATH118251

## 2018-09-19 HISTORY — PX: CARDIAC CATHETERIZATION: SHX172

## 2018-09-19 HISTORY — DX: Chronic gastric ulcer without hemorrhage or perforation: K25.7

## 2018-09-19 HISTORY — PX: INTRAVASCULAR PRESSURE WIRE/FFR STUDY: CATH118243

## 2018-09-19 LAB — POCT ACTIVATED CLOTTING TIME: Activated Clotting Time: 323 seconds

## 2018-09-19 SURGERY — LOWER EXTREMITY ANGIOGRAPHY
Anesthesia: LOCAL

## 2018-09-19 MED ORDER — ATORVASTATIN CALCIUM 80 MG PO TABS
80.0000 mg | ORAL_TABLET | Freq: Every day | ORAL | Status: DC
  Administered 2018-09-19: 80 mg via ORAL
  Filled 2018-09-19: qty 1

## 2018-09-19 MED ORDER — SODIUM CHLORIDE 0.9 % WEIGHT BASED INFUSION: 1.0000 mL/kg/h | INTRAVENOUS | Status: DC

## 2018-09-19 MED ORDER — MORPHINE SULFATE (PF) 2 MG/ML IV SOLN: 2.0000 mg | INTRAVENOUS | Status: DC | PRN

## 2018-09-19 MED ORDER — HEPARIN (PORCINE) IN NACL 1000-0.9 UT/500ML-% IV SOLN
INTRAVENOUS | Status: AC
  Filled 2018-09-19: qty 500

## 2018-09-19 MED ORDER — LIDOCAINE HCL (PF) 1 % IJ SOLN
INTRAMUSCULAR | Status: AC
  Filled 2018-09-19: qty 30

## 2018-09-19 MED ORDER — HYDRALAZINE HCL 20 MG/ML IJ SOLN: 10.0000 mg | INTRAMUSCULAR | Status: DC | PRN

## 2018-09-19 MED ORDER — SODIUM CHLORIDE 0.9 % IV SOLN: INTRAVENOUS | Status: AC

## 2018-09-19 MED ORDER — SODIUM CHLORIDE 0.9% FLUSH
3.0000 mL | Freq: Two times a day (BID) | INTRAVENOUS | Status: DC
  Administered 2018-09-19 – 2018-09-20 (×2): 3 mL via INTRAVENOUS

## 2018-09-19 MED ORDER — SODIUM CHLORIDE 0.9 % IV SOLN: 250.0000 mL | INTRAVENOUS | Status: DC | PRN

## 2018-09-19 MED ORDER — HEPARIN (PORCINE) IN NACL 1000-0.9 UT/500ML-% IV SOLN
INTRAVENOUS | Status: DC | PRN
  Administered 2018-09-19 (×2): 500 mL

## 2018-09-19 MED ORDER — SODIUM CHLORIDE 0.9 % WEIGHT BASED INFUSION
3.0000 mL/kg/h | INTRAVENOUS | Status: DC
  Administered 2018-09-19: 3 mL/kg/h via INTRAVENOUS

## 2018-09-19 MED ORDER — BIVALIRUDIN TRIFLUOROACETATE 250 MG IV SOLR
INTRAVENOUS | Status: AC
  Filled 2018-09-19: qty 250

## 2018-09-19 MED ORDER — MORPHINE SULFATE (PF) 10 MG/ML IV SOLN: 2.0000 mg | INTRAVENOUS | Status: DC | PRN

## 2018-09-19 MED ORDER — IODIXANOL 320 MG/ML IV SOLN
INTRAVENOUS | Status: DC | PRN
  Administered 2018-09-19: 85 mL via INTRAVENOUS

## 2018-09-19 MED ORDER — ASPIRIN 81 MG PO CHEW
81.0000 mg | CHEWABLE_TABLET | Freq: Every day | ORAL | Status: DC
  Administered 2018-09-19 – 2018-09-20 (×2): 81 mg via ORAL
  Filled 2018-09-19 (×2): qty 1

## 2018-09-19 MED ORDER — CLOPIDOGREL BISULFATE 75 MG PO TABS: 75.0000 mg | ORAL_TABLET | ORAL | Status: DC

## 2018-09-19 MED ORDER — HYDRALAZINE HCL 20 MG/ML IJ SOLN
INTRAMUSCULAR | Status: DC | PRN
  Administered 2018-09-19: 10 mg via INTRAVENOUS

## 2018-09-19 MED ORDER — ADENOSINE (DIAGNOSTIC) 140MCG/KG/MIN
INTRAVENOUS | Status: DC | PRN
  Administered 2018-09-19: 140 ug/kg/min via INTRAVENOUS

## 2018-09-19 MED ORDER — SODIUM CHLORIDE 0.9% FLUSH: 3.0000 mL | INTRAVENOUS | Status: DC | PRN

## 2018-09-19 MED ORDER — IOHEXOL 350 MG/ML SOLN
INTRAVENOUS | Status: DC | PRN
  Administered 2018-09-19: 25 mL via INTRA_ARTERIAL

## 2018-09-19 MED ORDER — LIDOCAINE HCL (PF) 1 % IJ SOLN
INTRAMUSCULAR | Status: DC | PRN
  Administered 2018-09-19: 30 mL via INTRADERMAL

## 2018-09-19 MED ORDER — ONDANSETRON HCL 4 MG/2ML IJ SOLN: 4.0000 mg | Freq: Four times a day (QID) | INTRAMUSCULAR | Status: DC | PRN

## 2018-09-19 MED ORDER — HYDRALAZINE HCL 20 MG/ML IJ SOLN
INTRAMUSCULAR | Status: AC
  Filled 2018-09-19: qty 1

## 2018-09-19 MED ORDER — SODIUM CHLORIDE 0.9 % IV SOLN
INTRAVENOUS | Status: DC | PRN
  Administered 2018-09-19: 1.75 mg/kg/h via INTRAVENOUS

## 2018-09-19 MED ORDER — BIVALIRUDIN BOLUS VIA INFUSION - CUPID
INTRAVENOUS | Status: DC | PRN
  Administered 2018-09-19: 57.525 mg via INTRAVENOUS

## 2018-09-19 MED ORDER — ACETAMINOPHEN 325 MG PO TABS: 650.0000 mg | ORAL_TABLET | ORAL | Status: DC | PRN

## 2018-09-19 MED ORDER — ASPIRIN 81 MG PO CHEW: 81.0000 mg | CHEWABLE_TABLET | ORAL | Status: DC

## 2018-09-19 MED ORDER — SODIUM CHLORIDE 0.9% FLUSH: 3.0000 mL | Freq: Two times a day (BID) | INTRAVENOUS | Status: DC

## 2018-09-19 SURGICAL SUPPLY — 25 items
CATH CROSS OVER TEMPO 5F (CATHETERS) ×1 IMPLANT
CATH INFINITI 5FR MULTPACK ANG (CATHETERS) ×1 IMPLANT
CATH MICROCATH NAVVUS (MICROCATHETER) IMPLANT
CATH SOFT-VU 4F 65 STRAIGHT (CATHETERS) IMPLANT
CATH SOFT-VU STRAIGHT 4F 65CM (CATHETERS) ×3
CATH VISTA GUIDE 6FR XBLAD3.5 (CATHETERS) ×1 IMPLANT
KIT ESSENTIALS PG (KITS) ×1 IMPLANT
KIT HEART LEFT (KITS) ×3 IMPLANT
KIT MICROPUNCTURE NIT STIFF (SHEATH) IMPLANT
KIT PV (KITS) ×3 IMPLANT
MICROCATHETER NAVVUS (MICROCATHETER) ×3
PACK CARDIAC CATHETERIZATION (CUSTOM PROCEDURE TRAY) ×3 IMPLANT
SHEATH PINNACLE 5F 10CM (SHEATH) ×1 IMPLANT
SHEATH PINNACLE 6F 10CM (SHEATH) ×1 IMPLANT
SHEATH PROBE COVER 6X72 (BAG) ×1 IMPLANT
STOPCOCK MORSE 400PSI 3WAY (MISCELLANEOUS) ×1 IMPLANT
SYR MEDRAD MARK 7 150ML (SYRINGE) ×1 IMPLANT
TRANSDUCER W/STOPCOCK (MISCELLANEOUS) ×3 IMPLANT
TRAY PV CATH (CUSTOM PROCEDURE TRAY) ×3 IMPLANT
TUBING CIL FLEX 10 FLL-RA (TUBING) ×3 IMPLANT
TUBING HIGH PRESSURE 120CM (CONNECTOR) ×1 IMPLANT
WIRE ASAHI PROWATER 180CM (WIRE) ×1 IMPLANT
WIRE EMERALD 3MM-J .035X150CM (WIRE) ×1 IMPLANT
WIRE HI TORQ VERSACORE-J 145CM (WIRE) ×2 IMPLANT
WIRE HITORQ VERSACORE ST 145CM (WIRE) ×1 IMPLANT

## 2018-09-19 NOTE — Interval H&P Note (Signed)
History and Physical Interval Note:  09/19/2018 7:51 AM  Harold Waters  has presented today for surgery, with the diagnosis of PAD/CAD  The various methods of treatment have been discussed with the patient and family. After consideration of risks, benefits and other options for treatment, the patient has consented to  Procedure(s): LOWER EXTREMITY ANGIOGRAPHY (Bilateral) LEFT HEART CATH AND CORONARY ANGIOGRAPHY (N/A) as a surgical intervention .  The patient's history has been reviewed, patient examined, no change in status, stable for surgery.  I have reviewed the patient's chart and labs.  Questions were answered to the patient's satisfaction.     [name]

## 2018-09-19 NOTE — Progress Notes (Signed)
Site area: rt groin fa sheath Site Prior to Removal:  Level 0 Pressure Applied For: 20 minutes Manual:   yes Patient Status During Pull:  stable Post Pull Site:  Level  0 Post Pull Instructions Given:  yes Post Pull Pulses Present: rt dp dopplered Dressing Applied:  Gauze and tegaderm Bedrest begins @ 9417 Comments:

## 2018-09-19 NOTE — Progress Notes (Signed)
Patient on regular diet order placed by MD. Dietary not able to see diet order. D/c regular diet and placed another order for regular diet to restart the system. Dietary able to see. Patient ordered dinner

## 2018-09-19 NOTE — Progress Notes (Signed)
Site without complication

## 2018-09-19 NOTE — Consult Note (Signed)
Inpatient Consult    Reason for Consult:  Left lower extremity claudication Referring Physician:  Dr. Gwenlyn Found MRN #:  [id number]  History of Present Illness: This is a 72 y.o. male with known coronary artery disease with previous stenting and also has short distance claudication with previous SFA stenting x2 on the left.  He does not have any right lower extremity problems.  He has not had wound issues with tissue loss or ulceration.  States he can only walk very short distance had issues even walking in here today due to cramping in his calf.  He underwent coronary artery angiography as well as lower extremity evaluation today by Dr. Gwenlyn Found.  He is recovering well now does not have any complaints today.  He is on aspirin Plavix and a statin.  Risk factors for vascular disease include hypertension and previous smoking status.  Past Medical History:  Diagnosis Date  . Angina   . Coronary atherosclerosis of unspecified type of vessel, native or graft   . Diverticulosis   . Diverticulosis   . DVT of lower extremity (deep venous thrombosis) ([place]) ~ 2010   LLE  . Erosive gastritis   . Essential hypertension, benign   . GERD (gastroesophageal reflux disease)   . Headache(784.0) 02/18/12   "lately"  . History of bronchitis   . Internal hemorrhoids   . Lower back pain   . Myocardial infarction ([place]) 1997  . Osteoarthritis   . Other and unspecified hyperlipidemia   . PAD (peripheral artery disease) ([facility])    with ABI's 0.8 on the right and 0.86 on the left  . Pneumonia 1957  . PSVT (paroxysmal supraventricular tachycardia) ([place]) 02/18/12  . Shortness of breath    "lying down"  . Sleep apnea    does not wear c-pap    Past Surgical History:  Procedure Laterality Date  . APPENDECTOMY     hx of  . ARTHROSCOPY KNEE W/ DRILLING  ~ 2001   right  . COLONOSCOPY    . CORONARY ANGIOPLASTY WITH STENT PLACEMENT  1997   "2"  . ILIAC ARTERY STENT  04/22/2017   . Placement of a 6 mm x 100 mm  Viabahn covered stent left SFA  . LEFT HEART CATHETERIZATION WITH CORONARY ANGIOGRAM N/A 02/19/2012   Procedure: LEFT HEART CATHETERIZATION WITH CORONARY ANGIOGRAM;  Surgeon: Larey Dresser, MD;  Location: [facility] CATH LAB;  Service: Cardiovascular;  Laterality: N/A;  . LOWER EXTREMITY ANGIOGRAPHY Bilateral 04/22/2017   Procedure: Lower Extremity Angiography;  Surgeon: [name], MD;  Location: [facility] CV LAB;  Service: Cardiovascular;  Laterality: Bilateral;  . PERIPHERAL ARTERIAL STENT GRAFT  09/2010   LLE  . PERIPHERAL VASCULAR ATHERECTOMY Left 04/22/2017   Procedure: Peripheral Vascular Atherectomy;  Surgeon: [name], MD;  Location: [facility] CV LAB;  Service: Cardiovascular;  Laterality: Left;  SFA  . PERIPHERAL VASCULAR INTERVENTION Left 04/22/2017   Procedure: Peripheral Vascular Intervention;  Surgeon: [name], MD;  Location: [facility] CV LAB;  Service: Cardiovascular;  Laterality: Left;  SFA  . UPPER GASTROINTESTINAL ENDOSCOPY      Allergies  Allergen Reactions  . Eggs Or Egg-Derived Products Itching    Prior to Admission medications   Medication Sig Start Date End Date Taking? Authorizing Provider  aspirin EC 81 MG tablet Take 1 tablet (81 mg total) by mouth daily. Patient taking differently: Take 81 mg by mouth at bedtime.  04/23/17  Yes Cheryln Manly, NP  clopidogrel (PLAVIX) 75  MG tablet Take 1 tablet (75 mg total) by mouth daily. Patient taking differently: Take 75 mg by mouth daily at 3 pm.  09/02/18  Yes [name], MD  isosorbide mononitrate (IMDUR) 30 MG 24 hr tablet Take 1 tablet (30 mg total) by mouth daily. Patient taking differently: Take 30 mg by mouth daily at 3 pm.  09/02/18 08/28/19 Yes [name], MD  Multiple Vitamins-Minerals (MENS MULTIVITAMIN PLUS) TABS Take 1 tablet by mouth at bedtime.    Yes [provider]  rosuvastatin (CRESTOR) 40 MG tablet Take 1 tablet (40 mg total) by mouth daily. Patient taking  differently: Take 40 mg by mouth at bedtime.  07/21/18  Yes [name], MD    Social History   Socioeconomic History  . Marital status: Married    Spouse name: Not on file  . Number of children: 2  . Years of education: Not on file  . Highest education level: Not on file  Occupational History  . Occupation: retired  Scientific laboratory technician  . Financial resource strain: Not on file  . Food insecurity:    Worry: Not on file    Inability: Not on file  . Transportation needs:    Medical: Not on file    Non-medical: Not on file  Tobacco Use  . Smoking status: Former Smoker    Packs/day: 1.00    Years: 40.00    Pack years: 40.00    Types: Cigarettes    Last attempt to quit: 04/03/2010    Years since quitting: 8.4  . Smokeless tobacco: Never Used  Substance and Sexual Activity  . Alcohol use: Yes    Comment: "used to drink when I was young; quit ~ 1980's"  . Drug use: No  . Sexual activity: Yes  Lifestyle  . Physical activity:    Days per week: Not on file    Minutes per session: Not on file  . Stress: Not on file  Relationships  . Social connections:    Talks on phone: Not on file    Gets together: Not on file    Attends religious service: Not on file    Active member of club or organization: Not on file    Attends meetings of clubs or organizations: Not on file    Relationship status: Not on file  . Intimate partner violence:    Fear of current or ex partner: Not on file    Emotionally abused: Not on file    Physically abused: Not on file    Forced sexual activity: Not on file  Other Topics Concern  . Not on file  Social History Narrative  . Not on file     Family History  Problem Relation Age of Onset  . Ulcers Father        had stomach issues, not sure what happened  . Coronary artery disease Brother        male 1st degree relative <50  . Colon cancer Neg Hx   . Esophageal cancer Neg Hx   . Rectal cancer Neg Hx   . Stomach cancer Neg Hx     ROS: [x]   Positive   [ ]  Negative   [ ]  All sytems reviewed and are negative  Cardiovascular: [x]  chest pain/pressure []  palpitations []  SOB lying flat []  DOE [x]  pain in legs while walking []  pain in legs at rest []  pain in legs at night []  non-healing ulcers []  hx of DVT []  swelling in legs  Pulmonary: []  productive cough []  asthma/wheezing []  home O2  Neurologic: []  weakness in []  arms []  legs []  numbness in []  arms []  legs []  hx of CVA []  mini stroke [] difficulty speaking or slurred speech []  temporary loss of vision in one eye []  dizziness  Hematologic: []  hx of cancer []  bleeding problems []  problems with blood clotting easily  Endocrine:   []  diabetes []  thyroid disease  GI []  vomiting blood []  blood in stool  GU: []  CKD/renal failure []  HD--[]  M/W/F or []  T/T/S []  burning with urination []  blood in urine  Psychiatric: []  anxiety []  depression  Musculoskeletal: []  arthritis []  joint pain  Integumentary: []  rashes []  ulcers  Constitutional: []  fever []  chills   Physical Examination  Vitals:   09/19/18 0549 09/19/18 0925  BP: (!) 163/78 125/68  Pulse: 70 77  Resp: 20 14  Temp: (!) 97.5 F (36.4 C)   SpO2: 98% 94%   Body mass index is 28.12 kg/m.  General:  NAD HENT: WNL, normocephalic Pulmonary: normal non-labored breathing Cardiac: Palpable femoral pulses bilaterally There is no palpable popliteal pulse on the left and his left foot is warm Right popliteal posterior tibial pulses are palpable Abdomen:  soft, NT/ND, no masses Extremities: No tissue loss, no hematoma from recent endovascular evaluation Musculoskeletal: no muscle wasting or atrophy  Neurologic: A&O X 3; SENSATION: normal; MOTOR FUNCTION:  moving all extremities equally. Speech is fluent/normal Psychiatric: Appropriate mood and affect  CBC    Component Value Date/Time   WBC 5.6 09/02/2018 1151   WBC 7.9 04/23/2017 0347   RBC 4.90 09/02/2018 1151   RBC 4.71 04/23/2017  0347   HGB 14.1 09/02/2018 1151   HCT 42.6 09/02/2018 1151   PLT 258 09/02/2018 1151   MCV 87 09/02/2018 1151   MCH 28.8 09/02/2018 1151   MCH 28.2 04/23/2017 0347   MCHC 33.1 09/02/2018 1151   MCHC 32.0 04/23/2017 0347   RDW 14.2 09/02/2018 1151   LYMPHSABS 1.7 09/02/2018 1151   MONOABS 0.4 12/24/2014 0802   EOSABS 0.7 (H) 09/02/2018 1151   BASOSABS 0.0 09/02/2018 1151    BMET    Component Value Date/Time   NA 142 09/02/2018 1151   K 4.9 09/02/2018 1151   CL 100 09/02/2018 1151   CO2 25 09/02/2018 1151   GLUCOSE 102 (H) 09/02/2018 1151   GLUCOSE 164 (H) 04/23/2017 0347   BUN 20 09/02/2018 1151   CREATININE 1.22 09/02/2018 1151   CALCIUM 9.6 09/02/2018 1151   GFRNONAA 59 (L) 09/02/2018 1151   GFRAA 68 09/02/2018 1151    COAGS: Lab Results  Component Value Date   INR 1.0 04/14/2017   INR 0.97 02/19/2012   INR 0.90 09/17/2010     Non-Invasive Vascular Imaging:   ABI: 1.0/0.72   ASSESSMENT/PLAN: This is a 72 y.o. male with left lower extremity short distance life limiting claudication.  Has undergone coronary angiogram as well as left lower extremity angiogram today.  I discussed with him that he would really not be a candidate for any further endovascular intervention on the left given previous stenting x2 that is failed.  From this standpoint he would need bypass surgery.  I discussed with him the surgery but he is to see Dr. [name] for further clearance.  I will see him back in a few weeks to discuss surgery and have vein mapping performed in our office.  He does demonstrate good understanding.  If he has further issues I be happy to  see him sooner or schedule him for left femoral to likely below-knee popliteal artery bypass without further evaluation although he would need vein mapping prior.     Vetta Couzens C. Donzetta Matters, MD [facility] Office: [phone] Pager: [phone]

## 2018-09-20 ENCOUNTER — Encounter (HOSPITAL_COMMUNITY): Payer: Self-pay | Admitting: Cardiovascular Disease

## 2018-09-20 ENCOUNTER — Telehealth: Payer: Self-pay | Admitting: *Deleted

## 2018-09-20 DIAGNOSIS — I10 Essential (primary) hypertension: Secondary | ICD-10-CM

## 2018-09-20 DIAGNOSIS — I70212 Atherosclerosis of native arteries of extremities with intermittent claudication, left leg: Principal | ICD-10-CM

## 2018-09-20 DIAGNOSIS — R079 Chest pain, unspecified: Secondary | ICD-10-CM | POA: Diagnosis present

## 2018-09-20 DIAGNOSIS — I25118 Atherosclerotic heart disease of native coronary artery with other forms of angina pectoris: Secondary | ICD-10-CM

## 2018-09-20 MED ORDER — ROSUVASTATIN CALCIUM 40 MG PO TABS: 40.0000 mg | ORAL_TABLET | Freq: Every day | ORAL | Status: DC

## 2018-09-20 MED ORDER — CARVEDILOL 3.125 MG PO TABS
3.1250 mg | ORAL_TABLET | Freq: Two times a day (BID) | ORAL | Status: DC
  Administered 2018-09-20: 3.125 mg via ORAL
  Filled 2018-09-20: qty 1

## 2018-09-20 MED ORDER — CLOPIDOGREL BISULFATE 75 MG PO TABS: 75.0000 mg | ORAL_TABLET | Freq: Every day | ORAL | Status: DC

## 2018-09-20 MED ORDER — CARVEDILOL 3.125 MG PO TABS: 3.1250 mg | ORAL_TABLET | Freq: Two times a day (BID) | ORAL | 4 refills | Status: DC

## 2018-09-20 NOTE — Care Management CC44 (Signed)
Condition Code 44 Documentation Completed  Patient Details  Name: Harold Waters MRN: [id number] Date of Birth: [birth year]   Condition Code 44 given:  Yes Patient signature on Condition Code 44 notice:  Yes Documentation of 2 MD's agreement:  Yes Code 44 added to claim:  Yes    Royston Bake, RN 09/20/2018, 10:19 AM

## 2018-09-20 NOTE — Telephone Encounter (Signed)
Pt was on TCM report admitted 11/*18/19 for Atherosclerosis of native artery of extremity with intermittent claudication . Pt had a Cardiac catheterization and Peripheral vascular angiogram. Patient was seen and examined by Dr. Ellyn Hack who feels that he is stable and ready for discharge on 09/20/18. Cath site unremarkable. VVS.will follow up  appointment with Richardson Dopp on 10/18/18 at 245pm.../lmb

## 2018-09-20 NOTE — Plan of Care (Signed)
  Problem: Education: Goal: Knowledge of General Education information will improve Description Including pain rating scale, medication(s)/side effects and non-pharmacologic comfort measures Outcome: Progressing   Problem: Health Behavior/Discharge Planning: Goal: Ability to manage health-related needs will improve Outcome: Progressing   

## 2018-09-20 NOTE — Progress Notes (Signed)
  Progress Note    09/20/2018 1:26 PM 1 Day Post-Op  Subjective: No overnight issues  Vitals:   09/20/18 0440 09/20/18 0908  BP: 135/85 134/75  Pulse: 72 76  Resp: 18 18  Temp: 97.8 F (36.6 C) 98 F (36.7 C)  SpO2: 93% 94%    Physical Exam: Awake alert oriented Bilateral common femoral pulses are palpable Palpable right posterior tibial pulse No wounds on left foot  CBC    Component Value Date/Time   WBC 5.6 09/02/2018 1151   WBC 7.9 04/23/2017 0347   RBC 4.90 09/02/2018 1151   RBC 4.71 04/23/2017 0347   HGB 14.1 09/02/2018 1151   HCT 42.6 09/02/2018 1151   PLT 258 09/02/2018 1151   MCV 87 09/02/2018 1151   MCH 28.8 09/02/2018 1151   MCH 28.2 04/23/2017 0347   MCHC 33.1 09/02/2018 1151   MCHC 32.0 04/23/2017 0347   RDW 14.2 09/02/2018 1151   LYMPHSABS 1.7 09/02/2018 1151   MONOABS 0.4 12/24/2014 0802   EOSABS 0.7 (H) 09/02/2018 1151   BASOSABS 0.0 09/02/2018 1151    BMET    Component Value Date/Time   NA 142 09/02/2018 1151   K 4.9 09/02/2018 1151   CL 100 09/02/2018 1151   CO2 25 09/02/2018 1151   GLUCOSE 102 (H) 09/02/2018 1151   GLUCOSE 164 (H) 04/23/2017 0347   BUN 20 09/02/2018 1151   CREATININE 1.22 09/02/2018 1151   CALCIUM 9.6 09/02/2018 1151   GFRNONAA 59 (L) 09/02/2018 1151   GFRAA 68 09/02/2018 1151    INR    Component Value Date/Time   INR 1.0 04/14/2017 1105     Intake/Output Summary (Last 24 hours) at 09/20/2018 1326 Last data filed at 09/20/2018 0753 Gross per 24 hour  Intake 840 ml  Output 0 ml  Net 840 ml     Assessment/plan:  72 y.o. male is with severe life limiting left lower extremity claudication and SFA occlusion with 2 previous attempts at stenting.  He is now indicated for bypass from femoral to popliteal artery.  He has been evaluated with coronary angiography for angina and will be treated medically.  We will plan to see him in a few weeks in the office with vein mapping and to plan surgery.   Kaleeya Hancock C.  Donzetta Matters, MD [facility] Office: [phone] Pager: [phone]  09/20/2018 1:26 PM

## 2018-09-20 NOTE — Discharge Summary (Addendum)
Discharge Summary    Patient ID: Harold Waters MRN: [id number]; DOB: Apr 27, 1946  Admit date: 09/19/2018 Discharge date: 09/20/2018  Primary Care Provider: Cassandria Anger, MD  Primary Cardiologist: [name], MD   Discharge Diagnoses    Principal Problem:   Atherosclerosis of native artery of extremity with intermittent claudication (HCC) Active Problems:   HYPERTENSION, MILD   Coronary atherosclerosis   CAD (coronary artery disease)   Diabetes mellitus type 2 with complications (HCC)   Claudication in peripheral vascular disease (HCC)  Allergies Allergies  Allergen Reactions  . Eggs Or Egg-Derived Products Itching   Diagnostic Studies/Procedures    Cardiac catheterization 09/19/18:   Prox RCA to Mid RCA lesion is 90% stenosed.  Ost 2nd Mrg to 2nd Mrg lesion is 99% stenosed.  Prox LAD lesion is 60% stenosed.  Ost 1st Diag lesion is 50% stenosed.   Peripheral vascular angiogram 09/19/18:  1: Abdominal aorta- small infrarenal abdominal aortic aneurysm above the iliac bifurcation 2: Left lower extremity- the mid left SFA is chronically occluded at the site of prior stenting with reconstitution of the above-the-knee popliteal by profunda femoris collaterals in the adductor canal.  There was three-vessel runoff with segmental disease in the posterior tibial artery.  IMPRESSION: [name] has three-vessel coronary disease with a high-grade nondominant RCA stent restenosis, high-grade second marginal branch in-stent restenosis, and moderate LAD diagonal branch bifurcation disease with an FFR of 0.82 suggesting borderline hemodynamic significance.  In addition, his left SFA stent is occluded above and below with three-vessel runoff and a highly diseased left posterior tibial artery.  I believe his coronary disease can be treated medically at this time and he will need left femoropopliteal bypass grafting.  I have discussed this with Drs. Cooper and Dr. Donzetta Matters.     History of Present Illness     Harold Waters is a 72 y.o. male with a hx of CAD and PAD who initially presented to the office for follow up with Dr. [name] on 09/02/18. During that time, he had complaints of left calf claudication which had been progressing over the last several months. In addition, the patient reported developed a burning discomfort in his chest that occurs with physical activity.  Symptoms are relieved with rest.  He also had associated shortness of breath. No orthopnea, PND, or leg swelling. He underwent atherectomy and Viabahn stenting of the left SFA in 04/2017. He had previously undergone left SFA stenting in 2011 but developed recurrent claudication and drop and left ABI. Angiography was found to have stent occlusion and he was treated accordingly as above. The patient recently underwent repeat Doppler studies demonstrating a drop in his ABI now down to 0.7 with ultrasound findings demonstrating total occlusion of his left SFA stent. He denied rest pain, but he was experiencing left calf aching with short distance walking of less than one city block. He was referred to Dr. Gwenlyn Found for further evaluation with cardiac cath and peripheral angiography.    Hospital Course     He was referred to Dr. Gwenlyn Found for further evaluation of his CAD and PAD with intravascular pressure wire with FFR, lower extremity angiography and left heart catheterization completed on 09/19/18. The above reveled three-vessel coronary disease with a high-grade nondominant RCA stent restenosis, high-grade second marginal branch in-stent restenosis, and moderate LAD diagonal branch bifurcation disease with an FFR of 0.82 suggesting borderline hemodynamic significance. In addition, his left SFA stent is occluded above and below with three-vessel runoff and a highly  diseased left posterior tibial artery. Per Dr. Kennon Holter report, his coronary disease could be treated medically at this time, however he will need left  femoropopliteal bypass grafting. Dr. Donzetta Matters with [facility] was contatced for further evaluation. He will need an appointment to be seen by [facility] for surgical intervention.   Medication changes: -Stop Plavix in anticipation of surgery  -Add low dose carvedilol 3.125mg  twice daily    General: Well developed, well nourished, NAD Skin: Warm, dry, intact  Head: Normocephalic, atraumatic, clear, moist mucus membranes. Lungs: Clear to ausculation bilaterally. No wheezes, rales, or rhonchi. Breathing is unlabored. Cardiovascular: RRR with S1 S2. No murmurs, rubs, gallops, or LV heave appreciated. Abdomen: Soft, non-tender, non-distended with normoactive bowel sounds. No obvious abdominal masses. MSK: Strength and tone appear normal for age. 5/5 in all extremities Extremities: No edema. No clubbing or cyanosis. DP/PT pulses 2+ bilaterally; right groin cath site CDI.  Mild tenderness but no hematoma. Neuro: Alert and oriented. No focal deficits. No facial asymmetry. MAE spontaneously. Psych: Responds to questions appropriately with normal affect.    Consultants: [facility], Dr. Donzetta Matters   The patient was seen and examined by Dr. Ellyn Hack who feels that he is stable and ready for discharge on 09/20/18. Cath site unremarkable. [facility]. He has a follow up appointment with Richardson Dopp on 10/18/18 at 245pm.   Discharge Vitals Blood pressure 135/85, pulse 72, temperature 97.8 F (36.6 C), temperature source Oral, resp. rate 18, height 5\' 5"  (1.651 m), weight 76.3 kg, SpO2 93 %.  Filed Weights   09/19/18 0549 09/19/18 1633 09/20/18 0440  Weight: 76.7 kg 78.7 kg 76.3 kg   Labs & Radiologic Studies    Vas [place] Abi With/wo Tbi  Result Date: 09/01/2018 LOWER EXTREMITY DOPPLER STUDY Indications: Patient complains of left leg pain when walking for about 1/2              block. This has been an ongoing issue for about six months. High Risk Factors: Hypertension, hyperlipidemia, past history of smoking,                    coronary  artery disease.  Vascular Interventions: 04/22/17 PTA left superficial femoral artery CTO,Hawk 1                         directional atherectomy and placement of 6 mm x 100 mm                         Viabahn covered stent left superficial femoral artery. Comparison Study: In 11/2017, an arterial Doppler showed a right ABI of 1.06 and                   a left ABI of 1.04. Performing Technologist: Wilkie Aye RVT  Examination Guidelines: A complete evaluation includes at minimum, Doppler waveform signals and systolic blood pressure reading at the level of bilateral brachial, anterior tibial, and posterior tibial arteries, when vessel segments are accessible. Bilateral testing is considered an integral part of a complete examination. Photoelectric Plethysmograph (PPG) waveforms and toe systolic pressure readings are included as required and additional duplex testing as needed. Limited examinations for reoccurring indications may be performed as noted.  ABI Findings: +---------+------------------+-----+---------+--------+ Right    Rt Pressure (mmHg)IndexWaveform Comment  +---------+------------------+-----+---------+--------+ Brachial 164                                      +---------+------------------+-----+---------+--------+  ATA      171               1.04 triphasic         +---------+------------------+-----+---------+--------+ PTA      183               1.12 triphasic         +---------+------------------+-----+---------+--------+ PERO     172               1.05 triphasic         +---------+------------------+-----+---------+--------+ Great Toe151               0.92 Normal            +---------+------------------+-----+---------+--------+ +---------+------------------+-----+----------+-------+ Left     Lt Pressure (mmHg)IndexWaveform  Comment +---------+------------------+-----+----------+-------+ Brachial 164                                       +---------+------------------+-----+----------+-------+ ATA      108               0.66 monophasic        +---------+------------------+-----+----------+-------+ PTA      118               0.72 monophasic        +---------+------------------+-----+----------+-------+ PERO     91                0.55 monophasic        +---------+------------------+-----+----------+-------+ Great Toe103               0.63 Abnormal          +---------+------------------+-----+----------+-------+ +-------+-----------+-----------+------------+------------+ ABI/TBIToday's ABIToday's TBIPrevious ABIPrevious TBI +-------+-----------+-----------+------------+------------+ Right  1.12       0.92       1.06        0.68         +-------+-----------+-----------+------------+------------+ Left   0.72       0.63       1.04        0.70         +-------+-----------+-----------+------------+------------+ Right ABIs appear essentially unchanged compared to prior study on 11/2017. Left ABIs appear essentially unchanged compared to prior study on 11/2017.  Summary: Right: Resting right ankle-brachial index is within normal range. No evidence of significant right lower extremity arterial disease. The right toe-brachial index is normal. Left: Resting left ankle-brachial index indicates moderate left lower extremity arterial disease. The left toe-brachial index is abnormal.  *See table(s) above for measurements and observations.  Suggest follow up study in 6 months. Vascular consult recommended. Electronically signed by Ida Rogue MD on 09/01/2018 at 9:30:05 PM.    Final    Vas [place] Lower Extremity Arterial Duplex  Result Date: 09/01/2018 LOWER EXTREMITY ARTERIAL DUPLEX STUDY Indications: Patient complains of left leg pain when walking for about 1/2              block. This has been an ongoing issue for about six months.  Vascular Interventions: 04/22/17 PTA left superficial femoral artery CTO,Hawk 1                          directional atherectomy and placement of 6 mm x 100 mm                         Viabahn  covered stent left superficial femoral artery. Current ABI:            Today the right ABI was 1.12 and the left 0.72 Comparison Study: In 11/2017, an arterial duplex showed a 50-74% stenosis in the                   left mid SFA and a patent stent. Performing Technologist: Wilkie Aye RVT  Examination Guidelines: A complete evaluation includes B-mode imaging, spectral Doppler, color Doppler, and power Doppler as needed of all accessible portions of each vessel. Bilateral testing is considered an integral part of a complete examination. Limited examinations for reoccurring indications may be performed as noted.  Left Duplex Findings: +----------+--------+-----+--------+----------+--------+           PSV cm/sRatioStenosisWaveform  Comments +----------+--------+-----+--------+----------+--------+ CFA Prox  97                   biphasic           +----------+--------+-----+--------+----------+--------+ CFA Distal78                   biphasic           +----------+--------+-----+--------+----------+--------+ DFA       89                   biphasic           +----------+--------+-----+--------+----------+--------+ SFA Prox  44                   biphasic           +----------+--------+-----+--------+----------+--------+ SFA Mid   21                   biphasic           +----------+--------+-----+--------+----------+--------+ SFA Distal0            occluded                   +----------+--------+-----+--------+----------+--------+ POP Prox  22                   monophasic         +----------+--------+-----+--------+----------+--------+ POP Distal31                   monophasic         +----------+--------+-----+--------+----------+--------+  Left Stent(s): +--------------+--------+--------+--------+--------+ mid/distal SFAPSV cm/sStenosisWaveformComments  +--------------+--------+--------+--------+--------+ Proximal Stent        occluded                 +--------------+--------+--------+--------+--------+ Mid Stent             occluded                 +--------------+--------+--------+--------+--------+ Distal Stent          occluded                 +--------------+--------+--------+--------+--------+  Summary: Left: Severe progression is noted compared to previous study. Atherosclerosis in the common femoral, femoral and popliteal arteries The mid/distal SFA and stent appears to be occluded with reconstitution of flow at the above knee poplieal artery.  See table(s) above for measurements and observations. Suggest follow up study in 6 months. Vascular consult recommended. Electronically signed by Ida Rogue MD on 09/01/2018 at 9:49:58 PM.    Final    Disposition   Pt is being discharged home today in good condition.  Follow-up Plans & Appointments   Follow-up Information  Richardson Dopp T, PA-C Follow up on 10/18/2018.   Specialties:  Cardiology, Physician Assistant Why:  Your follow up appointment will be on 10/18/18 at 245pm. Please arrive by 230pm.  Contact information: [address] [place] [address] [phone]          Discharge Instructions    Call MD for:  difficulty breathing, headache or visual disturbances   Complete by:  As directed    Call MD for:  extreme fatigue   Complete by:  As directed    Call MD for:  persistant dizziness or light-headedness   Complete by:  As directed    Call MD for:  persistant nausea and vomiting   Complete by:  As directed    Call MD for:  redness, tenderness, or signs of infection (pain, swelling, redness, odor or green/yellow discharge around incision site)   Complete by:  As directed    Call MD for:  severe uncontrolled pain   Complete by:  As directed    Call MD for:  temperature >100.4   Complete by:  As directed    Diet - low sodium heart  healthy   Complete by:  As directed    Discharge instructions   Complete by:  As directed    No driving for 3 days. No lifting over 5 lbs for 1 week. No sexual activity for 1 week. Keep procedure site clean & dry. If you notice increased pain, swelling, bleeding or pus, call/return!  You may shower, but no soaking baths/hot tubs/pools for 1 week.   Increase activity slowly   Complete by:  As directed      Discharge Medications   Allergies as of 09/20/2018      Reactions   Eggs Or Egg-derived Products Itching      Medication List    STOP taking these medications   clopidogrel 75 MG tablet Commonly known as:  PLAVIX     TAKE these medications   aspirin EC 81 MG tablet Take 1 tablet (81 mg total) by mouth daily. What changed:  when to take this   carvedilol 3.125 MG tablet Commonly known as:  COREG Take 1 tablet (3.125 mg total) by mouth 2 (two) times daily with a meal.   isosorbide mononitrate 30 MG 24 hr tablet Commonly known as:  IMDUR Take 1 tablet (30 mg total) by mouth daily. What changed:  when to take this   MENS MULTIVITAMIN PLUS Tabs Take 1 tablet by mouth at bedtime.   rosuvastatin 40 MG tablet Commonly known as:  CRESTOR Take 1 tablet (40 mg total) by mouth daily. What changed:  when to take this       Acute coronary syndrome (MI, NSTEMI, STEMI, etc) this admission?: No.    Outstanding Labs/Studies   Pre surgical evaluation  Duration of Discharge Encounter   Greater than 30 minutes including physician time.  Signed, Kathyrn Drown, NP 09/20/2018, 8:44 AM  I have seen, examined and evaluated the patient this AM along with Kathyrn Drown, NP.  After reviewing all the available data and chart, we discussed the patients laboratory, study & physical findings as well as symptoms in detail. I agree with her findings, examination as well as summary and recommendations as per our discussion.    Stable post catheterization.  Moderate to severe CAD was only  on Imdur and aspirin plus statin and Plavix.  We will add carvedilol. Holding Plavix pending vascular surgery, however Dr. Donzetta Matters would be okay  with continuing if there is concern. In the absence of active angina, I think we can hold. Blood pressure looks stable should be a tolerate low-dose carvedilol.  Preoperative evaluation for femoropopliteal bypass.  Intermediate risk surgery and the patient is low to intermediate risk.  Adding beta-blocker reduces risk of adverse cardiac outcomes. He does have coronary disease, but by FFR nonobstructive.  We will be available for assistance if necessary perioperatively, but no further cardiac evaluation is necessary.  Stable for discharge.    Glenetta Hew, M.D., M.S. Interventional Cardiologist   Pager # [phone] Phone # [phone] [address] [place], [place] [address]

## 2018-09-20 NOTE — Progress Notes (Signed)
Patient ready for discharge. 

## 2018-09-20 NOTE — Care Management Obs Status (Signed)
[facility] NOTIFICATION   Patient Details  Name: BALDWIN RACICOT MRN: [id number] Date of Birth: June 01, 1946   Medicare Observation Status Notification Given:  Yes    Royston Bake, RN 09/20/2018, 10:19 AM

## 2018-09-21 ENCOUNTER — Telehealth: Payer: Self-pay | Admitting: Cardiovascular Disease

## 2018-09-21 ENCOUNTER — Telehealth: Payer: Self-pay | Admitting: Vascular Surgery

## 2018-09-21 NOTE — Telephone Encounter (Signed)
New Message           Patient' daughter is calling today with questions. The patient is confused about who his doctor is and they are concerned about the blood thinner being taken away. Pls call to advise.

## 2018-09-21 NOTE — Telephone Encounter (Signed)
Sch appt spk to pt mld ltr 10/14/18 11am Saphenous Vein mapping 1145am f/u MD

## 2018-09-21 NOTE — Telephone Encounter (Signed)
New Message          Patient would like a call back concerning medicatio

## 2018-09-21 NOTE — Telephone Encounter (Signed)
-----   Message from Mena Goes, RN sent at 09/19/2018  3:49 PM EST ----- Regarding: 2-6 [place] MAP (ORDER IN EPIC)    ----- Message ----- From: Waynetta Sandy, MD Sent: 09/19/2018   3:43 PM EST To: [facility] Pool, [facility] Charge [address]  Harold Waters [id number] [birth year]  Hospital level 3 consult Dx: pad with life-limiting claudication  F/u with me in 2-6 weeks with bilateral saphenous vein mapping to plan left femoral to popliteal artery bypass grafting

## 2018-09-21 NOTE — Telephone Encounter (Signed)
REVIEWED WITH DR BERRY, V/O  PER DR BERRY PATIENT NEEDS AN APPOINTMENT WITH DR COOPER  IN THE NEXT 7 TO 10 DAYS. TO GO OVER  RESULTS OF CARDIAC CATH AND TO SEE IF STABLE TO SURGERY.    PER DR BERRY , MAY TAKE PLAVIX 75 MG  DAILY UP UNTIL WEEK  PRIOR TO  SURGERY WITH DR Donzetta Matters.   RN  SPOKE TO DAUGHTER, INFORMATION GIVEN AND AWARE WILL CONTACT TOMORROW WITH APPOINTMENT  DAUGHTER VERBALIZED UNDERSTANDING .

## 2018-09-21 NOTE — Telephone Encounter (Signed)
Spoke with daughter , she states that she and the patient  are concerned that  The plavix is on hold until  Fem- pop Surgery   With Dr Donzetta Matters.  per daughter,  Dr Ellyn Hack   Stated patient would be seeing a cardiologist in one month . Dr Donzetta Matters stated that his office will contact patient concerning arrangement of surgery.  Per hospital notes, patient to stop plavix until surgery.Daughter wanted to know if  All doctors aware  That surgery will been done sometime in dec 2019 probably.  Daughter aware will defer to Dr Gwenlyn Found

## 2018-09-21 NOTE — Telephone Encounter (Signed)
See other telephone  Note by daughter.  daughter will speak to patient

## 2018-09-22 NOTE — Telephone Encounter (Signed)
Left message to inform daughter and patient - scheduler is working on getting  an appointment with Dr [name] prior to appointment with Dr Donzetta Matters 10/14/18 per Dr Gwenlyn Found recommendation. May call with any questions.

## 2018-09-23 ENCOUNTER — Other Ambulatory Visit: Payer: Self-pay

## 2018-09-23 NOTE — Telephone Encounter (Signed)
I reviewed his recent cath. He has moderate LAD stenosis with a negative FFR evaluation. The OM branch, previously stented, has complex stenosis - this is chronic and unchanged. The RCA has severe stenosis but it is small and nondominant. Reviewed plans for surgical revascularization of the SFA for severe lifestyle limiting claudication and I agree. He can proceed with surgery without further cardiac testing. He does not need a pre-op cardiac assessment as he was just recently seen. thx

## 2018-09-26 ENCOUNTER — Other Ambulatory Visit: Payer: Self-pay | Admitting: Cardiovascular Disease

## 2018-10-11 NOTE — Telephone Encounter (Signed)
New message   Per [name], Patient's daughter would like a call to explain the previous message that that no appt is needed for the cardiac clearance since the patient has seen the Dr. recently. Please call.

## 2018-10-11 NOTE — Telephone Encounter (Signed)
Spoke with the daughter, per dpr, advised that Dr. [name] said he did not need anymore cardiac workup before the procedure.

## 2018-10-14 ENCOUNTER — Other Ambulatory Visit: Payer: Self-pay

## 2018-10-14 ENCOUNTER — Encounter: Payer: Self-pay | Admitting: Vascular Surgery

## 2018-10-14 ENCOUNTER — Ambulatory Visit (HOSPITAL_COMMUNITY)
Admission: RE | Admit: 2018-10-14 | Discharge: 2018-10-14 | Disposition: A | Discharge status: Home / Self Care | Payer: PPO | Source: Ambulatory Visit | Attending: Vascular Surgery | Admitting: Vascular Surgery

## 2018-10-14 ENCOUNTER — Ambulatory Visit (INDEPENDENT_AMBULATORY_CARE_PROVIDER_SITE_OTHER): Payer: PPO | Admitting: Vascular Surgery

## 2018-10-14 VITALS — BP 131/68 | HR 64 | Temp 97.0°F | Resp 16 | Ht 65.0 in | Wt 170.0 lb

## 2018-10-14 DIAGNOSIS — I70213 Atherosclerosis of native arteries of extremities with intermittent claudication, bilateral legs: Secondary | ICD-10-CM | POA: Insufficient documentation

## 2018-10-14 DIAGNOSIS — Z01812 Encounter for preprocedural laboratory examination: Secondary | ICD-10-CM | POA: Insufficient documentation

## 2018-10-14 NOTE — Progress Notes (Signed)
Patient ID: Harold Waters, male   DOB: 1946-08-07, 72 y.o.   MRN: [id number]  Reason for Consult: New Patient (Initial Visit)   Referred by Plotnikov, [name], MD  Subjective:     HPI:  Harold Waters is a 72 y.o. male follows up with vein mapping for left lower extremity pain.  He has claudication very short distances but continues to work.  He also has swelling of his left lower extremity.  He does not have any tissue loss or ulceration.  He has previous stenting in his left SFA that are known to be occluded.  I had evaluated him in the hospital and he is here today for further discussion of surgery.  Past Medical History:  Diagnosis Date  . Angina   . Chronic stomach ulcer    "get them off and on" (09/19/2018)  . Coronary atherosclerosis of unspecified type of vessel, native or graft   . Diverticulosis   . DVT of lower extremity (deep venous thrombosis) ([place]) ~ 2010   LLE  . Erosive gastritis   . Essential hypertension, benign   . GERD (gastroesophageal reflux disease)   . Headache(784.0) 02/18/12   "lately"  . History of kidney stones   . Internal hemorrhoids   . Lower back pain   . Myocardial infarction ([place]) 1997  . Osteoarthritis   . Other and unspecified hyperlipidemia   . PAD (peripheral artery disease) ([facility])    with ABI's 0.8 on the right and 0.86 on the left  . Pneumonia 1957  . PSVT (paroxysmal supraventricular tachycardia) ([place]) 02/18/12  . Shortness of breath    "lying down"  . Sleep apnea    does not wear c-pap; pt does not recall this hx on 09/19/2018   Family History  Problem Relation Age of Onset  . Ulcers Father        had stomach issues, not sure what happened  . Coronary artery disease Brother        male 1st degree relative <50  . Colon cancer Neg Hx   . Esophageal cancer Neg Hx   . Rectal cancer Neg Hx   . Stomach cancer Neg Hx    Past Surgical History:  Procedure Laterality Date  . ARTHROSCOPY KNEE W/ DRILLING Right ~ 2001  . CARDIAC  CATHETERIZATION  09/19/2018  . COLONOSCOPY    . CORONARY ANGIOPLASTY WITH STENT PLACEMENT  1997   "2"  . ILIAC ARTERY STENT  04/22/2017   . Placement of a 6 mm x 100 mm Viabahn covered stent left SFA  . INTRAVASCULAR PRESSURE WIRE/FFR STUDY  09/19/2018  . INTRAVASCULAR PRESSURE WIRE/FFR STUDY N/A 09/19/2018   Procedure: INTRAVASCULAR PRESSURE WIRE/FFR STUDY;  Surgeon: [name], MD;  Location: [facility];  Service: Cardiovascular;  Laterality: N/A;  . LEFT HEART CATH AND CORONARY ANGIOGRAPHY N/A 09/19/2018   Procedure: LEFT HEART CATH AND CORONARY ANGIOGRAPHY;  Surgeon: [name], MD;  Location: [facility];  Service: Cardiovascular;  Laterality: N/A;  . LEFT HEART CATHETERIZATION WITH CORONARY ANGIOGRAM N/A 02/19/2012   Procedure: LEFT HEART CATHETERIZATION WITH CORONARY ANGIOGRAM;  Surgeon: Larey Dresser, MD;  Location: [facility];  Service: Cardiovascular;  Laterality: N/A;  . LOWER EXTREMITY ANGIOGRAPHY Bilateral 04/22/2017   Procedure: Lower Extremity Angiography;  Surgeon: [name], MD;  Location: [place] [facility];  Service: Cardiovascular;  Laterality: Bilateral;  . LOWER EXTREMITY ANGIOGRAPHY Bilateral 09/19/2018  . LOWER EXTREMITY ANGIOGRAPHY Bilateral 09/19/2018  Procedure: LOWER EXTREMITY ANGIOGRAPHY;  Surgeon: [name], MD;  Location: [place] [facility];  Service: Cardiovascular;  Laterality: Bilateral;  . PERIPHERAL ARTERIAL STENT GRAFT  09/2010   LLE  . PERIPHERAL VASCULAR ATHERECTOMY Left 04/22/2017   Procedure: Peripheral Vascular Atherectomy;  Surgeon: [name], MD;  Location: [place] [facility] [facility];  Service: Cardiovascular;  Laterality: Left;  SFA  . PERIPHERAL VASCULAR INTERVENTION Left 04/22/2017   Procedure: Peripheral Vascular Intervention;  Surgeon: [name], MD;  Location: [facility];  Service: Cardiovascular;  Laterality: Left;  SFA  . UPPER GASTROINTESTINAL ENDOSCOPY      Short Social  History:  Social History   Tobacco Use  . Smoking status: Former Smoker    Packs/day: 1.00    Years: 40.00    Pack years: 40.00    Types: Cigarettes    Last attempt to quit: 04/03/2010    Years since quitting: 8.5  . Smokeless tobacco: Never Used  Substance Use Topics  . Alcohol use: Not Currently    Comment: "used to drink when I was young; quit ~ 1980's"    Allergies  Allergen Reactions  . Eggs Or Egg-Derived Products Itching    Current Outpatient Medications  Medication Sig Dispense Refill  . aspirin EC 81 MG tablet Take 1 tablet (81 mg total) by mouth daily. (Patient taking differently: Take 81 mg by mouth at bedtime. )    . carvedilol (COREG) 3.125 MG tablet Take 1 tablet (3.125 mg total) by mouth 2 (two) times daily with a meal. 120 tablet 4  . isosorbide mononitrate (IMDUR) 30 MG 24 hr tablet Take 1 tablet (30 mg total) by mouth daily. (Patient taking differently: Take 30 mg by mouth daily at 3 pm. ) 90 tablet 3  . Multiple Vitamins-Minerals (MENS MULTIVITAMIN PLUS) TABS Take 1 tablet by mouth at bedtime.     . rosuvastatin (CRESTOR) 40 MG tablet Take 1 tablet (40 mg total) by mouth daily. (Patient taking differently: Take 40 mg by mouth at bedtime. ) 30 tablet 0  . rosuvastatin (CRESTOR) 40 MG tablet TAKE 1 TABLET BY MOUTH DAILY. PLEASE MAKE OVERDUE YEARLY APPT WITH DR. [name] BEFORE ANYMORE REFILLS 90 tablet 3   No current facility-administered medications for this visit.     Review of Systems  Eyes: Eyes negative.  Respiratory: Positive for shortness of breath and wheezing.  Cardiovascular: Positive for claudication, dyspnea with exertion and leg swelling.  Musculoskeletal: Positive for gait problem, leg pain and joint pain.  Hematologic: Hematologic/lymphatic negative.  Psychiatric: Psychiatric negative.        Objective:  Objective   Vitals:   10/14/18 1120  BP: 131/68  Pulse: 64  Resp: 16  Temp: (!) 97 F (36.1 C)  TempSrc: Oral  SpO2: 94%  Weight:  170 lb (77.1 kg)  Height: 5\' 5"  (1.651 m)   Body mass index is 28.29 kg/m.  Physical Exam Constitutional:      Appearance: Normal appearance.  Neck:     Musculoskeletal: Neck supple.  Cardiovascular:     Rate and Rhythm: Normal rate.     Pulses:          Femoral pulses are 2+ on the right side and 2+ on the left side.      Popliteal pulses are 2+ on the right side and 0 on the left side.       Dorsalis pedis pulses are 2+ on the right side and 0 on the left side.  Abdominal:     Palpations: Abdomen is soft.  Musculoskeletal: Normal range of motion.  Skin:    General: Skin is warm.     Capillary Refill: Capillary refill takes less than 2 seconds.  Neurological:     General: No focal deficit present.     Mental Status: He is alert.  Psychiatric:        Mood and Affect: Mood normal.        Behavior: Behavior normal.        Thought Content: Thought content normal.        Judgment: Judgment normal.     Data: I have independently interpreted his vein mapping which demonstrates suitable vein in the left lower extremity for use although there is area of varicosities distally.  The vein does measure up to 0.77 cm in the distal thigh and there are fiber strands there.     Assessment/Plan:     72 year old male with history of left SFA stenting for claudication that have occluded and claudication has recurred.  I discussed with him that this is not a life-threatening situation given that he has coronary disease he would be at some risk for surgery.  Vein mapping demonstrated suitable vein at least the level of the knee although there are fibrin strands.  It is possible he could get a femoral to above-knee popliteal bypass with vein.  This may also help with his leg swelling and his varicose veins as well.  I discussed with him the need for a 3 to 5-day hospitalization should his heart do fine from surgery.  Of also discussed he would likely need rehab given his home situation and not  return to work for period of at least 6 weeks.  He demonstrates good understanding and wants to wait until next year to consider surgical intervention.     Harold Sandy MD [facility]

## 2018-10-18 ENCOUNTER — Ambulatory Visit: Payer: PPO | Admitting: Physician Assistant

## 2018-10-18 ENCOUNTER — Encounter: Payer: Self-pay | Admitting: Physician Assistant

## 2018-10-18 VITALS — BP 118/68 | HR 68 | Ht 65.0 in | Wt 170.2 lb

## 2018-10-18 DIAGNOSIS — I739 Peripheral vascular disease, unspecified: Secondary | ICD-10-CM

## 2018-10-18 DIAGNOSIS — E785 Hyperlipidemia, unspecified: Secondary | ICD-10-CM

## 2018-10-18 DIAGNOSIS — I25119 Atherosclerotic heart disease of native coronary artery with unspecified angina pectoris: Secondary | ICD-10-CM

## 2018-10-18 DIAGNOSIS — I10 Essential (primary) hypertension: Secondary | ICD-10-CM

## 2018-10-18 DIAGNOSIS — R0683 Snoring: Secondary | ICD-10-CM

## 2018-10-18 HISTORY — DX: Peripheral vascular disease, unspecified: I73.9

## 2018-10-18 NOTE — Patient Instructions (Signed)
Medication Instructions:  You should be on Coreg (Carvedilol) 3.125 mg Twice daily.  Please start taking it Twice daily   If you need a refill on your cardiac medications before your next appointment, please call your pharmacy.   Lab work: None   If you have labs (blood work) drawn today and your tests are completely normal, you will receive your results only by: [name] MyChart Message (if you have MyChart) OR . A paper copy in the mail If you have any lab test that is abnormal or we need to change your treatment, we will call you to review the results.  Testing/Procedures: None   Follow-Up: At [facility], you and your health needs are our priority.  As part of our continuing mission to provide you with exceptional heart care, we have created designated Provider Care Teams.  These Care Teams include your primary Cardiologist (physician) and Advanced Practice Providers (APPs -  Physician Assistants and Nurse Practitioners) who all work together to provide you with the care you need, when you need it. You will need a follow up appointment in:  8 weeks.  Please call our office 2 months in advance to schedule this appointment.  You may see [name], MD or one of the following Advanced Practice Providers on your designated Care Team: Richardson Dopp, PA-C  Any Other Special Instructions Will Be Listed Below (If Applicable). If your chest symptoms, shortness of breath or fatigue get worse, call us.

## 2018-10-18 NOTE — Progress Notes (Signed)
Cardiology Office Note:    Date:  10/18/2018   ID:  METRO EDENFIELD, DOB 08-08-1946, MRN [id number]  PCP:  Cassandria Anger, MD  Cardiologist:  [name], MD  Electrophysiologist:  None  PV:  [name], MD  Referring MD: Cassandria Anger, MD   Chief Complaint  Patient presents with  . Hospitalization Follow-up    Status post cardiac catheterization    History of Present Illness:    Harold Waters is a 72 y.o. male with coronary artery disease, prior stroke, and peripheral arterial disease.  He is status post left SFA stenting in 2011 and in June 2018.  He was recently seen by Dr. [name] 09/02/2018 with complaints of significant left lower extremity claudication as well as chest discomfort.  Vascular studies demonstrated a drop in his ABI down to 0.7 and evidence of total occlusion of the left SFA stent.  His case was discussed with Dr. Gwenlyn Found.  The decision was made to proceed with cardiac catheterization as well as peripheral arteriogram.  Cardiac catheterization was performed by Dr. Gwenlyn Found and demonstrated three-vessel CAD with high-grade nondominant RCA stent restenosis, high-grade second marginal branch in-stent restenosis and moderate LAD diagonal branch bifurcational disease with an FFR of 0.82 suggesting borderline hemodynamic significance.  His peripheral arteriogram demonstrated mid left SFA chronic occlusion at the site of the prior stent and there was a small infrarenal abdominal aortic aneurysm above the iliac bifurcation.  It was felt that his coronary artery disease could be treated medically and he was referred to Dr. Donzetta Matters of vascular surgery for left femoral-popliteal bypass grafting.   Harold Waters returns for follow-up.  He is here with his daughter.  He notes less chest discomfort since his medications were adjusted for angina.  He is only taking Coreg once a day.  He was told by vascular surgery that he can continue on clopidogrel for now.  He notes shortness of  breath with exertion.  This is improved since adjusting his medications.  He has some mild left lower extremity swelling.  He continues to have left lower extremity intermittent claudication that improves with rest.  He does not have any sores on his feet.  His wife notes that he snores.  He sometimes awakens with what sounds like apnea.  He has also had what sounds like hypotension after prolonged standing in the past.  Prior CV studies:   The following studies were reviewed today:  Cardiac catheterization 09/19/2018 LAD proximal 60 (FFR 0.82); D1 ostial 50 OM2 stent 99 ISR RCA proximal stent 90 ISR   Nuclear stress test 09/01/18 Low risk stress nuclear study with a mild fixed inferolateral perfusion defect and mildly depressed left ventricular systolic function. No reversible ischemia is seen.  EF 46  Echo 08/19/2018 Mild concentric LVH, EF 60-65, normal wall motion, grade 1 diastolic dysfunction, mild AI, mild MR, mild TR, mild PI  Carotid US 07/10/2013 Bilateral ICA 1-39>> follow-up 2 years  Past Medical History:  Diagnosis Date  . Angina   . Chronic stomach ulcer    "get them off and on" (09/19/2018)  . Coronary atherosclerosis of unspecified type of vessel, native or graft   . Diverticulosis   . DVT of lower extremity (deep venous thrombosis) ([place]) ~ 2010   LLE  . Erosive gastritis   . Essential hypertension, benign   . GERD (gastroesophageal reflux disease)   . Headache(784.0) 02/18/12   "lately"  . History of kidney stones   . Internal hemorrhoids   .  Lower back pain   . Myocardial infarction ([place]) 1997  . Osteoarthritis   . Other and unspecified hyperlipidemia   . PAD (peripheral artery disease) ([facility])    with ABI's 0.8 on the right and 0.86 on the left  . Pneumonia 1957  . PSVT (paroxysmal supraventricular tachycardia) ([place]) 02/18/12  . Shortness of breath    "lying down"  . Sleep apnea    does not wear c-pap; pt does not recall this hx on 09/19/2018   Surgical  Hx: The patient  has a past surgical history that includes Arthroscopy knee w/ drilling (Right, ~ 2001); Peripheral arterial stent graft (09/2010); left heart catheterization with coronary angiogram (N/A, 02/19/2012); Colonoscopy; Upper gastrointestinal endoscopy; Iliac artery stent (04/22/2017); Lower Extremity Angiography (Bilateral, 04/22/2017); PERIPHERAL VASCULAR ATHERECTOMY (Left, 04/22/2017); PERIPHERAL VASCULAR INTERVENTION (Left, 04/22/2017); Lower Extremity Angiography (Bilateral, 09/19/2018); INTRAVASCULAR PRESSURE WIRE/FFR STUDY (09/19/2018); Coronary angioplasty with stent (1997); Cardiac catheterization (09/19/2018); Lower Extremity Angiography (Bilateral, 09/19/2018); LEFT HEART CATH AND CORONARY ANGIOGRAPHY (N/A, 09/19/2018); and INTRAVASCULAR PRESSURE WIRE/FFR STUDY (N/A, 09/19/2018).   Current Medications: Current Meds  Medication Sig  . aspirin EC 81 MG tablet Take 81 mg by mouth daily.  . carvedilol (COREG) 3.125 MG tablet Take 3.125 mg by mouth 2 (two) times daily with a meal.   . clopidogrel (PLAVIX) 75 MG tablet Take 75 mg by mouth daily.  . isosorbide mononitrate (IMDUR) 30 MG 24 hr tablet Take 30 mg by mouth daily.  . Multiple Vitamins-Minerals (MENS MULTIVITAMIN PLUS) TABS Take 1 tablet by mouth at bedtime.   . rosuvastatin (CRESTOR) 40 MG tablet TAKE 1 TABLET BY MOUTH DAILY. PLEASE MAKE OVERDUE YEARLY APPT WITH DR. [name] BEFORE ANYMORE REFILLS     Allergies:   Eggs or egg-derived products   Social History   Tobacco Use  . Smoking status: Former Smoker    Packs/day: 1.00    Years: 40.00    Pack years: 40.00    Types: Cigarettes    Last attempt to quit: 04/03/2010    Years since quitting: 8.5  . Smokeless tobacco: Never Used  Substance Use Topics  . Alcohol use: Not Currently    Comment: "used to drink when I was young; quit ~ 1980's"  . Drug use: Never     Family Hx: The patient's family history includes Coronary artery disease in his brother; Ulcers in his  father. There is no history of Colon cancer, Esophageal cancer, Rectal cancer, or Stomach cancer.  ROS:   Please see the history of present illness.    Review of Systems  Constitution: Positive for malaise/fatigue and weight gain.  HENT: Positive for hearing loss.   Eyes: Positive for visual disturbance.  Cardiovascular: Positive for chest pain, dyspnea on exertion and leg swelling.  Respiratory: Positive for cough, shortness of breath, snoring and wheezing.   Musculoskeletal: Positive for joint pain.  Neurological: Positive for dizziness, headaches and loss of balance.  Psychiatric/Behavioral: Positive for depression.   All other systems reviewed and are negative.   EKGs/Labs/Other Test Reviewed:    EKG:  EKG is  ordered today.  The ekg ordered today demonstrates normal sinus rhythm, heart rate 68, normal axis, QTC 440, inferior Q waves, no change from prior tracing  Recent Labs: 09/02/2018: BUN 20; Creatinine, Ser 1.22; Hemoglobin 14.1; Platelets 258; Potassium 4.9; Sodium 142   Recent Lipid Panel Lab Results  Component Value Date/Time   CHOL 156 05/03/2017 07:32 AM   TRIG 150 (H) 05/03/2017 07:32 AM   HDL 46  05/03/2017 07:32 AM   CHOLHDL 3.4 05/03/2017 07:32 AM   CHOLHDL 4 12/24/2014 08:02 AM   LDLCALC 80 05/03/2017 07:32 AM   LDLDIRECT 113.2 08/19/2011 08:55 AM    Physical Exam:    VS:  BP 118/68   Pulse 68   Ht 5\' 5"  (1.651 m)   Wt 170 lb 3.2 oz (77.2 kg)   SpO2 92%   BMI 28.32 kg/m     Wt Readings from Last 3 Encounters:  10/18/18 170 lb 3.2 oz (77.2 kg)  10/14/18 170 lb (77.1 kg)  09/20/18 168 lb 3.2 oz (76.3 kg)     Physical Exam  Constitutional: He is oriented to person, place, and time. He appears well-developed and well-nourished. No distress.  HENT:  Head: Normocephalic and atraumatic.  Eyes: No scleral icterus.  Neck: No JVD present. No thyromegaly present.  Cardiovascular: Normal rate and regular rhythm.  No murmur heard. Pulmonary/Chest: Effort  normal. He has no rales.  Abdominal: Soft.  Musculoskeletal:        General: No edema.     Comments: R groin without hematoma or bruit  Lymphadenopathy:    He has no cervical adenopathy.  Neurological: He is alert and oriented to person, place, and time.  Skin: Skin is warm and dry.  Psychiatric: He has a normal mood and affect.    ASSESSMENT & PLAN:    Coronary artery disease involving native coronary artery of native heart with angina pectoris ([place]) History of prior stenting to the RCA and second marginal branch.  Recent cardiac catheterization demonstrated high-grade in-stent restenosis of the RCA stent and a second obtuse marginal stent.  He also has moderate nonobstructive disease in the LAD of borderline hemodynamic significance.  Dr. Gwenlyn Found recommended medical therapy.  His symptoms have improved on his current medical regimen.  Continue aspirin, clopidogrel, isosorbide.  I have asked him to go ahead and increase his carvedilol to twice daily dosing.  If he continues to have symptoms on this regimen, we could consider increasing his isosorbide further.   PAD (peripheral artery disease) ([place])  He has been evaluated by Dr. Donzetta Matters with vascular surgery.  Left lower extremity revascularization surgery is planned in the near future.  I have asked the patient to follow-up with vascular surgery sooner if his claudication symptoms should worsen.  Essential hypertension  The patient's blood pressure is controlled on his current regimen.  Continue current therapy.   Hyperlipidemia, unspecified hyperlipidemia type Continue high-dose statin therapy.  Snoring He describes what sounds like apneic episodes.  I have recommended that we consider sleep testing once he has recovered from his vascular surgery.   Dispo:  Return in about 8 weeks (around 12/13/2018) for Routine Follow Up, w/ Dr. [name], or Richardson Dopp, PA-C.   Medication Adjustments/Labs and Tests Ordered: Current medicines are  reviewed at length with the patient today.  Concerns regarding medicines are outlined above.  Tests Ordered: Orders Placed This Encounter  Procedures  . EKG 12-Lead   Medication Changes: No orders of the defined types were placed in this encounter.   Signed, Richardson Dopp, PA-C  10/18/2018 3:38 PM    [facility] [address], [place], [place]  [address] Phone: [phone]; Fax: [phone]

## 2018-11-04 ENCOUNTER — Encounter: Payer: Self-pay | Admitting: Vascular Surgery

## 2018-11-04 ENCOUNTER — Other Ambulatory Visit: Payer: Self-pay | Admitting: *Deleted

## 2018-11-04 ENCOUNTER — Other Ambulatory Visit: Payer: Self-pay | Admitting: Vascular Surgery

## 2018-11-14 ENCOUNTER — Encounter: Payer: Self-pay | Admitting: Cardiology

## 2018-11-15 ENCOUNTER — Ambulatory Visit (INDEPENDENT_AMBULATORY_CARE_PROVIDER_SITE_OTHER): Payer: PPO | Admitting: Cardiology

## 2018-11-15 ENCOUNTER — Encounter: Payer: Self-pay | Admitting: Cardiology

## 2018-11-15 VITALS — BP 138/73 | HR 66 | Ht 65.0 in | Wt 172.0 lb

## 2018-11-15 DIAGNOSIS — I200 Unstable angina: Secondary | ICD-10-CM | POA: Diagnosis not present

## 2018-11-15 MED ORDER — NITROGLYCERIN 0.4 MG SL SUBL: SUBLINGUAL_TABLET | SUBLINGUAL | 1 refills | Status: DC

## 2018-11-15 MED ORDER — ISOSORBIDE MONONITRATE ER 60 MG PO TB24: 60.0000 mg | ORAL_TABLET | Freq: Every day | ORAL | 3 refills | Status: DC

## 2018-11-15 NOTE — Progress Notes (Signed)
11/15/2018 [place]   [birth year]  [id number]  Primary Physician Plotnikov, [name], MD Primary Cardiologist: Dr [name]- CV                                    Dr Gwenlyn Found- PV                                    Dr Donzetta Matters- Vascular  HPI: The patient is a pleasant 73 year old male with known coronary disease and vascular disease.  He has had prior RCA and circumflex intervention in the 90s.  In 2011 he had an unsuccessful attempt at an OM intervention.  Catheterization in April 2013 resulted in medical therapy.  He also has vascular disease with prior left SFA PTA in 2011 with redo in 2018.  Recently he has had claudication.  This led to evaluation of his left SFA stent.  He has also had some chest pain.  Catheterization was done in November 2019.  He has three-vessel coronary disease with plans for medical therapy.  His peripheral angiogram shows he has an occluded left SFA stent which will require surgical treatment.  The patient is in the office today with complaints of chest pain.  He describes burning and tightness.  It is not always exertional.  He does not have nitroglycerin at home.  He says his symptoms are usually relieved relieved with taking a few deep breaths.  He denies any associated shortness of breath or diaphoresis or nausea.  His daughter accompanied him today.  She was concerned because he is scheduled for vascular surgery on February 4 and was afraid that he would not be able to have that since he was having chest pain.   Current Outpatient Medications  Medication Sig Dispense Refill  . aspirin EC 81 MG tablet Take 81 mg by mouth daily.    . carvedilol (COREG) 3.125 MG tablet Take 3.125 mg by mouth 2 (two) times daily with a meal.     . clopidogrel (PLAVIX) 75 MG tablet Take 75 mg by mouth daily.    . isosorbide mononitrate (IMDUR) 30 MG 24 hr tablet Take 30 mg by mouth daily.    . Multiple Vitamins-Minerals (MENS MULTIVITAMIN PLUS) TABS Take 1 tablet by mouth at bedtime.      . rosuvastatin (CRESTOR) 40 MG tablet TAKE 1 TABLET BY MOUTH DAILY. PLEASE MAKE OVERDUE YEARLY APPT WITH DR. [name] BEFORE ANYMORE REFILLS 90 tablet 3   No current facility-administered medications for this visit.     Allergies  Allergen Reactions  . Eggs Or Egg-Derived Products Itching    Past Medical History:  Diagnosis Date  . Angina   . Chronic stomach ulcer    "get them off and on" (09/19/2018)  . Coronary atherosclerosis of unspecified type of vessel, native or graft   . Diverticulosis   . DVT of lower extremity (deep venous thrombosis) ([place]) ~ 2010   LLE  . Erosive gastritis   . Essential hypertension, benign   . GERD (gastroesophageal reflux disease)   . Headache(784.0) 02/18/12   "lately"  . History of kidney stones   . Internal hemorrhoids   . Lower back pain   . Myocardial infarction ([place]) 1997  . Osteoarthritis   . Other and unspecified hyperlipidemia   . PAD (peripheral artery disease) ([place])  with ABI's 0.8 on the right and 0.86 on the left  . Pneumonia 1957  . PSVT (paroxysmal supraventricular tachycardia) ([place]) 02/18/12  . Shortness of breath    "lying down"  . Sleep apnea    does not wear c-pap; pt does not recall this hx on 09/19/2018    Social History   Socioeconomic History  . Marital status: Married    Spouse name: Not on file  . Number of children: 2  . Years of education: Not on file  . Highest education level: Not on file  Occupational History  . Occupation: retired  Scientific laboratory technician  . Financial resource strain: Not on file  . Food insecurity:    Worry: Not on file    Inability: Not on file  . Transportation needs:    Medical: Not on file    Non-medical: Not on file  Tobacco Use  . Smoking status: Former Smoker    Packs/day: 1.00    Years: 40.00    Pack years: 40.00    Types: Cigarettes    Last attempt to quit: 04/03/2010    Years since quitting: 8.6  . Smokeless tobacco: Never Used  Substance and Sexual Activity  . Alcohol use:  Not Currently    Comment: "used to drink when I was young; quit ~ 1980's"  . Drug use: Never  . Sexual activity: Yes  Lifestyle  . Physical activity:    Days per week: Not on file    Minutes per session: Not on file  . Stress: Not on file  Relationships  . Social connections:    Talks on phone: Not on file    Gets together: Not on file    Attends religious service: Not on file    Active member of club or organization: Not on file    Attends meetings of clubs or organizations: Not on file    Relationship status: Not on file  . Intimate partner violence:    Fear of current or ex partner: Not on file    Emotionally abused: Not on file    Physically abused: Not on file    Forced sexual activity: Not on file  Other Topics Concern  . Not on file  Social History Narrative  . Not on file     Family History  Problem Relation Age of Onset  . Ulcers Father        had stomach issues, not sure what happened  . Coronary artery disease Brother        male 1st degree relative <50  . Colon cancer Neg Hx   . Esophageal cancer Neg Hx   . Rectal cancer Neg Hx   . Stomach cancer Neg Hx      Review of Systems: General: negative for chills, fever, night sweats or weight changes.  Cardiovascular: negative for chest pain, dyspnea on exertion, edema, orthopnea, palpitations, paroxysmal nocturnal dyspnea or shortness of breath Dermatological: negative for rash Respiratory: negative for cough or wheezing Urologic: negative for hematuria Abdominal: negative for nausea, vomiting, diarrhea, bright red blood per rectum, melena, or hematemesis Neurologic: negative for visual changes, syncope, or dizziness All other systems reviewed and are otherwise negative except as noted above.    Blood pressure 138/73, pulse 66, height 5\' 5"  (1.651 m), weight 172 lb (78 kg).  General appearance: alert, cooperative and no distress Lungs: clear to auscultation bilaterally Heart: regular rate and rhythm and  2/6 systolic murmur AOV Skin: Skin color, texture, turgor normal. No  rashes or lesions Neurologic: Grossly normal  EKG NSR-no acute changes  ASSESSMENT AND PLAN:   Coronary artery disease involving native coronary artery of native heart with angina pectoris (HCC) Increase Imdur to 60 mg-f/u in 7-14 days  PAD (peripheral artery disease) ([place])  He has been evaluated by Dr. Donzetta Matters with vascular surgery.  Left lower extremity revascularization surgery is planned in the near future.    Essential hypertension  The patient's blood pressure is controlled on his current regimen.  Continue current therapy.   Hyperlipidemia, unspecified hyperlipidemia type Continue high-dose statin therapy.   PLAN  Increase Imdur to 60 mg- f/u before vascular surgery Feb 4th.   Kerin Ransom PA-C 11/15/2018 12:26 PM

## 2018-11-15 NOTE — Patient Instructions (Signed)
Medication Instructions:   INCREASE IMDUR TO 60 MG DAILY   If you need a refill on your cardiac medications before your next appointment, please call your pharmacy.   Lab work:  NONE   If you have labs (blood work) drawn today and your tests are completely normal, you will receive your results only by: [name] MyChart Message (if you have MyChart) OR . A paper copy in the mail If you have any lab test that is abnormal or we need to change your treatment, we will call you to review the results.  Testing/Procedures:  NONE  Follow-Up:  . Your physician recommends that  follow-up with Kerin Ransom, PA-C on November 25, 2018 at 11:30AM

## 2018-11-25 ENCOUNTER — Encounter: Payer: Self-pay | Admitting: Cardiology

## 2018-11-25 ENCOUNTER — Ambulatory Visit: Payer: PPO | Admitting: Cardiology

## 2018-11-25 VITALS — BP 126/64 | HR 74 | Ht 65.0 in | Wt 175.0 lb

## 2018-11-25 DIAGNOSIS — E785 Hyperlipidemia, unspecified: Secondary | ICD-10-CM | POA: Diagnosis not present

## 2018-11-25 DIAGNOSIS — I10 Essential (primary) hypertension: Secondary | ICD-10-CM | POA: Diagnosis not present

## 2018-11-25 DIAGNOSIS — I25119 Atherosclerotic heart disease of native coronary artery with unspecified angina pectoris: Secondary | ICD-10-CM | POA: Diagnosis not present

## 2018-11-25 DIAGNOSIS — I208 Other forms of angina pectoris: Secondary | ICD-10-CM | POA: Diagnosis not present

## 2018-11-25 DIAGNOSIS — Z01818 Encounter for other preprocedural examination: Secondary | ICD-10-CM | POA: Diagnosis not present

## 2018-11-25 DIAGNOSIS — I739 Peripheral vascular disease, unspecified: Secondary | ICD-10-CM

## 2018-11-25 NOTE — Pre-Procedure Instructions (Signed)
Harold Waters  11/25/2018      [id number] - [place], [place] [address] [id number] Phone: [phone] Fax: [phone]    Your procedure is scheduled on Tues., Feb. 4, 2020 from 7:30AM-11:06AM  Report to [facility] Admitting Entrance "A" at 5:30AM  Call this number if you have problems the morning of surgery:  [phone]   Remember:  Do not eat or drink after midnight on Feb. 3rd    Take these medicines the morning of surgery with A SIP OF WATER: Carvedilol (COREG) and Isosorbide mononitrate (IMDUR)  If needed: NitroGLYCERIN (NITROSTAT)  Follow your surgeon's instructions on when to stop Aspirin and Plavix.  If no instructions were given by your surgeon then you will need to call the office to get those instructions.  7 days before surgery (11/29/18), stop taking all Other Aspirin Products, Vitamins, Fish oils, and Herbal medications. Also stop all NSAIDS i.e. Advil, Ibuprofen, Motrin, Aleve, Anaprox, Naproxen, BC, Goody Powders, and all Supplements.      Do not wear jewelry.  Do not wear lotions, powders, colognes, or deodorant.  Do not shave 48 hours prior to surgery.  Men may shave face.  Do not bring valuables to the hospital.  [facility] is not responsible for any belongings or valuables.  Contacts, dentures or bridgework may not be worn into surgery.  Leave your suitcase in the car.  After surgery it may be brought to your room.  For patients admitted to the hospital, discharge time will be determined by your treatment team.  Patients discharged the day of surgery will not be allowed to drive home.   Special instructions:   [place]- Preparing For Surgery  Before surgery, you can play an important role. Because skin is not sterile, your skin needs to be as free of germs as possible. You can reduce the number of germs on your skin by washing with CHG (chlorahexidine gluconate) Soap before surgery.   CHG is an antiseptic cleaner which kills germs and bonds with the skin to continue killing germs even after washing.    Oral Hygiene is also important to reduce your risk of infection.  Remember - BRUSH YOUR TEETH THE MORNING OF SURGERY WITH YOUR REGULAR TOOTHPASTE  Please do not use if you have an allergy to CHG or antibacterial soaps. If your skin becomes reddened/irritated stop using the CHG.  Do not shave (including legs and underarms) for at least 48 hours prior to first CHG shower. It is OK to shave your face.  Please follow these instructions carefully.   1. Shower the NIGHT BEFORE SURGERY and the MORNING OF SURGERY with CHG.   2. If you chose to wash your hair, wash your hair first as usual with your normal shampoo.  3. After you shampoo, rinse your hair and body thoroughly to remove the shampoo.  4. Use CHG as you would any other liquid soap. You can apply CHG directly to the skin and wash gently with a scrungie or a clean washcloth.   5. Apply the CHG Soap to your body ONLY FROM THE NECK DOWN.  Do not use on open wounds or open sores. Avoid contact with your eyes, ears, mouth and genitals (private parts). Wash Face and genitals (private parts)  with your normal soap.  6. Wash thoroughly, paying special attention to the area where your surgery will be performed.  7. Thoroughly rinse your body with warm  water from the neck down.  8. DO NOT shower/wash with your normal soap after using and rinsing off the CHG Soap.  9. Pat yourself dry with a CLEAN TOWEL.  10. Wear CLEAN PAJAMAS to bed the night before surgery, wear comfortable clothes the morning of surgery  11. Place CLEAN SHEETS on your bed the night of your first shower and DO NOT SLEEP WITH PETS.  Day of Surgery:  Do not apply any deodorants/lotions.  Please wear clean clothes to the hospital/surgery center.   Remember to brush your teeth WITH YOUR REGULAR TOOTHPASTE.  Please read over the following fact sheets that  you were given. Pain Booklet, Coughing and Deep Breathing, MRSA Information and Surgical Site Infection Prevention

## 2018-11-25 NOTE — Progress Notes (Signed)
11/25/2018 Harold Waters   Mar 13, 1946  [id number]  Primary Physician Plotnikov, [name], MD Primary Cardiologist: Dr [name]- CV   HPI:  The patient is a pleasant 73 year old male with known coronary disease and vascular disease.  He has had prior RCA and circumflex intervention in the 90s.  In 2011 he had an unsuccessful attempt at an OM intervention.  Catheterization in April 2013 resulted in medical therapy.  He also has vascular disease with prior left SFA PTA in 2011 with redo in 2018.  Recently he has had claudication.  This led to evaluation of his left SFA stent. His peripheral angiogram shows he has an occluded left SFA stent which will require surgical treatment.  He has also had some chest pain.  Catheterization was done in November 2019.  He has three-vessel coronary disease with a 90% pRCA, 99% OM3, 50-60% LAD ath the take off of the Dx1. The plan is for medical therapy.  He had an echo in Oct 2019 that showed normal LVF.  I saw him in the office 11/15/2018.  He was complaining of chest pain suspicious for angina. I increased his Imdur to 60 mg and he is back today for follow up.  Since his medications were adjusted he has done well, chest pain resolved.  He'll be cleared for surgery 12/06/2018 from a cardiac standpoint.    Current Outpatient Medications  Medication Sig Dispense Refill  . aspirin EC 81 MG tablet Take 81 mg by mouth daily.    . carvedilol (COREG) 3.125 MG tablet Take 3.125 mg by mouth 2 (two) times daily with a meal.     . clopidogrel (PLAVIX) 75 MG tablet Take 75 mg by mouth daily.    . isosorbide mononitrate (IMDUR) 30 MG 24 hr tablet Take 60 mg by mouth daily.    . isosorbide mononitrate (IMDUR) 60 MG 24 hr tablet Take 1 tablet (60 mg total) by mouth daily. 90 tablet 3  . Multiple Vitamins-Minerals (MENS MULTIVITAMIN PLUS) TABS Take 1 tablet by mouth at bedtime.     . rosuvastatin (CRESTOR) 40 MG tablet TAKE 1 TABLET BY MOUTH DAILY. PLEASE MAKE OVERDUE YEARLY  APPT WITH DR. [name] BEFORE ANYMORE REFILLS (Patient taking differently: Take 40 mg by mouth daily. ) 90 tablet 3  . nitroGLYCERIN (NITROSTAT) 0.4 MG SL tablet Place 1 tablet under tongue every 5 mins. DO NOT USE MORE THAN 3 TABS (Patient not taking: Reported on 11/25/2018) 25 tablet 1   No current facility-administered medications for this visit.     Allergies  Allergen Reactions  . Eggs Or Egg-Derived Products Itching    Past Medical History:  Diagnosis Date  . Angina   . Chronic stomach ulcer    "get them off and on" (09/19/2018)  . Coronary atherosclerosis of unspecified type of vessel, native or graft   . Diverticulosis   . DVT of lower extremity (deep venous thrombosis) ([place]) ~ 2010   LLE  . Erosive gastritis   . Essential hypertension, benign   . GERD (gastroesophageal reflux disease)   . Headache(784.0) 02/18/12   "lately"  . History of kidney stones   . Internal hemorrhoids   . Lower back pain   . Myocardial infarction ([place]) 1997  . Osteoarthritis   . Other and unspecified hyperlipidemia   . PAD (peripheral artery disease) (HCC)    with ABI's 0.8 on the right and 0.86 on the left  . Pneumonia 1957  . PSVT (paroxysmal supraventricular tachycardia) ([place])  02/18/12  . Shortness of breath    "lying down"  . Sleep apnea    does not wear c-pap; pt does not recall this hx on 09/19/2018    Social History   Socioeconomic History  . Marital status: Married    Spouse name: Not on file  . Number of children: 2  . Years of education: Not on file  . Highest education level: Not on file  Occupational History  . Occupation: retired  Scientific laboratory technician  . Financial resource strain: Not on file  . Food insecurity:    Worry: Not on file    Inability: Not on file  . Transportation needs:    Medical: Not on file    Non-medical: Not on file  Tobacco Use  . Smoking status: Former Smoker    Packs/day: 1.00    Years: 40.00    Pack years: 40.00    Types: Cigarettes    Last  attempt to quit: 04/03/2010    Years since quitting: 8.6  . Smokeless tobacco: Never Used  Substance and Sexual Activity  . Alcohol use: Not Currently    Comment: "used to drink when I was young; quit ~ 1980's"  . Drug use: Never  . Sexual activity: Yes  Lifestyle  . Physical activity:    Days per week: Not on file    Minutes per session: Not on file  . Stress: Not on file  Relationships  . Social connections:    Talks on phone: Not on file    Gets together: Not on file    Attends religious service: Not on file    Active member of club or organization: Not on file    Attends meetings of clubs or organizations: Not on file    Relationship status: Not on file  . Intimate partner violence:    Fear of current or ex partner: Not on file    Emotionally abused: Not on file    Physically abused: Not on file    Forced sexual activity: Not on file  Other Topics Concern  . Not on file  Social History Narrative  . Not on file     Family History  Problem Relation Age of Onset  . Ulcers Father        had stomach issues, not sure what happened  . Coronary artery disease Brother        male 1st degree relative <50  . Colon cancer Neg Hx   . Esophageal cancer Neg Hx   . Rectal cancer Neg Hx   . Stomach cancer Neg Hx      Review of Systems: General: negative for chills, fever, night sweats or weight changes.  Cardiovascular: negative for chest pain, dyspnea on exertion, edema, orthopnea, palpitations, paroxysmal nocturnal dyspnea or shortness of breath Dermatological: negative for rash Respiratory: negative for cough or wheezing Urologic: negative for hematuria Abdominal: negative for nausea, vomiting, diarrhea, bright red blood per rectum, melena, or hematemesis Neurologic: negative for visual changes, syncope, or dizziness All other systems reviewed and are otherwise negative except as noted above.    Blood pressure 126/64, pulse 74, height 5\' 5"  (1.651 m), weight 175 lb (79.4  kg).  General appearance: alert, cooperative and no distress Lungs: clear to auscultation bilaterally Heart: regular rate and rhythm Extremities: no edema Skin: Warm and dry Neurologic: Grossly normal   ASSESSMENT AND PLAN:   Coronary artery disease involving native coronary artery of native heart with angina pectoris (HCC) Symptoms resolved with  ncrease of his Imdur to 60 mg-  PAD (peripheral artery disease) ([place]) He has been evaluated by Dr.Cainwith vascular surgery. Left lower extremity revascularization surgery is planned for Feb 4th  Essential hypertension The patient's blood pressure is controlled onhiscurrent regimen. Continue current therapy.   Hyperlipidemia, unspecified hyperlipidemia type Continue high-dose statin therapy.  PLAN  OK for surgery. I will send formal clearance note via Epic.   Kerin Ransom PA-C 11/25/2018 10:43 AM

## 2018-11-25 NOTE — Patient Instructions (Signed)
Medication Instructions:  Your physician recommends that you continue on your current medications as directed. Please refer to the Current Medication list given to you today. If you need a refill on your cardiac medications before your next appointment, please call your pharmacy.   Lab work: None  If you have labs (blood work) drawn today and your tests are completely normal, you will receive your results only by: [name] MyChart Message (if you have MyChart) OR . A paper copy in the mail If you have any lab test that is abnormal or we need to change your treatment, we will call you to review the results.  Testing/Procedures: None   Follow-Up: At [facility], you and your health needs are our priority.  As part of our continuing mission to provide you with exceptional heart care, we have created designated Provider Care Teams.  These Care Teams include your primary Cardiologist (physician) and Advanced Practice Providers (APPs -  Physician Assistants and Nurse Practitioners) who all work together to provide you with the care you need, when you need it. You will need a follow up appointment in:  6 months.  Please call our office 2 months (MAY 2020) in advance to schedule this appointment.  You may see [name], MD or one of the following Advanced Practice Providers on your designated Care Team: Richardson Dopp, PA-C [place], [place] . Daune Perch, NP  Any Other Special Instructions Will Be Listed Below (If Applicable).

## 2018-11-28 ENCOUNTER — Other Ambulatory Visit: Payer: Self-pay

## 2018-11-28 ENCOUNTER — Encounter (HOSPITAL_COMMUNITY): Payer: Self-pay

## 2018-11-28 ENCOUNTER — Encounter (HOSPITAL_COMMUNITY)
Admission: RE | Admit: 2018-11-28 | Discharge: 2018-11-28 | Disposition: A | Discharge status: Home / Self Care | Payer: PPO | Source: Ambulatory Visit | Attending: Vascular Surgery | Admitting: Vascular Surgery

## 2018-11-28 DIAGNOSIS — Z01812 Encounter for preprocedural laboratory examination: Secondary | ICD-10-CM | POA: Diagnosis not present

## 2018-11-28 LAB — COMPREHENSIVE METABOLIC PANEL
ALT: 33 U/L (ref 0–44)
AST: 20 U/L (ref 15–41)
Albumin: 3.9 g/dL (ref 3.5–5.0)
Alkaline Phosphatase: 66 U/L (ref 38–126)
Anion gap: 9 (ref 5–15)
BUN: 15 mg/dL (ref 8–23)
CO2: 24 mmol/L (ref 22–32)
Calcium: 9.4 mg/dL (ref 8.9–10.3)
Chloride: 105 mmol/L (ref 98–111)
Creatinine, Ser: 1.17 mg/dL (ref 0.61–1.24)
GFR calc Af Amer: >60 mL/min (ref >60)
GFR calc non Af Amer: >60 mL/min (ref >60)
Glucose, Bld: 230 mg/dL — ABNORMAL HIGH (ref 70–99)
Potassium: 4 mmol/L (ref 3.5–5.1)
Sodium: 138 mmol/L (ref 135–145)
Total Bilirubin: 1 mg/dL (ref 0.3–1.2)
Total Protein: 6.9 g/dL (ref 6.5–8.1)

## 2018-11-28 LAB — URINALYSIS, ROUTINE W REFLEX MICROSCOPIC
Bacteria, UA: NONE SEEN
Bilirubin Urine: NEGATIVE
Glucose, UA: >=500 mg/dL — AB
Hgb urine dipstick: NEGATIVE
Ketones, ur: NEGATIVE mg/dL
Leukocytes, UA: NEGATIVE
Nitrite: NEGATIVE
Protein, ur: NEGATIVE mg/dL
Specific Gravity, Urine: 1.015 (ref 1.005–1.030)
pH: 6 (ref 5.0–8.0)

## 2018-11-28 LAB — CBC
HCT: 44.5 % (ref 39.0–52.0)
Hemoglobin: 14.2 g/dL (ref 13.0–17.0)
MCH: 28.6 pg (ref 26.0–34.0)
MCHC: 31.9 g/dL (ref 30.0–36.0)
MCV: 89.5 fL (ref 80.0–100.0)
Platelets: 230 10*3/uL (ref 150–400)
RBC: 4.97 MIL/uL (ref 4.22–5.81)
RDW: 13.8 % (ref 11.5–15.5)
WBC: 5.7 10*3/uL (ref 4.0–10.5)
nRBC: 0 % (ref 0.0–0.2)

## 2018-11-28 LAB — SURGICAL PCR SCREEN
MRSA, PCR: NEGATIVE
Staphylococcus aureus: NEGATIVE

## 2018-11-28 LAB — TYPE AND SCREEN
ABO/RH(D): AB POS
Antibody Screen: NEGATIVE

## 2018-11-28 LAB — ABO/RH: ABO/RH(D): AB POS

## 2018-11-28 LAB — PROTIME-INR
INR: 0.99
Prothrombin Time: 13 seconds (ref 11.4–15.2)

## 2018-11-28 LAB — APTT: aPTT: 33 seconds (ref 24–36)

## 2018-11-28 NOTE — Progress Notes (Signed)
PCP - Dr. Alain Marion  Cardiologist - Dr. [name] PA Rosalyn Gess- C.C. 11/25/2018 (E)  Chest x-ray - Denies  EKG - 11/14/2018 (E)  Stress Test - 08/2018 (E)  ECHO - 08/2018 (E)  Cardiac Cath - 09/19/18 (E)  AICD- na PM- na LOOP- na  Sleep Study - Yes- Positive CPAP - None  LABS- 11/28/2018: CBC, CMP, PT, PTT, T/S, UA, PCR  ASA- LD- 2/3 Plavix-LD- 2/3   Anesthesia- Yes- cardiac history  Pt denies having chest pain, sob, or fever at this time. All instructions explained to the pt, with a verbal understanding of the material. Pt agrees to go over the instructions while at home for a better understanding. The opportunity to ask questions was provided.

## 2018-11-29 LAB — HEMOGLOBIN A1C
Hgb A1c MFr Bld: 8.8 % — ABNORMAL HIGH (ref 4.8–5.6)
Mean Plasma Glucose: 205.86 mg/dL

## 2018-11-29 NOTE — Progress Notes (Signed)
Anesthesia Chart Review:  Case:  [id number] Date/Time:  12/06/18 0715   Procedure:  BYPASS GRAFT FEMORAL-POPLITEAL ARTERY (Left )   Anesthesia type:  Choice   Pre-op diagnosis:  ATHEROSCLEROSIS OF NATIVE ARTERY OF BOTH LOWER EXTREMITIES WITH INTERMITTENT CLAUDICATION   Location:  [facility] OR ROOM 11 / [facility]   Surgeon:  Waynetta Sandy, MD      DISCUSSION: 73 yo male former smoker. Pertinent hx includes CAD (stenting to the RCA and second marginal branch in the '90s), CVA, PAD (s/p left SFA stenting in 2011 and in June 2018), GERD.  Pt seen by Kerin Ransom, PA-C 11/25/2018. CV summary copied from his note: "He has had prior RCA and circumflex intervention in the 90s. In 2011 he had an unsuccessful attempt at an OM intervention. Catheterization in April 2013 resulted in medical therapy. He also hasvascular disease with prior left SFA PTA in 2011 with redo in 2018. Recently hehas had claudication. This led to evaluation of his left SFA stent. His peripheral angiogram shows hehas an occluded left SFA stent which will require surgical treatment. He hasalsohad some chest pain. Catheterization was done in November 2019. He has three-vessel coronary disease with a 90% pRCA, 99% OM3, 50-60% LAD ath the take off of the Dx1. The plan is for medical therapy. He had an echo in Oct 2019 that showed normal LVF.  I saw him in the office 11/15/2018.  He was complaining of chest pain suspicious for angina. I increased his Imdur to 60 mg and he is back today for follow up.  Since his medications were adjusted he has done well, chest pain resolved.  He'll be cleared for surgery 12/06/2018 from a cardiac standpoint."  On PAT labs pt noted to have elevated BG 230 and >500 mg/dl glucose on UA. A1c added, 8.8. Pt not currently treated for DMII. Results called to Dr. Claretha Cooper office, per Zigmund Daniel the pt has severe limiting claudication and significant need for surgery. Consult placed to diabetes  coordinator.  Anticipate he can proceed as planned barring acute status change and BG acceptable on DOS.  VS: BP 129/69   Pulse 63   Temp 36.4 C   Resp 20   Ht 5\' 5"  (1.651 m)   Wt 78.4 kg   SpO2 95%   BMI 28.76 kg/m   PROVIDERS: Plotnikov, [name], MD is PCP  [name], MD is Cardiologist  LABS: Labs reviewed: Acceptable for surgery. (all labs ordered are listed, but only abnormal results are displayed)  Labs Reviewed  COMPREHENSIVE METABOLIC PANEL - Abnormal; Notable for the following components:      Result Value   Glucose, Bld 230 (*)    All other components within normal limits  URINALYSIS, ROUTINE W REFLEX MICROSCOPIC - Abnormal; Notable for the following components:   Glucose, UA >=500 (*)    All other components within normal limits  SURGICAL PCR SCREEN  APTT  CBC  PROTIME-INR  TYPE AND SCREEN  ABO/RH     IMAGES: CHEST  2 VIEW 11/29/2016  COMPARISON:  Chest radiograph 02/18/2012.  FINDINGS: The heart size and mediastinal contours are within normal limits. Both lungs are clear. The visualized skeletal structures are unremarkable.  IMPRESSION: No active cardiopulmonary disease.  EKG: 11/14/2018: NSR. Rate 66.  CV: Cath and PV angiogram 09/19/2018:  Prox RCA to Mid RCA lesion is 90% stenosed.  Ost 2nd Mrg to 2nd Mrg lesion is 99% stenosed.  Prox LAD lesion is 60% stenosed.  Ost 1st Diag  lesion is 50% stenosed.   Peripheral vascular angiogram-  1: Abdominal aorta- small infrarenal abdominal aortic aneurysm above the iliac bifurcation 2: Left lower extremity- the mid left SFA is chronically occluded at the site of prior stenting with reconstitution of the above-the-knee popliteal by profunda femoris collaterals in the adductor canal.  There was three-vessel runoff with segmental disease in the posterior tibial artery.  IMPRESSION: Mr. Reap has three-vessel coronary disease with a high-grade nondominant RCA stent restenosis,  high-grade second marginal branch in-stent restenosis, and moderate LAD diagonal branch bifurcation disease with an FFR of 0.82 suggesting borderline hemodynamic significance.  In addition, his left SFA stent is occluded above and below with three-vessel runoff and a highly diseased left posterior tibial artery.  I believe his coronary disease can be treated medically at this time and he will need left femoropopliteal bypass grafting.  I have discussed this with Drs. Cooper and Dr. Donzetta Matters.  TTE 08/19/2018: Study Conclusions  - Left ventricle: The cavity size was normal. There was mild   concentric hypertrophy. Systolic function was normal. The   estimated ejection fraction was in the range of 60% to 65%. Wall   motion was normal; there were no regional wall motion   abnormalities. Doppler parameters are consistent with abnormal   left ventricular relaxation (grade 1 diastolic dysfunction).   Doppler parameters are consistent with elevated ventricular   end-diastolic filling pressure. - Aortic valve: Trileaflet; mildly thickened, mildly calcified   leaflets. There was mild regurgitation. - Aortic root: The aortic root was normal in size. - Mitral valve: There was mild regurgitation. - Left atrium: The atrium was normal in size. - Right ventricle: Systolic function was normal. - Right atrium: The atrium was normal in size. - Tricuspid valve: There was mild regurgitation. - Pulmonic valve: There was mild regurgitation. - Pulmonary arteries: Systolic pressure was within the normal   range. - Inferior vena cava: The vessel was normal in size. - Pericardium, extracardiac: There was no pericardial effusion.  Past Medical History:  Diagnosis Date  . Angina   . Chronic stomach ulcer    "get them off and on" (09/19/2018)  . Coronary atherosclerosis of unspecified type of vessel, native or graft   . Diverticulosis   . DVT of lower extremity (deep venous thrombosis) ([place]) ~ 2010   LLE  .  Erosive gastritis   . Essential hypertension, benign   . GERD (gastroesophageal reflux disease)   . Headache(784.0) 02/18/12   "lately"  . History of kidney stones   . Internal hemorrhoids   . Lower back pain   . Myocardial infarction ([place]) 1997  . Osteoarthritis   . Other and unspecified hyperlipidemia   . PAD (peripheral artery disease) (HCC)    with ABI's 0.8 on the right and 0.86 on the left  . Pneumonia 1957  . PSVT (paroxysmal supraventricular tachycardia) ([place]) 02/18/12  . Shortness of breath    "lying down"  . Sleep apnea    does not wear c-pap; pt does not recall this hx on 09/19/2018    Past Surgical History:  Procedure Laterality Date  . ARTHROSCOPY KNEE W/ DRILLING Right ~ 2001  . CARDIAC CATHETERIZATION  09/19/2018  . COLONOSCOPY    . CORONARY ANGIOPLASTY WITH STENT PLACEMENT  1997   "2"  . ILIAC ARTERY STENT  04/22/2017   . Placement of a 6 mm x 100 mm Viabahn covered stent left SFA  . INTRAVASCULAR PRESSURE WIRE/FFR STUDY  09/19/2018  . INTRAVASCULAR PRESSURE  WIRE/FFR STUDY N/A 09/19/2018   Procedure: INTRAVASCULAR PRESSURE WIRE/FFR STUDY;  Surgeon: [name], MD;  Location: [facility];  Service: Cardiovascular;  Laterality: N/A;  . LEFT HEART CATH AND CORONARY ANGIOGRAPHY N/A 09/19/2018   Procedure: LEFT HEART CATH AND CORONARY ANGIOGRAPHY;  Surgeon: [name], MD;  Location: [facility] LAB;  Service: Cardiovascular;  Laterality: N/A;  . LEFT HEART CATHETERIZATION WITH CORONARY ANGIOGRAM N/A 02/19/2012   Procedure: LEFT HEART CATHETERIZATION WITH CORONARY ANGIOGRAM;  Surgeon: Larey Dresser, MD;  Location[facility] CATH LAB;  Service: Cardiovascular;  Laterality: N/A;  . LOWER EXTREMITY ANGIOGRAPHY Bilateral 04/22/2017   Procedure: Lower Extremity Angiography;  Surgeon: [name], MD;  Location: [facility] CV LAB;  Service: Cardiovascular;  Laterality: Bilateral;  . LOWER EXTREMITY ANGIOGRAPHY Bilateral 09/19/2018  . LOWER EXTREMITY  ANGIOGRAPHY Bilateral 09/19/2018   Procedure: LOWER EXTREMITY ANGIOGRAPHY;  Surgeon: [name], MD;  Location: [facility] CV LAB;  Service: Cardiovascular;  Laterality: Bilateral;  . PERIPHERAL ARTERIAL STENT GRAFT  09/2010   LLE  . PERIPHERAL VASCULAR ATHERECTOMY Left 04/22/2017   Procedure: Peripheral Vascular Atherectomy;  Surgeon: [name], MD;  Location: [facility] CV LAB;  Service: Cardiovascular;  Laterality: Left;  SFA  . PERIPHERAL VASCULAR INTERVENTION Left 04/22/2017   Procedure: Peripheral Vascular Intervention;  Surgeon: [name], MD;  Location: [facility] CV LAB;  Service: Cardiovascular;  Laterality: Left;  SFA  . UPPER GASTROINTESTINAL ENDOSCOPY      MEDICATIONS: . aspirin EC 81 MG tablet  . carvedilol (COREG) 3.125 MG tablet  . clopidogrel (PLAVIX) 75 MG tablet  . isosorbide mononitrate (IMDUR) 30 MG 24 hr tablet  . isosorbide mononitrate (IMDUR) 60 MG 24 hr tablet  . Multiple Vitamins-Minerals (MENS MULTIVITAMIN PLUS) TABS  . nitroGLYCERIN (NITROSTAT) 0.4 MG SL tablet  . rosuvastatin (CRESTOR) 40 MG tablet   No current facility-administered medications for this encounter.      Wynonia Musty [facility] Short Stay Center/Anesthesiology Phone [phone] 11/30/2018 4:31 PM

## 2018-11-30 NOTE — Anesthesia Preprocedure Evaluation (Addendum)
Anesthesia Evaluation  Patient identified by MRN, date of birth, ID band Patient awake    Reviewed: Allergy & Precautions, NPO status , Patient's Chart, lab work & pertinent test results, reviewed documented beta blocker date and time   Airway Mallampati: II  TM Distance: >3 FB Neck ROM: Full    Dental  (+) Dental Advisory Given, Lower Dentures, Upper Dentures   Pulmonary sleep apnea , former smoker,    Pulmonary exam normal breath sounds clear to auscultation       Cardiovascular hypertension, Pt. on home beta blockers and Pt. on medications + angina + CAD, + Past MI, + Cardiac Stents, + Peripheral Vascular Disease and + DVT  Normal cardiovascular exam+ dysrhythmias Atrial Fibrillation  Rhythm:Regular Rate:Normal  TTE 08/19/2018: Study Conclusions  - Left ventricle: The cavity size was normal. There was mild concentric hypertrophy. Systolic function was normal. The estimated ejection fraction was in the range of 60% to 65%. Wall motion was normal; there were no regional wall motion abnormalities. Doppler parameters are consistent with abnormal left ventricular relaxation (grade 1 diastolic dysfunction). Doppler parameters are consistent with elevated ventricular end-diastolic filling pressure. - Aortic valve: Trileaflet; mildly thickened, mildly calcified leaflets. There was mild regurgitation. - Aortic root: The aortic root was normal in size. - Mitral valve: There was mild regurgitation. - Left atrium: The atrium was normal in size. - Right ventricle: Systolic function was normal. - Right atrium: The atrium was normal in size. - Tricuspid valve: There was mild regurgitation. - Pulmonic valve: There was mild regurgitation. - Pulmonary arteries: Systolic pressure was within the normalrange. - Inferior vena cava: The vessel was normal in size. - Pericardium, extracardiac: There was no pericardial effusion.   Neuro/Psych  Headaches, CVA    GI/Hepatic Neg liver ROS, PUD, GERD  ,  Endo/Other  diabetes  Renal/GU negative Renal ROS     Musculoskeletal  (+) Arthritis ,   Abdominal   Peds  Hematology  (+) Blood dyscrasia (Plavix), ,   Anesthesia Other Findings Day of surgery medications reviewed with the patient.  Reproductive/Obstetrics                          Anesthesia Physical Anesthesia Plan  ASA: III  Anesthesia Plan: General   Post-op Pain Management:    Induction: Intravenous  PONV Risk Score and Plan: 2 and Dexamethasone, Ondansetron and Midazolam  Airway Management Planned: Oral ETT  Additional Equipment: Arterial line  Intra-op Plan:   Post-operative Plan: Extubation in OR  Informed Consent: I have reviewed the patients History and Physical, chart, labs and discussed the procedure including the risks, benefits and alternatives for the proposed anesthesia with the patient or authorized representative who has indicated his/her understanding and acceptance.     Dental advisory given  Plan Discussed with: CRNA  Anesthesia Plan Comments:        Anesthesia Quick Evaluation

## 2018-12-05 HISTORY — PX: BYPASS GRAFT: SHX909

## 2018-12-06 ENCOUNTER — Encounter (HOSPITAL_COMMUNITY): Payer: Self-pay | Admitting: *Deleted

## 2018-12-06 ENCOUNTER — Encounter (HOSPITAL_COMMUNITY)
Admission: RE | Disposition: A | Discharge status: Skilled Nursing Facility | Payer: Self-pay | Source: Home / Self Care | Attending: Vascular Surgery

## 2018-12-06 ENCOUNTER — Telehealth: Payer: Self-pay | Admitting: Vascular Surgery

## 2018-12-06 ENCOUNTER — Encounter (HOSPITAL_COMMUNITY): Payer: PPO

## 2018-12-06 ENCOUNTER — Inpatient Hospital Stay (HOSPITAL_COMMUNITY)
Admission: RE | Admit: 2018-12-06 | Discharge: 2018-12-12 | DRG: 253 | Disposition: A | Discharge status: Skilled Nursing Facility | Payer: PPO | Attending: Vascular Surgery | Admitting: Vascular Surgery

## 2018-12-06 ENCOUNTER — Inpatient Hospital Stay (HOSPITAL_COMMUNITY): Payer: PPO | Admitting: Physician Assistant

## 2018-12-06 ENCOUNTER — Other Ambulatory Visit: Payer: Self-pay

## 2018-12-06 DIAGNOSIS — Z48812 Encounter for surgical aftercare following surgery on the circulatory system: Secondary | ICD-10-CM | POA: Diagnosis not present

## 2018-12-06 DIAGNOSIS — Y718 Miscellaneous cardiovascular devices associated with adverse incidents, not elsewhere classified: Secondary | ICD-10-CM | POA: Diagnosis present

## 2018-12-06 DIAGNOSIS — I252 Old myocardial infarction: Secondary | ICD-10-CM

## 2018-12-06 DIAGNOSIS — E785 Hyperlipidemia, unspecified: Secondary | ICD-10-CM | POA: Diagnosis not present

## 2018-12-06 DIAGNOSIS — Z955 Presence of coronary angioplasty implant and graft: Secondary | ICD-10-CM

## 2018-12-06 DIAGNOSIS — I25110 Atherosclerotic heart disease of native coronary artery with unstable angina pectoris: Secondary | ICD-10-CM | POA: Diagnosis not present

## 2018-12-06 DIAGNOSIS — E1151 Type 2 diabetes mellitus with diabetic peripheral angiopathy without gangrene: Secondary | ICD-10-CM | POA: Diagnosis not present

## 2018-12-06 DIAGNOSIS — M1990 Unspecified osteoarthritis, unspecified site: Secondary | ICD-10-CM | POA: Diagnosis not present

## 2018-12-06 DIAGNOSIS — I10 Essential (primary) hypertension: Secondary | ICD-10-CM | POA: Diagnosis present

## 2018-12-06 DIAGNOSIS — Z79899 Other long term (current) drug therapy: Secondary | ICD-10-CM

## 2018-12-06 DIAGNOSIS — I8390 Asymptomatic varicose veins of unspecified lower extremity: Secondary | ICD-10-CM | POA: Diagnosis present

## 2018-12-06 DIAGNOSIS — R262 Difficulty in walking, not elsewhere classified: Secondary | ICD-10-CM | POA: Diagnosis not present

## 2018-12-06 DIAGNOSIS — Z87442 Personal history of urinary calculi: Secondary | ICD-10-CM

## 2018-12-06 DIAGNOSIS — I739 Peripheral vascular disease, unspecified: Secondary | ICD-10-CM | POA: Diagnosis present

## 2018-12-06 DIAGNOSIS — Z87891 Personal history of nicotine dependence: Secondary | ICD-10-CM | POA: Diagnosis not present

## 2018-12-06 DIAGNOSIS — K219 Gastro-esophageal reflux disease without esophagitis: Secondary | ICD-10-CM | POA: Diagnosis not present

## 2018-12-06 DIAGNOSIS — Z8249 Family history of ischemic heart disease and other diseases of the circulatory system: Secondary | ICD-10-CM | POA: Diagnosis not present

## 2018-12-06 DIAGNOSIS — Z86718 Personal history of other venous thrombosis and embolism: Secondary | ICD-10-CM

## 2018-12-06 DIAGNOSIS — I70213 Atherosclerosis of native arteries of extremities with intermittent claudication, bilateral legs: Secondary | ICD-10-CM | POA: Diagnosis not present

## 2018-12-06 DIAGNOSIS — T82856A Stenosis of peripheral vascular stent, initial encounter: Secondary | ICD-10-CM | POA: Diagnosis not present

## 2018-12-06 DIAGNOSIS — E1165 Type 2 diabetes mellitus with hyperglycemia: Secondary | ICD-10-CM | POA: Diagnosis not present

## 2018-12-06 DIAGNOSIS — Z7982 Long term (current) use of aspirin: Secondary | ICD-10-CM

## 2018-12-06 DIAGNOSIS — Z8711 Personal history of peptic ulcer disease: Secondary | ICD-10-CM | POA: Diagnosis not present

## 2018-12-06 DIAGNOSIS — M6281 Muscle weakness (generalized): Secondary | ICD-10-CM | POA: Diagnosis not present

## 2018-12-06 DIAGNOSIS — I2510 Atherosclerotic heart disease of native coronary artery without angina pectoris: Secondary | ICD-10-CM | POA: Diagnosis not present

## 2018-12-06 DIAGNOSIS — I70212 Atherosclerosis of native arteries of extremities with intermittent claudication, left leg: Secondary | ICD-10-CM | POA: Diagnosis not present

## 2018-12-06 DIAGNOSIS — M549 Dorsalgia, unspecified: Secondary | ICD-10-CM | POA: Diagnosis not present

## 2018-12-06 HISTORY — PX: FEMORAL-POPLITEAL BYPASS GRAFT: SHX937

## 2018-12-06 LAB — CBC
HCT: 34.4 % — ABNORMAL LOW (ref 39.0–52.0)
Hemoglobin: 11.2 g/dL — ABNORMAL LOW (ref 13.0–17.0)
MCH: 29 pg (ref 26.0–34.0)
MCHC: 32.6 g/dL (ref 30.0–36.0)
MCV: 89.1 fL (ref 80.0–100.0)
Platelets: 192 10*3/uL (ref 150–400)
RBC: 3.86 MIL/uL — ABNORMAL LOW (ref 4.22–5.81)
RDW: 13.6 % (ref 11.5–15.5)
WBC: 10 10*3/uL (ref 4.0–10.5)
nRBC: 0 % (ref 0.0–0.2)

## 2018-12-06 LAB — CREATININE, SERUM
Creatinine, Ser: 1.13 mg/dL (ref 0.61–1.24)
GFR calc Af Amer: >60 mL/min (ref >60)
GFR calc non Af Amer: >60 mL/min (ref >60)

## 2018-12-06 LAB — GLUCOSE, CAPILLARY
Glucose-Capillary: 185 mg/dL — ABNORMAL HIGH (ref 70–99)
Glucose-Capillary: 155 mg/dL — ABNORMAL HIGH (ref 70–99)
Glucose-Capillary: 283 mg/dL — ABNORMAL HIGH (ref 70–99)
Glucose-Capillary: 235 mg/dL — ABNORMAL HIGH (ref 70–99)

## 2018-12-06 LAB — HEMOGLOBIN A1C
Hgb A1c MFr Bld: 8.8 % — ABNORMAL HIGH (ref 4.8–5.6)
Mean Plasma Glucose: 205.86 mg/dL

## 2018-12-06 SURGERY — BYPASS GRAFT FEMORAL-POPLITEAL ARTERY
Anesthesia: General | Laterality: Left

## 2018-12-06 MED ORDER — ROCURONIUM BROMIDE 10 MG/ML (PF) SYRINGE
PREFILLED_SYRINGE | INTRAVENOUS | Status: DC | PRN
  Administered 2018-12-06: 30 mg via INTRAVENOUS
  Administered 2018-12-06: 20 mg via INTRAVENOUS
  Administered 2018-12-06: 50 mg via INTRAVENOUS

## 2018-12-06 MED ORDER — SODIUM CHLORIDE 0.9 % IV SOLN
INTRAVENOUS | Status: AC
  Filled 2018-12-06: qty 1.2

## 2018-12-06 MED ORDER — ACETAMINOPHEN 500 MG PO TABS
ORAL_TABLET | ORAL | Status: AC
  Filled 2018-12-06: qty 2

## 2018-12-06 MED ORDER — ISOSORBIDE MONONITRATE ER 60 MG PO TB24
60.0000 mg | ORAL_TABLET | Freq: Every day | ORAL | Status: DC
  Administered 2018-12-07 – 2018-12-12 (×6): 60 mg via ORAL
  Filled 2018-12-06 (×6): qty 1

## 2018-12-06 MED ORDER — SODIUM CHLORIDE 0.9 % IV SOLN: INTRAVENOUS | Status: DC

## 2018-12-06 MED ORDER — GUAIFENESIN-DM 100-10 MG/5ML PO SYRP: 15.0000 mL | ORAL_SOLUTION | ORAL | Status: DC | PRN

## 2018-12-06 MED ORDER — CARVEDILOL 3.125 MG PO TABS
ORAL_TABLET | ORAL | Status: AC
  Administered 2018-12-06: 3.125 mg
  Filled 2018-12-06: qty 1

## 2018-12-06 MED ORDER — 0.9 % SODIUM CHLORIDE (POUR BTL) OPTIME
TOPICAL | Status: DC | PRN
  Administered 2018-12-06: 2000 mL

## 2018-12-06 MED ORDER — SODIUM CHLORIDE 0.9 % IV SOLN
INTRAVENOUS | Status: DC
  Administered 2018-12-06: 75 mL/h via INTRAVENOUS
  Administered 2018-12-06: 20:00:00 via INTRAVENOUS

## 2018-12-06 MED ORDER — POTASSIUM CHLORIDE CRYS ER 20 MEQ PO TBCR: 20.0000 meq | EXTENDED_RELEASE_TABLET | Freq: Every day | ORAL | Status: DC | PRN

## 2018-12-06 MED ORDER — FENTANYL CITRATE (PF) 100 MCG/2ML IJ SOLN: 25.0000 ug | INTRAMUSCULAR | Status: DC | PRN

## 2018-12-06 MED ORDER — ONDANSETRON HCL 4 MG/2ML IJ SOLN
4.0000 mg | Freq: Once | INTRAMUSCULAR | Status: AC | PRN
  Administered 2018-12-06: 4 mg via INTRAVENOUS

## 2018-12-06 MED ORDER — OXYCODONE HCL 5 MG PO TABS
5.0000 mg | ORAL_TABLET | ORAL | Status: DC | PRN
  Administered 2018-12-06 – 2018-12-08 (×7): 10 mg via ORAL
  Filled 2018-12-06 (×2): qty 2
  Filled 2018-12-06: qty 1
  Filled 2018-12-06 (×5): qty 2

## 2018-12-06 MED ORDER — DEXAMETHASONE SODIUM PHOSPHATE 10 MG/ML IJ SOLN
INTRAMUSCULAR | Status: DC | PRN
  Administered 2018-12-06: 4 mg via INTRAVENOUS

## 2018-12-06 MED ORDER — CARVEDILOL 3.125 MG PO TABS
3.1250 mg | ORAL_TABLET | Freq: Two times a day (BID) | ORAL | Status: DC
  Administered 2018-12-06 – 2018-12-12 (×12): 3.125 mg via ORAL
  Filled 2018-12-06 (×12): qty 1

## 2018-12-06 MED ORDER — CLOPIDOGREL BISULFATE 75 MG PO TABS
75.0000 mg | ORAL_TABLET | Freq: Every day | ORAL | Status: DC
  Administered 2018-12-07 – 2018-12-12 (×6): 75 mg via ORAL
  Filled 2018-12-06 (×6): qty 1

## 2018-12-06 MED ORDER — LABETALOL HCL 5 MG/ML IV SOLN: 10.0000 mg | INTRAVENOUS | Status: DC | PRN

## 2018-12-06 MED ORDER — ROSUVASTATIN CALCIUM 20 MG PO TABS
40.0000 mg | ORAL_TABLET | Freq: Every day | ORAL | Status: DC
  Administered 2018-12-07 – 2018-12-12 (×6): 40 mg via ORAL
  Filled 2018-12-06 (×6): qty 2

## 2018-12-06 MED ORDER — MIDAZOLAM HCL 2 MG/2ML IJ SOLN
INTRAMUSCULAR | Status: AC
  Filled 2018-12-06: qty 2

## 2018-12-06 MED ORDER — ALUM & MAG HYDROXIDE-SIMETH 200-200-20 MG/5ML PO SUSP: 15.0000 mL | ORAL | Status: DC | PRN

## 2018-12-06 MED ORDER — CHLORHEXIDINE GLUCONATE CLOTH 2 % EX PADS: 6.0000 | MEDICATED_PAD | Freq: Once | CUTANEOUS | Status: DC

## 2018-12-06 MED ORDER — HYDROMORPHONE HCL 1 MG/ML IJ SOLN
0.5000 mg | INTRAMUSCULAR | Status: DC | PRN
  Administered 2018-12-06: 0.5 mg via INTRAVENOUS
  Administered 2018-12-07 (×2): 1 mg via INTRAVENOUS
  Filled 2018-12-06 (×3): qty 1

## 2018-12-06 MED ORDER — FENTANYL CITRATE (PF) 100 MCG/2ML IJ SOLN
INTRAMUSCULAR | Status: AC
  Filled 2018-12-06: qty 2

## 2018-12-06 MED ORDER — ACETAMINOPHEN 325 MG RE SUPP: 325.0000 mg | RECTAL | Status: DC | PRN

## 2018-12-06 MED ORDER — METOPROLOL TARTRATE 5 MG/5ML IV SOLN: 2.0000 mg | INTRAVENOUS | Status: DC | PRN

## 2018-12-06 MED ORDER — SODIUM CHLORIDE 0.9 % IV SOLN
INTRAVENOUS | Status: DC | PRN
  Administered 2018-12-06: 50 ug/min via INTRAVENOUS

## 2018-12-06 MED ORDER — EPHEDRINE SULFATE-NACL 50-0.9 MG/10ML-% IV SOSY
PREFILLED_SYRINGE | INTRAVENOUS | Status: DC | PRN
  Administered 2018-12-06 (×2): 5 mg via INTRAVENOUS

## 2018-12-06 MED ORDER — HEPARIN SODIUM (PORCINE) 5000 UNIT/ML IJ SOLN
5000.0000 [IU] | Freq: Three times a day (TID) | INTRAMUSCULAR | Status: DC
  Administered 2018-12-06 – 2018-12-12 (×17): 5000 [IU] via SUBCUTANEOUS
  Filled 2018-12-06 (×17): qty 1

## 2018-12-06 MED ORDER — ONDANSETRON HCL 4 MG/2ML IJ SOLN
INTRAMUSCULAR | Status: AC
  Filled 2018-12-06: qty 2

## 2018-12-06 MED ORDER — INSULIN ASPART 100 UNIT/ML ~~LOC~~ SOLN
0.0000 [IU] | Freq: Three times a day (TID) | SUBCUTANEOUS | Status: DC
  Administered 2018-12-06: 5 [IU] via SUBCUTANEOUS
  Administered 2018-12-07 (×3): 3 [IU] via SUBCUTANEOUS
  Administered 2018-12-08 – 2018-12-09 (×6): 2 [IU] via SUBCUTANEOUS
  Administered 2018-12-10: 1 [IU] via SUBCUTANEOUS
  Administered 2018-12-10 – 2018-12-11 (×5): 2 [IU] via SUBCUTANEOUS
  Administered 2018-12-12: 3 [IU] via SUBCUTANEOUS
  Administered 2018-12-12: 2 [IU] via SUBCUTANEOUS

## 2018-12-06 MED ORDER — CARVEDILOL 3.125 MG PO TABS
ORAL_TABLET | ORAL | Status: AC
  Filled 2018-12-06: qty 1

## 2018-12-06 MED ORDER — ACETAMINOPHEN 325 MG PO TABS: 325.0000 mg | ORAL_TABLET | ORAL | Status: DC | PRN

## 2018-12-06 MED ORDER — BISACODYL 5 MG PO TBEC
5.0000 mg | DELAYED_RELEASE_TABLET | Freq: Every day | ORAL | Status: DC | PRN
  Administered 2018-12-11: 5 mg via ORAL
  Filled 2018-12-06: qty 1

## 2018-12-06 MED ORDER — FENTANYL CITRATE (PF) 250 MCG/5ML IJ SOLN
INTRAMUSCULAR | Status: AC
  Filled 2018-12-06: qty 5

## 2018-12-06 MED ORDER — DOCUSATE SODIUM 100 MG PO CAPS
100.0000 mg | ORAL_CAPSULE | Freq: Every day | ORAL | Status: DC
  Administered 2018-12-07 – 2018-12-12 (×6): 100 mg via ORAL
  Filled 2018-12-06 (×6): qty 1

## 2018-12-06 MED ORDER — ASPIRIN EC 81 MG PO TBEC
81.0000 mg | DELAYED_RELEASE_TABLET | Freq: Every day | ORAL | Status: DC
  Administered 2018-12-07 – 2018-12-12 (×6): 81 mg via ORAL
  Filled 2018-12-06 (×6): qty 1

## 2018-12-06 MED ORDER — PROPOFOL 10 MG/ML IV BOLUS
INTRAVENOUS | Status: DC | PRN
  Administered 2018-12-06: 100 mg via INTRAVENOUS
  Administered 2018-12-06: 50 mg via INTRAVENOUS

## 2018-12-06 MED ORDER — CEFAZOLIN SODIUM-DEXTROSE 2-4 GM/100ML-% IV SOLN
2.0000 g | Freq: Three times a day (TID) | INTRAVENOUS | Status: AC
  Administered 2018-12-06 (×2): 2 g via INTRAVENOUS
  Filled 2018-12-06 (×2): qty 100

## 2018-12-06 MED ORDER — HEPARIN SODIUM (PORCINE) 1000 UNIT/ML IJ SOLN
INTRAMUSCULAR | Status: DC | PRN
  Administered 2018-12-06: 8000 [IU] via INTRAVENOUS

## 2018-12-06 MED ORDER — PROPOFOL 10 MG/ML IV BOLUS
INTRAVENOUS | Status: AC
  Filled 2018-12-06: qty 20

## 2018-12-06 MED ORDER — CEFAZOLIN SODIUM-DEXTROSE 2-4 GM/100ML-% IV SOLN
2.0000 g | INTRAVENOUS | Status: AC
  Administered 2018-12-06: 2 g via INTRAVENOUS

## 2018-12-06 MED ORDER — LACTATED RINGERS IV SOLN
INTRAVENOUS | Status: DC | PRN
  Administered 2018-12-06: 07:00:00 via INTRAVENOUS

## 2018-12-06 MED ORDER — PANTOPRAZOLE SODIUM 40 MG PO TBEC
40.0000 mg | DELAYED_RELEASE_TABLET | Freq: Every day | ORAL | Status: DC
  Administered 2018-12-07 – 2018-12-12 (×6): 40 mg via ORAL
  Filled 2018-12-06 (×6): qty 1

## 2018-12-06 MED ORDER — ESMOLOL HCL 100 MG/10ML IV SOLN
INTRAVENOUS | Status: DC | PRN
  Administered 2018-12-06: 30 mg via INTRAVENOUS

## 2018-12-06 MED ORDER — PHENOL 1.4 % MT LIQD: 1.0000 | OROMUCOSAL | Status: DC | PRN

## 2018-12-06 MED ORDER — FENTANYL CITRATE (PF) 100 MCG/2ML IJ SOLN
INTRAMUSCULAR | Status: DC | PRN
  Administered 2018-12-06 (×3): 50 ug via INTRAVENOUS
  Administered 2018-12-06: 100 ug via INTRAVENOUS

## 2018-12-06 MED ORDER — ISOSORBIDE MONONITRATE ER 60 MG PO TB24
60.0000 mg | ORAL_TABLET | Freq: Once | ORAL | Status: AC
  Administered 2018-12-06: 60 mg via ORAL
  Filled 2018-12-06: qty 1

## 2018-12-06 MED ORDER — HYDRALAZINE HCL 20 MG/ML IJ SOLN: 5.0000 mg | INTRAMUSCULAR | Status: DC | PRN

## 2018-12-06 MED ORDER — SENNOSIDES-DOCUSATE SODIUM 8.6-50 MG PO TABS
1.0000 | ORAL_TABLET | Freq: Every evening | ORAL | Status: DC | PRN
  Administered 2018-12-10: 1 via ORAL
  Filled 2018-12-06: qty 1

## 2018-12-06 MED ORDER — ACETAMINOPHEN 500 MG PO TABS
1000.0000 mg | ORAL_TABLET | Freq: Once | ORAL | Status: AC
  Administered 2018-12-06: 1000 mg via ORAL

## 2018-12-06 MED ORDER — ONDANSETRON HCL 4 MG/2ML IJ SOLN
4.0000 mg | Freq: Four times a day (QID) | INTRAMUSCULAR | Status: DC | PRN
Start: 2018-12-06 — End: 2018-12-12
  Administered 2018-12-06: 4 mg via INTRAVENOUS
  Filled 2018-12-06 (×2): qty 2

## 2018-12-06 MED ORDER — SODIUM CHLORIDE 0.9 % IV SOLN
INTRAVENOUS | Status: DC | PRN
  Administered 2018-12-06: 07:00:00

## 2018-12-06 MED ORDER — NITROGLYCERIN 0.4 MG SL SUBL: 0.4000 mg | SUBLINGUAL_TABLET | SUBLINGUAL | Status: DC | PRN

## 2018-12-06 MED ORDER — SODIUM CHLORIDE 0.9 % IV SOLN
500.0000 mL | Freq: Once | INTRAVENOUS | Status: AC | PRN
  Administered 2018-12-06: 500 mL via INTRAVENOUS

## 2018-12-06 MED ORDER — MAGNESIUM SULFATE 2 GM/50ML IV SOLN: 2.0000 g | Freq: Every day | INTRAVENOUS | Status: DC | PRN

## 2018-12-06 MED ORDER — LIDOCAINE 2% (20 MG/ML) 5 ML SYRINGE
INTRAMUSCULAR | Status: DC | PRN
  Administered 2018-12-06: 60 mg via INTRAVENOUS

## 2018-12-06 MED ORDER — PROTAMINE SULFATE 10 MG/ML IV SOLN
INTRAVENOUS | Status: DC | PRN
  Administered 2018-12-06: 50 mg via INTRAVENOUS

## 2018-12-06 MED ORDER — CALCIUM CHLORIDE 10 % IV SOLN
INTRAVENOUS | Status: AC
  Filled 2018-12-06: qty 10

## 2018-12-06 MED ORDER — SUGAMMADEX SODIUM 200 MG/2ML IV SOLN
INTRAVENOUS | Status: DC | PRN
  Administered 2018-12-06: 180 mg via INTRAVENOUS

## 2018-12-06 MED ORDER — ONDANSETRON HCL 4 MG/2ML IJ SOLN
INTRAMUSCULAR | Status: DC | PRN
  Administered 2018-12-06: 4 mg via INTRAVENOUS

## 2018-12-06 SURGICAL SUPPLY — 59 items
ADH SKN CLS APL DERMABOND .7 (GAUZE/BANDAGES/DRESSINGS) ×1
BANDAGE ESMARK 6X9 LF (GAUZE/BANDAGES/DRESSINGS) IMPLANT
BNDG CMPR 9X6 STRL LF SNTH (GAUZE/BANDAGES/DRESSINGS)
BNDG ESMARK 6X9 LF (GAUZE/BANDAGES/DRESSINGS)
CANISTER SUCT 3000ML PPV (MISCELLANEOUS) ×3 IMPLANT
CANNULA VESSEL 3MM 2 BLNT TIP (CANNULA) IMPLANT
CLIP VESOCCLUDE MED 24/CT (CLIP) ×3 IMPLANT
CLIP VESOCCLUDE SM WIDE 24/CT (CLIP) ×3 IMPLANT
COVER WAND RF STERILE (DRAPES) ×3 IMPLANT
CUFF TOURNIQUET SINGLE 24IN (TOURNIQUET CUFF) IMPLANT
CUFF TOURNIQUET SINGLE 34IN LL (TOURNIQUET CUFF) IMPLANT
CUFF TOURNIQUET SINGLE 44IN (TOURNIQUET CUFF) IMPLANT
DERMABOND ADVANCED (GAUZE/BANDAGES/DRESSINGS) ×2
DERMABOND ADVANCED .7 DNX12 (GAUZE/BANDAGES/DRESSINGS) ×1 IMPLANT
DRAIN CHANNEL 15F RND FF W/TCR (WOUND CARE) IMPLANT
DRAPE C-ARM 42X72 X-RAY (DRAPES) IMPLANT
DRAPE HALF SHEET 40X57 (DRAPES) IMPLANT
DRAPE X-RAY CASS 24X20 (DRAPES) IMPLANT
ELECT REM PT RETURN 9FT ADLT (ELECTROSURGICAL) ×3
ELECTRODE REM PT RTRN 9FT ADLT (ELECTROSURGICAL) ×1 IMPLANT
EVACUATOR SILICONE 100CC (DRAIN) IMPLANT
GAUZE 4X4 16PLY RFD (DISPOSABLE) ×2 IMPLANT
GLOVE BIO SURGEON STRL SZ7.5 (GLOVE) ×3 IMPLANT
GLOVE BIOGEL PI IND STRL 7.0 (GLOVE) IMPLANT
GLOVE BIOGEL PI INDICATOR 7.0 (GLOVE) ×2
GLOVE ECLIPSE 7.0 STRL STRAW (GLOVE) ×2 IMPLANT
GOWN STRL REUS W/ TWL LRG LVL3 (GOWN DISPOSABLE) IMPLANT
GOWN STRL REUS W/ TWL XL LVL3 (GOWN DISPOSABLE) ×1 IMPLANT
GOWN STRL REUS W/TWL LRG LVL3 (GOWN DISPOSABLE) ×9
GOWN STRL REUS W/TWL XL LVL3 (GOWN DISPOSABLE) ×3
HEMOSTAT SNOW SURGICEL 2X4 (HEMOSTASIS) IMPLANT
INSERT FOGARTY SM (MISCELLANEOUS) ×2 IMPLANT
KIT BASIN OR (CUSTOM PROCEDURE TRAY) ×3 IMPLANT
KIT TURNOVER KIT B (KITS) ×3 IMPLANT
MARKER GRAFT CORONARY BYPASS (MISCELLANEOUS) IMPLANT
NS IRRIG 1000ML POUR BTL (IV SOLUTION) ×6 IMPLANT
PACK PERIPHERAL VASCULAR (CUSTOM PROCEDURE TRAY) ×3 IMPLANT
PAD ARMBOARD 7.5X6 YLW CONV (MISCELLANEOUS) ×6 IMPLANT
SET COLLECT BLD 21X3/4 12 (NEEDLE) IMPLANT
SPONGE LAP 18X18 RF (DISPOSABLE) ×4 IMPLANT
STOPCOCK 4 WAY LG BORE MALE ST (IV SETS) IMPLANT
SUT ETHILON 3 0 PS 1 (SUTURE) IMPLANT
SUT GORETEX 6.0 TT13 (SUTURE) IMPLANT
SUT GORETEX 6.0 TT9 (SUTURE) IMPLANT
SUT MNCRL AB 4-0 PS2 18 (SUTURE) ×6 IMPLANT
SUT PROLENE 5 0 C 1 24 (SUTURE) ×5 IMPLANT
SUT PROLENE 6 0 BV (SUTURE) ×7 IMPLANT
SUT PROLENE 7 0 BV 1 (SUTURE) IMPLANT
SUT SILK 2 0 SH (SUTURE) ×5 IMPLANT
SUT SILK 3 0 (SUTURE) ×3
SUT SILK 3-0 18XBRD TIE 12 (SUTURE) IMPLANT
SUT VIC AB 2-0 CT1 27 (SUTURE) ×6
SUT VIC AB 2-0 CT1 TAPERPNT 27 (SUTURE) ×2 IMPLANT
SUT VIC AB 3-0 SH 27 (SUTURE) ×9
SUT VIC AB 3-0 SH 27X BRD (SUTURE) ×2 IMPLANT
TOWEL GREEN STERILE (TOWEL DISPOSABLE) ×3 IMPLANT
TRAY FOLEY MTR SLVR 16FR STAT (SET/KITS/TRAYS/PACK) ×3 IMPLANT
UNDERPAD 30X30 (UNDERPADS AND DIAPERS) ×3 IMPLANT
WATER STERILE IRR 1000ML POUR (IV SOLUTION) ×3 IMPLANT

## 2018-12-06 NOTE — H&P (Signed)
Patient ID: Harold Waters, male   DOB: 08/06/1946, 73 y.o.   MRN: [id number]  Reason for Consult: New Patient (Initial Visit)   Referred by Plotnikov, [name], MD  Subjective:    Subjective    HPI:  Harold Waters is a 73 y.o. male follows up with vein mapping for left lower extremity pain.  He has claudication very short distances but continues to work.  He also has swelling of his left lower extremity.  He does not have any tissue loss or ulceration.  He has previous stenting in his left SFA that are known to be occluded.  I had evaluated him in the hospital and he is here today for further discussion of surgery.      Past Medical History:  Diagnosis Date  . Angina   . Chronic stomach ulcer    "get them off and on" (09/19/2018)  . Coronary atherosclerosis of unspecified type of vessel, native or graft   . Diverticulosis   . DVT of lower extremity (deep venous thrombosis) ([place]) ~ 2010   LLE  . Erosive gastritis   . Essential hypertension, benign   . GERD (gastroesophageal reflux disease)   . Headache(784.0) 02/18/12   "lately"  . History of kidney stones   . Internal hemorrhoids   . Lower back pain   . Myocardial infarction ([place]) 1997  . Osteoarthritis   . Other and unspecified hyperlipidemia   . PAD (peripheral artery disease) (HCC)    with ABI's 0.8 on the right and 0.86 on the left  . Pneumonia 1957  . PSVT (paroxysmal supraventricular tachycardia) ([place]) 02/18/12  . Shortness of breath    "lying down"  . Sleep apnea    does not wear c-pap; pt does not recall this hx on 09/19/2018        Family History  Problem Relation Age of Onset  . Ulcers Father        had stomach issues, not sure what happened  . Coronary artery disease Brother        male 1st degree relative <50  . Colon cancer Neg Hx   . Esophageal cancer Neg Hx   . Rectal cancer Neg Hx   . Stomach cancer Neg Hx         Past Surgical History:  Procedure  Laterality Date  . ARTHROSCOPY KNEE W/ DRILLING Right ~ 2001  . CARDIAC CATHETERIZATION  09/19/2018  . COLONOSCOPY    . CORONARY ANGIOPLASTY WITH STENT PLACEMENT  1997   "2"  . ILIAC ARTERY STENT  04/22/2017   . Placement of a 6 mm x 100 mm Viabahn covered stent left SFA  . INTRAVASCULAR PRESSURE WIRE/FFR STUDY  09/19/2018  . INTRAVASCULAR PRESSURE WIRE/FFR STUDY N/A 09/19/2018   Procedure: INTRAVASCULAR PRESSURE WIRE/FFR STUDY;  Surgeon: [name], MD;  Location: [facility] CV LAB;  Service: Cardiovascular;  Laterality: N/A;  . LEFT HEART CATH AND CORONARY ANGIOGRAPHY N/A 09/19/2018   Procedure: LEFT HEART CATH AND CORONARY ANGIOGRAPHY;  Surgeon: [name], MD;  Location: [facility] LAB;  Service: Cardiovascular;  Laterality: N/A;  . LEFT HEART CATHETERIZATION WITH CORONARY ANGIOGRAM N/A 02/19/2012   Procedure: LEFT HEART CATHETERIZATION WITH CORONARY ANGIOGRAM;  Surgeon: Larey Dresser, MD;  Location: [facility] CATH LAB;  Service: Cardiovascular;  Laterality: N/A;  . LOWER EXTREMITY ANGIOGRAPHY Bilateral 04/22/2017   Procedure: Lower Extremity Angiography;  Surgeon: [name], MD;  Location: [place] CV LAB;  Service: Cardiovascular;  Laterality: Bilateral;  . LOWER EXTREMITY ANGIOGRAPHY Bilateral 09/19/2018  . LOWER EXTREMITY ANGIOGRAPHY Bilateral 09/19/2018   Procedure: LOWER EXTREMITY ANGIOGRAPHY;  Surgeon: [name], MD;  Location: [place] [facility] LAB;  Service: Cardiovascular;  Laterality: Bilateral;  . PERIPHERAL ARTERIAL STENT GRAFT  09/2010   LLE  . PERIPHERAL VASCULAR ATHERECTOMY Left 04/22/2017   Procedure: Peripheral Vascular Atherectomy;  Surgeon: [name], MD;  Location: [place] CV LAB;  Service: Cardiovascular;  Laterality: Left;  SFA  . PERIPHERAL VASCULAR INTERVENTION Left 04/22/2017   Procedure: Peripheral Vascular Intervention;  Surgeon: [name], MD;  Location: [place] [facility] CV LAB;  Service:  Cardiovascular;  Laterality: Left;  SFA  . UPPER GASTROINTESTINAL ENDOSCOPY      Short Social History:  Social History        Tobacco Use  . Smoking status: Former Smoker    Packs/day: 1.00    Years: 40.00    Pack years: 40.00    Types: Cigarettes    Last attempt to quit: 04/03/2010    Years since quitting: 8.5  . Smokeless tobacco: Never Used  Substance Use Topics  . Alcohol use: Not Currently    Comment: "used to drink when I was young; quit ~ 1980's"        Allergies  Allergen Reactions  . Eggs Or Egg-Derived Products Itching          Current Outpatient Medications  Medication Sig Dispense Refill  . aspirin EC 81 MG tablet Take 1 tablet (81 mg total) by mouth daily. (Patient taking differently: Take 81 mg by mouth at bedtime. )    . carvedilol (COREG) 3.125 MG tablet Take 1 tablet (3.125 mg total) by mouth 2 (two) times daily with a meal. 120 tablet 4  . isosorbide mononitrate (IMDUR) 30 MG 24 hr tablet Take 1 tablet (30 mg total) by mouth daily. (Patient taking differently: Take 30 mg by mouth daily at 3 pm. ) 90 tablet 3  . Multiple Vitamins-Minerals (MENS MULTIVITAMIN PLUS) TABS Take 1 tablet by mouth at bedtime.     . rosuvastatin (CRESTOR) 40 MG tablet Take 1 tablet (40 mg total) by mouth daily. (Patient taking differently: Take 40 mg by mouth at bedtime. ) 30 tablet 0  . rosuvastatin (CRESTOR) 40 MG tablet TAKE 1 TABLET BY MOUTH DAILY. PLEASE MAKE OVERDUE YEARLY APPT WITH DR. [name] BEFORE ANYMORE REFILLS 90 tablet 3   No current facility-administered medications for this visit.     Review of Systems  Eyes: Eyes negative.  Respiratory: Positive for shortness of breath and wheezing.  Cardiovascular: Positive for claudication, dyspnea with exertion and leg swelling.  Musculoskeletal: Positive for gait problem, leg pain and joint pain.  Hematologic: Hematologic/lymphatic negative.  Psychiatric: Psychiatric negative.          Objective:   Objective        Vitals:   10/14/18 1120  BP: 131/68  Pulse: 64  Resp: 16  Temp: (!) 97 F (36.1 C)  TempSrc: Oral  SpO2: 94%  Weight: 170 lb (77.1 kg)  Height: 5\' 5"  (1.651 m)   Body mass index is 28.29 kg/m.  Physical Exam Constitutional:      Appearance: Normal appearance.  Neck:     Musculoskeletal: Neck supple.  Cardiovascular:     Rate and Rhythm: Normal rate.     Pulses:          Femoral pulses are 2+ on the right side and 2+ on the left side.  Popliteal pulses are 2+ on the right side and 0 on the left side.       Dorsalis pedis pulses are 2+ on the right side and 0 on the left side.  Abdominal:     Palpations: Abdomen is soft.  Musculoskeletal: Normal range of motion.  Skin:    General: Skin is warm.     Capillary Refill: Capillary refill takes less than 2 seconds.  Neurological:     General: No focal deficit present.     Mental Status: He is alert.  Psychiatric:        Mood and Affect: Mood normal.        Behavior: Behavior normal.        Thought Content: Thought content normal.        Judgment: Judgment normal.     Data: I have independently interpreted his vein mapping which demonstrates suitable vein in the left lower extremity for use although there is area of varicosities distally.  The vein does measure up to 0.77 cm in the distal thigh and there are fiber strands there.     Assessment/Plan:   Assessment    73 year old male with history of left SFA stenting for claudication that have occluded and claudication has recurred.  I discussed with him that this is not a life-threatening situation given that he has coronary disease he would be at some risk for surgery.  Vein mapping demonstrated suitable vein at least the level of the knee although there are fibrin strands.  It is possible he could get a femoral to above-knee popliteal bypass with vein.  This may also help with his leg swelling and his varicose veins as  well.  I discussed with him the need for a 3 to 5-day hospitalization should his heart do fine from surgery.  Of also discussed he would likely need rehab given his home situation and not return to work for period of at least 6 weeks.  He demonstrates good understanding and wants to wait until next year to consider surgical intervention.   Brandon C. Donzetta Matters, MD [facility] Office: [phone] Pager: [phone]

## 2018-12-06 NOTE — Transfer of Care (Signed)
Immediate Anesthesia Transfer of Care Note  Patient: Harold Waters  Procedure(s) Performed: BYPASS GRAFT FEMORAL-POPLITEAL ARTERY (Left )  Patient Location: PACU  Anesthesia Type:General  Level of Consciousness: awake, alert , oriented and patient cooperative  Airway & Oxygen Therapy: Patient Spontanous Breathing and Patient connected to face mask oxygen  Post-op Assessment: Report given to RN and Post -op Vital signs reviewed and stable  Post vital signs: Reviewed and stable  Last Vitals:  Vitals Value Taken Time  BP 139/86 12/06/2018 10:33 AM  Temp 36.6 C 12/06/2018 10:32 AM  Pulse 66 12/06/2018 10:39 AM  Resp 12 12/06/2018 10:39 AM  SpO2 99 % 12/06/2018 10:39 AM  Vitals shown include unvalidated device data.  Last Pain:  Vitals:   12/06/18 1032  TempSrc:   PainSc: Asleep         Complications: No apparent anesthesia complications

## 2018-12-06 NOTE — Discharge Instructions (Signed)
 [facility]  Discharge instructions  Lower Extremity Bypass Surgery  Please refer to the following instruction for your post-procedure care. Your surgeon or physician assistant will discuss any changes with you.  Activity  You are encouraged to walk as much as you can. You can slowly return to normal activities during the month after your surgery. Avoid strenuous activity and heavy lifting until your doctor tells you it's OK. Avoid activities such as vacuuming or swinging a golf club. Do not drive until your doctor give the OK and you are no longer taking prescription pain medications. It is also normal to have difficulty with sleep habits, eating and bowel movement after surgery. These will go away with time.  Bathing/Showering  You may shower after you go home. Do not soak in a bathtub, hot tub, or swim until the incision heals completely.  Incision Care  Clean your incision with mild soap and water. Shower every day. Pat the area dry with a clean towel. You do not need a bandage unless otherwise instructed. Do not apply any ointments or creams to your incision. If you have open wounds you will be instructed how to care for them or a visiting nurse may be arranged for you. If you have staples or sutures along your incision they will be removed at your post-op appointment. You may have skin glue on your incision. Do not peel it off. It will come off on its own in about one week. If you have a great deal of moisture in your groin, use a gauze help keep this area dry.  Diet  Resume your normal diet. There are no special food restrictions following this procedure. A low fat/ low cholesterol diet is recommended for all patients with vascular disease. In order to heal from your surgery, it is CRITICAL to get adequate nutrition. Your body requires vitamins, minerals, and protein. Vegetables are the best source of vitamins and minerals. Vegetables also provide the  perfect balance of protein. Processed food has little nutritional value, so try to avoid this.  Medications  Resume taking all your medications unless your doctor or nurse practitioner tells you not to. If your incision is causing pain, you may take over-the-counter pain relievers such as acetaminophen (Tylenol). If you were prescribed a stronger pain medication, please aware these medication can cause nausea and constipation. Prevent nausea by taking the medication with a snack or meal. Avoid constipation by drinking plenty of fluids and eating foods with high amount of fiber, such as fruits, vegetables, and grains. Take Colase 100 mg (an over-the-counter stool softener) twice a day as needed for constipation. Do not take Tylenol if you are taking prescription pain medications.  Follow Up  Our office will schedule a follow up appointment 2-3 weeks following discharge.  Please call us immediately for any of the following conditions  Severe or worsening pain in your legs or feet while at rest or while walking Increase pain, redness, warmth, or drainage (pus) from your incision site(s) Fever of 101 degree or higher The swelling in your leg with the bypass suddenly worsens and becomes more painful than when you were in the hospital If you have been instructed to feel your graft pulse then you should do so every day. If you can no longer feel this pulse, call the office immediately. Not all patients are given this instruction.  Leg swelling is common after leg bypass surgery.  The swelling should improve over a few months   following surgery. To improve the swelling, you may elevate your legs above the level of your heart while you are sitting or resting. Your surgeon or physician assistant may ask you to apply an ACE wrap or wear compression (TED) stockings to help to reduce swelling.  Reduce your risk of vascular disease  Stop smoking. If you would like help call [facility] at 1-800-QUIT-NOW  (1-800-784-8669) or [place] at 336-586-4000.  Manage your cholesterol Maintain a desired weight Control your diabetes weight Control your diabetes Keep your blood pressure down  If you have any questions, please call the office at [phone]   

## 2018-12-06 NOTE — Op Note (Signed)
Patient name: Harold Waters MRN: [id number] DOB: Jan 09, 1946 Sex: male  12/06/2018 Pre-operative Diagnosis: Life limiting left lower extremity claudication Post-operative diagnosis:  Same Surgeon:  Eda Paschal. Donzetta Matters, MD Assistant: Laurence Slate, PA Procedure Performed: 1.  Harvest left greater saphenous vein 2.  Left common femoral to above-knee popliteal artery bypass with ipsilateral reversed greater saphenous vein  Indications: 73 year old male with a history of life limiting claudication.  He has previous stents of his SFA that are now occluded.  He is indicated for femoral to popliteal artery bypass appears to have sufficient vein by preoperative vein mapping.  Findings: Common femoral artery was soft there was a strong palpable pulse there.  Above the knee the popliteal artery was soft as well and was amenable for bypass.  Saphenous vein was large throughout its course measuring approximately 5 mm in diameter.  Below the knee it was quite varicose with very thin and purple discolored.  We did have another vein and it was easily reversed.  At completion a very strong posterior tibial and anterior tibial signals that completely augmented with compression of the vein bypass.   Procedure:  The patient was identified in the holding area and taken to the operating room where he was placed upon the operating table and general anesthesia was induced.  He was sterilely prepped and draped the left lower extremity in the usual fashion, antibiotics were administered and a timeout was called.  I began with transverse incision of the groin dissected down to the palpable common femoral artery.  Placed a vessel loop around the common femoral artery as well as profunda and superficial femoral arteries.  Identified the greater saphenous vein through the same incision.  I divided branches between clips and ties all the way back to the common femoral vein saphenofemoral junction.  I then made it to skip incision in  the thigh dissecting out the vein again and taking branches between clips and ties.  My next incision was dissected at the popliteal artery.  Do this above the knee identified a soft artery that was amenable to bypass consistent with preoperative angiography.  I then went below the knees ultrasound to identify the greater saphenous vein did become quite varicose just beyond this.  I made an incision dissected out the vein.  I was able to trace it through the above-knee popliteal artery exposure.  I then made one further skip incision for a total of 5 incisions.  All branches were taken between clips and ties.  Distally I clamped the vein and tied it off.  At the saphenofemoral junction I placed a side-biting clamp transected it and oversewed it with a 5-0 Prolene suture in a running mattress fashion.  The vein was then prepared on the back table.  A tunnel was placed from the above-knee popliteal to the common femoral exposure sites.  Patient was fully heparinized.  I reversed the vein and spatulated did remove a valve.  I clamped the common femoral artery as well as profunda and superficial femoral arteries and opened it longitudinally where there is minimal disease.  I did have good backbleeding from both vessels irrigated with heparinized saline.  I sewed the vein end-to-side with 5-0 Prolene suture.  1 completion I did have good pulsatility through the vein graft.  A marker for orientation tunnel into the above-knee popliteal space.  Popliteal artery was clamped distally proximally opened longitudinally.  I again tested for backbleeding which was good and irrigated with heparinized saline.  I straighten the leg and trimmed the vein to size.  The vein was then spatulated sewn end-to-side with 6-0 Prolene suture.  Prior to completion anastomosis allowed flushing in all directions.  Upon completion there was good pulsatility distal to the vein bypass.  There was very strong anterior tibial and posterior tibial  signals at the ankle fully augmented with compression of the vein graft.  Satisfied with this 50 mg of protamine was administered.  Obtain hemostasis irrigated all wounds closed in layers with Vicryl and Monocryl.  Dermabond was placed to level the skin.  He was let awaken anesthesia having tolerated procedure well immediate complication.  All counts were correct at completion.  EBL: 300 cc.    Hisashi Amadon C. Donzetta Matters, MD [facility] Office: [phone] Pager: [phone]

## 2018-12-06 NOTE — Telephone Encounter (Signed)
sch appt lvm mld ltr 12/23/2018 245pm p/o MD

## 2018-12-06 NOTE — Anesthesia Procedure Notes (Signed)
Procedure Name: Intubation Date/Time: 12/06/2018 7:47 AM Performed by: Catalina Gravel, MD Pre-anesthesia Checklist: Patient identified, Emergency Drugs available, Suction available, Patient being monitored and Timeout performed Patient Re-evaluated:Patient Re-evaluated prior to induction Oxygen Delivery Method: Circle system utilized Preoxygenation: Pre-oxygenation with 100% oxygen Induction Type: IV induction Ventilation: Oral airway inserted - appropriate to patient size and Mask ventilation without difficulty Laryngoscope Size: 2 and Miller Grade View: Grade I Tube type: Oral Tube size: 8.0 mm Number of attempts: 1 Airway Equipment and Method: Stylet Placement Confirmation: ETT inserted through vocal cords under direct vision,  positive ETCO2,  CO2 detector and breath sounds checked- equal and bilateral Tube secured with: Tape Dental Injury: Teeth and Oropharynx as per pre-operative assessment  Comments: Performed by Kathrin Penner

## 2018-12-06 NOTE — Telephone Encounter (Signed)
-----   Message from Ulyses Amor, [place] sent at 12/06/2018 10:23 AM EST -----  S/P left fem to above knee pop bypass f/u with Dr. Donzetta Matters in 2-3 weeks

## 2018-12-06 NOTE — Anesthesia Procedure Notes (Signed)
Arterial Line Insertion Start/End2/02/2019 7:05 AM, 12/06/2018 7:15 AM Performed by: Catalina Gravel, MD, Cleda Daub, CRNA  Patient location: Pre-op. Preanesthetic checklist: patient identified, IV checked, site marked, risks and benefits discussed, surgical consent, monitors and equipment checked, pre-op evaluation and anesthesia consent Lidocaine 1% used for infiltration Left, radial was placed Catheter size: 20 G Hand hygiene performed , maximum sterile barriers used  and Seldinger technique used  Attempts: 1 Procedure performed without using ultrasound guided technique. Following insertion, Biopatch and dressing applied. Post procedure assessment: normal  Patient tolerated the procedure well with no immediate complications. Additional procedure comments: Performed by Kathrin Penner.

## 2018-12-07 ENCOUNTER — Encounter (HOSPITAL_COMMUNITY): Payer: Self-pay | Admitting: Vascular Surgery

## 2018-12-07 ENCOUNTER — Ambulatory Visit (HOSPITAL_COMMUNITY): Payer: PPO

## 2018-12-07 DIAGNOSIS — I739 Peripheral vascular disease, unspecified: Secondary | ICD-10-CM

## 2018-12-07 LAB — CBC
HCT: 34.3 % — ABNORMAL LOW (ref 39.0–52.0)
Hemoglobin: 11.2 g/dL — ABNORMAL LOW (ref 13.0–17.0)
MCH: 28.6 pg (ref 26.0–34.0)
MCHC: 32.7 g/dL (ref 30.0–36.0)
MCV: 87.5 fL (ref 80.0–100.0)
Platelets: 207 10*3/uL (ref 150–400)
RBC: 3.92 MIL/uL — ABNORMAL LOW (ref 4.22–5.81)
RDW: 13.7 % (ref 11.5–15.5)
WBC: 5.6 10*3/uL (ref 4.0–10.5)
nRBC: 0 % (ref 0.0–0.2)

## 2018-12-07 LAB — POCT I-STAT 7, (LYTES, BLD GAS, ICA,H+H)
Acid-base deficit: 2 mmol/L (ref 0.0–2.0)
Bicarbonate: 23.7 mmol/L (ref 20.0–28.0)
Calcium, Ion: 1.17 mmol/L (ref 1.15–1.40)
HCT: 33 % — ABNORMAL LOW (ref 39.0–52.0)
Hemoglobin: 11.2 g/dL — ABNORMAL LOW (ref 13.0–17.0)
O2 Saturation: 100 %
Potassium: 3.8 mmol/L (ref 3.5–5.1)
Sodium: 141 mmol/L (ref 135–145)
TCO2: 25 mmol/L (ref 22–32)
pCO2 arterial: 41.2 mmHg (ref 32.0–48.0)
pH, Arterial: 7.369 (ref 7.350–7.450)
pO2, Arterial: 269 mmHg — ABNORMAL HIGH (ref 83.0–108.0)

## 2018-12-07 LAB — POCT I-STAT 4, (NA,K, GLUC, HGB,HCT)
Glucose, Bld: 184 mg/dL — ABNORMAL HIGH (ref 70–99)
HCT: 35 % — ABNORMAL LOW (ref 39.0–52.0)
Hemoglobin: 11.9 g/dL — ABNORMAL LOW (ref 13.0–17.0)
Potassium: 4.4 mmol/L (ref 3.5–5.1)
Sodium: 138 mmol/L (ref 135–145)

## 2018-12-07 LAB — GLUCOSE, CAPILLARY
Glucose-Capillary: 209 mg/dL — ABNORMAL HIGH (ref 70–99)
Glucose-Capillary: 203 mg/dL — ABNORMAL HIGH (ref 70–99)
Glucose-Capillary: 211 mg/dL — ABNORMAL HIGH (ref 70–99)
Glucose-Capillary: 192 mg/dL — ABNORMAL HIGH (ref 70–99)

## 2018-12-07 LAB — BASIC METABOLIC PANEL
Anion gap: 9 (ref 5–15)
BUN: 17 mg/dL (ref 8–23)
CO2: 25 mmol/L (ref 22–32)
Calcium: 8.4 mg/dL — ABNORMAL LOW (ref 8.9–10.3)
Chloride: 104 mmol/L (ref 98–111)
Creatinine, Ser: 1.11 mg/dL (ref 0.61–1.24)
GFR calc Af Amer: >60 mL/min (ref >60)
GFR calc non Af Amer: >60 mL/min (ref >60)
Glucose, Bld: 197 mg/dL — ABNORMAL HIGH (ref 70–99)
Potassium: 4.4 mmol/L (ref 3.5–5.1)
Sodium: 138 mmol/L (ref 135–145)

## 2018-12-07 NOTE — Evaluation (Signed)
Occupational Therapy Evaluation Patient Details Name: Harold Waters MRN: [id number] DOB: 08-Feb-1946 Today's Date: 12/07/2018    History of Present Illness 73 yo male s/p  Left common femoral to above-knee popliteal artery bypass with ipsilateral reversed greater saphenous vein  PMH: Angina, Chronic stomach ulcer, Coronary atherosclerosis of unspecified type of vessel, DVT  Erosive gastritis,HTN, kidney stones, Internal hemorrhoids, Lower back pain,MI, Osteoarthritis, PAD Pneumonia (1957), PSVT  and Sleep apnea.    Clinical Impression   Patient is s/p L fem pop surgery resulting in functional limitations due to the deficits listed below (see OT problem list). Pt currently requires mod (A) for basic transfer with noted decr oxygen to 87% on RA. Pt placed on 2 L of oxygen and RN notified. Pt unable to sustain attention due to lethargic s/p IV pain medication.  Patient will benefit from skilled OT acutely to increase independence and safety with ADLS to allow discharge SNF. [name]     Follow Up Recommendations  SNF(likely to refuse)    Equipment Recommendations  Hospital bed(family wants to know cost to have one in home)    Recommendations for Other Services       Precautions / Restrictions Precautions Precautions: Other (comment)(watch 02) Restrictions Weight Bearing Restrictions: No      Mobility Bed Mobility               General bed mobility comments: up in chair on arrival  Transfers Overall transfer level: Needs assistance Equipment used: Rolling walker (2 wheeled) Transfers: Sit to/from Stand Sit to Stand: Mod assist         General transfer comment: pt requires mod cues for hand placement . pt iwth incr pain to 7 out 10 . pt with decr pain with sitting an dfalling back to sleep    Balance Overall balance assessment: Needs assistance           Standing balance-Leahy Scale: Poor Standing balance comment: reliant on RW                            ADL either performed or assessed with clinical judgement   ADL Overall ADL's : Needs assistance/impaired Eating/Feeding: Set up;Sitting Eating/Feeding Details (indicate cue type and reason): eating cereal in chair and noted spilling items Grooming: Wash/dry face;Minimal assistance;Sitting   Upper Body Bathing: Moderate assistance;Sitting   Lower Body Bathing: Maximal assistance;Sit to/from stand   Upper Body Dressing : Minimal assistance;Sitting   Lower Body Dressing: Maximal assistance;Sit to/from stand   Toilet Transfer: Moderate assistance;RW             General ADL Comments: pt in chair on arrival with no pain. pt currently lethargic with decr attention to task. pt with decr oxygen to 87%     Vision Baseline Vision/History: Wears glasses Wears Glasses: At all times       Perception     Praxis      Pertinent Vitals/Pain Pain Assessment: No/denies pain     Hand Dominance Right   Extremity/Trunk Assessment Upper Extremity Assessment Upper Extremity Assessment: Overall WFL for tasks assessed   Lower Extremity Assessment Lower Extremity Assessment: Defer to PT evaluation   Cervical / Trunk Assessment Cervical / Trunk Assessment: Normal   Communication Communication Communication: No difficulties   Cognition Arousal/Alertness: Awake/alert Behavior During Therapy: WFL for tasks assessed/performed Overall Cognitive Status: Within Functional Limits for tasks assessed  General Comments  wound dry at this time. 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 son is staying at the home to help as much as possible. son travels for work.         OT Problem List: Decreased strength;Decreased range of motion;Impaired balance (sitting and/or standing);Decreased activity tolerance;Decreased safety awareness;Decreased knowledge of use of DME or AE;Decreased knowledge of precautions;Cardiopulmonary status limiting activity;Pain      OT Treatment/Interventions: Self-care/ADL training;Therapeutic exercise;Neuromuscular education;Energy conservation;DME and/or AE instruction;Modalities;Manual therapy;Therapeutic activities;Patient/family education;Balance training    OT Goals(Current goals can be found in the care plan section) Acute Rehab OT Goals Patient Stated Goal: to go home today OT Goal Formulation: With family Time For Goal Achievement: 12/21/18 Potential to Achieve Goals: Good  OT Frequency: Min 3X/week   Barriers to D/C: Decreased caregiver support  wife unable to (A). son travels for work and staying in the home as much as possible. daughters have children and running business while patient is hospitalized       Co-evaluation              AM-PAC OT "6 Clicks" Daily Activity     Outcome Measure Help from another person eating meals?: A Little Help from another person taking care of personal grooming?: A Lot Help from another person toileting, which includes using toliet, bedpan, or urinal?: A Lot Help from another person bathing (including washing, rinsing, drying)?: A Lot Help from another person to put on and  taking off regular upper body clothing?: A Lot Help from another person to put on and taking off regular lower body clothing?: A Lot 6 Click Score: 13   End of Session Equipment Utilized During Treatment: Gait belt;Rolling walker Nurse Communication: Mobility status;Precautions  Activity Tolerance: Patient tolerated treatment well Patient left: in chair;with call bell/phone within reach;with family/visitor present  OT Visit Diagnosis: Unsteadiness on feet (R26.81);Muscle weakness (generalized) (M62.81);Pain Pain - Right/Left: Left Pain - part of body: Leg                Time: 9242-6834 OT Time Calculation (min): 22 min Charges:  OT General Charges $OT Visit: 1 Visit OT Evaluation $OT Eval Moderate Complexity: 1 Mod   Jeri Modena, OTR/L  Acute Rehabilitation Services Pager: [phone] Office: [phone] .   Jeri Modena 12/07/2018, 11:04 AM

## 2018-12-07 NOTE — Evaluation (Signed)
Physical Therapy Evaluation Patient Details Name: Harold Waters MRN: [id number] DOB: 06-Dec-1945 Today's Date: 12/07/2018   History of Present Illness  73 yo male s/p  Left common femoral to above-knee popliteal artery bypass with ipsilateral reversed greater saphenous vein   Clinical Impression  Pt was seen for evaluation with significant pain post op, noting his willingness to push ahead with this.  Talked with him about his hope to return to work quickly, as he is very appropriate to go to SNF for rehab due to pain and debility/assist needed.  Pt is with family who are supportive of his need to get therapy, and will plan accordingly for what he needs to do for safety and concern for recovery needs.  Follow acutely to increase mobility as tolerated, and to increase time OOB and standing as able, with sats, pulses and BP monitored.    Follow Up Recommendations SNF    Equipment Recommendations  Rolling walker with 5" wheels    Recommendations for Other Services       Precautions / Restrictions Precautions Precautions: Other (comment)(monitor O2 sats and pulses) Precaution Comments: premedicate Restrictions Weight Bearing Restrictions: No      Mobility  Bed Mobility Overal bed mobility: Needs Assistance Bed Mobility: Supine to Sit;Sit to Supine     Supine to sit: Mod assist Sit to supine: Min assist   General bed mobility comments: helped under trunk and legs getting up and legs back to bed   Transfers Overall transfer level: Needs assistance Equipment used: Rolling walker (2 wheeled) Transfers: Sit to/from Stand Sit to Stand: Mod assist         General transfer comment: Mod assist to stand as pain elevated from 2 bedside to 10 standing  Ambulation/Gait Ambulation/Gait assistance: Min assist;+2 physical assistance;+2 safety/equipment Gait Distance (Feet): 4 Feet Assistive device: Rolling walker (2 wheeled);1 person hand held assist Gait Pattern/deviations: Step-to  pattern;Decreased stride length;Wide base of support Gait velocity: reduced Gait velocity interpretation: <1.8 ft/sec, indicate of risk for recurrent falls General Gait Details: sidesteps side of bed then back to bed, has been nauseated today  Stairs            Wheelchair Mobility    Modified Rankin (Stroke Patients Only)       Balance Overall balance assessment: Needs assistance Sitting-balance support: Feet supported Sitting balance-Leahy Scale: Fair     Standing balance support: Bilateral upper extremity supported;During functional activity Standing balance-Leahy Scale: Poor Standing balance comment: reliant on RW                             Pertinent Vitals/Pain Pain Assessment: 0-10 Pain Score: 10-Worst pain ever Pain Location: L hip and leg Pain Descriptors / Indicators: Operative site guarding Pain Intervention(s): Limited activity within patient's tolerance;Premedicated before session;Repositioned    Home Living Family/patient expects to be discharged to:: Private residence Living Arrangements: Spouse/significant other Available Help at Discharge: Family;Available PRN/intermittently Type of Home: House Home Access: Stairs to enter Entrance Stairs-Rails: Right Entrance Stairs-Number of Steps: 2 Home Layout: Two level;Bed/bath upstairs;1/2 bath on main level Home Equipment: None Additional Comments: 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 son is staying at the home to help as much  as possible. son travels for work.      Hand Dominance   Dominant Hand: Right    Extremity/Trunk Assessment   Upper Extremity Assessment Upper Extremity Assessment: Overall WFL for tasks assessed    Lower Extremity Assessment Lower Extremity  Assessment: LLE deficits/detail LLE Deficits / Details: surgery related weakness LLE: Unable to fully assess due to pain LLE Coordination: decreased fine motor;decreased gross motor    Cervical / Trunk Assessment Cervical / Trunk Assessment: Normal  Communication   Communication: No difficulties  Cognition Arousal/Alertness: Awake/alert Behavior During Therapy: WFL for tasks assessed/performed Overall Cognitive Status: Within Functional Limits for tasks assessed                                 General Comments: lethargic once back to bed      General Comments General comments (skin integrity, edema, etc.): clean incision with edema on L thigh    Exercises     Assessment/Plan    PT Assessment Patient needs continued PT services  PT Problem List Decreased strength;Decreased range of motion;Decreased activity tolerance;Decreased balance;Decreased mobility;Decreased coordination;Decreased knowledge of use of DME;Decreased safety awareness;Cardiopulmonary status limiting activity;Decreased skin integrity;Pain       PT Treatment Interventions DME instruction;Gait training;Functional mobility training;Therapeutic activities;Therapeutic exercise;Stair training;Balance training;Neuromuscular re-education;Patient/family education    PT Goals (Current goals can be found in the Care Plan section)  Acute Rehab PT Goals Patient Stated Goal: to be back to work soon PT Goal Formulation: With patient Time For Goal Achievement: 12/21/18 Potential to Achieve Goals: Good    Frequency Min 2X/week   Barriers to discharge Inaccessible home environment;Decreased caregiver support stairs to enter house and is likely to overdo, but has no assist as wife is sick    Co-evaluation               AM-PAC PT "6 Clicks" Mobility  Outcome Measure Help needed turning from your back to your side while in a flat bed without using bedrails?: A Little Help needed moving from lying on  your back to sitting on the side of a flat bed without using bedrails?: A Lot Help needed moving to and from a bed to a chair (including a wheelchair)?: A Lot Help needed standing up from a chair using your arms (e.g., wheelchair or bedside chair)?: A Lot Help needed to walk in hospital room?: A Little Help needed climbing 3-5 steps with a railing? : Total 6 Click Score: 13    End of Session Equipment Utilized During Treatment: Gait belt Activity Tolerance: Patient tolerated treatment well;Patient limited by pain Patient left: in bed;with call bell/phone within reach;with bed alarm set;with family/visitor present Nurse Communication: Mobility status;Other (comment)(asking for soup) PT Visit Diagnosis: Unsteadiness on feet (R26.81);Pain;Difficulty in walking, not elsewhere classified (R26.2) Pain - Right/Left: Left Pain - part of body: Knee    Time: 1341-1405 PT Time Calculation (min) (ACUTE ONLY): 24 min   Charges:   PT Evaluation $PT Eval Moderate Complexity: 1 Mod PT Treatments $Gait Training: 8-22 mins       Ramond Dial 12/07/2018, 4:39 PM  Mee Hives, PT MS Acute Rehab Dept. Number: [place] and [place]

## 2018-12-07 NOTE — Progress Notes (Signed)
Inpatient Diabetes Program Recommendations  AACE/ADA: New Consensus Statement on Inpatient Glycemic Control (2015)  Target Ranges:  Prepandial:   less than 140 mg/dL      Peak postprandial:   less than 180 mg/dL (1-2 hours)      Critically ill patients:  140 - 180 mg/dL   Lab Results  Component Value Date   GLUCAP 209 (H) 12/07/2018   HGBA1C 8.8 (H) 12/06/2018    Review of Glycemic Control  Diabetes history:Type 2 Outpatient Diabetes medications: none listed Current orders for Inpatient glycemic control: Novolog SENSITIVE correction scale TID & HS  Inpatient Diabetes Program Recommendations:    Noted that patient's blood sugars have been in the 100's-200's and HgbA1C is 8.8%. Recommend continuing Novolog SENSITIVE correction scale TID & HS while in the hospital.  Patient does not have any home medications for diabetes listed. Would benefit following up with PCP at discharge and perhaps starting on Metformin and a DPP-4 such as Januvia.   Harvel Ricks RN BSN CDE Diabetes Coordinator Pager: [phone]  8am-5pm

## 2018-12-07 NOTE — Progress Notes (Signed)
Bilateral ABI/TBI completed. Please see preliminary notes on CV PROC under chart review. Harold Waters(RDMS RVT) 12/07/18 12:38 PM

## 2018-12-07 NOTE — Progress Notes (Signed)
PT Cancellation Note  Patient Details Name: [name] MRN: [id number] DOB: 10/28/1946   Cancelled Treatment:    Reason Eval/Treat Not Completed: Medical issues which prohibited therapy.  Pt had pain meds after OT and is light headed, low BP and in pain so will try again later.   Ramond Dial 12/07/2018, 11:32 AM  Mee Hives, PT MS Acute Rehab Dept. Number: [place] and [place]

## 2018-12-07 NOTE — Plan of Care (Signed)
°  Problem: Clinical Measurements: °Goal: Respiratory complications will improve °Outcome: Progressing °  °Problem: Pain Managment: °Goal: General experience of comfort will improve °Outcome: Progressing °  °Problem: Safety: °Goal: Ability to remain free from injury will improve °Outcome: Progressing °  °

## 2018-12-07 NOTE — Progress Notes (Addendum)
  Progress Note    12/07/2018 7:51 AM 1 Day Post-Op  Subjective:  C/o back pain  Afebrile HR 70's-100's NSR 656'C-127'N systolic 170% 0FV4BS  Vitals:   12/07/18 0200 12/07/18 0434  BP: 130/70 (!) 142/79  Pulse:  98  Resp:  12  Temp:    SpO2:  100%    Physical Exam: Cardiac:  regular Lungs:  Non labored Incisions:  All are clean and dry Extremities:  +palpable left DP pulse; left foot is warm with motor and sensation in tact; calf is soft and non tender   CBC    Component Value Date/Time   WBC 5.6 12/07/2018 0005   RBC 3.92 (L) 12/07/2018 0005   HGB 11.2 (L) 12/07/2018 0005   HGB 14.1 09/02/2018 1151   HCT 34.3 (L) 12/07/2018 0005   HCT 42.6 09/02/2018 1151   PLT 207 12/07/2018 0005   PLT 258 09/02/2018 1151   MCV 87.5 12/07/2018 0005   MCV 87 09/02/2018 1151   MCH 28.6 12/07/2018 0005   MCHC 32.7 12/07/2018 0005   RDW 13.7 12/07/2018 0005   RDW 14.2 09/02/2018 1151   LYMPHSABS 1.7 09/02/2018 1151   MONOABS 0.4 12/24/2014 0802   EOSABS 0.7 (H) 09/02/2018 1151   BASOSABS 0.0 09/02/2018 1151    BMET    Component Value Date/Time   NA 138 12/07/2018 0005   NA 142 09/02/2018 1151   K 4.4 12/07/2018 0005   CL 104 12/07/2018 0005   CO2 25 12/07/2018 0005   GLUCOSE 197 (H) 12/07/2018 0005   BUN 17 12/07/2018 0005   BUN 20 09/02/2018 1151   CREATININE 1.11 12/07/2018 0005   CALCIUM 8.4 (L) 12/07/2018 0005   GFRNONAA >60 12/07/2018 0005   GFRAA >60 12/07/2018 0005    INR    Component Value Date/Time   INR 0.99 11/28/2018 1109     Intake/Output Summary (Last 24 hours) at 12/07/2018 0751 Last data filed at 12/07/2018 0600 Gross per 24 hour  Intake 3894.98 ml  Output 3025 ml  Net 869.98 ml     Assessment:  73 y.o. male is s/p:  1.  Harvest left greater saphenous vein 2.  Left common femoral to above-knee popliteal artery bypass with ipsilateral reversed greater saphenous vein  1 Day Post-Op  Plan: -pt with palpable left DP pulse.  No evidence  of compartment syndrome as calf and anterior compartment are soft and non tender. -pt with some back pain-most likely from OR table and bed-will mobilize today oob and with PT. -dry gauze to left groin -DVT prophylaxis:  Sq heparin   Leontine Locket, PA-C [facility] [phone] 12/07/2018 7:51 AM  I have independently interviewed and examiend patient and agree with PA assessment and plan above.   Brandon C. Donzetta Matters, MD [facility] [place] Office: [phone] Pager: [phone]

## 2018-12-08 LAB — GLUCOSE, CAPILLARY
Glucose-Capillary: 161 mg/dL — ABNORMAL HIGH (ref 70–99)
Glucose-Capillary: 175 mg/dL — ABNORMAL HIGH (ref 70–99)
Glucose-Capillary: 158 mg/dL — ABNORMAL HIGH (ref 70–99)
Glucose-Capillary: 164 mg/dL — ABNORMAL HIGH (ref 70–99)

## 2018-12-08 NOTE — Progress Notes (Addendum)
[facility] [place]  Subjective  - Doing OK over all    Objective (!) 117/104 (!) 28 99.4 F (37.4 C) (Oral) 14 94%  Intake/Output Summary (Last 24 hours) at 12/08/2018 0748 Last data filed at 12/08/2018 1324 Gross per 24 hour  Intake 280 ml  Output 925 ml  Net -645 ml    Left LE edema compartments soft, incisions healing well. Left groin soft without hematoma Heart RRR Lungs non labored breathing  ABI Findings: +---------+------------------+-----+-------------+--------+ Right    Rt Pressure (mmHg)IndexWaveform     Comment  +---------+------------------+-----+-------------+--------+ Brachial 118                    triphasic             +---------+------------------+-----+-------------+--------+ PTA      130               1.10 triphasic             +---------+------------------+-----+-------------+--------+ DP       136               1.15 triphasic             +---------+------------------+-----+-------------+--------+ Great Toe36                0.31 nearly absent         +---------+------------------+-----+-------------+--------+  +---------+------------------+-----+-------------------+-------+ Left     Lt Pressure (mmHg)IndexWaveform           Comment +---------+------------------+-----+-------------------+-------+ Brachial 116                    triphasic                  +---------+------------------+-----+-------------------+-------+ PTA      106               0.90 biphasic                   +---------+------------------+-----+-------------------+-------+ DP       83                0.70 dampened monophasic        +---------+------------------+-----+-------------------+-------+ Great Toe63                0.53 irregular                  +---------+------------------+-----+-------------------+-------+  +-------+-----------+-----------+------------+------------+ ABI/TBIToday's  ABIToday's TBIPrevious ABIPrevious TBI +-------+-----------+-----------+------------+------------+ Right  1.15       0.31       1.12        0.92         +-------+-----------+-----------+------------+------------+ Left   0.90       0.53       0.72        0.63         +-------+-----------+-----------+------------+------------+   Assessment/Planning: POD # 1 73 y.o. male is s/p:  1.Harvest left greater saphenous vein 2.Left common femoral to above-knee popliteal artery bypass with ipsilateral reversed greater saphenous vein  1 Day Post-Op  Encouraged mobility Cont. Aspirin and Plavix Plan discharge for SNF with rolling walker for home Elevate LE when at rest   Roxy Horseman 12/08/2018 7:48 AM --  Laboratory Lab Results: Recent Labs    12/06/18 1255 12/07/18 0005  WBC 10.0 5.6  HGB 11.2* 11.2*  HCT 34.4* 34.3*  PLT 192 207   BMET Recent Labs    12/06/18 0924 12/06/18 1255 12/07/18 0005  NA  138  --  138  K 4.4  --  4.4  CL  --   --  104  CO2  --   --  25  GLUCOSE 184*  --  197*  BUN  --   --  17  CREATININE  --  1.13 1.11  CALCIUM  --   --  8.4*    COAG Lab Results  Component Value Date   INR 0.99 11/28/2018   INR 1.0 04/14/2017   INR 0.97 02/19/2012   No results found for: PTT    I have interviewed and examined patient with PA and agree with assessment and plan above.   Nahomi Hegner C. Donzetta Matters, MD [facility] [place] Office: [phone] Pager: [phone]

## 2018-12-08 NOTE — Anesthesia Postprocedure Evaluation (Signed)
Anesthesia Post Note  Patient: Harold Waters  Procedure(s) Performed: BYPASS GRAFT FEMORAL-POPLITEAL ARTERY (Left )     Patient location during evaluation: PACU Anesthesia Type: General Level of consciousness: awake and alert Pain management: pain level controlled Vital Signs Assessment: post-procedure vital signs reviewed and stable Respiratory status: spontaneous breathing, nonlabored ventilation, respiratory function stable and patient connected to nasal cannula oxygen Cardiovascular status: blood pressure returned to baseline and stable Postop Assessment: no apparent nausea or vomiting Anesthetic complications: no    Last Vitals:  Vitals:   12/07/18 1631 12/07/18 1938  BP: 124/78 (!) 117/104  Pulse:  (!) 28  Resp: 13 14  Temp: 37.2 C 37.4 C  SpO2:  94%    Last Pain:  Vitals:   12/08/18 0321  TempSrc:   PainSc: Asleep                 Catalina Gravel

## 2018-12-08 NOTE — NC FL2 (Signed)
[place] MEDICAID FL2 LEVEL OF CARE SCREENING TOOL     IDENTIFICATION  Patient Name: Harold Waters Birthdate: [birth year] Sex: male Admission Date (Current Location): 12/06/2018  [facility] and [place] Number:  Herbalist and Address:  [facility], [place] [address], [place], [place] [address]      Provider Number: [id number]  Attending Physician Name and Address:  [place], [place] Name and Phone Number:       Current Level of Care: Hospital Recommended Level of Care: [place] Prior Approval Number:    Date Approved/Denied:   PASRR Number: [id number]  Discharge Plan: SNF    Current Diagnoses: Patient Active Problem List   Diagnosis Date Noted  . Pre-operative clearance 11/25/2018  . PAD (peripheral artery disease) ([place]) 10/18/2018  . Chest pain 09/20/2018  . Claudication in peripheral vascular disease ([place]) 04/22/2017  . Pain in joint, shoulder region 06/22/2016  . Diabetes mellitus type 2 with complications ([place]) 26/94/8546  . Well adult exam 12/19/2014  . Migraine headache with aura 01/27/2013  . CAD (coronary artery disease) 10/03/2012  . Actinic keratosis 09/06/2012  . Unstable angina ([place]) 02/18/2012  . PSVT (paroxysmal supraventricular tachycardia) ([place]) 02/18/2012  . Hyperlipidemia 02/18/2012  . Pneumonia, community acquired 10/12/2011  . Atherosclerosis of native artery of extremity with intermittent claudication ([place]) 06/02/2010  . CAROTID ARTERY DISEASE 04/23/2010  . OSTEOARTHRITIS 02/27/2010  . ARTHRALGIA 02/27/2010  . LOW BACK PAIN 02/27/2010  . PHARYNGITIS, ACUTE 06/18/2009  . DYSPNEA 04/03/2009  . ACUTE BRONCHITIS 02/12/2009  . VENOUS INSUFFICIENCY 12/19/2008  . EDEMA 12/19/2008  . GERD 02/20/2008  . Abdominal pain 02/20/2008  . Neoplasm of uncertain behavior of skin 10/18/2007  . COUGH 10/18/2007  . Dyslipidemia 09/07/2007  . Essential hypertension 09/07/2007  . MYOCARDIAL  INFARCTION 09/07/2007  . CHEST PAIN, ATYPICAL, HX OF 09/07/2007  . APPENDECTOMY, HX OF 09/07/2007  . PERCUTANEOUS TRANSLUMINAL CORONARY ANGIOPLASTY, HX OF 09/07/2007  . ARTHROSCOPY, KNEE, HX OF 09/07/2007    Orientation RESPIRATION BLADDER Height & Weight     Self, Time, Situation, Place  O2(Nasal Canula 2 L) Continent Weight: 177 lb 14.6 oz (80.7 kg) Height:  5\' 5"  (165.1 cm)  BEHAVIORAL SYMPTOMS/MOOD NEUROLOGICAL BOWEL NUTRITION STATUS  (None) (None) Continent Diet(Carb modified)  AMBULATORY STATUS COMMUNICATION OF NEEDS Skin   Limited Assist Verbally Surgical wounds                       Personal Care Assistance Level of Assistance  Bathing, Feeding, Dressing Bathing Assistance: Limited assistance Feeding assistance: Limited assistance Dressing Assistance: Limited assistance     Functional Limitations Info  Sight, Hearing, Speech Sight Info: Adequate Hearing Info: Adequate Speech Info: Adequate    SPECIAL CARE FACTORS FREQUENCY  PT (By licensed PT), OT (By licensed OT)     PT Frequency: 5 x week OT Frequency: 5 x week            Contractures Contractures Info: Not present    Additional Factors Info  Code Status, Allergies Code Status Info: Full code Allergies Info: Eggs or Egg-derived Products           Current Medications (12/08/2018):  This is the current hospital active medication list Current Facility-Administered Medications  Medication Dose Route Frequency Provider Last Rate Last Dose  . 0.9 %  sodium chloride infusion   Intravenous Continuous Ulyses Amor, PA-C   Stopped at 12/07/18 0600  . acetaminophen (TYLENOL)  tablet 325-650 mg  325-650 mg Oral Q4H PRN Laurence Slate M, PA-C       Or  . acetaminophen (TYLENOL) suppository 325-650 mg  325-650 mg Rectal Q4H PRN Ulyses Amor, PA-C      . alum & mag hydroxide-simeth (MAALOX/MYLANTA) 200-200-20 MG/5ML suspension 15-30 mL  15-30 mL Oral Q2H PRN Laurence Slate M, PA-C      . aspirin EC tablet  81 mg  81 mg Oral Daily Laurence Slate M, PA-C   81 mg at 12/08/18 0831  . bisacodyl (DULCOLAX) EC tablet 5 mg  5 mg Oral Daily PRN Laurence Slate M, PA-C      . carvedilol (COREG) tablet 3.125 mg  3.125 mg Oral BID WC Laurence Slate M, PA-C   3.125 mg at 12/08/18 0831  . clopidogrel (PLAVIX) tablet 75 mg  75 mg Oral Daily Laurence Slate M, PA-C   75 mg at 12/08/18 0831  . docusate sodium (COLACE) capsule 100 mg  100 mg Oral Daily Laurence Slate M, PA-C   100 mg at 12/08/18 0830  . guaiFENesin-dextromethorphan (ROBITUSSIN DM) 100-10 MG/5ML syrup 15 mL  15 mL Oral Q4H PRN Laurence Slate M, PA-C      . heparin injection 5,000 Units  5,000 Units Subcutaneous Q8H Laurence Slate M, PA-C   5,000 Units at 12/08/18 7673  . hydrALAZINE (APRESOLINE) injection 5 mg  5 mg Intravenous Q20 Min PRN Laurence Slate M, PA-C      . HYDROmorphone (DILAUDID) injection 0.5-1 mg  0.5-1 mg Intravenous Q2H PRN Laurence Slate M, PA-C   1 mg at 12/07/18 0935  . insulin aspart (novoLOG) injection 0-9 Units  0-9 Units Subcutaneous TID WC Ulyses Amor, PA-C   2 Units at 12/08/18 1152  . isosorbide mononitrate (IMDUR) 24 hr tablet 60 mg  60 mg Oral Daily Laurence Slate M, PA-C   60 mg at 12/08/18 4193  . labetalol (NORMODYNE,TRANDATE) injection 10 mg  10 mg Intravenous Q10 min PRN Laurence Slate M, PA-C      . magnesium sulfate IVPB 2 g 50 mL  2 g Intravenous Daily PRN Laurence Slate M, PA-C      . metoprolol tartrate (LOPRESSOR) injection 2-5 mg  2-5 mg Intravenous Q2H PRN Laurence Slate M, PA-C      . nitroGLYCERIN (NITROSTAT) SL tablet 0.4 mg  0.4 mg Sublingual Q5 min PRN Laurence Slate M, PA-C      . ondansetron [facility]) injection 4 mg  4 mg Intravenous Q6H PRN Laurence Slate M, PA-C   4 mg at 12/06/18 1736  . oxyCODONE (Oxy IR/ROXICODONE) immediate release tablet 5-10 mg  5-10 mg Oral Q4H PRN Laurence Slate M, PA-C   10 mg at 12/08/18 1146  . pantoprazole (PROTONIX) EC tablet 40 mg  40 mg Oral Daily Laurence Slate M, PA-C   40 mg at  12/08/18 7902  . phenol (CHLORASEPTIC) mouth spray 1 spray  1 spray Mouth/Throat PRN Laurence Slate M, PA-C      . potassium chloride SA (K-DUR,KLOR-CON) CR tablet 20-40 mEq  20-40 mEq Oral Daily PRN Laurence Slate M, PA-C      . rosuvastatin (CRESTOR) tablet 40 mg  40 mg Oral Daily Laurence Slate M, PA-C   40 mg at 12/08/18 0831  . senna-docusate (Senokot-S) tablet 1 tablet  1 tablet Oral QHS PRN Ulyses Amor, PA-C         Discharge Medications: Please see discharge summary for a list of discharge medications.  Relevant  Imaging Results:  Relevant Lab Results:   Additional Information SS#: [id number]  Candie Chroman, LCSW

## 2018-12-08 NOTE — Clinical Social Work Note (Addendum)
Clinical Social Work Assessment  Patient Details  Name: Harold Waters MRN: [id number] Date of Birth: 1946-08-23  Date of referral:  12/08/18               Reason for consult:  Facility Placement                Permission sought to share information with:  Facility Sport and exercise psychologist, Family Supports Permission granted to share information::  Yes, Verbal Permission Granted  Name::     Rickey Primus   Agency::  SNFs  Relationship::  son  Contact Information:  [phone]  Housing/Transportation Living arrangements for the past 2 months:  Single Family Home Source of Information:  Patient, Adult Children Patient Interpreter Needed:  None Criminal Activity/Legal Involvement Pertinent to Current Situation/Hospitalization:  No - Comment as needed Significant Relationships:  Adult Children, Spouse Lives with:  Spouse Do you feel safe going back to the place where you live?  No Need for family participation in patient care:  Yes (Comment)  Care giving concerns:  CSW received consult for discharge needs. CSW spoke with patient regarding PT recommendation of SNF placement at time of discharge. Patient's son, Harold Waters, expressed the patient spouse will not be able to assist the patient as needed.     Social Worker assessment / plan:  CSW spoke with patient concerning possibility of rehab at [facility] before returning home. The patient's family expressed the patient is strong and very independent and would like to get back to his independence. The patient's family appeared to be very supportive and encourage the patient to work with PT/OT so he can improve his mobility and independence.    Employment status:  Retired Forensic scientist:  Medicare PT Recommendations:  [place] / Referral to community resources:  [place]  Patient/Family's Response to care:  Patient recognizes need for rehab before returning home and is agreeable to a SNF  placement. Patient reported preference for [facility].  CSW provided patient's son with  Medicare SNF listing and placed copy in chart.    Patient/Family's Understanding of and Emotional Response to Diagnosis, Current Treatment, and Prognosis:  Patient and family is realistic regarding therapy needs and expressed being hopeful for SNF placement. Patient's family expressed understanding of CSW role and discharge process as well as medical condition. No questions/concerns about plan or treatment at this time.  Emotional Assessment Appearance:  Appears stated age Attitude/Demeanor/Rapport:  Engaged Affect (typically observed):  Accepting, Appropriate Orientation:  Oriented to Self, Oriented to Place, Oriented to  Time, Oriented to Situation Alcohol / Substance use:  Not Applicable Psych involvement (Current and /or in the community):  No (Comment)  Discharge Needs  Concerns to be addressed:  Care Coordination Readmission within the last 30 days:  No Current discharge risk:  Dependent with Mobility Barriers to Discharge:  Continued Medical Work up   [employer], [place] 12/08/2018, 3:55 PM

## 2018-12-08 NOTE — Progress Notes (Signed)
Occupational Therapy Treatment Patient Details Name: Harold Waters MRN: [id number] DOB: 1946-03-17 Today's Date: 12/08/2018    History of present illness 73 yo male s/p  Left common femoral to above-knee popliteal artery bypass with ipsilateral reversed greater saphenous vein    OT comments  Pt resting in bed with daughter in room. Pt lying in bed agreeable to recliner; modA for bed mobility for scooting to EOB and lifting LLE for maneuvering. Pt modA +2 for transfer and able to take tiny steps from bed to recliner with BLEs due to pain and weakness and UB reliant on RW for support. Pt shaking from pain. Pt unable to perform figure 4 technique at this time requiring ModA for LB ADL. Pt set-upA for UB ADL. RN aware of red and very hot LLE, Pain medicine received after OT session O2 levels >93% on 2L O2. Pt would greatly benefit from continued OT skilled services for ADL, mobility and safety in SNF setting due to pt unable to properly care for self and decreased caregivers available. OT to follow acutely.   Follow Up Recommendations  SNF;Supervision - Intermittent    Equipment Recommendations  Hospital bed(if pt goes home- as bed is on 2nd floor)    Recommendations for Other Services      Precautions / Restrictions Precautions Precautions: Other (comment) Precaution Comments: premedicate Restrictions Weight Bearing Restrictions: No       Mobility Bed Mobility Overal bed mobility: Needs Assistance Bed Mobility: Supine to Sit     Supine to sit: Mod assist(scooting to edge of bed modA and maneuvering LLE )     General bed mobility comments: Scooting to edge of bed and unweighting LLE for movement to EOB with ModA overall- HOB elevated  Transfers Overall transfer level: Needs assistance Equipment used: Rolling walker (2 wheeled) Transfers: Sit to/from Stand Sit to Stand: Mod assist;+2 physical assistance         General transfer comment: ModA +2 for safety with transfer due  to shaking from pain    Balance Overall balance assessment: Needs assistance Sitting-balance support: Feet supported Sitting balance-Leahy Scale: Fair     Standing balance support: Bilateral upper extremity supported;During functional activity Standing balance-Leahy Scale: Poor Standing balance comment: reliant on RW                           ADL either performed or assessed with clinical judgement   ADL Overall ADL's : Needs assistance/impaired Eating/Feeding: Set up;Sitting Eating/Feeding Details (indicate cue type and reason): eating cereal in chair and noted spilling items                     Toilet Transfer: Moderate assistance;+2 for physical assistance;BSC   Toileting- Clothing Manipulation and Hygiene: Moderate assistance;+2 for physical assistance;Sit to/from stand       Functional mobility during ADLs: Moderate assistance;+2 for physical assistance;Rolling walker General ADL Comments: Pt lying in bed agreeable to recliner; modA +2 for transfer and able to take tiny steps with BLEs due to pain and weakness. Pt unable to perform figure 4 technique at this time requiring ModA for LB ADL. Pt set-upA for UB ADL. RN aware of red and very hot LLE, Pain medicine received after OT session O2 levels >93% on 2L O2.     Vision       Perception     Praxis      Cognition Arousal/Alertness: Awake/alert Behavior During Therapy: [facility] for tasks  assessed/performed Overall Cognitive Status: Within Functional Limits for tasks assessed                                 General Comments: lethargic once back to bed        Exercises     Shoulder Instructions       General Comments      Pertinent Vitals/ Pain       Pain Assessment: 0-10 Pain Score: 8  Pain Location: L hip and leg Pain Descriptors / Indicators: Operative site guarding Pain Intervention(s): Limited activity within patient's tolerance;Patient requesting pain meds-RN  notified  Home Living                                          Prior Functioning/Environment              Frequency  Min 3X/week        Progress Toward Goals  OT Goals(current goals can now be found in the care plan section)  Progress towards OT goals: Progressing toward goals  Acute Rehab OT Goals Patient Stated Goal: to be back to work soon OT Goal Formulation: With family Time For Goal Achievement: 12/21/18 Potential to Achieve Goals: Good ADL Goals Pt Will Perform Grooming: with set-up;sitting Pt Will Perform Upper Body Bathing: with set-up;sitting Pt Will Perform Lower Body Bathing: with set-up;with adaptive equipment;sit to/from stand Pt Will Transfer to Toilet: with supervision;ambulating;bedside commode Additional ADL Goal #1: pt will complete bed mobility supervision level prior to [place] Discharge plan remains appropriate    Co-evaluation                 AM-PAC OT "6 Clicks" Daily Activity     Outcome Measure   Help from another person eating meals?: None Help from another person taking care of personal grooming?: A Lot Help from another person toileting, which includes using toliet, bedpan, or urinal?: A Lot Help from another person bathing (including washing, rinsing, drying)?: A Lot Help from another person to put on and taking off regular upper body clothing?: A Lot Help from another person to put on and taking off regular lower body clothing?: A Lot 6 Click Score: 14    End of Session Equipment Utilized During Treatment: Gait belt;Rolling walker  OT Visit Diagnosis: Unsteadiness on feet (R26.81);Muscle weakness (generalized) (M62.81);Pain Pain - Right/Left: Left Pain - part of body: Leg   Activity Tolerance Patient tolerated treatment well   Patient Left in chair;with call bell/phone within reach;with family/visitor present   Nurse Communication Mobility status;Precautions        Time: 2836-6294 OT Time  Calculation (min): 24 min  Charges: OT General Charges $OT Visit: 1 Visit OT Treatments $Self Care/Home Management : 8-22 mins $Neuromuscular Re-education: 8-22 mins  Darryl Nestle) Marsa Aris OTR/L Acute Rehabilitation Services Pager: [phone] Office: [phone]    Fredda Hammed 12/08/2018, 11:56 AM

## 2018-12-09 LAB — GLUCOSE, CAPILLARY
Glucose-Capillary: 177 mg/dL — ABNORMAL HIGH (ref 70–99)
Glucose-Capillary: 177 mg/dL — ABNORMAL HIGH (ref 70–99)
Glucose-Capillary: 154 mg/dL — ABNORMAL HIGH (ref 70–99)
Glucose-Capillary: 167 mg/dL — ABNORMAL HIGH (ref 70–99)

## 2018-12-09 NOTE — Plan of Care (Signed)
  Problem: Education: Goal: Knowledge of General Education information will improve Description Including pain rating scale, medication(s)/side effects and non-pharmacologic comfort measures Outcome: Progressing   

## 2018-12-09 NOTE — Progress Notes (Signed)
  Progress Note    12/09/2018 9:01 AM 3 Days Post-Op  Subjective: No overnight issues, sitting in chair this morning  Vitals:   12/09/18 0508 12/09/18 0852  BP: 127/68 115/60  Pulse: 87 73  Resp: 12   Temp: 98.8 F (37.1 C)   SpO2: 100%     Physical Exam: Awake alert oriented Nonlabored respirations Left thigh with stable edema likely secondary to hematoma Palpable left dorsalis pedis pulse  CBC    Component Value Date/Time   WBC 5.6 12/07/2018 0005   RBC 3.92 (L) 12/07/2018 0005   HGB 11.2 (L) 12/07/2018 0005   HGB 14.1 09/02/2018 1151   HCT 34.3 (L) 12/07/2018 0005   HCT 42.6 09/02/2018 1151   PLT 207 12/07/2018 0005   PLT 258 09/02/2018 1151   MCV 87.5 12/07/2018 0005   MCV 87 09/02/2018 1151   MCH 28.6 12/07/2018 0005   MCHC 32.7 12/07/2018 0005   RDW 13.7 12/07/2018 0005   RDW 14.2 09/02/2018 1151   LYMPHSABS 1.7 09/02/2018 1151   MONOABS 0.4 12/24/2014 0802   EOSABS 0.7 (H) 09/02/2018 1151   BASOSABS 0.0 09/02/2018 1151    BMET    Component Value Date/Time   NA 138 12/07/2018 0005   NA 142 09/02/2018 1151   K 4.4 12/07/2018 0005   CL 104 12/07/2018 0005   CO2 25 12/07/2018 0005   GLUCOSE 197 (H) 12/07/2018 0005   BUN 17 12/07/2018 0005   BUN 20 09/02/2018 1151   CREATININE 1.11 12/07/2018 0005   CALCIUM 8.4 (L) 12/07/2018 0005   GFRNONAA >60 12/07/2018 0005   GFRAA >60 12/07/2018 0005    INR    Component Value Date/Time   INR 0.99 11/28/2018 1109     Intake/Output Summary (Last 24 hours) at 12/09/2018 0901 Last data filed at 12/09/2018 0625 Gross per 24 hour  Intake 740 ml  Output 1635 ml  Net -895 ml     Assessment:  73 y.o. male is s/p 1.Harvest left greater saphenous vein 2.Left common femoral to above-knee popliteal artery bypass with ipsilateral reversed greater saphenous vein 3 Days Post-Op  Plan: SNF on discharge Aspirin Plavix Continue physical therapy Subcutaneous heparin for DVT prophylaxis   Brandon C. Donzetta Matters,  MD [facility] [place] Office: [phone] Pager: [phone]  12/09/2018 9:01 AM

## 2018-12-09 NOTE — Progress Notes (Signed)
Patient was accepted by [facility]- they do not admit over the weekend.  CSW called updated the family.  [employer] will start auth today.  Thurmond Butts, [place] Social Worker [phone]

## 2018-12-09 NOTE — Care Management Important Message (Signed)
Important Message  Patient Details  Name: Harold Waters MRN: [id number] Date of Birth: [birth year]   Medicare Important Message Given:  Yes    [name] P [place] 12/09/2018, 1:41 PM

## 2018-12-09 NOTE — Progress Notes (Signed)
PT Cancellation Note  Patient Details Name: Harold Waters MRN: [id number] DOB: [birth year]   Cancelled Treatment:    Reason Eval/Treat Not Completed: Fatigue/lethargy limiting ability to participate.  Has declined therapy as he took one walk to BR earlier this AM around 10 and is still tired, unwilling to attempt walk.  Asking for PT to see if time can permit tomorrow to make a walking trip as tolerated.   Ramond Dial 12/09/2018, 1:14 PM   Mee Hives, PT MS Acute Rehab Dept. Number: [place] and [place]

## 2018-12-10 LAB — GLUCOSE, CAPILLARY
Glucose-Capillary: 151 mg/dL — ABNORMAL HIGH (ref 70–99)
Glucose-Capillary: 156 mg/dL — ABNORMAL HIGH (ref 70–99)
Glucose-Capillary: 132 mg/dL — ABNORMAL HIGH (ref 70–99)
Glucose-Capillary: 205 mg/dL — ABNORMAL HIGH (ref 70–99)

## 2018-12-10 NOTE — Progress Notes (Addendum)
  Progress Note    12/10/2018 9:09 AM 4 Days Post-Op  Subjective:  No new complaints   Vitals:   12/10/18 0444 12/10/18 0754  BP: 122/63 115/61  Pulse: 84 81  Resp: 16 20  Temp: 98.5 F (36.9 C)   SpO2: 90% 95%   Physical Exam: Lungs:  Non labored Incisions: some drainage from vein harvest incisions Extremities:  Palpable DP pulses bilaterally Abdomen:  Soft Neurologic: A&O  CBC    Component Value Date/Time   WBC 5.6 12/07/2018 0005   RBC 3.92 (L) 12/07/2018 0005   HGB 11.2 (L) 12/07/2018 0005   HGB 14.1 09/02/2018 1151   HCT 34.3 (L) 12/07/2018 0005   HCT 42.6 09/02/2018 1151   PLT 207 12/07/2018 0005   PLT 258 09/02/2018 1151   MCV 87.5 12/07/2018 0005   MCV 87 09/02/2018 1151   MCH 28.6 12/07/2018 0005   MCHC 32.7 12/07/2018 0005   RDW 13.7 12/07/2018 0005   RDW 14.2 09/02/2018 1151   LYMPHSABS 1.7 09/02/2018 1151   MONOABS 0.4 12/24/2014 0802   EOSABS 0.7 (H) 09/02/2018 1151   BASOSABS 0.0 09/02/2018 1151    BMET    Component Value Date/Time   NA 138 12/07/2018 0005   NA 142 09/02/2018 1151   K 4.4 12/07/2018 0005   CL 104 12/07/2018 0005   CO2 25 12/07/2018 0005   GLUCOSE 197 (H) 12/07/2018 0005   BUN 17 12/07/2018 0005   BUN 20 09/02/2018 1151   CREATININE 1.11 12/07/2018 0005   CALCIUM 8.4 (L) 12/07/2018 0005   GFRNONAA >60 12/07/2018 0005   GFRAA >60 12/07/2018 0005    INR    Component Value Date/Time   INR 0.99 11/28/2018 1109     Intake/Output Summary (Last 24 hours) at 12/10/2018 0909 Last data filed at 12/10/2018 0748 Gross per 24 hour  Intake 560 ml  Output 1500 ml  Net -940 ml     Assessment/Plan:  73 y.o. male is s/p L femoral to popliteal bypass with vein 4 Days Post-Op   DP pulses bilaterally Aspirin and plavix daily CSW for SNF placement D/c to SNF Monday   Dagoberto Ligas, PA-C [facility] [phone] 12/10/2018 9:09 AM  I have seen and evaluated the patient. I agree with the PA note as  documented above. L CFA to AK pop bypass.  Palpable DP.  Accepted to SNF but do not accept transfer on weekends.  Overall doing well.  Feels thigh is stable.  Marty Heck, MD [facility] [place] Office: [phone] Pager: [phone]

## 2018-12-11 LAB — GLUCOSE, CAPILLARY
Glucose-Capillary: 159 mg/dL — ABNORMAL HIGH (ref 70–99)
Glucose-Capillary: 181 mg/dL — ABNORMAL HIGH (ref 70–99)
Glucose-Capillary: 184 mg/dL — ABNORMAL HIGH (ref 70–99)
Glucose-Capillary: 146 mg/dL — ABNORMAL HIGH (ref 70–99)

## 2018-12-11 NOTE — Progress Notes (Addendum)
  Progress Note    12/11/2018 7:32 AM 5 Days Post-Op  Subjective:  No new complaints   Vitals:   12/10/18 2115 12/11/18 0500  BP: 123/71 124/69  Pulse:  72  Resp: 20 20  Temp: 98.3 F (36.8 C) 98.4 F (36.9 C)  SpO2: 93% 94%   Physical Exam: Lungs:  Non labored Incisions:  Incisions of L thigh with minimal collection on dressing, no active drainage; L groin incision c/d/i Extremities:  Palpable DP pulses BLE Abdomen:  Soft Neurologic: A&O  CBC    Component Value Date/Time   WBC 5.6 12/07/2018 0005   RBC 3.92 (L) 12/07/2018 0005   HGB 11.2 (L) 12/07/2018 0005   HGB 14.1 09/02/2018 1151   HCT 34.3 (L) 12/07/2018 0005   HCT 42.6 09/02/2018 1151   PLT 207 12/07/2018 0005   PLT 258 09/02/2018 1151   MCV 87.5 12/07/2018 0005   MCV 87 09/02/2018 1151   MCH 28.6 12/07/2018 0005   MCHC 32.7 12/07/2018 0005   RDW 13.7 12/07/2018 0005   RDW 14.2 09/02/2018 1151   LYMPHSABS 1.7 09/02/2018 1151   MONOABS 0.4 12/24/2014 0802   EOSABS 0.7 (H) 09/02/2018 1151   BASOSABS 0.0 09/02/2018 1151    BMET    Component Value Date/Time   NA 138 12/07/2018 0005   NA 142 09/02/2018 1151   K 4.4 12/07/2018 0005   CL 104 12/07/2018 0005   CO2 25 12/07/2018 0005   GLUCOSE 197 (H) 12/07/2018 0005   BUN 17 12/07/2018 0005   BUN 20 09/02/2018 1151   CREATININE 1.11 12/07/2018 0005   CALCIUM 8.4 (L) 12/07/2018 0005   GFRNONAA >60 12/07/2018 0005   GFRAA >60 12/07/2018 0005    INR    Component Value Date/Time   INR 0.99 11/28/2018 1109     Intake/Output Summary (Last 24 hours) at 12/11/2018 0732 Last data filed at 12/11/2018 0500 Gross per 24 hour  Intake 920 ml  Output 2005 ml  Net -1085 ml     Assessment/Plan:  73 y.o. male is s/p L fem-pop with vein 5 Days Post-Op   Palpable DP pulses Continue aspirin and plavix Incisions open to air; dry dressing if needed Likely d/c to SNF tomorrow   Dagoberto Ligas, PA-C [facility] [phone] 12/11/2018 7:32 AM  I have seen and evaluated the patient. I agree with the PA note as documented above. Left DP palpable.  Incisions c/d/i.  Thigh stable.  Rehab recommended SNF and likely discharge to SNF tomorrow.  Marty Heck, MD [facility] [place] Office: [phone] Pager: [phone]

## 2018-12-11 NOTE — Progress Notes (Signed)
Patient ambulated about 182ft  twice in the hall on this shift, ambulation well tolerated.

## 2018-12-12 DIAGNOSIS — M6281 Muscle weakness (generalized): Secondary | ICD-10-CM | POA: Insufficient documentation

## 2018-12-12 DIAGNOSIS — K5790 Diverticulosis of intestine, part unspecified, without perforation or abscess without bleeding: Secondary | ICD-10-CM

## 2018-12-12 DIAGNOSIS — M1990 Unspecified osteoarthritis, unspecified site: Secondary | ICD-10-CM | POA: Diagnosis not present

## 2018-12-12 DIAGNOSIS — I70212 Atherosclerosis of native arteries of extremities with intermittent claudication, left leg: Secondary | ICD-10-CM | POA: Diagnosis not present

## 2018-12-12 DIAGNOSIS — I70209 Unspecified atherosclerosis of native arteries of extremities, unspecified extremity: Secondary | ICD-10-CM | POA: Diagnosis not present

## 2018-12-12 DIAGNOSIS — E538 Deficiency of other specified B group vitamins: Secondary | ICD-10-CM | POA: Insufficient documentation

## 2018-12-12 DIAGNOSIS — Z86718 Personal history of other venous thrombosis and embolism: Secondary | ICD-10-CM | POA: Diagnosis not present

## 2018-12-12 DIAGNOSIS — Z48812 Encounter for surgical aftercare following surgery on the circulatory system: Secondary | ICD-10-CM | POA: Diagnosis not present

## 2018-12-12 DIAGNOSIS — I2510 Atherosclerotic heart disease of native coronary artery without angina pectoris: Secondary | ICD-10-CM | POA: Diagnosis not present

## 2018-12-12 DIAGNOSIS — I10 Essential (primary) hypertension: Secondary | ICD-10-CM | POA: Diagnosis not present

## 2018-12-12 DIAGNOSIS — R739 Hyperglycemia, unspecified: Secondary | ICD-10-CM | POA: Insufficient documentation

## 2018-12-12 DIAGNOSIS — R531 Weakness: Secondary | ICD-10-CM | POA: Diagnosis not present

## 2018-12-12 DIAGNOSIS — R262 Difficulty in walking, not elsewhere classified: Secondary | ICD-10-CM | POA: Diagnosis not present

## 2018-12-12 DIAGNOSIS — E559 Vitamin D deficiency, unspecified: Secondary | ICD-10-CM | POA: Insufficient documentation

## 2018-12-12 DIAGNOSIS — K648 Other hemorrhoids: Secondary | ICD-10-CM | POA: Insufficient documentation

## 2018-12-12 DIAGNOSIS — E1151 Type 2 diabetes mellitus with diabetic peripheral angiopathy without gangrene: Secondary | ICD-10-CM | POA: Diagnosis not present

## 2018-12-12 DIAGNOSIS — I739 Peripheral vascular disease, unspecified: Secondary | ICD-10-CM | POA: Diagnosis not present

## 2018-12-12 DIAGNOSIS — Z7982 Long term (current) use of aspirin: Secondary | ICD-10-CM | POA: Insufficient documentation

## 2018-12-12 DIAGNOSIS — E1165 Type 2 diabetes mellitus with hyperglycemia: Secondary | ICD-10-CM | POA: Diagnosis not present

## 2018-12-12 HISTORY — DX: Diverticulosis of intestine, part unspecified, without perforation or abscess without bleeding: K57.90

## 2018-12-12 HISTORY — DX: Vitamin D deficiency, unspecified: E55.9

## 2018-12-12 HISTORY — DX: Muscle weakness (generalized): M62.81

## 2018-12-12 HISTORY — DX: Deficiency of other specified B group vitamins: E53.8

## 2018-12-12 LAB — GLUCOSE, CAPILLARY
Glucose-Capillary: 160 mg/dL — ABNORMAL HIGH (ref 70–99)
Glucose-Capillary: 240 mg/dL — ABNORMAL HIGH (ref 70–99)

## 2018-12-12 MED ORDER — OXYCODONE HCL 5 MG PO TABS: 5.0000 mg | ORAL_TABLET | Freq: Four times a day (QID) | ORAL | 0 refills | Status: DC | PRN

## 2018-12-12 NOTE — Progress Notes (Addendum)
  Progress Note    12/12/2018 7:16 AM 6 Days Post-Op  Subjective:  Patient still prefers to go to SNF.   Vitals:   12/12/18 0541 12/12/18 0618  BP:  131/67  Pulse:  73  Resp: 19 16  Temp:  98.2 F (36.8 C)  SpO2:  99%   Physical Exam: Lungs:  Non labored Incisions:  LLE incisions unremarkable, no further drainage; generalized edema LLE Extremities:  Palpable DP pulses bilaterally Abdomen:  soft Neurologic: A&O  CBC    Component Value Date/Time   WBC 5.6 12/07/2018 0005   RBC 3.92 (L) 12/07/2018 0005   HGB 11.2 (L) 12/07/2018 0005   HGB 14.1 09/02/2018 1151   HCT 34.3 (L) 12/07/2018 0005   HCT 42.6 09/02/2018 1151   PLT 207 12/07/2018 0005   PLT 258 09/02/2018 1151   MCV 87.5 12/07/2018 0005   MCV 87 09/02/2018 1151   MCH 28.6 12/07/2018 0005   MCHC 32.7 12/07/2018 0005   RDW 13.7 12/07/2018 0005   RDW 14.2 09/02/2018 1151   LYMPHSABS 1.7 09/02/2018 1151   MONOABS 0.4 12/24/2014 0802   EOSABS 0.7 (H) 09/02/2018 1151   BASOSABS 0.0 09/02/2018 1151    BMET    Component Value Date/Time   NA 138 12/07/2018 0005   NA 142 09/02/2018 1151   K 4.4 12/07/2018 0005   CL 104 12/07/2018 0005   CO2 25 12/07/2018 0005   GLUCOSE 197 (H) 12/07/2018 0005   BUN 17 12/07/2018 0005   BUN 20 09/02/2018 1151   CREATININE 1.11 12/07/2018 0005   CALCIUM 8.4 (L) 12/07/2018 0005   GFRNONAA >60 12/07/2018 0005   GFRAA >60 12/07/2018 0005    INR    Component Value Date/Time   INR 0.99 11/28/2018 1109     Intake/Output Summary (Last 24 hours) at 12/12/2018 0716 Last data filed at 12/12/2018 7858 Gross per 24 hour  Intake 720 ml  Output 1300 ml  Net -580 ml     Assessment/Plan:  73 y.o. male is s/p  L fem-pop with vein  6 Days Post-Op   Palpable DP pulse bilaterally Aspirin and Plavix Encouraged elevation of LLE when in bed D/c to SNF today Follow up in office with Dr. Donzetta Matters in 2 weeks   Dagoberto Ligas, PA-C [facility] [phone] 12/12/2018 7:16 AM  I have independently interviewed and examined patient and agree with PA assessment and plan above.  Strongly palpable left anterior tibial and dorsalis pedis pulse.  Plan for DC to SNF today and will follow-up for wound checks in a few weeks.   C. Donzetta Matters, MD [facility] [place] Office: [phone] Pager: [phone]

## 2018-12-12 NOTE — Clinical Social Work Placement (Signed)
   CLINICAL SOCIAL WORK PLACEMENT  NOTE  Date:  12/12/2018  Patient Details  Name: Harold Waters MRN: [id number] Date of Birth: May 26, 1946  Clinical Social Work is seeking post-discharge placement for this patient at the [place] level of care (*CSW will initial, date and re-position this form in  chart as items are completed):  Yes   Patient/family provided with [place] Work Department's list of facilities offering this level of care within the geographic area requested by the patient (or if unable, by the patient's family).  Yes   Patient/family informed of their freedom to choose among providers that offer the needed level of care, that participate in Medicare, Medicaid or managed care program needed by the patient, have an available bed and are willing to accept the patient.  Yes   Patient/family informed of [place]'s ownership interest in [facility] and [facility], as well as of the fact that they are under no obligation to receive care at these facilities.  PASRR submitted to EDS on       PASRR number received on 12/08/18     Existing PASRR number confirmed on       FL2 transmitted to all facilities in geographic area requested by pt/family on       FL2 transmitted to all facilities within larger geographic area on       Patient informed that his/her managed care company has contracts with or will negotiate with certain facilities, including the following:            Patient/family informed of bed offers received.  Patient chooses bed at [facility]     Physician recommends and patient chooses bed at      Patient to be transferred to   on 12/12/18.  Patient to be transferred to facility by [place] family      Patient family notified on 12/12/18 of transfer.  Name of family member notified:  Harold Waters, son     PHYSICIAN       Additional Comment:     _______________________________________________ Vinie Sill, [place] 12/12/2018, 9:58 AM

## 2018-12-12 NOTE — Consult Note (Signed)
   [facility] CM Inpatient Consult   12/12/2018  BARTON WANT 29-Oct-1946 [id number]    Patient screened for potential [facility] Care Management needs prior to hospital discharge.   Spoke with inpatient RNCM who confirms disposition plan is for [facility]. No identifiable [facility] Care Management needs.    Marthenia Rolling, MSN-Ed, RN,BSN [facility] Liaison [phone]

## 2018-12-12 NOTE — Progress Notes (Signed)
Patient will DC to: [facility] at [place] Date: 12/12/2018 Family Notified: Noah Delaine, son Transport By: Musician- his son will transport the patient.  RN, patient, and facility notified of DC. Discharge Summary sent to facility. RN given number for [phone]. DC packet on chart.    Clinical Social Worker signing off. Thurmond Butts, [place] Social Worker [phone]

## 2018-12-12 NOTE — Progress Notes (Signed)
Patient in a stable condition , discharge education reviewed with patient and his son at bedside, they verbalized understanding, iv removed, tele dc, ccmd notified, patient belongings at bedside, report called to nurse Lattie Haw at [facility], patient to be transported to [facility] [facility] by his son.

## 2018-12-12 NOTE — Discharge Summary (Signed)
Physician Discharge Summary   Patient ID: Harold Waters [id number] 73 y.o. 05/27/1946  Admit date: 12/06/2018  Discharge date and time: 12/12/18   Admitting Physician: Waynetta Sandy, MD   Discharge Physician: same  Admission Diagnoses: ATHEROSCLEROSIS OF NATIVE ARTERY OF BOTH LOWER EXTREMITIES WITH INTERMITTENT CLAUDICATION  Discharge Diagnoses: same  Admission Condition: poor  Discharged Condition: fair  Indication for Admission: life style limiting left lower extremity claudication  Hospital Course: Mr. Harold Waters is a 73 year old male who was brought in as an outpatient for left femoral to above-the-knee popliteal artery bypass with vein by Dr. Donzetta Matters on 12/06/2018 due to lifestyle limiting left leg claudication.  He tolerated the procedure well and was admitted to the hospital postoperatively.  Much of his hospital stay involved increasing mobility and pain control.  Physical therapy and Occupational Therapy evaluated the patient and recommended placement in skilled nursing facility.  Clinical social worker was consulted for SNF placement.  He was accepted to [facility].  He will also be prescribed a rolling walker.  He maintained a palpable left DP pulse throughout his hospital stay.  He will follow-up with Dr. Donzetta Matters in about 2 weeks.  Discharge instructions were reviewed with the patient.  He will be discharged this afternoon to [facility] in stable condition.  Consults: None  Treatments: surgery: L femoral to AK popliteal artery bypass with vein by Dr. Donzetta Matters 12/06/18  Discharge Exam: see progress note 12/12/18 Vitals:   12/12/18 0541 12/12/18 0618  BP:  131/67  Pulse:  73  Resp: 19 16  Temp:  98.2 F (36.8 C)  SpO2:  99%    Disposition: Discharge disposition: 03-Skilled [place] Registry use ---  Post-op:  Wound infection: No  Graft infection: No  New Arrhythmia: No Ipsilateral amputation: [x ] no, [ ]  Minor, [ ]  BKA,  [ ]  AKA Patency judged by: [ ]  Dopper only, [ ]  Palpable graft pulse, [x ] Palpable distal pulse, [ ]  ABI inc. > 0.15, [ ]  Duplex D/C Ambulatory Status: Ambulatory with Assistance  Complications: MI: [ x] No, [ ]  Troponin only, [ ]  EKG or Clinical CHF: No Resp failure: [x ] none, [ ]  Pneumonia, [ ]  Ventilator Chg in renal function: [x ] none, [ ]  Inc. Cr > 0.5, [ ]  Temp. Dialysis, [ ]  Permanent dialysis Stroke: [x ] None, [ ]  Minor, [ ]  Major Return to OR: No  Reason for return to OR: [ ]  Bleeding, [ ]  Infection, [ ]  Thrombosis, [ ]  Revision  Discharge medications: Statin use:  Yes ASA use:  Yes Plavix use:  Yes Beta blocker use: Yes Coumadin use: No  for medical reason not indicated    Patient Instructions:  Allergies as of 12/12/2018      Reactions   Eggs Or Egg-derived Products Itching      Medication List    TAKE these medications   aspirin EC 81 MG tablet Take 81 mg by mouth daily.   carvedilol 3.125 MG tablet Commonly known as:  COREG Take 3.125 mg by mouth 2 (two) times daily with a meal.   clopidogrel 75 MG tablet Commonly known as:  PLAVIX Take 75 mg by mouth daily.   isosorbide mononitrate 30 MG 24 hr tablet Commonly known as:  IMDUR Take 60 mg by mouth daily.   isosorbide mononitrate 60 MG 24 hr tablet Commonly known as:  IMDUR Take 1 tablet (60 mg  total) by mouth daily.   MENS MULTIVITAMIN PLUS Tabs Take 1 tablet by mouth at bedtime.   nitroGLYCERIN 0.4 MG SL tablet Commonly known as:  NITROSTAT Place 1 tablet under tongue every 5 mins. DO NOT USE MORE THAN 3 TABS   oxyCODONE 5 MG immediate release tablet Commonly known as:  Oxy IR/ROXICODONE Take 1 tablet (5 mg total) by mouth every 6 (six) hours as needed for moderate pain.   rosuvastatin 40 MG tablet Commonly known as:  CRESTOR TAKE 1 TABLET BY MOUTH DAILY. PLEASE MAKE OVERDUE YEARLY APPT WITH DR. [name] BEFORE ANYMORE REFILLS What changed:  See the new instructions.      Activity:  activity as tolerated Diet: regular diet Wound Care: keep wound clean and dry  Follow-up with Dr. Donzetta Matters in 2 weeks.  SignedDagoberto Ligas 12/12/2018 7:21 AM

## 2018-12-13 ENCOUNTER — Telehealth: Payer: Self-pay | Admitting: *Deleted

## 2018-12-13 DIAGNOSIS — I10 Essential (primary) hypertension: Secondary | ICD-10-CM | POA: Diagnosis not present

## 2018-12-13 DIAGNOSIS — R739 Hyperglycemia, unspecified: Secondary | ICD-10-CM | POA: Diagnosis not present

## 2018-12-13 DIAGNOSIS — I2510 Atherosclerotic heart disease of native coronary artery without angina pectoris: Secondary | ICD-10-CM | POA: Diagnosis not present

## 2018-12-13 DIAGNOSIS — R531 Weakness: Secondary | ICD-10-CM | POA: Diagnosis not present

## 2018-12-13 DIAGNOSIS — I739 Peripheral vascular disease, unspecified: Secondary | ICD-10-CM | POA: Diagnosis not present

## 2018-12-13 NOTE — Telephone Encounter (Signed)
Pt was on TCM report admitted 12/06/18 for postoperativel for left femoral to above-the-knee popliteal artery bypass with vein. Much of his hospital stay involved increasing mobility and pain control. Physical therapy and Occupational Therapy evaluated the patient and recommended placement in skilled nursing facility. Pt D/[facility] 12[facility]12/[facility]. Pt will follow-up with Dr. Donzetta Matters in about 2 weeks.Marland KitchenJohny Chess

## 2018-12-14 ENCOUNTER — Telehealth: Payer: Self-pay

## 2018-12-14 NOTE — Telephone Encounter (Signed)
RN from SNF called to let us know patient was having swelling in his legs especially the operative side. Instructed her to make sure when the patient was lying or sitting to elevate legs above the heart. Call as needed.

## 2018-12-16 ENCOUNTER — Other Ambulatory Visit (HOSPITAL_COMMUNITY): Payer: Self-pay | Admitting: Cardiology

## 2018-12-16 DIAGNOSIS — E1165 Type 2 diabetes mellitus with hyperglycemia: Secondary | ICD-10-CM | POA: Diagnosis not present

## 2018-12-20 ENCOUNTER — Telehealth: Payer: Self-pay | Admitting: Internal Medicine

## 2018-12-20 DIAGNOSIS — I739 Peripheral vascular disease, unspecified: Secondary | ICD-10-CM | POA: Diagnosis not present

## 2018-12-20 DIAGNOSIS — I10 Essential (primary) hypertension: Secondary | ICD-10-CM | POA: Diagnosis not present

## 2018-12-20 DIAGNOSIS — E1151 Type 2 diabetes mellitus with diabetic peripheral angiopathy without gangrene: Secondary | ICD-10-CM | POA: Diagnosis not present

## 2018-12-20 DIAGNOSIS — I70209 Unspecified atherosclerosis of native arteries of extremities, unspecified extremity: Secondary | ICD-10-CM | POA: Diagnosis not present

## 2018-12-20 DIAGNOSIS — R531 Weakness: Secondary | ICD-10-CM | POA: Diagnosis not present

## 2018-12-20 NOTE — Telephone Encounter (Signed)
Copied from [place] [id number]. Topic: General - Other >> Dec 20, 2018  2:44 PM Carolyn Stare wrote:  While pt was in rehab they diagn him with having diabetes and was put on metformin and told him to follow up with his pcp to make sure he is o nthe right dose. Daughter [name] would like an appt asap cause his sugar want go down can leave a message

## 2018-12-21 NOTE — Telephone Encounter (Signed)
Please call to schedule appt. Thank you!

## 2018-12-22 NOTE — Telephone Encounter (Signed)
appt made

## 2018-12-23 ENCOUNTER — Ambulatory Visit (INDEPENDENT_AMBULATORY_CARE_PROVIDER_SITE_OTHER): Payer: Self-pay | Admitting: Vascular Surgery

## 2018-12-23 ENCOUNTER — Other Ambulatory Visit: Payer: Self-pay | Admitting: *Deleted

## 2018-12-23 ENCOUNTER — Encounter: Payer: Self-pay | Admitting: Vascular Surgery

## 2018-12-23 ENCOUNTER — Other Ambulatory Visit: Payer: Self-pay

## 2018-12-23 VITALS — BP 122/68 | HR 75 | Temp 97.2°F | Ht 65.0 in | Wt 175.0 lb

## 2018-12-23 DIAGNOSIS — R739 Hyperglycemia, unspecified: Secondary | ICD-10-CM

## 2018-12-23 DIAGNOSIS — I70213 Atherosclerosis of native arteries of extremities with intermittent claudication, bilateral legs: Secondary | ICD-10-CM

## 2018-12-23 MED ORDER — METFORMIN HCL 500 MG PO TABS: 500.0000 mg | ORAL_TABLET | Freq: Two times a day (BID) | ORAL | 2 refills | Status: DC

## 2018-12-23 NOTE — Patient Outreach (Signed)
[facility]) Care Management  12/23/2018  Harold Waters 1946-02-06 [id number]   Transition of Care Referral   Referral Date:  12/22/2018 Referral Source: HTA Urgent Outreach for [facility] Date of Admission:  Diagnosis: Athscl native arteries of extrm w intrmt claud, bi legs Date of Discharge: Facility: [facility] on 12/20/18. Insurance: HTA   Outreach attempt # 1 successful at his home number  Patient is able to verify HIPAA Reviewed and addressed Transitional of care referral with patient  Transition of care assessment completed  Recently  dx with Diabetes  He did not know his A1c value which was 8.8 on 12/06/2018 He also confirms he does not have a cbg meter to check his cbg but recalls when it was checked last at the facility it was in 52 s    Harold Waters reports noting some memory issues since his last hospitalization  He reports losing hs wallet x 3 today He confirms he has not been seen by neurology related to his memory   Social: Harold Waters lives with his wife, [name] who also is diabetic He reports learning about the diabetic diet with her.  He has support of his daughter [name] and son, [name]  He is independent/assist with care and iADLs. He denies issues with transportation to medical appointments    Conditions: hx of PVD and left LE claudication, elective Fem-Pop bypass on 12/06/18, constipation, hx of MI, unstable angina hx, hx of PNA,  Neoplasm of skin, DM type 2, GERD, HTN, HDL, CAD, PSVT,  Venous insufficiency, PAD, migraine, osteoarthritis, pain in right shoulder, hx of appendectomy     Medications: He denies issues with the cost of his medications or taking them as ordered but has noted some s/e of constipation from one of his 6 pills - oxycodone was listed in Epic He reports he has been taking oxycodone but is no longer taking it nor the NTG  CM discussed constipation related to medications, decrease fluid and vegetable intake He agreed  to [facility] education related to constipation Cm encouraged more fruits, vegetable, activity and possibly and OTC medicine like colace He voiced understanding and appreciation    Appointments: He reports having an appointment today with Dr Donzetta Matters Vascular MD but is having issues with getting an appointment at his primary MD until 01/30/2019  Cm discussed the office cancellation list with him and he agrees to have CM contact staff at his primary MD office next week to see if he could be placed on the office appointment cancellation list to possibly be seen earlier  Advance directives: Denies need for assist with advance directives   Consent: [facility] RN CM reviewed [facility] services with patient. Patient gave verbal consent for services. Advised patient that other post discharge calls may occur to assess how the patient is doing following the recent hospitalization. Patient voiced understanding and was appreciative of f/u call.  Plan: [facility] RN CM will follow back up with Harold Waters within 7 -10 business days related to his MD appointment, constipation and receipt of EMMI materials   Harold Waters was sent EMMI materials via his verified e-mail address as listed in Epic for constipation in adults, high fiber diet, hemoglobin A1c tests, diabetes: nutrition and healthy eating, evaluating memory ans thinking problems, and how to help your memory    CM provided Harold Waters with [facility] RN CM number and also the 24 hour nurse call center number   Pt encouraged to return a call to  [facility] RN CM prn  [facility] RN CM sent a successful outreach letter as discussed with [facility] brochure enclosed for review  Harold Mannor L. Lavina Hamman, RN, BSN, [place] [facility] Care Coordinator Direct Number [phone] Mobile number [phone]  Main [facility] number [phone] Fax number [phone]

## 2018-12-23 NOTE — Progress Notes (Signed)
    Subjective:     Patient ID: Harold Waters, male   DOB: 12/26/1945, 73 y.o.   MRN: [id number]  HPI 73 year old male follows up after left femoral to above-knee popliteal artery bypass.  This was done for claudication.  While in the hospital he was found to be diabetic with A1c 8.8.  He is started on metformin.  He is now home from SNF.  He is walking.  Swelling improves with elevating his leg.   Review of Systems Left leg edema    Objective:   Physical Exam Awake alert oriented 2+ pitting edema entire left lower extremity Incisions clean dry intact Palpable anterior tibial pulse    Assessment/plan     73 year old male status post left femoropopliteal bypass for claudication.  He has swelling that improves with elevation suggesting this is postoperative.  He will continue aspirin.  Follow-up in 3 months with duplex and ABIs.        Chelsie Burel C. Donzetta Matters, MD [facility] Office: [phone] Pager: [phone]

## 2018-12-26 ENCOUNTER — Telehealth: Payer: Self-pay | Admitting: Internal Medicine

## 2018-12-26 ENCOUNTER — Ambulatory Visit: Payer: Self-pay | Admitting: *Deleted

## 2018-12-26 ENCOUNTER — Other Ambulatory Visit: Payer: Self-pay | Admitting: *Deleted

## 2018-12-26 NOTE — Telephone Encounter (Signed)
Patient is scheduled to be seen on Wednesday.

## 2018-12-26 NOTE — Telephone Encounter (Signed)
Will route to office for final disposition; also see note per [facility] care Management dated 12/26/2018 requesting glucose meter and earlier appointment for pt; pt last seen by Dr Alain Marion. LB Elam 06/22/16 and is to be seen 01/30/2019 ; spoke with Gareth Eagle and she will contact the pt's daughter to scheduele an earlier office visit, Gareth Eagle will handle request for glucose meter; will route to office for notification.

## 2018-12-26 NOTE — Telephone Encounter (Signed)
Copied from [place] [id number]. Topic: Quick Communication - Rx Refill/Question >> Dec 26, 2018 11:27 AM Burchel, Abbi R wrote: Medication: Glucometer   Pt is newly diagnosed and soes not have glucose testing supplies at home.  [facility] called to request these for pt.   Preferred Pharmacy:   Agent: Please be advised that RX refills may take up to 3 business days. We ask that you follow-up with your pharmacy.

## 2018-12-26 NOTE — Patient Outreach (Signed)
[facility]) Care Management  12/26/2018  URIYAH RASKA 1946/02/16 [id number]   Care coordination   [facility] RN CM spoke with Abby at Dr Alain Marion office to discussed need for possible earlier f/u appointment after discharge from snf. She placed him on the office cancellation list for a possible earlier appointment. CM discussed also that he is newly dx with DM without a glucose meter. A note was sent to the office nurse related to a order being sent to his pharmacy for a glucose meter   Plans [facility] RN CM will follow back up with Mr Chenier within 7 -10 business days related to his MD appointment, glucose meter, constipation and receipt of EMMI materials   Patrica Mendell L. Lavina Hamman, RN, BSN, [place] Coordinator Office number [phone] Mobile number [phone]  Main [facility] number [phone] Fax number [phone]

## 2018-12-27 ENCOUNTER — Other Ambulatory Visit: Payer: Self-pay | Admitting: *Deleted

## 2018-12-27 NOTE — Patient Outreach (Signed)
  [name]) Care Management  12/27/2018  SLAT[place] Harold Waters August 17, 1946 [id number]   Care coordination follow up with case closure   Insurance: HTA   Outreach attempt # 2 successful at his mobile number  Patient is able to verify HIPAA Reviewed and addressed follow up items to include earlier primary care appointment, glucose meter constipation and receipt of EMMI materials   [name] states he was able to get an earlier appointment for 12/28/2018 and will get his new glucose meter at that time He reports relief from constipation He is not able to confirm he has received his EMMI materials He reports his daughter assists with his e mails and may have the EMMI materials He will return a call to [facility] RN CM if EMMIs need to be resent  [facility] RN CM spoke with him about the [facility] health coach services again since he is a new dx Diabetes patient but has some experience with DM because his wife is diabetic  He agrees to a referral to [place] coach services if available with this Telephonic Cm case closure  He was not interested in community RN CM He is becoming more active    Social: [name] lives with his wife, [name] who also is diabetic He reports learning about the diabetic diet with her.  He has support of his daughter [name] and son, [name]  He is independent/assist with care and iADLs. He denies issues with transportation to medical appointments    Conditions: hx of PVD and left LE claudication, elective Fem-Pop bypass on 12/06/18, constipation, hx of MI, unstable angina hx, hx of PNA,  Neoplasm of skin, DM type 2, GERD, HTN, HDL, CAD, PSVT,  Venous insufficiency, PAD, migraine, osteoarthritis, pain in right shoulder, hx of appendectomy      Advance directives: Denies need for assist with advance directives   Consent: [facility] RN CM reviewed [facility] services with patient. Patient gave verbal consent for services. Advised patient that other post discharge calls may occur  to assess how the patient is doing following the recent hospitalization. Patient voiced understanding and was appreciative of f/u call.  Plan: [facility] RN CM will refer [name] to [facility] health coach for further education on diabetes    L. Lavina Hamman, RN, BSN, [place] Coordinator Office number [phone] Mobile number [phone]  Main [facility] number [phone] Fax number [phone]

## 2018-12-27 NOTE — Telephone Encounter (Signed)
Pt is being seen tomorrow will address at [place]

## 2018-12-28 ENCOUNTER — Telehealth: Payer: Self-pay | Admitting: Internal Medicine

## 2018-12-28 ENCOUNTER — Ambulatory Visit (INDEPENDENT_AMBULATORY_CARE_PROVIDER_SITE_OTHER): Payer: PPO | Admitting: Internal Medicine

## 2018-12-28 ENCOUNTER — Encounter: Payer: Self-pay | Admitting: Internal Medicine

## 2018-12-28 DIAGNOSIS — E785 Hyperlipidemia, unspecified: Secondary | ICD-10-CM | POA: Diagnosis not present

## 2018-12-28 DIAGNOSIS — E118 Type 2 diabetes mellitus with unspecified complications: Secondary | ICD-10-CM

## 2018-12-28 DIAGNOSIS — I10 Essential (primary) hypertension: Secondary | ICD-10-CM | POA: Diagnosis not present

## 2018-12-28 DIAGNOSIS — I739 Peripheral vascular disease, unspecified: Secondary | ICD-10-CM

## 2018-12-28 DIAGNOSIS — I25119 Atherosclerotic heart disease of native coronary artery with unspecified angina pectoris: Secondary | ICD-10-CM | POA: Diagnosis not present

## 2018-12-28 NOTE — Progress Notes (Signed)
Subjective:  Patient ID: Harold Waters, male    DOB: Oct 23, 1946  Age: 73 y.o. MRN: [id number]  CC: No chief complaint on file.   HPI Harold Waters presents for PVD, CAD, DM f/u. Pt was d/c on 12/12/18.  Per hx:  "Hospital Course: Mr. Harold Waters is a 73 year old male who was brought in as an outpatient for left femoral to above-the-knee popliteal artery bypass with vein by Dr. Donzetta Matters on 12/06/2018 due to lifestyle limiting left leg claudication.  He tolerated the procedure well and was admitted to the hospital postoperatively.  Much of his hospital stay involved increasing mobility and pain control.  Physical therapy and Occupational Therapy evaluated the patient and recommended placement in skilled nursing facility.  Clinical social worker was consulted for SNF placement.  He was accepted to [facility].  He will also be prescribed a rolling walker.  He maintained a palpable left DP pulse throughout his hospital stay.  He will follow-up with Dr. Donzetta Matters in about 2 weeks.  Discharge instructions were reviewed with the patient.  He will be discharged this afternoon to [facility] in stable condition."   Outpatient Medications Prior to Visit  Medication Sig Dispense Refill  . aspirin EC 81 MG tablet Take 81 mg by mouth daily.    . carvedilol (COREG) 3.125 MG tablet TAKE 1 TABLET BY MOUTH 2 (TWO) TIMES DAILY WITH A MEAL. 180 tablet 3  . clopidogrel (PLAVIX) 75 MG tablet Take 75 mg by mouth daily.    . isosorbide mononitrate (IMDUR) 60 MG 24 hr tablet Take 1 tablet (60 mg total) by mouth daily. 90 tablet 3  . metFORMIN (GLUCOPHAGE) 500 MG tablet Take 1 tablet (500 mg total) by mouth 2 (two) times daily with a meal. 60 tablet 2  . Multiple Vitamins-Minerals (MENS MULTIVITAMIN PLUS) TABS Take 1 tablet by mouth at bedtime.     . nitroGLYCERIN (NITROSTAT) 0.4 MG SL tablet Place 1 tablet under tongue every 5 mins. DO NOT USE MORE THAN 3 TABS 25 tablet 1  . oxyCODONE (OXY IR/ROXICODONE) 5 MG  immediate release tablet Take 1 tablet (5 mg total) by mouth every 6 (six) hours as needed for moderate pain. 20 tablet 0  . rosuvastatin (CRESTOR) 40 MG tablet TAKE 1 TABLET BY MOUTH DAILY. PLEASE MAKE OVERDUE YEARLY APPT WITH DR. [name] BEFORE ANYMORE REFILLS (Patient taking differently: Take 40 mg by mouth daily. ) 90 tablet 3  . isosorbide mononitrate (IMDUR) 30 MG 24 hr tablet Take 60 mg by mouth daily.     No facility-administered medications prior to visit.     ROS: Review of Systems  Constitutional: Positive for fatigue. Negative for appetite change and unexpected weight change.  HENT: Negative for congestion, nosebleeds, sneezing, sore throat and trouble swallowing.   Eyes: Negative for itching and visual disturbance.  Respiratory: Negative for cough.   Cardiovascular: Negative for chest pain, palpitations and leg swelling.  Gastrointestinal: Negative for abdominal distention, blood in stool, diarrhea and nausea.  Genitourinary: Negative for frequency and hematuria.  Musculoskeletal: Positive for arthralgias and gait problem. Negative for back pain, joint swelling and neck pain.  Skin: Negative for rash.  Neurological: Negative for dizziness, tremors, speech difficulty and weakness.  Psychiatric/Behavioral: Negative for agitation, dysphoric mood, sleep disturbance and suicidal ideas. The patient is not nervous/anxious.     Objective:  BP 128/72 (BP Location: Left Arm, Patient Position: Sitting, Cuff Size: Normal)   Pulse 81   Temp 98.1 F (  36.7 C) (Oral)   Ht 5\' 5"  (1.651 m)   Wt 169 lb (76.7 kg)   SpO2 96%   BMI 28.12 kg/m   BP Readings from Last 3 Encounters:  12/28/18 128/72  12/23/18 122/68  12/12/18 131/63    Wt Readings from Last 3 Encounters:  12/28/18 169 lb (76.7 kg)  12/23/18 175 lb (79.4 kg)  12/12/18 164 lb 1.6 oz (74.4 kg)    Physical Exam Constitutional:      General: He is not in acute distress.    Appearance: He is well-developed.      Comments: NAD  Eyes:     Conjunctiva/sclera: Conjunctivae normal.     Pupils: Pupils are equal, round, and reactive to light.  Neck:     Musculoskeletal: Normal range of motion.     Thyroid: No thyromegaly.     Vascular: No JVD.  Cardiovascular:     Rate and Rhythm: Normal rate and regular rhythm.     Heart sounds: Normal heart sounds. No murmur. No friction rub. No gallop.   Pulmonary:     Effort: Pulmonary effort is normal. No respiratory distress.     Breath sounds: Normal breath sounds. No wheezing or rales.  Chest:     Chest wall: No tenderness.  Abdominal:     General: Bowel sounds are normal. There is no distension.     Palpations: Abdomen is soft. There is no mass.     Tenderness: There is no abdominal tenderness. There is no guarding or rebound.  Musculoskeletal: Normal range of motion.        General: No tenderness.  Lymphadenopathy:     Cervical: No cervical adenopathy.  Skin:    General: Skin is warm and dry.     Findings: No rash.  Neurological:     Mental Status: He is alert and oriented to person, place, and time.     Cranial Nerves: No cranial nerve deficit.     Motor: No abnormal muscle tone.     Coordination: Coordination normal.     Gait: Gait normal.     Deep Tendon Reflexes: Reflexes are normal and symmetric.  Psychiatric:        Behavior: Behavior normal.        Thought Content: Thought content normal.        Judgment: Judgment normal.   Cane LLE scar LLE edema Lab Results  Component Value Date   WBC 5.6 12/07/2018   HGB 11.2 (L) 12/07/2018   HCT 34.3 (L) 12/07/2018   PLT 207 12/07/2018   GLUCOSE 197 (H) 12/07/2018   CHOL 156 05/03/2017   TRIG 150 (H) 05/03/2017   HDL 46 05/03/2017   LDLDIRECT 113.2 08/19/2011   LDLCALC 80 05/03/2017   ALT 33 11/28/2018   AST 20 11/28/2018   NA 138 12/07/2018   K 4.4 12/07/2018   CL 104 12/07/2018   CREATININE 1.11 12/07/2018   BUN 17 12/07/2018   CO2 25 12/07/2018   TSH 4.57 (H) 12/24/2014   PSA  3.69 12/24/2014   INR 0.99 11/28/2018   HGBA1C 8.8 (H) 12/06/2018    No results found.  Assessment & Plan:   There are no diagnoses linked to this encounter.   No orders of the defined types were placed in this encounter.    Follow-up: No follow-ups on file.  Walker Kehr, MD

## 2018-12-28 NOTE — Assessment & Plan Note (Signed)
Metformin 500 mg bid °

## 2018-12-28 NOTE — Patient Instructions (Addendum)
Aquaphor for scars 3-4 times a day  If you have medicare related insurance (such as traditional Medicare, Blue [employer], [employer], or similar), Please make an appointment at the scheduling desk with Sharee Pimple, the [employer], for your Wellness visit in this office, which is a benefit with your insurance.

## 2018-12-28 NOTE — Assessment & Plan Note (Addendum)
No CP Plavix, Coreg, Isosorbide, Crestor

## 2018-12-28 NOTE — Telephone Encounter (Signed)
Copied from [place] [phone]. Topic: Quick Communication - See Telephone Encounter >> Dec 28, 2018  4:14 PM Margot Ables wrote: CRM for notification. See Telephone encounter for: 12/28/18.  Pts daughter called stating her father was diagnosed with diabetes while he was in the hospital following surgery. Pt daughter is concerned that pt did not get prescribed meter and testing supplies to check his sugar. Pts wife is diabetic and checks her 2/day. Should pt check sugar levels? Can order be sent for meter and supplies? How often should pt check? Pt advised he was just told to continue metformin.  [facility] [id number] [place] [address], [place] [address] [phone] (Phone) [phone] (Fax)

## 2018-12-28 NOTE — Assessment & Plan Note (Signed)
-   Crestor 

## 2018-12-28 NOTE — Assessment & Plan Note (Signed)
Coreg

## 2018-12-28 NOTE — Assessment & Plan Note (Signed)
Plavix, Crestor S/p left femoral to above-the-knee popliteal artery bypass with vein by Dr. Donzetta Matters on 12/06/2018 due to lifestyle limiting left leg claudication.

## 2018-12-29 ENCOUNTER — Other Ambulatory Visit (INDEPENDENT_AMBULATORY_CARE_PROVIDER_SITE_OTHER): Payer: PPO

## 2018-12-29 ENCOUNTER — Other Ambulatory Visit: Payer: Self-pay | Admitting: *Deleted

## 2018-12-29 DIAGNOSIS — I739 Peripheral vascular disease, unspecified: Secondary | ICD-10-CM | POA: Diagnosis not present

## 2018-12-29 DIAGNOSIS — E118 Type 2 diabetes mellitus with unspecified complications: Secondary | ICD-10-CM

## 2018-12-29 LAB — CBC WITH DIFFERENTIAL/PLATELET
Basophils Absolute: 0 10*3/uL (ref 0.0–0.1)
Basophils Relative: 0.9 % (ref 0.0–3.0)
Eosinophils Absolute: 0.3 10*3/uL (ref 0.0–0.7)
Eosinophils Relative: 7.3 % — ABNORMAL HIGH (ref 0.0–5.0)
HCT: 33.5 % — ABNORMAL LOW (ref 39.0–52.0)
Hemoglobin: 11.4 g/dL — ABNORMAL LOW (ref 13.0–17.0)
Lymphocytes Relative: 25.4 % (ref 12.0–46.0)
Lymphs Abs: 1.2 10*3/uL (ref 0.7–4.0)
MCHC: 34.1 g/dL (ref 30.0–36.0)
MCV: 86.6 fl (ref 78.0–100.0)
Monocytes Absolute: 0.3 10*3/uL (ref 0.1–1.0)
Monocytes Relative: 7 % (ref 3.0–12.0)
Neutro Abs: 2.8 10*3/uL (ref 1.4–7.7)
Neutrophils Relative %: 59.4 % (ref 43.0–77.0)
Platelets: 381 10*3/uL (ref 150.0–400.0)
RBC: 3.87 Mil/uL — ABNORMAL LOW (ref 4.22–5.81)
RDW: 14.6 % (ref 11.5–15.5)
WBC: 4.7 10*3/uL (ref 4.0–10.5)

## 2018-12-29 LAB — BASIC METABOLIC PANEL
BUN: 18 mg/dL (ref 6–23)
CO2: 30 mEq/L (ref 19–32)
Calcium: 9.3 mg/dL (ref 8.4–10.5)
Chloride: 104 mEq/L (ref 96–112)
Creatinine, Ser: 1.18 mg/dL (ref 0.40–1.50)
GFR: 60.5 mL/min (ref >60.00)
Glucose, Bld: 165 mg/dL — ABNORMAL HIGH (ref 70–99)
Potassium: 4.4 mEq/L (ref 3.5–5.1)
Sodium: 141 mEq/L (ref 135–145)

## 2018-12-29 LAB — HEMOGLOBIN A1C: Hgb A1c MFr Bld: 8.2 % — ABNORMAL HIGH (ref 4.6–6.5)

## 2018-12-29 MED ORDER — GLUCOSE BLOOD VI STRP: ORAL_STRIP | 11 refills | Status: DC

## 2018-12-29 MED ORDER — ONETOUCH VERIO W/DEVICE KIT: 1.0000 [IU] | PACK | Freq: Every day | 1 refills | Status: DC | PRN

## 2018-12-29 NOTE — Telephone Encounter (Signed)
Please advise 

## 2018-12-29 NOTE — Telephone Encounter (Signed)
Hyzaar he has a meter.  I will prescribe One Touch.  Check glucose once a day Thanks

## 2018-12-30 ENCOUNTER — Other Ambulatory Visit: Payer: Self-pay

## 2018-12-30 MED ORDER — GLUCOSE BLOOD VI STRP: ORAL_STRIP | 11 refills | Status: DC

## 2019-01-01 ENCOUNTER — Other Ambulatory Visit: Payer: Self-pay | Admitting: Internal Medicine

## 2019-01-01 DIAGNOSIS — R739 Hyperglycemia, unspecified: Secondary | ICD-10-CM

## 2019-01-01 MED ORDER — METFORMIN HCL 500 MG PO TABS: 500.0000 mg | ORAL_TABLET | Freq: Three times a day (TID) | ORAL | 11 refills | Status: DC

## 2019-01-20 ENCOUNTER — Other Ambulatory Visit: Payer: Self-pay | Admitting: *Deleted

## 2019-01-20 NOTE — Patient Outreach (Signed)
[facility] Care Management  01/20/2019  Harold Waters 1946-01-05 [id number]   RN Health Coach Initial Assessment  Referral Date:  12/28/2018 Referral Source:  [facility] Telephonic Screening Reason for Referral:  Disease Management Education Insurance:  [employer]   Outreach Attempt:  Outreach attempt #1 to patient for introduction and initial telephone assessment.  Wife answered and stated patient was not home.  HIPAA compliant message left with wife.   Plan:  RN Health Coach will make another outreach attempt within the month of March if no return call back from patient.   [place] [phone] [email or web address]

## 2019-01-27 ENCOUNTER — Other Ambulatory Visit: Payer: Self-pay | Admitting: *Deleted

## 2019-01-27 NOTE — Patient Outreach (Signed)
[place] [facility]) Care Management  01/27/2019  Harold Waters [birth year] [id number]   RN Health Coach Initial Assessment  Referral Date:  12/28/2018 Referral Source:  [facility] Telephonic Screening Reason for Referral:  Disease Management Education Insurance:  [employer] Ad[employer]age   Outreach Attempt:  Outreach attempt #2 to patient for introduction and initial telephone assessment.  Patient answered and verified HIPAA.  RN Health Coach introduced self and role.  Patient verbally agrees to Disease Management outreaches.  States he is getting ready to take his grandchildren home and unable to complete assessment at this time, requesting another telephone call.   Plan:  RN Health Coach will make another outreach attempt within the next 10 business days.  [place] [phone] [email or web address]

## 2019-01-30 ENCOUNTER — Ambulatory Visit: Payer: PPO | Admitting: Internal Medicine

## 2019-01-31 ENCOUNTER — Encounter: Payer: Self-pay | Admitting: *Deleted

## 2019-01-31 ENCOUNTER — Other Ambulatory Visit: Payer: Self-pay | Admitting: *Deleted

## 2019-01-31 NOTE — Patient Outreach (Signed)
[place] [facility]) Care Management  [place]  01/31/2019   QUENTEN NAWAZ 1945-12-13 [id number]   [place] Initial Assessment   Referral Date:  12/28/2018 Referral Source:  [facility] Telephonic Screening Reason for Referral:  Disease Management Education Insurance:  Health Team Advantage    Outreach Attempt:  Successful telephone outreach to patient for initial telephone assessment.  HIPAA verified with patient.  Initial telephone assessment completed.  Social:  Patient lives at home with wife.  Reports being independent with ADLs and wife assisting with IADLs.  Ambulates independently and denies any falls in the past year.  Wife and daughter transports to medical appointments.  Patient is reporting financial hardship while he is not working with paying bills, but has been able to seek assistance from family members.  DME in the home include:  Straight cane, rolling walker, eyeglasses, grab bars in shower bedside commode, and shower with back.  Conditions:  Per chart review and discussion with patient, PMH include but not limited to:  Dyslipidemia,  Type 2 diabetes, coronary artery disease with intervention, peripheral artery disease with recent peripheral bypass surgery, hypertension, angina, lower extremity deep vein thrombosis, erosive gastritis, GERD, kidney stones, myocardial infarction, osteoarthritis, paroxysmal supraventricular tachycardia, and sleep apnea.  Underwent left femoral to above the knee popliteal artery bypass grafting on 12/06/2018.  Discharged to short term skilled nursing facility for rehab and now is home.  Reports incisions are healing without signs and symptoms of infections.  Does report some incisional pain and burning at night and swelling in left leg with activity that resolves after elevation at night.  Encouraged patient to discuss these symptoms with Vascular Surgeon.  Newly diagnosed diabetes in February 2020.  Hgb A1C is currently 8.2 and  reports being started on Metformin.  Obtained a CBG meter, but only monitors he blood sugars when he does not feel well.  Discussed importance of blood sugar monitoring at least daily and encouraged patient to do so.  Medications:  Patient reports taking about 7 medications daily.  Reports he manages his medications himself and denies any difficulties affording his co pays at this time.  Upon medication review, patient not taking his Coreg as prescribed twice a day.  Reports taking the Coreg twice a day (with a meal) was giving him stomach burning; so his is only taking it once daily.  Since taking medication daily the stomach burning has subsided.  Reports he is not taking Imdur and he is not sure why.  Encouraged patient to discuss medications with primary care provider and/or Cardiologist.  Discussed [facility] Pharmacist referral for medication compliance and drug side effects.  Patient verbally agrees.  Encounter Medications:  Outpatient Encounter Medications as of 01/31/2019  Medication Sig Note  . aspirin EC 81 MG tablet Take 81 mg by mouth daily.   . Blood Glucose Monitoring Suppl (ONETOUCH VERIO) w/Device KIT 1 Units by Does not apply route daily as needed.   . carvedilol (COREG) 3.125 MG tablet TAKE 1 TABLET BY MOUTH 2 (TWO) TIMES DAILY WITH A MEAL. 01/31/2019: Currently taking 1 tablet once a day due to it burning his stomach per patient (encouraged to discuss this with his providers)  . clopidogrel (PLAVIX) 75 MG tablet Take 75 mg by mouth daily.   [name] glucose blood (ONETOUCH VERIO) test strip Use to check blood sugar daily   . metFORMIN (GLUCOPHAGE) 500 MG tablet Take 1 tablet (500 mg total) by mouth 3 (three) times daily. 01/31/2019: Reports  taking 500 mg twice a day  . Multiple Vitamins-Minerals (MENS MULTIVITAMIN PLUS) TABS Take 1 tablet by mouth at bedtime.    . nitroGLYCERIN (NITROSTAT) 0.4 MG SL tablet Place 1 tablet under tongue every 5 mins. DO NOT USE MORE THAN 3 TABS 11/16/2018: Has not  picked up yet  . rosuvastatin (CRESTOR) 40 MG tablet TAKE 1 TABLET BY MOUTH DAILY. PLEASE MAKE OVERDUE YEARLY APPT WITH DR. [name] BEFORE ANYMORE REFILLS (Patient taking differently: Take 40 mg by mouth daily. )   . isosorbide mononitrate (IMDUR) 60 MG 24 hr tablet Take 1 tablet (60 mg total) by mouth daily. (Patient not taking: Reported on 01/31/2019) 01/31/2019: Reports not taking medication   No facility-administered encounter medications on file as of 01/31/2019.     Functional Status:  In your present state of health, do you have any difficulty performing the following activities: 01/31/2019 12/06/2018  Hearing? N -  Vision? Y -  Comment trouble seeing at night time -  Difficulty concentrating or making decisions? Y -  Comment trouble remembering things -  Walking or climbing stairs? Y -  Comment leg swells with walking long distances -  Dressing or bathing? N -  Doing errands, shopping? N N  Preparing Food and eating ? N -  Using the Toilet? N -  In the past six months, have you accidently leaked urine? N -  Do you have problems with loss of bowel control? N -  Managing your Medications? N -  Managing your Finances? Y -  Comment trouble paying bills since he is not working -  [place], broadcasting/film/video? Y -  Comment wife assist with cleaning -  Some recent data might be hidden    Fall/Depression Screening: Fall Risk  01/31/2019 06/22/2016 12/19/2014  Falls in the past year? 0 No No  Risk for fall due to : Impaired balance/gait;Impaired mobility - -  Follow up Falls evaluation completed;Falls prevention discussed;Education provided - -   PHQ 2/9 Scores 01/31/2019 12/23/2018 06/22/2016 12/19/2014  PHQ - 2 Score 0 0 0 0   THN CM Care Plan Problem One     Most Recent Value  Care Plan Problem One  Knowledge deficiet related to diagnosis of diabetes and self care management.  Role Documenting the Problem One  [place] for Problem One  Active  THN Long  Term Goal   Patient will decrease Hgb A1C by 0.2 points in the next 90 days.  THN Long Term Goal Start Date  01/31/19  Interventions for Problem One Long Term Goal  Current care plan and goals reviewed and discussed with patient, reviewed medications and indications and encouraged medication compliance, [place] referral for medication compliance and review, encouraged to keep and attend medical appointments-discussed making cardiology and primary care follow up appointments, encouraged to discuss medications he is not taking as prescribed, encouraged patient to monitor blood sugars at least daily, reviewed and educated the meaning of Hgb A1C and what his current measurement is  THN CM Short Term Goal #1   Patient will report checking his blood sugar daily in the next 60 days.  THN CM Short Term Goal #1 Start Date  01/31/19  Interventions for Short Term Goal #1  Sending 2020 Calendar Booklet to help with documentation of blood sugars, discussed importance of monitoring blood sugars, reviewed and discussed signs and symptoms of hypo and hyperglycemia, confirmed patient has CBG meter and knows how to use it  [facility] CM  Short Term Goal #2   Patient will report recieving and reviewing Living Well with Diabetes Educational Packet in the next 60 days.  THN CM Short Term Goal #2 Start Date  01/31/19  Interventions for Short Term Goal #2  Sending Living Well with Diabetes Educational Packet, enocuraged patient to review educational material, encouraged patient to discuss attending formal diabetes and nutrition classes with primary care provider     Appointments:  Attended appointment with primary care provider, Dr. Alain Marion on 12/28/2018 and needs to schedule follow up appointment.  Has follow up appointment with Dr. Donzetta Matters, Vascular Surgeon on 03/24/2019.  Last seen Cardiology on 11/25/2018 and needs to schedule follow up appointment.  Advanced Directives:  Denies having Advance Directive in place.  Would like  information to create one.   Consent:  [facility] services reviewed and discussed.  Patient verbally agrees to Disease Management outreaches and [place] referral.  Plan: [place] will send primary MD barriers letter. RN Health Coach will route initial telephone assessment note to primary MD. [place] will send [facility] pharmacy referral for  [place] will send patient [place]. RN Health Coach will send patient Living Well with Diabetes Booklet. RN Health Coach will send patient 2020 Calendar Booklet. RN Health Coach will send patient Emergency planning/management officer. RN Health Coach will make next telephone outreach to patient in the month of May.  [place] [phone] Shalimar [email or web address]

## 2019-02-01 ENCOUNTER — Telehealth: Payer: Self-pay | Admitting: Pharmacist

## 2019-02-01 NOTE — Patient Outreach (Addendum)
[facility] [facility]) Care Management  [place]   02/01/2019  Harold Waters [birth year] [id number]  Reason for referral: Medication Assistance, Medication Review  Referral source: [facility] RN Current insurance: Health Team Advantage  PMHx includes but not limited to:  , Type 2 diabetes, coronary artery disease with left femoral to above the knee popliteal artery bypass grafting on 12/06/2018, peripheral artery disease, hypertension, hyperlipidemia, angina, lower extremity deep vein thrombosis, erosive gastritis, GERD, kidney stones, myocardial infarction, osteoarthritis, paroxysmal supraventricular tachycardia, and sleep apnea.    Outreach:  Successful telephone call with patient.  HIPAA identifiers verified.   Subjective:  Patient does not use an adherence strategy.  Does the patient ever forget to take medication?  yes Does the patient have problems obtaining medications due to transportation?   no Does the patient have problems obtaining medications due to cost?  yes  Does the patient feel that medications prescribed are effective?  yes Does the patient ever experience any side effects to the medications prescribed?  yes  Does the patient measure his/her own blood glucose at home?  Yes Does the patient measure his/her own blood pressure at home? No   Objective: Lab Results  Component Value Date   CREATININE 1.18 12/29/2018   CREATININE 1.11 12/07/2018   CREATININE 1.13 12/06/2018    Lab Results  Component Value Date   HGBA1C 8.2 (H) 12/29/2018    Lipid Panel     Component Value Date/Time   CHOL 156 05/03/2017 0732   TRIG 150 (H) 05/03/2017 0732   HDL 46 05/03/2017 0732   CHOLHDL 3.4 05/03/2017 0732   CHOLHDL 4 12/24/2014 0802   VLDL 22.2 12/24/2014 0802   LDLCALC 80 05/03/2017 0732   LDLDIRECT 113.2 08/19/2011 0855    BP Readings from Last 3 Encounters:  12/28/18 128/72  12/23/18 122/68  12/12/18 131/63    Allergies  Allergen Reactions  .  Eggs Or Egg-Derived Products Itching    Medications Reviewed Today    Reviewed by Elayne Guerin, [facility] (Pharmacist) on 02/01/19 at [place] List Status: <None>  Medication Order Taking? Sig Documenting Provider Last Dose Status Informant  aspirin EC 81 MG tablet [id number] Yes Take 81 mg by mouth daily. [provider] Taking Active Self  Blood Glucose Monitoring Suppl (ONETOUCH VERIO) w/Device KIT [id number] No 1 Units by Does not apply route daily as needed.  Patient not taking:  Reported on 02/01/2019   Plotnikov, [name], MD Not Taking Active   carvedilol (COREG) 3.125 MG tablet [id number] Yes TAKE 1 TABLET BY MOUTH 2 (TWO) TIMES DAILY WITH A MEAL. [name], NP Taking Active            Med Note [name] [facility]   Tue Jan 31, 2019 11:31 AM) Currently taking 1 tablet once a day due to it burning his stomach per patient (encouraged to discuss this with his providers)  clopidogrel (PLAVIX) 75 MG tablet [id number] Yes Take 75 mg by mouth daily. [provider] Taking Active Self  glucose blood (ONETOUCH VERIO) test strip [id number] No Use to check blood sugar daily  Patient not taking:  Reported on 02/01/2019   Plotnikov, [name], MD Not Taking Active   isosorbide mononitrate (IMDUR) 60 MG 24 hr tablet [id number] Yes Take 1 tablet (60 mg total) by mouth daily. Erlene Quan, PA-C Taking Active Self           Med Note Luciana Axe, Zackery Barefoot Feb 01, 2019  3:04 PM)    metFORMIN (GLUCOPHAGE) 500 MG tablet [id number] Yes Take 1 tablet (500 mg total) by mouth 3 (three) times daily. Plotnikov, [name], MD Taking Active            Med Note [name] [facility]   Tue Jan 31, 2019 11:35 AM) Reports taking 500 mg twice a day  Multiple Vitamins-Minerals (MENS MULTIVITAMIN PLUS) TABS [id number] Yes Take 1 tablet by mouth at bedtime.  [provider] Taking Active Self  nitroGLYCERIN (NITROSTAT) 0.4 MG SL tablet [id number] Yes Place 1 tablet under tongue every 5 mins. DO NOT USE  MORE THAN 3 TABS Kilroy, [name], PA-C Taking Active Self           Med Note Vianne Bulls Nov 16, 2018  2:47 PM) Has not picked up yet  rosuvastatin (CRESTOR) 40 MG tablet [id number] Yes TAKE 1 TABLET BY MOUTH DAILY. PLEASE MAKE OVERDUE YEARLY APPT WITH DR. [name] BEFORE ANYMORE REFILLS  Patient taking differently:  Take 40 mg by mouth daily.    [name], MD Taking Active Self          Assessment:  Drugs sorted by system:  Cardiovascular: Aspirin, Carvedilol, Rosuvastatin, Nitroglycerin  Endocrine: Metformin,  Vitamins/Minerals/Supplements: Multiple Vitamin,   Medication Review Findings:   Adherence-Carvedilol-patient reported only taking 1 tablet daily because it caused his stomach to burn.  Patient said he took both doses with food and decided to decrease the dose on his own. In addition, he said he did not alert his provider.   Glucose Control-patient's most recent HgA1c was 8.2%. If deemed therapeutically appropriate, his therapy may need to be optimized.  Metformin dose could be titrated towards max of 2050m daily.  If cost were not an issue, a GLP-1 may be an option. SGLT2 inhibitor could also be an option. JVania Reais not difficult to get from [employer]'s patient assistance program (if cost is an issue).   In addition, Januvia could be added. Januvia and Janumet are pretty easy to obtain at no cost from [facility]. Medication Adherence Findings: Adherence Review  [] Excellent (no doses missed/week)     [x] Good (no more than 1 dose missed/week)     [] Partial (2-3 doses missed/week) [] Poor (>3 doses missed/week)  Patient with good understanding of regimen and good understanding of indications.    Potential of compliance: fair  Medication Assistance Findings:  No medication assistance needs identified   All of the patient's medications are tier 1-2 meds so there are no available programs. Patient said it is his  wife who has the expensive medications. The patient's wife was referred for medication assistance last year but she never returned the applications that were sent to her. Patient was encouraged to have his daughter call me back as there is a slight language barrier and he said his daughter (Jolayne Panther handles all his wife's affairs. (I spoke with his daughter VMateo Flowin December of last year.)   Extra Help:  Not eligible for Extra Help Low Income Subsidy based on reported income and assets  Plan: . Will contact Prescriber regarding side effect with carvedilol and adherence..  . Will follow-up in 5-7 days..Elayne Guerin PharmD, BWeirClinical Pharmacist [phone]

## 2019-02-01 NOTE — Telephone Encounter (Signed)
-----   Message from Jiles Harold sent at 01/31/2019  2:18 PM EDT ----- Regarding: Referral: Order for Arther Dames  Referral from Hubert Azure, RN  "Please see below request"  Forde Radon ----- Message ----- From: Leona Singleton, RN Sent: 01/31/2019   2:08 PM EDT To: Thn Cm Communication Orders Subject: Order for FIORE, DETJEN                        Patient Name: Harold Waters, Harold Waters([id number]) Sex: Male DOB: 1946-02-05    PCP: Cassandria Anger   Center: [facility]   Types of orders made on 01/31/2019: Nursing  Order Date:01/31/2019 [place], [place] [[id number]] Encounter Provider:Leona Singleton, RN [phone] Authorizing Provide r: Plotnikov, [name], MD [[id number]] Department:[facility]COMM[facility]ITY[[id number]]  Order Specific Information Order: Comm to Pharmacy [Custom: [id number]]  Order #: [id number] Qty: 1   Priority: Routine  Class: Clinic Performed   Comment:[facility] Pharmacy Referral            Referral from:  [place]            Medication management-patient having trouble managing             meds--experiencing side effects f rom Coreg making him take it only             once a day and not twice a day as ordered, not taking Imdur unsure             why, does not have prescription when reviewing his medications with             me            Please see RN Health Coach note for further details.     Reason for Consult -> Medication Adherence       Priority: Routine  Class: Clinic Performed   Comment:[facility] Pharmacy Referral            Referral f rom:  RN Health Coach            Medication management-patient having trouble managing             meds--experiencing side effects from Coreg making him take it only             once a day and not twice a day as ordered, not taking Imdur unsure             why, does not have prescription when reviewing his medications with             me            Please see RN Health Coach note for further  details.     Reason for Consult -> Medication Ad herence

## 2019-02-06 ENCOUNTER — Telehealth: Payer: Self-pay | Admitting: *Deleted

## 2019-02-06 ENCOUNTER — Other Ambulatory Visit: Payer: Self-pay | Admitting: *Deleted

## 2019-02-06 DIAGNOSIS — I70213 Atherosclerosis of native arteries of extremities with intermittent claudication, bilateral legs: Secondary | ICD-10-CM

## 2019-02-06 NOTE — Telephone Encounter (Signed)
Call from patient's daughter. Patient having significant swelling in LLE. Claims skin is intact. Claims swelling is not any worse than when in office but has not improved and is limiting patient's ability to ambulate without pain. She states left foot has increase temp and color is darker pink than right foot. Will order vascular studies and office appointment.

## 2019-02-07 ENCOUNTER — Encounter: Payer: Self-pay | Admitting: Vascular Surgery

## 2019-02-07 ENCOUNTER — Other Ambulatory Visit: Payer: Self-pay

## 2019-02-07 ENCOUNTER — Ambulatory Visit (HOSPITAL_COMMUNITY)
Admission: RE | Admit: 2019-02-07 | Discharge: 2019-02-07 | Disposition: A | Discharge status: Home / Self Care | Payer: PPO | Source: Ambulatory Visit | Attending: Vascular Surgery | Admitting: Vascular Surgery

## 2019-02-07 ENCOUNTER — Ambulatory Visit (INDEPENDENT_AMBULATORY_CARE_PROVIDER_SITE_OTHER)
Admission: RE | Admit: 2019-02-07 | Discharge: 2019-02-07 | Disposition: A | Discharge status: Home / Self Care | Payer: PPO | Source: Ambulatory Visit | Attending: Vascular Surgery | Admitting: Vascular Surgery

## 2019-02-07 ENCOUNTER — Ambulatory Visit (INDEPENDENT_AMBULATORY_CARE_PROVIDER_SITE_OTHER): Payer: Self-pay | Admitting: Vascular Surgery

## 2019-02-07 VITALS — BP 131/76 | HR 74 | Temp 97.2°F | Resp 16 | Ht 65.0 in | Wt 171.0 lb

## 2019-02-07 DIAGNOSIS — Z7982 Long term (current) use of aspirin: Secondary | ICD-10-CM | POA: Diagnosis not present

## 2019-02-07 DIAGNOSIS — Z96643 Presence of artificial hip joint, bilateral: Secondary | ICD-10-CM | POA: Diagnosis not present

## 2019-02-07 DIAGNOSIS — M109 Gout, unspecified: Secondary | ICD-10-CM | POA: Diagnosis not present

## 2019-02-07 DIAGNOSIS — M810 Age-related osteoporosis without current pathological fracture: Secondary | ICD-10-CM | POA: Diagnosis not present

## 2019-02-07 DIAGNOSIS — E559 Vitamin D deficiency, unspecified: Secondary | ICD-10-CM | POA: Diagnosis not present

## 2019-02-07 DIAGNOSIS — F329 Major depressive disorder, single episode, unspecified: Secondary | ICD-10-CM | POA: Diagnosis not present

## 2019-02-07 DIAGNOSIS — I70213 Atherosclerosis of native arteries of extremities with intermittent claudication, bilateral legs: Secondary | ICD-10-CM

## 2019-02-07 DIAGNOSIS — H409 Unspecified glaucoma: Secondary | ICD-10-CM | POA: Diagnosis not present

## 2019-02-07 DIAGNOSIS — F419 Anxiety disorder, unspecified: Secondary | ICD-10-CM | POA: Diagnosis not present

## 2019-02-07 DIAGNOSIS — G629 Polyneuropathy, unspecified: Secondary | ICD-10-CM | POA: Diagnosis not present

## 2019-02-07 DIAGNOSIS — I130 Hypertensive heart and chronic kidney disease with heart failure and stage 1 through stage 4 chronic kidney disease, or unspecified chronic kidney disease: Secondary | ICD-10-CM | POA: Diagnosis not present

## 2019-02-07 DIAGNOSIS — I10 Essential (primary) hypertension: Secondary | ICD-10-CM | POA: Diagnosis not present

## 2019-02-07 DIAGNOSIS — G8918 Other acute postprocedural pain: Secondary | ICD-10-CM | POA: Diagnosis not present

## 2019-02-07 DIAGNOSIS — M7552 Bursitis of left shoulder: Secondary | ICD-10-CM | POA: Diagnosis not present

## 2019-02-07 DIAGNOSIS — G4733 Obstructive sleep apnea (adult) (pediatric): Secondary | ICD-10-CM | POA: Diagnosis not present

## 2019-02-07 DIAGNOSIS — E7800 Pure hypercholesterolemia, unspecified: Secondary | ICD-10-CM | POA: Diagnosis not present

## 2019-02-07 DIAGNOSIS — J45909 Unspecified asthma, uncomplicated: Secondary | ICD-10-CM | POA: Diagnosis not present

## 2019-02-07 DIAGNOSIS — K219 Gastro-esophageal reflux disease without esophagitis: Secondary | ICD-10-CM | POA: Diagnosis not present

## 2019-02-07 DIAGNOSIS — H2511 Age-related nuclear cataract, right eye: Secondary | ICD-10-CM | POA: Diagnosis not present

## 2019-02-07 DIAGNOSIS — N183 Chronic kidney disease, stage 3 (moderate): Secondary | ICD-10-CM | POA: Diagnosis not present

## 2019-02-07 DIAGNOSIS — I2510 Atherosclerotic heart disease of native coronary artery without angina pectoris: Secondary | ICD-10-CM | POA: Diagnosis not present

## 2019-02-07 DIAGNOSIS — Z6839 Body mass index (BMI) 39.0-39.9, adult: Secondary | ICD-10-CM | POA: Diagnosis not present

## 2019-02-07 DIAGNOSIS — Z9181 History of falling: Secondary | ICD-10-CM | POA: Diagnosis not present

## 2019-02-07 DIAGNOSIS — N2581 Secondary hyperparathyroidism of renal origin: Secondary | ICD-10-CM | POA: Diagnosis not present

## 2019-02-07 DIAGNOSIS — I5032 Chronic diastolic (congestive) heart failure: Secondary | ICD-10-CM | POA: Diagnosis not present

## 2019-02-07 DIAGNOSIS — Z96653 Presence of artificial knee joint, bilateral: Secondary | ICD-10-CM | POA: Diagnosis not present

## 2019-02-07 DIAGNOSIS — Z952 Presence of prosthetic heart valve: Secondary | ICD-10-CM | POA: Diagnosis not present

## 2019-02-07 DIAGNOSIS — I739 Peripheral vascular disease, unspecified: Secondary | ICD-10-CM

## 2019-02-07 DIAGNOSIS — Z8673 Personal history of transient ischemic attack (TIA), and cerebral infarction without residual deficits: Secondary | ICD-10-CM | POA: Diagnosis not present

## 2019-02-07 DIAGNOSIS — M75122 Complete rotator cuff tear or rupture of left shoulder, not specified as traumatic: Secondary | ICD-10-CM | POA: Diagnosis not present

## 2019-02-07 NOTE — Progress Notes (Signed)
Patient name: Harold Waters MRN: [id number] DOB: 1946/04/17 Sex: male  REASON FOR VISIT: Swelling in left lower extremity after bypass by Dr. Donzetta Matters  HPI: Harold Waters is a 73 y.o. male that presents for evaluation of left leg swelling after left common femoral to above-knee popliteal bypass with ipsilateral reverse saphenous vein on 12/06/2018 for lifestyle limiting claudication by Dr. Donzetta Matters.  Patient states he ha become concerned about ongoing swelling in his left leg.  He denies any pain specifically.  He is having no fevers or chills at home.  He feels all his incisions have healed.  He states that he is able to walk much better since surgery and his claudication has improved.  Past Medical History:  Diagnosis Date  . Angina   . Chronic stomach ulcer    "get them off and on" (09/19/2018)  . Coronary atherosclerosis of unspecified type of vessel, native or graft   . Diverticulosis   . DVT of lower extremity (deep venous thrombosis) ([place]) ~ 2010   LLE  . Erosive gastritis   . Essential hypertension, benign   . GERD (gastroesophageal reflux disease)   . Headache(784.0) 02/18/12   "lately"  . History of kidney stones   . Internal hemorrhoids   . Lower back pain   . Myocardial infarction ([place]) 1997  . Osteoarthritis   . Other and unspecified hyperlipidemia   . PAD (peripheral artery disease) (HCC)    with ABI's 0.8 on the right and 0.86 on the left  . Pneumonia 1957  . PSVT (paroxysmal supraventricular tachycardia) ([place]) 02/18/12  . Shortness of breath    "lying down"  . Sleep apnea    does not wear c-pap; pt does not recall this hx on 09/19/2018    Past Surgical History:  Procedure Laterality Date  . ARTHROSCOPY KNEE W/ DRILLING Right ~ 2001  . BYPASS GRAFT Left 12/05/2018   femoral popliteal   . CARDIAC CATHETERIZATION  09/19/2018  . COLONOSCOPY    . CORONARY ANGIOPLASTY WITH STENT PLACEMENT  1997   "2"  . FEMORAL-POPLITEAL BYPASS GRAFT Left 12/06/2018   Procedure: BYPASS  GRAFT FEMORAL-POPLITEAL ARTERY;  Surgeon: Waynetta Sandy, MD;  Location: [facility];  Service: Vascular;  Laterality: Left;  . ILIAC ARTERY STENT  04/22/2017   . Placement of a 6 mm x 100 mm Viabahn covered stent left SFA  . INTRAVASCULAR PRESSURE WIRE/FFR STUDY  09/19/2018  . INTRAVASCULAR PRESSURE WIRE/FFR STUDY N/A 09/19/2018   Procedure: INTRAVASCULAR PRESSURE WIRE/FFR STUDY;  Surgeon: [name], MD;  Location: [place] CV [facility];  Service: Cardiovascular;  Laterality: N/A;  . LEFT HEART CATH AND CORONARY ANGIOGRAPHY N/A 09/19/2018   Procedure: LEFT HEART CATH AND CORONARY ANGIOGRAPHY;  Surgeon: [name], MD;  Location: [place] CV [facility];  Service: Cardiovascular;  Laterality: N/A;  . LEFT HEART CATHETERIZATION WITH CORONARY ANGIOGRAM N/A 02/19/2012   Procedure: LEFT HEART CATHETERIZATION WITH CORONARY ANGIOGRAM;  Surgeon: Larey Dresser, MD;  Location: [facility] CATH [facility];  Service: Cardiovascular;  Laterality: N/A;  . LOWER EXTREMITY ANGIOGRAPHY Bilateral 04/22/2017   Procedure: Lower Extremity Angiography;  Surgeon: [name], MD;  Location: [place] CV LAB;  Service: Cardiovascular;  Laterality: Bilateral;  . LOWER EXTREMITY ANGIOGRAPHY Bilateral 09/19/2018  . LOWER EXTREMITY ANGIOGRAPHY Bilateral 09/19/2018   Procedure: LOWER EXTREMITY ANGIOGRAPHY;  Surgeon: [name], MD;  Location: [place] CV LAB;  Service: Cardiovascular;  Laterality: Bilateral;  . PERIPHERAL ARTERIAL STENT GRAFT  09/2010  LLE  . PERIPHERAL VASCULAR ATHERECTOMY Left 04/22/2017   Procedure: Peripheral Vascular Atherectomy;  Surgeon: [name], MD;  Location: [place] [facility];  Service: Cardiovascular;  Laterality: Left;  SFA  . PERIPHERAL VASCULAR INTERVENTION Left 04/22/2017   Procedure: Peripheral Vascular Intervention;  Surgeon: [name], MD;  Location: [place] CV LAB;  Service: Cardiovascular;  Laterality: Left;  SFA  . UPPER GASTROINTESTINAL ENDOSCOPY       Family History  Problem Relation Age of Onset  . Ulcers Father        had stomach issues, not sure what happened  . Coronary artery disease Brother        male 1st degree relative <50  . Colon cancer Neg Hx   . Esophageal cancer Neg Hx   . Rectal cancer Neg Hx   . Stomach cancer Neg Hx     SOCIAL HISTORY: Social History   Tobacco Use  . Smoking status: Former Smoker    Packs/day: 1.00    Years: 40.00    Pack years: 40.00    Types: Cigarettes    Last attempt to quit: 04/03/2010    Years since quitting: 8.8  . Smokeless tobacco: Never Used  Substance Use Topics  . Alcohol use: Not Currently    Comment: "used to drink when I was young; quit ~ 1980's"    Allergies  Allergen Reactions  . Eggs Or Egg-Derived Products Itching    Current Outpatient Medications  Medication Sig Dispense Refill  . aspirin EC 81 MG tablet Take 81 mg by mouth daily.    . Blood Glucose Monitoring Suppl (ONETOUCH VERIO) w/Device KIT 1 Units by Does not apply route daily as needed. (Patient not taking: Reported on 02/01/2019) 1 kit 1  . carvedilol (COREG) 3.125 MG tablet TAKE 1 TABLET BY MOUTH 2 (TWO) TIMES DAILY WITH A MEAL. 180 tablet 3  . clopidogrel (PLAVIX) 75 MG tablet Take 75 mg by mouth daily.    [name] glucose blood (ONETOUCH VERIO) test strip Use to check blood sugar daily (Patient not taking: Reported on 02/01/2019) 50 each 11  . isosorbide mononitrate (IMDUR) 60 MG 24 hr tablet Take 1 tablet (60 mg total) by mouth daily. 90 tablet 3  . metFORMIN (GLUCOPHAGE) 500 MG tablet Take 1 tablet (500 mg total) by mouth 3 (three) times daily. 90 tablet 11  . Multiple Vitamins-Minerals (MENS MULTIVITAMIN PLUS) TABS Take 1 tablet by mouth at bedtime.     . nitroGLYCERIN (NITROSTAT) 0.4 MG SL tablet Place 1 tablet under tongue every 5 mins. DO NOT USE MORE THAN 3 TABS 25 tablet 1  . rosuvastatin (CRESTOR) 40 MG tablet TAKE 1 TABLET BY MOUTH DAILY. PLEASE MAKE OVERDUE YEARLY APPT WITH DR. [name] BEFORE ANYMORE  REFILLS (Patient taking differently: Take 40 mg by mouth daily. ) 90 tablet 3   No current facility-administered medications for this visit.     REVIEW OF SYSTEMS:  [X]  denotes positive finding, [ ]  denotes negative finding Cardiac  Comments:  Chest pain or chest pressure:    Shortness of breath upon exertion:    Short of breath when lying flat:    Irregular heart rhythm:        Vascular    Pain in calf, thigh, or hip brought on by ambulation:    Pain in feet at night that wakes you up from your sleep:     Blood clot in your veins:    Leg swelling:  Pulmonary    Oxygen at home:    Productive cough:     Wheezing:         Neurologic    Sudden weakness in arms or legs:     Sudden numbness in arms or legs:     Sudden onset of difficulty speaking or slurred speech:    Temporary loss of vision in one eye:     Problems with dizziness:         Gastrointestinal    Blood in stool:     Vomited blood:         Genitourinary    Burning when urinating:     Blood in urine:        Psychiatric    Major depression:         Hematologic    Bleeding problems:    Problems with blood clotting too easily:        Skin    Rashes or ulcers:        Constitutional    Fever or chills:      PHYSICAL EXAM: Vitals:   02/07/19 0907  BP: 131/76  Pulse: 74  Resp: 16  Temp: (!) 97.2 F (36.2 C)  TempSrc: Oral  SpO2: 98%  Weight: 171 lb (77.6 kg)  Height: 5' 5"  (1.651 m)    GENERAL: The patient is a well-nourished male, in no acute distress. The vital signs are documented above. CARDIAC: There is a regular rate and rhythm.  VASCULAR:  Left groin and leg incisions well healed 2+ edema left leg, tenderness along left medial proximal calf Brisk DP/PT signals left foot - probably near palpable but hard to appreciate with leg swelling PULMONARY: There is good air exchange bilaterally without wheezing or rales. ABDOMEN: Soft and non-tender with normal pitched bowel sounds.     DATA:   Independently reviewed his left lower extremity bypass duplex and his bypass is patent with no evidence of flow-limiting stenosis or evidence of a threatened bypass.  ABIs are 1.24 on the right triphasic and 1.19 on the left and triphasic  Assessment/Plan:  73 year old male that presents for evaluation of left leg swelling since undergoing fem to above-knee pop bypass with vein for claudication.  On duplex today his bypass is patent and I see no evidence of velocities to suggest his bypass is threatened.  His incisions well-healed without issue.  He has a fair amount of 2+ pitting edema in the left lower extremity and I discussed that leg swelling is very common after bypass surgery given the hyperemia.  We discussed conservative measures including leg elevation and gentle Ace wraps at home to assist with the swelling.  He also has evidence of superficial thrombophlebitis on the proximal left calf where he has some tenderness on exam.  Discussed warm compresses and NSAIDs.  Was reassured with his visit today.  I told him he can keep his follow-up with Dr. Donzetta Matters in May.   Marty Heck, MD [facility] [place] Office: [phone] Pager: [phone]

## 2019-02-10 ENCOUNTER — Ambulatory Visit: Payer: Self-pay | Admitting: Pharmacist

## 2019-02-13 ENCOUNTER — Other Ambulatory Visit: Payer: Self-pay | Admitting: Pharmacist

## 2019-02-13 NOTE — Patient Outreach (Signed)
[facility] [facility]) Care Management  02/13/2019  Harold Waters 07-18-1946 [id number]  Patient was called to follow up on carvedilol. HIPAA identifiers were obtained. Patient shared the last time we spoke that he was only taking 1 carvedilol tablet daily versus twice daily as prescribed due to stomach pain.  His provider was sent a note with an inbox message about carvedilol without response.  Patient said today he is still only taking 1 tablet daily.  After discussing administration times and to take with food, patient said he would try to take Carvedilol after breakfast and after dinner to see if that helps.  Plan: Call patient back in 3-4 weeks to follow up.   Elayne Guerin, PharmD, [place] Clinical Pharmacist [phone]

## 2019-03-01 ENCOUNTER — Other Ambulatory Visit: Payer: Self-pay

## 2019-03-01 DIAGNOSIS — I739 Peripheral vascular disease, unspecified: Secondary | ICD-10-CM

## 2019-03-06 ENCOUNTER — Other Ambulatory Visit: Payer: Self-pay | Admitting: Pharmacist

## 2019-03-06 ENCOUNTER — Ambulatory Visit: Payer: Self-pay | Admitting: Pharmacist

## 2019-03-06 NOTE — Patient Outreach (Signed)
Harold Waters) Care Management  03/06/2019  Harold Waters [birth year] [id number]  Called patient to follow up on carvedilol.  HIPAA identifiers were obtained. Patient confirmed he has been taking Carvedilol 3.125 mg 1 tablet twice daily as prescribed.  At our last phone conversation, patient was educated about taking the carvedilol with food because he said it caused him stomach pain when he took it before.  Patient reported that he has been taking Carvedilol with food and that he has not had any side effects.   Medications Reviewed Today    Reviewed by Elayne Guerin, [facility] (Pharmacist) on 03/06/19 at 1016  Med List Status: <None>  Medication Order Taking? Sig Documenting Provider Last Dose Status Informant  aspirin EC 81 MG tablet [id number] Yes Take 81 mg by mouth daily. [provider] Taking Active Self  Blood Glucose Monitoring Suppl (ONETOUCH VERIO) w/Device KIT [id number] No 1 Units by Does not apply route daily as needed.  Patient not taking:  Reported on 02/01/2019   Plotnikov, [name], MD Not Taking Active   carvedilol (COREG) 3.125 MG tablet [id number] Yes TAKE 1 TABLET BY MOUTH 2 (TWO) TIMES DAILY WITH A MEAL. [name], NP Taking Active            Med Note [name] Mar 06, 2019 10:15 AM)    clopidogrel (PLAVIX) 75 MG tablet [id number] Yes Take 75 mg by mouth daily. [provider] Taking Active Self  glucose blood (ONETOUCH VERIO) test strip [id number] No Use to check blood sugar daily  Patient not taking:  Reported on 02/01/2019   Plotnikov, [name], MD Not Taking Active   isosorbide mononitrate (IMDUR) 60 MG 24 hr tablet [id number]  Take 1 tablet (60 mg total) by mouth daily. Erlene Quan, [place]  Expired 02/13/19 2359 Self           Med Note Elayne Guerin   Wed Feb 01, 2019  3:04 PM)    metFORMIN (GLUCOPHAGE) 500 MG tablet [id number] Yes Take 1 tablet (500 mg total) by mouth 3 (three) times daily. Plotnikov, [name], MD Taking  Active            Med Note [name] [facility] Waters D   Tue Jan 31, 2019 11:35 AM) Reports taking 500 mg twice a day  Multiple Vitamins-Minerals (MENS MULTIVITAMIN PLUS) TABS [id number] Yes Take 1 tablet by mouth at bedtime.  [provider] Taking Active Self  nitroGLYCERIN (NITROSTAT) 0.4 MG SL tablet [id number] Yes Place 1 tablet under tongue every 5 mins. DO NOT USE MORE THAN 3 TABS Kilroy, [name], PA-C Taking Active Self           Med Note [name] Mar 06, 2019 10:15 AM)    rosuvastatin (CRESTOR) 40 MG tablet [id number] Yes TAKE 1 TABLET BY MOUTH DAILY. PLEASE MAKE OVERDUE YEARLY APPT WITH DR. [name] BEFORE ANYMORE REFILLS  Patient taking differently:  Take 40 mg by mouth daily.    [name], MD Taking Active Self         Plan: Close patient's pharmacy case. He is still working with a [facility]. I will notify them of pharmacy case closure.  Patient's case can be reopened at anytime in the future if necessary.  Elayne Guerin, PharmD, [place] Clinical Pharmacist [phone]

## 2019-03-08 ENCOUNTER — Other Ambulatory Visit: Payer: Self-pay | Admitting: *Deleted

## 2019-03-08 ENCOUNTER — Encounter: Payer: Self-pay | Admitting: *Deleted

## 2019-03-08 NOTE — Patient Outreach (Signed)
[facility] [place] [facility]) Care Management  03/08/2019  LADARION MUNYON 1946/09/17 [id number]   [place] Monthly Outreach  Referral Date:12/28/2018 Referral Source:TOC [facility] Telephonic Screening Reason for Referral:Disease Management Education Insurance:Health Team Advantage   Outreach Attempt:   Successful telephone outreach to patient for follow up.  HIPAA verified with patient.  Patient stating he is doing well.  States he leg has been swelling and he has been trying to keep it elevated to help with the swelling.  Patient states he has been speaking with physician concerning possible compression wraps.  Continues to monitor his blood sugars only about twice a week ususally after breakfast.  Last weeks post breakfast blood sugars were 170 and 189.  Encouraged patient to monitor blood sugars a tleast daily prior to meal.  Appointments:  Patient cancelled last appointment with primary care provider, Dr. Alain Marion.  Encouraged patient to reschedule appointment as soon as possible.  Attended appointment with vascular surgeon on 02/07/2019 and has follow up scheduled on 03/24/2019.  Plan: RN Health Coach will make another telephone outreach to patient within the month of July. RN Health Coach will resend [employer]. RN Health Coach will resend Living Well with Diabetes Educational Packet. RN Health Coach will resend 2020 Calendar Booklet.  [place] Coach [phone] [email or web address]

## 2019-03-19 ENCOUNTER — Other Ambulatory Visit: Payer: Self-pay | Admitting: Vascular Surgery

## 2019-03-19 DIAGNOSIS — R739 Hyperglycemia, unspecified: Secondary | ICD-10-CM

## 2019-03-24 ENCOUNTER — Other Ambulatory Visit (HOSPITAL_COMMUNITY): Payer: PPO

## 2019-03-24 ENCOUNTER — Encounter: Payer: Self-pay | Admitting: Vascular Surgery

## 2019-03-24 ENCOUNTER — Other Ambulatory Visit: Payer: Self-pay

## 2019-03-24 ENCOUNTER — Ambulatory Visit (INDEPENDENT_AMBULATORY_CARE_PROVIDER_SITE_OTHER): Payer: Self-pay | Admitting: Vascular Surgery

## 2019-03-24 ENCOUNTER — Encounter (HOSPITAL_COMMUNITY): Payer: PPO

## 2019-03-24 ENCOUNTER — Ambulatory Visit (INDEPENDENT_AMBULATORY_CARE_PROVIDER_SITE_OTHER)
Admission: RE | Admit: 2019-03-24 | Discharge: 2019-03-24 | Disposition: A | Discharge status: Home / Self Care | Payer: PPO | Source: Ambulatory Visit | Attending: Vascular Surgery | Admitting: Vascular Surgery

## 2019-03-24 ENCOUNTER — Ambulatory Visit (HOSPITAL_COMMUNITY)
Admission: RE | Admit: 2019-03-24 | Discharge: 2019-03-24 | Disposition: A | Discharge status: Home / Self Care | Payer: PPO | Source: Ambulatory Visit | Attending: Vascular Surgery | Admitting: Vascular Surgery

## 2019-03-24 ENCOUNTER — Ambulatory Visit: Payer: PPO | Admitting: Vascular Surgery

## 2019-03-24 DIAGNOSIS — I739 Peripheral vascular disease, unspecified: Secondary | ICD-10-CM

## 2019-03-24 NOTE — Progress Notes (Signed)
Virtual Visit via Video Note   I connected with Harold Waters on 03/24/2019 using the [employer] virtual platform and verified that I was speaking with the correct person using two identifiers. Patient was located at home and accompanied by his daughter and several other family members were also around. I am located in the vascular vein specialist office.   The limitations of evaluation and management by telemedicine and the availability of in person appointments have been previously discussed with the patient and are documented in the patients chart. The patient expressed understanding and consented to proceed.   PCP: Cassandria Anger, MD   Chief Complaint: Left lower extremity swelling  History of Present Illness: Harold Waters is a 73 y.o. male with previous left limiting claudication has undergone left common femoral to above-knee popliteal artery bypass with vein.  Since that time he has had significant swelling and was even seen in our office for such.  Did have a history of varicose veins prior to the bypass.  Essentially getting along quite well.  Wounds of healed well.  He remains on aspirin and Plavix as well as Crestor.  Past Medical History:  Diagnosis Date  . Angina   . Chronic stomach ulcer    "get them off and on" (09/19/2018)  . Coronary atherosclerosis of unspecified type of vessel, native or graft   . Diverticulosis   . DVT of lower extremity (deep venous thrombosis) ([place]) ~ 2010   LLE  . Erosive gastritis   . Essential hypertension, benign   . GERD (gastroesophageal reflux disease)   . Headache(784.0) 02/18/12   "lately"  . History of kidney stones   . Internal hemorrhoids   . Lower back pain   . Myocardial infarction ([place]) 1997  . Osteoarthritis   . Other and unspecified hyperlipidemia   . PAD (peripheral artery disease) (HCC)    with ABI's 0.8 on the right and 0.86 on the left  . Pneumonia 1957  . PSVT (paroxysmal supraventricular tachycardia) ([place])  02/18/12  . Shortness of breath    "lying down"  . Sleep apnea    does not wear c-pap; pt does not recall this hx on 09/19/2018    Past Surgical History:  Procedure Laterality Date  . ARTHROSCOPY KNEE W/ DRILLING Right ~ 2001  . BYPASS GRAFT Left 12/05/2018   femoral popliteal   . CARDIAC CATHETERIZATION  09/19/2018  . COLONOSCOPY    . CORONARY ANGIOPLASTY WITH STENT PLACEMENT  1997   "2"  . FEMORAL-POPLITEAL BYPASS GRAFT Left 12/06/2018   Procedure: BYPASS GRAFT FEMORAL-POPLITEAL ARTERY;  Surgeon: Waynetta Sandy, MD;  Location: [facility];  Service: Vascular;  Laterality: Left;  . ILIAC ARTERY STENT  04/22/2017   . Placement of a 6 mm x 100 mm Viabahn covered stent left SFA  . INTRAVASCULAR PRESSURE WIRE/FFR STUDY  09/19/2018  . INTRAVASCULAR PRESSURE WIRE/FFR STUDY N/A 09/19/2018   Procedure: INTRAVASCULAR PRESSURE WIRE/FFR STUDY;  Surgeon: [name], MD;  Location: [place] CV LAB;  Service: Cardiovascular;  Laterality: N/A;  . LEFT HEART CATH AND CORONARY ANGIOGRAPHY N/A 09/19/2018   Procedure: LEFT HEART CATH AND CORONARY ANGIOGRAPHY;  Surgeon: [name], MD;  Location: [place] CV LAB;  Service: Cardiovascular;  Laterality: N/A;  . LEFT HEART CATHETERIZATION WITH CORONARY ANGIOGRAM N/A 02/19/2012   Procedure: LEFT HEART CATHETERIZATION WITH CORONARY ANGIOGRAM;  Surgeon: Larey Dresser, MD;  [facility] CATH LAB;  Service: Cardiovascular;  Laterality: N/A;  . LOWER  EXTREMITY ANGIOGRAPHY Bilateral 04/22/2017   Procedure: Lower Extremity Angiography;  Surgeon: [name], MD;  Location: [place] CV LAB;  Service: Cardiovascular;  Laterality: Bilateral;  . LOWER EXTREMITY ANGIOGRAPHY Bilateral 09/19/2018  . LOWER EXTREMITY ANGIOGRAPHY Bilateral 09/19/2018   Procedure: LOWER EXTREMITY ANGIOGRAPHY;  Surgeon: [name], MD;  Location: [place] CV LAB;  Service: Cardiovascular;  Laterality: Bilateral;  . PERIPHERAL ARTERIAL STENT GRAFT   09/2010   LLE  . PERIPHERAL VASCULAR ATHERECTOMY Left 04/22/2017   Procedure: Peripheral Vascular Atherectomy;  Surgeon: [name], MD;  Location: [place] CV LAB;  Service: Cardiovascular;  Laterality: Left;  SFA  . PERIPHERAL VASCULAR INTERVENTION Left 04/22/2017   Procedure: Peripheral Vascular Intervention;  Surgeon: [name], MD;  Location: [place] CV LAB;  Service: Cardiovascular;  Laterality: Left;  SFA  . UPPER GASTROINTESTINAL ENDOSCOPY      Current Meds  Medication Sig  . aspirin EC 81 MG tablet Take 81 mg by mouth daily.  . Blood Glucose Monitoring Suppl (ONETOUCH VERIO) w/Device KIT 1 Units by Does not apply route daily as needed.  . carvedilol (COREG) 3.125 MG tablet TAKE 1 TABLET BY MOUTH 2 (TWO) TIMES DAILY WITH A MEAL.  [name] clopidogrel (PLAVIX) 75 MG tablet Take 75 mg by mouth daily.  [name] glucose blood (ONETOUCH VERIO) test strip Use to check blood sugar daily  . metFORMIN (GLUCOPHAGE) 500 MG tablet Take 1 tablet (500 mg total) by mouth 3 (three) times daily.  . Multiple Vitamins-Minerals (MENS MULTIVITAMIN PLUS) TABS Take 1 tablet by mouth at bedtime.   . nitroGLYCERIN (NITROSTAT) 0.4 MG SL tablet Place 1 tablet under tongue every 5 mins. DO NOT USE MORE THAN 3 TABS  . rosuvastatin (CRESTOR) 40 MG tablet TAKE 1 TABLET BY MOUTH DAILY. PLEASE MAKE OVERDUE YEARLY APPT WITH DR. [name] BEFORE ANYMORE REFILLS (Patient taking differently: Take 40 mg by mouth daily. )    12 system ROS was negative unless otherwise noted in HPI  Observations/Objective: There were no vitals filed for this visit. Patient was awake alert and oriented.  He was able to answer questions fluently.  Left lower extremity was evaluated via phone and was noted to be significantly edematous relative to the left   I independently interpreted his lower extremity duplex and ABIs demonstrated a 50 to 70% stenosis in the graft without any significant decrease in velocities to suggest impending  failure.  ABIs were bilaterally 1.1.   Assessment and Plan: 73 year old male status post left femoropopliteal bypass with vein.  He has significant swelling still 3 months out from his bypass.  He is walking without limitation.  I recommended gentle compression stockings prior to getting out of bed.  Patient continues to walk and work daily.  He will elevate his legs when recumbent.  I will see him back in a month or 2 most likely via virtual visit to evaluate for improvement.  I discussed the assessment and treatment plan with the patient. The patient was provided an opportunity to ask questions and all were answered. The patient agreed with the plan and demonstrated an understanding of the instructions.   The patient was advised to call back or seek an in-person evaluation if the symptoms worsen or if the condition fails to improve as anticipated.  I spent less than 10 minutes minutes with the patient via video encounter.   Signed, Servando Snare [facility] [place] Office: [phone]  03/24/2019, 12:17 PM

## 2019-04-21 ENCOUNTER — Other Ambulatory Visit: Payer: Self-pay | Admitting: *Deleted

## 2019-04-21 NOTE — Patient Outreach (Signed)
[facility]) Care Management  04/21/2019  Harold Waters 12-27-1945 [id number]   Case Closure/Transition to [facility] Health/CCI  Referral Date:12/28/2018 Referral Source:TOC [facility] Telephonic Screening Reason for Referral:Disease Management Education Insurance:Health Team Advantage   Outreach:  Patient case has been transitioned to [facility] Health/CCI for further Care Management assistance.  Plan: RN Health Coach will close case at this time. RN Health Coach will send primary care provider Care Management Case Closure Letter. RN Health Coach will make patient inactive with [facility] Care Management at this time.  [place] [phone] [email or web address]

## 2019-04-28 ENCOUNTER — Encounter: Payer: Self-pay | Admitting: Vascular Surgery

## 2019-04-28 ENCOUNTER — Other Ambulatory Visit: Payer: Self-pay

## 2019-04-28 ENCOUNTER — Ambulatory Visit (INDEPENDENT_AMBULATORY_CARE_PROVIDER_SITE_OTHER): Payer: PPO | Admitting: Vascular Surgery

## 2019-04-28 DIAGNOSIS — I739 Peripheral vascular disease, unspecified: Secondary | ICD-10-CM

## 2019-04-28 NOTE — Progress Notes (Signed)
Virtual Visit via Video Note   I connected with Harold Waters on 04/28/2019 using the [employer] virtual platform and verified that I was speaking with the correct person using two identifiers. Patient was located at home with a male family member and I am located in the office.   The limitations of evaluation and management by telemedicine and the availability of in person appointments have been previously discussed with the patient and are documented in the patients chart. The patient expressed understanding and consented to proceed.   PCP: Plotnikov, [name], MD  Chief Complaint: Left lower extremity swelling  History of Present Illness: Harold Waters is a 73 y.o. male with status post left femoropopliteal bypass for claudication.  The symptoms have resolved.  Does have swelling that occurs after he walks.  He is back to work overall doing very well.  He has bilateral knee pain.  He is taking aspirin and Plavix.  Risk factors for vascular disease includes diabetes hyper cholesterolemia.  Currently other than leg swelling not having any other issues.  Past Medical History:  Diagnosis Date  . Angina   . Chronic stomach ulcer    "get them off and on" (09/19/2018)  . Coronary atherosclerosis of unspecified type of vessel, native or graft   . Diverticulosis   . DVT of lower extremity (deep venous thrombosis) ([place]) ~ 2010   LLE  . Erosive gastritis   . Essential hypertension, benign   . GERD (gastroesophageal reflux disease)   . Headache(784.0) 02/18/12   "lately"  . History of kidney stones   . Internal hemorrhoids   . Lower back pain   . Myocardial infarction ([place]) 1997  . Osteoarthritis   . Other and unspecified hyperlipidemia   . PAD (peripheral artery disease) ([facility])    with ABI's 0.8 on the right and 0.86 on the left  . Pneumonia 1957  . PSVT (paroxysmal supraventricular tachycardia) ([place]) 02/18/12  . Shortness of breath    "lying down"  . Sleep apnea    does not wear  c-pap; pt does not recall this hx on 09/19/2018    Past Surgical History:  Procedure Laterality Date  . ARTHROSCOPY KNEE W/ DRILLING Right ~ 2001  . BYPASS GRAFT Left 12/05/2018   femoral popliteal   . CARDIAC CATHETERIZATION  09/19/2018  . COLONOSCOPY    . CORONARY ANGIOPLASTY WITH STENT PLACEMENT  1997   "2"  . FEMORAL-POPLITEAL BYPASS GRAFT Left 12/06/2018   Procedure: BYPASS GRAFT FEMORAL-POPLITEAL ARTERY;  Surgeon: Waynetta Sandy, MD;  Location: [facility];  Service: Vascular;  Laterality: Left;  . ILIAC ARTERY STENT  04/22/2017   . Placement of a 6 mm x 100 mm Viabahn covered stent left SFA  . INTRAVASCULAR PRESSURE WIRE/FFR STUDY  09/19/2018  . INTRAVASCULAR PRESSURE WIRE/FFR STUDY N/A 09/19/2018   Procedure: INTRAVASCULAR PRESSURE WIRE/FFR STUDY;  Surgeon: [name], MD;  Location: [place] [facility];  Service: Cardiovascular;  Laterality: N/A;  . LEFT HEART CATH AND CORONARY ANGIOGRAPHY N/A 09/19/2018   Procedure: LEFT HEART CATH AND CORONARY ANGIOGRAPHY;  Surgeon: [name], MD;  Location: [facility];  Service: Cardiovascular;  Laterality: N/A;  . LEFT HEART CATHETERIZATION WITH CORONARY ANGIOGRAM N/A 02/19/2012   Procedure: LEFT HEART CATHETERIZATION WITH CORONARY ANGIOGRAM;  Surgeon: Larey Dresser, MD;  Location: [facility];  Service: Cardiovascular;  Laterality: N/A;  . LOWER EXTREMITY ANGIOGRAPHY Bilateral 04/22/2017   Procedure: Lower Extremity Angiography;  Surgeon: [name]  J, MD;  Location: [place] [facility];  Service: Cardiovascular;  Laterality: Bilateral;  . LOWER EXTREMITY ANGIOGRAPHY Bilateral 09/19/2018  . LOWER EXTREMITY ANGIOGRAPHY Bilateral 09/19/2018   Procedure: LOWER EXTREMITY ANGIOGRAPHY;  Surgeon: [name], MD;  Location: [place] [facility];  Service: Cardiovascular;  Laterality: Bilateral;  . PERIPHERAL ARTERIAL STENT GRAFT  09/2010   LLE  . PERIPHERAL VASCULAR ATHERECTOMY Left 04/22/2017   Procedure:  Peripheral Vascular Atherectomy;  Surgeon: [name], MD;  Location: [place] [facility];  Service: Cardiovascular;  Laterality: Left;  SFA  . PERIPHERAL VASCULAR INTERVENTION Left 04/22/2017   Procedure: Peripheral Vascular Intervention;  Surgeon: [name], MD;  Location: [place] [facility];  Service: Cardiovascular;  Laterality: Left;  SFA  . UPPER GASTROINTESTINAL ENDOSCOPY      Current Meds  Medication Sig  . aspirin EC 81 MG tablet Take 81 mg by mouth daily.  . Blood Glucose Monitoring Suppl (ONETOUCH VERIO) w/Device KIT 1 Units by Does not apply route daily as needed.  . carvedilol (COREG) 3.125 MG tablet TAKE 1 TABLET BY MOUTH 2 (TWO) TIMES DAILY WITH A MEAL.  [name] clopidogrel (PLAVIX) 75 MG tablet Take 75 mg by mouth daily.  [name] glucose blood (ONETOUCH VERIO) test strip Use to check blood sugar daily  . metFORMIN (GLUCOPHAGE) 500 MG tablet Take 1 tablet (500 mg total) by mouth 3 (three) times daily.  . Multiple Vitamins-Minerals (MENS MULTIVITAMIN PLUS) TABS Take 1 tablet by mouth at bedtime.   . nitroGLYCERIN (NITROSTAT) 0.4 MG SL tablet Place 1 tablet under tongue every 5 mins. DO NOT USE MORE THAN 3 TABS  . rosuvastatin (CRESTOR) 40 MG tablet TAKE 1 TABLET BY MOUTH DAILY. PLEASE MAKE OVERDUE YEARLY APPT WITH DR. [name] BEFORE ANYMORE REFILLS (Patient taking differently: Take 40 mg by mouth daily. )    12 system ROS was negative unless otherwise noted in HPI  Observations/Objective: He is awake alert and oriented Patient appears very healthy sitting at home with a male family member  I reviewed his recent left lower extremity graft duplex which demonstrates triphasic waveforms to the proximal graft where becomes monophasic distally is biphasic.  Lowest velocity is 59 cm/s highest velocity is 181 cm/s to suggest possible 50 to 70% stenosis in the proximal graft.  ABIs are 1 bilaterally.   Assessment and Plan: 73 year old male status post left femoropopliteal bypass  for lifelong and claudication.  He is back to work.  Does have swelling after walking and being on his feet for multiple hours.  He cannot wear tight compression I recommended gentle her compression.  We will continue aspirin Plavix.  I will have him follow-up in 3 to 4 months with repeat duplex and ABIs to make sure that are stenosis proximally the graft is not progressing.  I would also like to see him personally however if there are continued COVID concerns this could be done virtually.  Follow Up Instructions:  Follow up 3-4 months   I discussed the assessment and treatment plan with the patient. The patient was provided an opportunity to ask questions and all were answered. The patient agreed with the plan and demonstrated an understanding of the instructions.   The patient was advised to call back or seek an in-person evaluation if the symptoms worsen or if the condition fails to improve as anticipated.  I spent 11 minutes with the patient via video encounter.   Signed, Servando Snare [facility] Office:  [phone]  04/28/2019, 11:08 AM

## 2019-05-10 ENCOUNTER — Ambulatory Visit: Payer: PPO | Admitting: *Deleted

## 2019-06-08 ENCOUNTER — Telehealth: Payer: Self-pay | Admitting: Internal Medicine

## 2019-06-08 NOTE — Telephone Encounter (Signed)
pls sch OV w/me Thx

## 2019-06-08 NOTE — Telephone Encounter (Signed)
Copied from [place] [id number]. Topic: Referral - Request for Referral >> Jun 08, 2019 12:29 PM Harold Waters wrote: Has patient seen PCP for this complaint? No. *If NO, is insurance requiring patient see PCP for this issue before PCP can refer them? 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

## 2019-06-09 NOTE — Telephone Encounter (Signed)
LVM for patient to call back to schedule

## 2019-06-21 DIAGNOSIS — H25813 Combined forms of age-related cataract, bilateral: Secondary | ICD-10-CM | POA: Diagnosis not present

## 2019-07-03 ENCOUNTER — Ambulatory Visit (INDEPENDENT_AMBULATORY_CARE_PROVIDER_SITE_OTHER): Payer: PPO | Admitting: Internal Medicine

## 2019-07-03 ENCOUNTER — Other Ambulatory Visit: Payer: Self-pay

## 2019-07-03 ENCOUNTER — Other Ambulatory Visit (INDEPENDENT_AMBULATORY_CARE_PROVIDER_SITE_OTHER): Payer: PPO

## 2019-07-03 ENCOUNTER — Encounter: Payer: Self-pay | Admitting: Internal Medicine

## 2019-07-03 VITALS — BP 140/80 | HR 65 | Temp 97.7°F | Ht 65.0 in | Wt 172.0 lb

## 2019-07-03 DIAGNOSIS — E118 Type 2 diabetes mellitus with unspecified complications: Secondary | ICD-10-CM

## 2019-07-03 DIAGNOSIS — R202 Paresthesia of skin: Secondary | ICD-10-CM

## 2019-07-03 DIAGNOSIS — R413 Other amnesia: Secondary | ICD-10-CM

## 2019-07-03 DIAGNOSIS — R29818 Other symptoms and signs involving the nervous system: Secondary | ICD-10-CM

## 2019-07-03 DIAGNOSIS — G4700 Insomnia, unspecified: Secondary | ICD-10-CM | POA: Insufficient documentation

## 2019-07-03 DIAGNOSIS — F5101 Primary insomnia: Secondary | ICD-10-CM

## 2019-07-03 DIAGNOSIS — E559 Vitamin D deficiency, unspecified: Secondary | ICD-10-CM | POA: Diagnosis not present

## 2019-07-03 DIAGNOSIS — I10 Essential (primary) hypertension: Secondary | ICD-10-CM

## 2019-07-03 HISTORY — DX: Insomnia, unspecified: G47.00

## 2019-07-03 LAB — CBC WITH DIFFERENTIAL/PLATELET
Basophils Absolute: 0.1 10*3/uL (ref 0.0–0.1)
Basophils Relative: 0.9 % (ref 0.0–3.0)
Eosinophils Absolute: 0.7 10*3/uL (ref 0.0–0.7)
Eosinophils Relative: 11.3 % — ABNORMAL HIGH (ref 0.0–5.0)
HCT: 41.8 % (ref 39.0–52.0)
Hemoglobin: 13.8 g/dL (ref 13.0–17.0)
Lymphocytes Relative: 31.7 % (ref 12.0–46.0)
Lymphs Abs: 2 10*3/uL (ref 0.7–4.0)
MCHC: 33 g/dL (ref 30.0–36.0)
MCV: 87.3 fl (ref 78.0–100.0)
Monocytes Absolute: 0.5 10*3/uL (ref 0.1–1.0)
Monocytes Relative: 7.5 % (ref 3.0–12.0)
Neutro Abs: 3 10*3/uL (ref 1.4–7.7)
Neutrophils Relative %: 48.6 % (ref 43.0–77.0)
Platelets: 239 10*3/uL (ref 150.0–400.0)
RBC: 4.79 Mil/uL (ref 4.22–5.81)
RDW: 15.3 % (ref 11.5–15.5)
WBC: 6.2 10*3/uL (ref 4.0–10.5)

## 2019-07-03 LAB — BASIC METABOLIC PANEL
BUN: 21 mg/dL (ref 6–23)
CO2: 26 mEq/L (ref 19–32)
Calcium: 9.5 mg/dL (ref 8.4–10.5)
Chloride: 104 mEq/L (ref 96–112)
Creatinine, Ser: 1.1 mg/dL (ref 0.40–1.50)
GFR: 65.51 mL/min (ref >60.00)
Glucose, Bld: 123 mg/dL — ABNORMAL HIGH (ref 70–99)
Potassium: 4.3 mEq/L (ref 3.5–5.1)
Sodium: 138 mEq/L (ref 135–145)

## 2019-07-03 LAB — VITAMIN D 25 HYDROXY (VIT D DEFICIENCY, FRACTURES): VITD: 38.96 ng/mL (ref 30.00–100.00)

## 2019-07-03 LAB — TSH: TSH: 4.91 u[IU]/mL — ABNORMAL HIGH (ref 0.35–4.50)

## 2019-07-03 LAB — VITAMIN B12: Vitamin B-12: 661 pg/mL (ref 211–911)

## 2019-07-03 MED ORDER — TRAZODONE HCL 50 MG PO TABS: 25.0000 mg | ORAL_TABLET | Freq: Every evening | ORAL | 1 refills | Status: DC | PRN

## 2019-07-03 MED ORDER — VITAMIN D3 50 MCG (2000 UT) PO CAPS: 2000.0000 [IU] | ORAL_CAPSULE | Freq: Every day | ORAL | 3 refills | Status: DC

## 2019-07-03 NOTE — Assessment & Plan Note (Signed)
Metformin 

## 2019-07-03 NOTE — Assessment & Plan Note (Signed)
Worse Try Trazodone - low dose

## 2019-07-03 NOTE — Assessment & Plan Note (Signed)
memory loss after arterial bypass surgery - L femoral to [place] popliteal artery bypass with vein by Dr. Donzetta Matters 12/06/18. Treat insomnia Labs Head CT Neurol ref

## 2019-07-03 NOTE — Assessment & Plan Note (Signed)
Coreg

## 2019-07-03 NOTE — Progress Notes (Signed)
Subjective:  Patient ID: Harold Waters, male    DOB: 1946-07-10  Age: 73 y.o. MRN: [id number]  CC: No chief complaint on file.   HPI Bradley Bostelman Rule presents for memory loss after arterial bypass surgery - L femoral to [place] popliteal artery bypass with vein by Dr. Donzetta Matters 12/06/18. F/u CAD, dyslipidemia C/o insomnia --- 3-4 h/night  Outpatient Medications Prior to Visit  Medication Sig Dispense Refill   aspirin EC 81 MG tablet Take 81 mg by mouth daily.     Blood Glucose Monitoring Suppl (ONETOUCH VERIO) w/Device KIT 1 Units by Does not apply route daily as needed. 1 kit 1   carvedilol (COREG) 3.125 MG tablet TAKE 1 TABLET BY MOUTH 2 (TWO) TIMES DAILY WITH A MEAL. 180 tablet 3   clopidogrel (PLAVIX) 75 MG tablet Take 75 mg by mouth daily.     glucose blood (ONETOUCH VERIO) test strip Use to check blood sugar daily 50 each 11   metFORMIN (GLUCOPHAGE) 500 MG tablet Take 1 tablet (500 mg total) by mouth 3 (three) times daily. 90 tablet 11   Multiple Vitamins-Minerals (MENS MULTIVITAMIN PLUS) TABS Take 1 tablet by mouth at bedtime.      nitroGLYCERIN (NITROSTAT) 0.4 MG SL tablet Place 1 tablet under tongue every 5 mins. DO NOT USE MORE THAN 3 TABS 25 tablet 1   rosuvastatin (CRESTOR) 40 MG tablet TAKE 1 TABLET BY MOUTH DAILY. PLEASE MAKE OVERDUE YEARLY APPT WITH DR. [name] BEFORE ANYMORE REFILLS (Patient taking differently: Take 40 mg by mouth daily. ) 90 tablet 3   isosorbide mononitrate (IMDUR) 60 MG 24 hr tablet Take 1 tablet (60 mg total) by mouth daily. 90 tablet 3   No facility-administered medications prior to visit.     ROS: Review of Systems  Constitutional: Negative for appetite change, fatigue and unexpected weight change.  HENT: Negative for congestion, nosebleeds, sneezing, sore throat and trouble swallowing.   Eyes: Negative for itching and visual disturbance.  Respiratory: Negative for cough.   Cardiovascular: Negative for chest pain, palpitations and leg swelling.    Gastrointestinal: Negative for abdominal distention, blood in stool, diarrhea and nausea.  Genitourinary: Negative for frequency and hematuria.  Musculoskeletal: Negative for back pain, gait problem, joint swelling and neck pain.  Skin: Negative for rash.  Neurological: Negative for dizziness, tremors, speech difficulty and weakness.  Psychiatric/Behavioral: Positive for decreased concentration and sleep disturbance. Negative for agitation and dysphoric mood. The patient is not nervous/anxious.     Objective:  BP 140/80 (BP Location: Left Arm, Patient Position: Sitting, Cuff Size: Normal)    Pulse 65    Temp 97.7 F (36.5 C) (Oral)    Ht 5' 5"  (1.651 m)    Wt 172 lb (78 kg)    SpO2 95%    BMI 28.62 kg/m   BP Readings from Last 3 Encounters:  07/03/19 140/80  02/07/19 131/76  12/28/18 128/72    Wt Readings from Last 3 Encounters:  07/03/19 172 lb (78 kg)  02/07/19 171 lb (77.6 kg)  12/28/18 169 lb (76.7 kg)    Physical Exam Constitutional:      General: He is not in acute distress.    Appearance: He is well-developed.     Comments: NAD  Eyes:     Conjunctiva/sclera: Conjunctivae normal.     Pupils: Pupils are equal, round, and reactive to light.  Neck:     Musculoskeletal: Normal range of motion.     Thyroid: No thyromegaly.  Vascular: No JVD.  Cardiovascular:     Rate and Rhythm: Normal rate and regular rhythm.     Heart sounds: Normal heart sounds. No murmur. No friction rub. No gallop.   Pulmonary:     Effort: Pulmonary effort is normal. No respiratory distress.     Breath sounds: Normal breath sounds. No wheezing or rales.  Chest:     Chest wall: No tenderness.  Abdominal:     General: Bowel sounds are normal. There is no distension.     Palpations: Abdomen is soft. There is no mass.     Tenderness: There is no abdominal tenderness. There is no guarding or rebound.  Musculoskeletal: Normal range of motion.        General: No tenderness.  Lymphadenopathy:      Cervical: No cervical adenopathy.  Skin:    General: Skin is warm and dry.     Findings: No rash.  Neurological:     Mental Status: He is alert and oriented to person, place, and time.     Cranial Nerves: No cranial nerve deficit.     Motor: No abnormal muscle tone.     Coordination: Coordination normal.     Gait: Gait normal.     Deep Tendon Reflexes: Reflexes are normal and symmetric.  Psychiatric:        Behavior: Behavior normal.        Thought Content: Thought content normal.        Judgment: Judgment normal.     Lab Results  Component Value Date   WBC 4.7 12/29/2018   HGB 11.4 (L) 12/29/2018   HCT 33.5 (L) 12/29/2018   PLT 381.0 12/29/2018   GLUCOSE 165 (H) 12/29/2018   CHOL 156 05/03/2017   TRIG 150 (H) 05/03/2017   HDL 46 05/03/2017   LDLDIRECT 113.2 08/19/2011   LDLCALC 80 05/03/2017   ALT 33 11/28/2018   AST 20 11/28/2018   NA 141 12/29/2018   K 4.4 12/29/2018   CL 104 12/29/2018   CREATININE 1.18 12/29/2018   BUN 18 12/29/2018   CO2 30 12/29/2018   TSH 4.57 (H) 12/24/2014   PSA 3.69 12/24/2014   INR 0.99 11/28/2018   HGBA1C 8.2 (H) 12/29/2018    Vas [place] Burnard Bunting With/wo Tbi  Result Date: 03/24/2019 LOWER EXTREMITY DOPPLER STUDY Indications: Peripheral artery disease, and Pain at the surgical site. High Risk Factors: Hypertension, hyperlipidemia, Diabetes, past history of                    smoking, prior MI.  Vascular Interventions: Left fem-pop bypass graft 12/06/2018. Comparison Study: Prior duplex 02/07/2019 showed an elevated velocity in the                   proximal thigh segment of the graft consistent with 50-70%                   stenosis. Performing Technologist: Delorise Shiner RVT  Examination Guidelines: A complete evaluation includes at minimum, Doppler waveform signals and systolic blood pressure reading at the level of bilateral brachial, anterior tibial, and posterior tibial arteries, when vessel segments are accessible. Bilateral testing is  considered an integral part of a complete examination. Photoelectric Plethysmograph (PPG) waveforms and toe systolic pressure readings are included as required and additional duplex testing as needed. Limited examinations for reoccurring indications may be performed as noted.  ABI Findings: +---------+------------------+-----+---------+--------+  Right     Rt Pressure (mmHg) Index Waveform  Comment   +---------+------------------+-----+---------+--------+  Brachial  121                                          +---------+------------------+-----+---------+--------+  ATA       139                1.10  triphasic           +---------+------------------+-----+---------+--------+  PTA       143                1.13  triphasic           +---------+------------------+-----+---------+--------+  Great Toe 69                 0.55                      +---------+------------------+-----+---------+--------+ +---------+------------------+-----+----------+-------+  Left      Lt Pressure (mmHg) Index Waveform   Comment  +---------+------------------+-----+----------+-------+  Brachial  126                                          +---------+------------------+-----+----------+-------+  ATA       139                1.10  monophasic          +---------+------------------+-----+----------+-------+  PTA       148                1.17  triphasic           +---------+------------------+-----+----------+-------+  Great Toe 77                 0.61                      +---------+------------------+-----+----------+-------+ +-------+-----------+-----------+------------+------------+  ABI/TBI Today's ABI Today's TBI Previous ABI Previous TBI  +-------+-----------+-----------+------------+------------+  Right   1.13        0.55        1.24         0.87          +-------+-----------+-----------+------------+------------+  Left    1.17        0.61        1.19         0.76          +-------+-----------+-----------+------------+------------+  Bilateral ABIs appear essentially unchanged compared to prior study on 02/07/2019.  Summary: Right: Resting right ankle-brachial index is within normal range. No evidence of significant right lower extremity arterial disease. The right toe-brachial index is abnormal. RT great toe pressure = 69 mmHg. Left: Resting left ankle-brachial index is within normal range. No evidence of significant left lower extremity arterial disease. The left toe-brachial index is abnormal. LT Great toe pressure = 77 mmHg.  *See table(s) above for measurements and observations.  Electronically signed by Servando Snare MD on 03/24/2019 at 10:53:26 AM.    Final    Vas [place] Lower Extremity Bypass Graft Dupl  Result Date: 03/24/2019 LOWER EXTREMITY ARTERIAL DUPLEX STUDY Indications: Peripheral artery disease, and Patient reports sharp pain at              surgical site and swelling in left lower extremity. High Risk Factors: Hypertension, hyperlipidemia,  Diabetes, past history of                    smoking, coronary artery disease.  Vascular Interventions: 12/06/2018: Left common femoral to above-knee popliteal                         artery bypass with ipsilateral reversed greater                         saphenous vein. Current ABI:            Right: 1.13 Left: 1.17 Performing Technologist: Delorise Shiner RVT  Examination Guidelines: A complete evaluation includes B-mode imaging, spectral Doppler, color Doppler, and power Doppler as needed of all accessible portions of each vessel. Bilateral testing is considered an integral part of a complete examination. Limited examinations for reoccurring indications may be performed as noted.  +----------+--------+-----+--------+----------+--------+  LEFT       PSV cm/s Ratio Stenosis Waveform   Comments  +----------+--------+-----+--------+----------+--------+  CFA Distal 132                     triphasic            +----------+--------+-----+--------+----------+--------+  ATA Distal 48                       biphasic             +----------+--------+-----+--------+----------+--------+  PTA Distal 75                      monophasic           +----------+--------+-----+--------+----------+--------+  Left Graft #1: Fem-pop +--------------------+--------+--------+----------+--------+                       PSV cm/s Stenosis Waveform   Comments  +--------------------+--------+--------+----------+--------+  Inflow               133               triphasic            +--------------------+--------+--------+----------+--------+  Proximal Anastomosis 181               triphasic            +--------------------+--------+--------+----------+--------+  Proximal Graft       76                monophasic           +--------------------+--------+--------+----------+--------+  Mid Graft            59                monophasic           +--------------------+--------+--------+----------+--------+  Distal Graft         59                biphasic             +--------------------+--------+--------+----------+--------+  Distal Anastasis     110               biphasic             +--------------------+--------+--------+----------+--------+  Outflow              97                biphasic             +--------------------+--------+--------+----------+--------+  Summary: Left: No significant change as compared to previous study. Left Graft(s): Fem-pop bypass graft visualized with a 50-70% stenosis noted. Proximal thigh segment velocities increase from 76 cm/sec to 149 cm/sec consistent with 50-70% stenosis.  See table(s) above for measurements and observations. Electronically signed by Servando Snare MD on 03/24/2019 at 2:18:37 PM.    Final     Assessment & Plan:   There are no diagnoses linked to this encounter.   No orders of the defined types were placed in this encounter.    Follow-up: No follow-ups on file.  Walker Kehr, MD

## 2019-07-04 ENCOUNTER — Other Ambulatory Visit (INDEPENDENT_AMBULATORY_CARE_PROVIDER_SITE_OTHER): Payer: PPO

## 2019-07-04 ENCOUNTER — Encounter: Payer: Self-pay | Admitting: Internal Medicine

## 2019-07-04 DIAGNOSIS — R7989 Other specified abnormal findings of blood chemistry: Secondary | ICD-10-CM | POA: Diagnosis not present

## 2019-07-04 LAB — T4, FREE: Free T4: 0.77 ng/dL (ref 0.60–1.60)

## 2019-07-06 ENCOUNTER — Other Ambulatory Visit: Payer: Self-pay | Admitting: Internal Medicine

## 2019-07-06 ENCOUNTER — Encounter: Payer: Self-pay | Admitting: Neurology

## 2019-07-06 DIAGNOSIS — R413 Other amnesia: Secondary | ICD-10-CM

## 2019-07-27 ENCOUNTER — Ambulatory Visit (INDEPENDENT_AMBULATORY_CARE_PROVIDER_SITE_OTHER)
Admission: RE | Admit: 2019-07-27 | Discharge: 2019-07-27 | Disposition: A | Discharge status: Home / Self Care | Payer: PPO | Source: Ambulatory Visit | Attending: Internal Medicine | Admitting: Internal Medicine

## 2019-07-27 ENCOUNTER — Other Ambulatory Visit: Payer: Self-pay

## 2019-07-27 DIAGNOSIS — I6782 Cerebral ischemia: Secondary | ICD-10-CM | POA: Diagnosis not present

## 2019-07-27 DIAGNOSIS — R413 Other amnesia: Secondary | ICD-10-CM | POA: Diagnosis not present

## 2019-07-27 DIAGNOSIS — R29818 Other symptoms and signs involving the nervous system: Secondary | ICD-10-CM | POA: Diagnosis not present

## 2019-07-27 DIAGNOSIS — R9082 White matter disease, unspecified: Secondary | ICD-10-CM | POA: Diagnosis not present

## 2019-07-27 DIAGNOSIS — I639 Cerebral infarction, unspecified: Secondary | ICD-10-CM | POA: Diagnosis not present

## 2019-08-02 ENCOUNTER — Other Ambulatory Visit: Payer: Self-pay

## 2019-08-02 DIAGNOSIS — I739 Peripheral vascular disease, unspecified: Secondary | ICD-10-CM

## 2019-08-04 ENCOUNTER — Ambulatory Visit (HOSPITAL_COMMUNITY)
Admission: RE | Admit: 2019-08-04 | Discharge: 2019-08-04 | Disposition: A | Discharge status: Home / Self Care | Payer: PPO | Source: Ambulatory Visit | Attending: Family | Admitting: Family

## 2019-08-04 ENCOUNTER — Ambulatory Visit (INDEPENDENT_AMBULATORY_CARE_PROVIDER_SITE_OTHER)
Admission: RE | Admit: 2019-08-04 | Discharge: 2019-08-04 | Disposition: A | Discharge status: Home / Self Care | Payer: PPO | Source: Ambulatory Visit | Attending: Family | Admitting: Family

## 2019-08-04 ENCOUNTER — Ambulatory Visit (INDEPENDENT_AMBULATORY_CARE_PROVIDER_SITE_OTHER): Payer: PPO | Admitting: Vascular Surgery

## 2019-08-04 ENCOUNTER — Other Ambulatory Visit: Payer: Self-pay

## 2019-08-04 ENCOUNTER — Encounter: Payer: Self-pay | Admitting: Vascular Surgery

## 2019-08-04 VITALS — BP 119/72 | HR 55 | Temp 97.4°F | Resp 20 | Ht 65.0 in | Wt 171.0 lb

## 2019-08-04 DIAGNOSIS — I739 Peripheral vascular disease, unspecified: Secondary | ICD-10-CM | POA: Diagnosis not present

## 2019-08-04 NOTE — Progress Notes (Signed)
Patient ID: Harold Waters, male   DOB: 1946-04-08, 73 y.o.   MRN: [id number]  Reason for Consult: Follow-up   Referred by Plotnikov, [name], MD  Subjective:     HPI:  Harold Waters is a 73 y.o. male history of left femoropopliteal bypass with graft for short distance claudication.  He is back to work this time.  Postoperatively had swelling this is improving.  Does not have any left lower extremity complaints at this time.  States he has remained active.  He is on aspirin, Plavix and Crestor.  Past Medical History:  Diagnosis Date  . Angina   . Chronic stomach ulcer    "get them off and on" (09/19/2018)  . Coronary atherosclerosis of unspecified type of vessel, native or graft   . Diverticulosis   . DVT of lower extremity (deep venous thrombosis) ([place]) ~ 2010   LLE  . Erosive gastritis   . Essential hypertension, benign   . GERD (gastroesophageal reflux disease)   . Headache(784.0) 02/18/12   "lately"  . History of kidney stones   . Internal hemorrhoids   . Lower back pain   . Myocardial infarction ([place]) 1997  . Osteoarthritis   . Other and unspecified hyperlipidemia   . PAD (peripheral artery disease) (HCC)    with ABI's 0.8 on the right and 0.86 on the left  . Pneumonia 1957  . PSVT (paroxysmal supraventricular tachycardia) ([place]) 02/18/12  . Shortness of breath    "lying down"  . Sleep apnea    does not wear c-pap; pt does not recall this hx on 09/19/2018   Family History  Problem Relation Age of Onset  . Ulcers Father        had stomach issues, not sure what happened  . Coronary artery disease Brother        male 1st degree relative <50  . Colon cancer Neg Hx   . Esophageal cancer Neg Hx   . Rectal cancer Neg Hx   . Stomach cancer Neg Hx    Past Surgical History:  Procedure Laterality Date  . ARTHROSCOPY KNEE W/ DRILLING Right ~ 2001  . BYPASS GRAFT Left 12/05/2018   femoral popliteal   . CARDIAC CATHETERIZATION  09/19/2018  . COLONOSCOPY    .  CORONARY ANGIOPLASTY WITH STENT PLACEMENT  1997   "2"  . FEMORAL-POPLITEAL BYPASS GRAFT Left 12/06/2018   Procedure: BYPASS GRAFT FEMORAL-POPLITEAL ARTERY;  Surgeon: Waynetta Sandy, MD;  Location: [facility];  Service: Vascular;  Laterality: Left;  . ILIAC ARTERY STENT  04/22/2017   . Placement of a 6 mm x 100 mm Viabahn covered stent left SFA  . INTRAVASCULAR PRESSURE WIRE/FFR STUDY  09/19/2018  . INTRAVASCULAR PRESSURE WIRE/FFR STUDY N/A 09/19/2018   Procedure: INTRAVASCULAR PRESSURE WIRE/FFR STUDY;  Surgeon: [name], MD;  Location: [facility];  Service: Cardiovascular;  Laterality: N/A;  . LEFT HEART CATH AND CORONARY ANGIOGRAPHY N/A 09/19/2018   Procedure: LEFT HEART CATH AND CORONARY ANGIOGRAPHY;  Surgeon: [name], MD;  Location: [facility];  Service: Cardiovascular;  Laterality: N/A;  . LEFT HEART CATHETERIZATION WITH CORONARY ANGIOGRAM N/A 02/19/2012   Procedure: LEFT HEART CATHETERIZATION WITH CORONARY ANGIOGRAM;  Surgeon: Larey Dresser, MD;  Location[facility];  Service: Cardiovascular;  Laterality: N/A;  . LOWER EXTREMITY ANGIOGRAPHY Bilateral 04/22/2017   Procedure: Lower Extremity Angiography;  Surgeon: [name], MD;  Location: [facility];  Service: Cardiovascular;  Laterality: Bilateral;  . LOWER EXTREMITY ANGIOGRAPHY Bilateral 09/19/2018  . LOWER EXTREMITY ANGIOGRAPHY Bilateral 09/19/2018   Procedure: LOWER EXTREMITY ANGIOGRAPHY;  Surgeon: [name], MD;  Location: [facility];  Service: Cardiovascular;  Laterality: Bilateral;  . PERIPHERAL ARTERIAL STENT GRAFT  09/2010   LLE  . PERIPHERAL VASCULAR ATHERECTOMY Left 04/22/2017   Procedure: Peripheral Vascular Atherectomy;  Surgeon: [name], MD;  Location: [facility];  Service: Cardiovascular;  Laterality: Left;  SFA  . PERIPHERAL VASCULAR INTERVENTION Left 04/22/2017   Procedure: Peripheral Vascular Intervention;  Surgeon: [name],  MD;  Location: [facility];  Service: Cardiovascular;  Laterality: Left;  SFA  . UPPER GASTROINTESTINAL ENDOSCOPY      Short Social History:  Social History   Tobacco Use  . Smoking status: Former Smoker    Packs/day: 1.00    Years: 40.00    Pack years: 40.00    Types: Cigarettes    Quit date: 04/03/2010    Years since quitting: 9.3  . Smokeless tobacco: Never Used  Substance Use Topics  . Alcohol use: Not Currently    Comment: "used to drink when I was young; quit ~ 1980's"    Allergies  Allergen Reactions  . Eggs Or Egg-Derived Products Itching    Current Outpatient Medications  Medication Sig Dispense Refill  . aspirin EC 81 MG tablet Take 81 mg by mouth daily.    . Blood Glucose Monitoring Suppl (ONETOUCH VERIO) w/Device KIT 1 Units by Does not apply route daily as needed. 1 kit 1  . carvedilol (COREG) 3.125 MG tablet TAKE 1 TABLET BY MOUTH 2 (TWO) TIMES DAILY WITH A MEAL. 180 tablet 3  . Cholecalciferol (VITAMIN D3) 50 MCG (2000 UT) capsule Take 1 capsule (2,000 Units total) by mouth daily. 100 capsule 3  . clopidogrel (PLAVIX) 75 MG tablet Take 75 mg by mouth daily.    [name] glucose blood (ONETOUCH VERIO) test strip Use to check blood sugar daily 50 each 11  . metFORMIN (GLUCOPHAGE) 500 MG tablet Take 1 tablet (500 mg total) by mouth 3 (three) times daily. 90 tablet 11  . Multiple Vitamins-Minerals (MENS MULTIVITAMIN PLUS) TABS Take 1 tablet by mouth at bedtime.     . nitroGLYCERIN (NITROSTAT) 0.4 MG SL tablet Place 1 tablet under tongue every 5 mins. DO NOT USE MORE THAN 3 TABS 25 tablet 1  . rosuvastatin (CRESTOR) 40 MG tablet TAKE 1 TABLET BY MOUTH DAILY. PLEASE MAKE OVERDUE YEARLY APPT WITH DR. [name] BEFORE ANYMORE REFILLS (Patient taking differently: Take 40 mg by mouth daily. ) 90 tablet 3  . traZODone (DESYREL) 50 MG tablet Take 0.5-1 tablets (25-50 mg total) by mouth at bedtime as needed for sleep. 90 tablet 1  . isosorbide mononitrate (IMDUR) 60 MG 24 hr tablet  Take 1 tablet (60 mg total) by mouth daily. 90 tablet 3   No current facility-administered medications for this visit.     Review of Systems  Constitutional:  Constitutional negative. HENT: HENT negative.  Eyes: Eyes negative.  Respiratory: Respiratory negative.  Cardiovascular: Positive for leg swelling.  Musculoskeletal: Positive for leg pain.  Skin: Skin negative.  Neurological: Neurological negative. Hematologic: Hematologic/lymphatic negative.  Psychiatric: Psychiatric negative.        Objective:  Objective   Vitals:   08/04/19 1045  BP: 119/72  Pulse: (!) 55  Resp: 20  Temp: (!) 97.4 F (36.3 C)  SpO2: 95%  Weight: 171 lb (77.6 kg)  Height: 5' 5"  (1.651 m)   Body mass index is 28.46 kg/m.  Physical Exam HENT:     Head: Normocephalic.     Mouth/Throat:     Mouth: Mucous membranes are moist.  Eyes:     Pupils: Pupils are equal, round, and reactive to light.  Neck:     Musculoskeletal: Normal range of motion.  Cardiovascular:     Rate and Rhythm: Normal rate.     Pulses:          Femoral pulses are 2+ on the right side and 2+ on the left side.      Popliteal pulses are 2+ on the right side and 2+ on the left side.       Dorsalis pedis pulses are 2+ on the right side and 2+ on the left side.  Pulmonary:     Breath sounds: Normal breath sounds.  Abdominal:     General: Abdomen is flat.     Palpations: Abdomen is soft.  Musculoskeletal: Normal range of motion.     Right lower leg: No edema.     Left lower leg: Edema present.  Skin:    General: Skin is warm and dry.     Capillary Refill: Capillary refill takes less than 2 seconds.  Neurological:     General: No focal deficit present.     Mental Status: He is alert.  Psychiatric:        Thought Content: Thought content normal.     Data: I have independently interpreted his ABIs to be 1.1 bilaterally and triphasic  Of also independently interpreted his left femoropopliteal bypass graft mildly  elevated velocities.  Mid graft velocity 38 cm/s.     Assessment/Plan:     73 year old male status post left femoropopliteal bypass for claudication.  Vein does demonstrate some intragraft stenosis with downstream velocity 38 cm/s.  He is asymptomatic with palpable pedal pulse today.  We will plan to follow him up in short order.  Has persistent low velocities distally or worsen will certainly need angiography possible invention on the vein graft.  I discussed possible outcomes he demonstrates good understanding we will get him to follow-up in 3 months.     Waynetta Sandy MD [facility]

## 2019-08-14 ENCOUNTER — Ambulatory Visit: Payer: PPO | Admitting: Internal Medicine

## 2019-08-23 ENCOUNTER — Ambulatory Visit (INDEPENDENT_AMBULATORY_CARE_PROVIDER_SITE_OTHER): Payer: PPO | Admitting: Neurology

## 2019-08-23 ENCOUNTER — Encounter: Payer: Self-pay | Admitting: Neurology

## 2019-08-23 ENCOUNTER — Other Ambulatory Visit: Payer: Self-pay

## 2019-08-23 VITALS — BP 140/60 | HR 78 | Ht 65.0 in | Wt 172.0 lb

## 2019-08-23 DIAGNOSIS — R413 Other amnesia: Secondary | ICD-10-CM

## 2019-08-23 MED ORDER — TRAZODONE HCL 50 MG PO TABS: ORAL_TABLET | ORAL | 11 refills | Status: DC

## 2019-08-23 MED ORDER — DONEPEZIL HCL 10 MG PO TABS: ORAL_TABLET | ORAL | 11 refills | Status: DC

## 2019-08-23 NOTE — Patient Instructions (Addendum)
1. Schedule MRI brain without contrast  2. Start Donepezil 10mg : Take 1/2 tablet daily for 2 weeks, then increase to 1 tablet daily  3. Increase Trazodone 50mg : take 2 tablets every night  4. Follow-up in 6 months, call for any changes   RECOMMENDATIONS FOR ALL PATIENTS WITH MEMORY PROBLEMS: 1. Continue to exercise (Recommend 30 minutes of walking everyday, or 3 hours every week) 2. Increase social interactions - continue going to [place] and enjoy social gatherings with friends and family 3. Eat healthy, avoid fried foods and eat more fruits and vegetables 4. Maintain adequate blood pressure, blood sugar, and blood cholesterol level. Reducing the risk of stroke and cardiovascular disease also helps promoting better memory. 5. Avoid stressful situations. Live a simple life and avoid aggravations. Organize your time and prepare for the next day in anticipation. 6. Sleep well, avoid any interruptions of sleep and avoid any distractions in the bedroom that may interfere with adequate sleep quality 7. Avoid sugar, avoid sweets as there is a strong link between excessive sugar intake, diabetes, and cognitive impairment We discussed the Mediterranean diet, which has been shown to help patients reduce the risk of progressive memory disorders and reduces cardiovascular risk. This includes eating fish, eat fruits and green leafy vegetables, nuts like almonds and hazelnuts, walnuts, and also use olive oil. Avoid fast foods and fried foods as much as possible. Avoid sweets and sugar as sugar use has been linked to worsening of memory function.

## 2019-08-23 NOTE — Progress Notes (Signed)
NEUROLOGY CONSULTATION NOTE  Harold Waters MRN: [id number] DOB: 27-Aug-1946  Referring provider: Dr. Lew Dawes Primary care provider: Dr. Lew Dawes  Reason for consult:  Memory loss  Dear Dr Alain Marion:  Thank you for your kind referral of Harold Waters for consultation of the above symptoms. Although his history is well known to you, please allow me to reiterate it for the purpose of our medical record. He is alone in the office today. Records and images were personally reviewed where available.   HISTORY OF PRESENT ILLNESS: This is a pleasant 73 year old right-handed man with a history of hypertension, hyperlipidemia, PAD, presenting for evaluation of memory loss. He feels that memory changes started soon after his surgery in February 2020 where he had a left femoral to AK popliteal artery bypass under general anesthesia.  Prior to this, he had minor memory changes, but soon after surgery he noticed that he was unable to do things that he could do easily in the past such as math. He used to do large numbers in the back of his head, but has more difficulty now. He lives with his wife who constantly says something about his memory saying "you forget everything," which upsets him since this was not happening prior to the surgery. He denies getting lost driving but has missed his exit a few times because he is not focusing or forgets where he is going. He has forgotten bill payments and has had late charges (new as well). He has been forgetting his medications, his wife now helps him. He loses things constantly, his phone is lost all the time. He has put things for the pantry in the freezer. He denies leaving the stove on. He has word-finding difficulties and forgets names more. 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 He has also had a change in sleep  pattern, he has always had sleep difficulties getting an average of 4 hours of sleep, but since the surgery he would only get 2-3 hours of sleep. With Trazodone 1 tab qhs he gets 4 hours. He has daytime drowsiness and would drag his feet because he is so tired. Mood is good. No family history of dementia, no history of significant head injuries or alcohol use.   He gets dizzy when walking or changing clothes, no falls. He recalls an episode of double vision 3-4 years ago, this has resolved. He has occasional difficulty swallowing water. He has some numbness in both hands. He denies any headaches, focal numbness/tingling/weakness, bowel/bladder dysfunction, anosmia, or tremors. He denies any prior history of stroke, head CT done in 07/2019 showed an old left posterior parietal infarct and old left superior cerebellar lacunar infarcts, mild chronic microvascular disease, no acute change.  Laboratory Data: Lab Results  Component Value Date   TSH 4.91 (H) 07/03/2019   Free T4 0.77  Lab Results  Component Value Date   [id number] 07/03/2019      PAST MEDICAL HISTORY: Past Medical History:  Diagnosis Date  . Angina   . Chronic stomach ulcer    "get them off and on" (09/19/2018)  . Coronary atherosclerosis of unspecified type of vessel, native or graft   . Diverticulosis   . DVT of lower extremity (deep venous thrombosis) ([place]) ~ 2010   LLE  . Erosive gastritis   . Essential hypertension, benign   .  GERD (gastroesophageal reflux disease)   . Headache(784.0) 02/18/12   "lately"  . History of kidney stones   . Internal hemorrhoids   . Lower back pain   . Myocardial infarction ([place]) 1997  . Osteoarthritis   . Other and unspecified hyperlipidemia   . PAD (peripheral artery disease) (HCC)    with ABI's 0.8 on the right and 0.86 on the left  . Pneumonia 1957  . PSVT (paroxysmal supraventricular tachycardia) ([place]) 02/18/12  . Shortness of breath    "lying down"  . Sleep apnea    does  not wear c-pap; pt does not recall this hx on 09/19/2018    PAST SURGICAL HISTORY: Past Surgical History:  Procedure Laterality Date  . ARTHROSCOPY KNEE W/ DRILLING Right ~ 2001  . BYPASS GRAFT Left 12/05/2018   femoral popliteal   . CARDIAC CATHETERIZATION  09/19/2018  . COLONOSCOPY    . CORONARY ANGIOPLASTY WITH STENT PLACEMENT  1997   "2"  . FEMORAL-POPLITEAL BYPASS GRAFT Left 12/06/2018   Procedure: BYPASS GRAFT FEMORAL-POPLITEAL ARTERY;  Surgeon: Waynetta Sandy, MD;  Location: [facility];  Service: Vascular;  Laterality: Left;  . ILIAC ARTERY STENT  04/22/2017   . Placement of a 6 mm x 100 mm Viabahn covered stent left SFA  . INTRAVASCULAR PRESSURE WIRE/FFR STUDY  09/19/2018  . INTRAVASCULAR PRESSURE WIRE/FFR STUDY N/A 09/19/2018   Procedure: INTRAVASCULAR PRESSURE WIRE/FFR STUDY;  Surgeon: [name], MD;  Location: [facility];  Service: Cardiovascular;  Laterality: N/A;  . LEFT HEART CATH AND CORONARY ANGIOGRAPHY N/A 09/19/2018   Procedure: LEFT HEART CATH AND CORONARY ANGIOGRAPHY;  Surgeon: [name], MD;  Location: [facility];  Service: Cardiovascular;  Laterality: N/A;  . LEFT HEART CATHETERIZATION WITH CORONARY ANGIOGRAM N/A 02/19/2012   Procedure: LEFT HEART CATHETERIZATION WITH CORONARY ANGIOGRAM;  Surgeon: Larey Dresser, MD;  Location: [facility] LAB;  Service: Cardiovascular;  Laterality: N/A;  . LOWER EXTREMITY ANGIOGRAPHY Bilateral 04/22/2017   Procedure: Lower Extremity Angiography;  Surgeon: [name], MD;  Location: [facility];  Service: Cardiovascular;  Laterality: Bilateral;  . LOWER EXTREMITY ANGIOGRAPHY Bilateral 09/19/2018  . LOWER EXTREMITY ANGIOGRAPHY Bilateral 09/19/2018   Procedure: LOWER EXTREMITY ANGIOGRAPHY;  Surgeon: [name], MD;  Location: [facility];  Service: Cardiovascular;  Laterality: Bilateral;  . PERIPHERAL ARTERIAL STENT GRAFT  09/2010   LLE  . PERIPHERAL VASCULAR ATHERECTOMY  Left 04/22/2017   Procedure: Peripheral Vascular Atherectomy;  Surgeon: [name], MD;  Location: [facility];  Service: Cardiovascular;  Laterality: Left;  SFA  . PERIPHERAL VASCULAR INTERVENTION Left 04/22/2017   Procedure: Peripheral Vascular Intervention;  Surgeon: [name], MD;  Location: [facility];  Service: Cardiovascular;  Laterality: Left;  SFA  . UPPER GASTROINTESTINAL ENDOSCOPY      MEDICATIONS: Current Outpatient Medications on File Prior to Visit  Medication Sig Dispense Refill  . aspirin EC 81 MG tablet Take 81 mg by mouth daily.    . Blood Glucose Monitoring Suppl (ONETOUCH VERIO) w/Device KIT 1 Units by Does not apply route daily as needed. 1 kit 1  . carvedilol (COREG) 3.125 MG tablet TAKE 1 TABLET BY MOUTH 2 (TWO) TIMES DAILY WITH A MEAL. 180 tablet 3  . Cholecalciferol (VITAMIN D3) 50 MCG (2000 UT) capsule Take 1 capsule (2,000 Units total) by mouth daily. 100 capsule 3  . clopidogrel (PLAVIX) 75 MG tablet Take 75 mg by mouth daily.    [name]  glucose blood (ONETOUCH VERIO) test strip Use to check blood sugar daily 50 each 11  . Multiple Vitamins-Minerals (MENS MULTIVITAMIN PLUS) TABS Take 1 tablet by mouth at bedtime.     . nitroGLYCERIN (NITROSTAT) 0.4 MG SL tablet Place 1 tablet under tongue every 5 mins. DO NOT USE MORE THAN 3 TABS 25 tablet 1  . rosuvastatin (CRESTOR) 40 MG tablet TAKE 1 TABLET BY MOUTH DAILY. PLEASE MAKE OVERDUE YEARLY APPT WITH DR. [name] BEFORE ANYMORE REFILLS (Patient taking differently: Take 40 mg by mouth daily. ) 90 tablet 3  . traZODone (DESYREL) 50 MG tablet Take 0.5-1 tablets (25-50 mg total) by mouth at bedtime as needed for sleep. 90 tablet 1  . isosorbide mononitrate (IMDUR) 60 MG 24 hr tablet Take 1 tablet (60 mg total) by mouth daily. 90 tablet 3  . metFORMIN (GLUCOPHAGE) 500 MG tablet Take 1 tablet (500 mg total) by mouth 3 (three) times daily. (Patient not taking: Reported on 08/23/2019) 90 tablet 11   No  current facility-administered medications on file prior to visit.     ALLERGIES: Allergies  Allergen Reactions  . Eggs Or Egg-Derived Products Itching    FAMILY HISTORY: Family History  Problem Relation Age of Onset  . Ulcers Father        had stomach issues, not sure what happened  . Coronary artery disease Brother        male 1st degree relative <50  . Colon cancer Neg Hx   . Esophageal cancer Neg Hx   . Rectal cancer Neg Hx   . Stomach cancer Neg Hx     SOCIAL HISTORY: Social History   Socioeconomic History  . Marital status: Married    Spouse name: Not on file  . Number of children: 2  . Years of education: Not on file  . Highest education level: Not on file  Occupational History  . Occupation: retired  Scientific laboratory technician  . Financial resource strain: Somewhat hard  . Food insecurity    Worry: Sometimes true    Inability: Sometimes true  . Transportation needs    Medical: No    Non-medical: No  Tobacco Use  . Smoking status: Former Smoker    Packs/day: 1.00    Years: 40.00    Pack years: 40.00    Types: Cigarettes    Quit date: 04/03/2010    Years since quitting: 9.3  . Smokeless tobacco: Never Used  Substance and Sexual Activity  . Alcohol use: Not Currently    Comment: "used to drink when I was young; quit ~ 1980's"  . Drug use: Never  . Sexual activity: Yes  Lifestyle  . Physical activity    Days per week: 3 days    Minutes per session: 30 min  . Stress: Only a little  Relationships  . Social connections    Talks on phone: More than three times a week    Gets together: Twice a week    Attends religious service: 1 to 4 times per year    Active member of club or organization: No    Attends meetings of clubs or organizations: Never    Relationship status: Married  . Intimate partner violence    Fear of current or ex partner: No    Emotionally abused: No    Physically abused: No    Forced sexual activity: No  Other Topics Concern  . Not on file   Social History Narrative   Right handed   Two story  home   4 children    REVIEW OF SYSTEMS: Constitutional: No fevers, chills, or sweats, no generalized fatigue, change in appetite Eyes: No visual changes, double vision, eye pain Ear, nose and throat: No hearing loss, ear pain, nasal congestion, sore throat Cardiovascular: No chest pain, palpitations Respiratory:  No shortness of breath at rest or with exertion, wheezes GastrointestinaI: No nausea, vomiting, diarrhea, abdominal pain, fecal incontinence Genitourinary:  No dysuria, urinary retention or frequency Musculoskeletal:  +occl neck pain, no back pain Integumentary: No rash, pruritus, skin lesions Neurological: as above Psychiatric: No depression, insomnia, anxiety Endocrine: No palpitations, fatigue, diaphoresis, mood swings, change in appetite, change in weight, increased thirst Hematologic/Lymphatic:  No anemia, purpura, petechiae. Allergic/Immunologic: no itchy/runny eyes, nasal congestion, recent allergic reactions, rashes  PHYSICAL EXAM: Vitals:   08/23/19 0856  BP: 140/60  Pulse: 78  SpO2: 98%   General: No acute distress Head:  Normocephalic/atraumatic Skin/Extremities: No rash, no edema Neurological Exam: Mental status: alert and oriented to person, place, and time, no dysarthria or aphasia, Fund of knowledge is appropriate.  Recent and remote memory are impaired.  Attention and concentration are reduced. Bromide Exam 08/23/2019  Weekday Correct 1  Current year 1  What state are we in? 1  Amount spent 0  Amount left 0  # of Animals 2  5 objects recall 2  Number series 0  Hour markers 1  Time correct 0  Placed X in triangle correctly 1  Largest Figure 1  Name of male 0  Date back to work 1  Type of work [employer] she lived in 0  Total score 11   Cranial nerves: CN I: not tested CN II: pupils equal, round and reactive to light, visual fields intact CN III, IV, VI:  full range  of motion, no nystagmus, no ptosis CN V: decreased pin on right V1 distribution CN VII: upper and lower face symmetric CN VIII: hearing intact to conversation CN IX, X: gag intact, uvula midline CN XI: sternocleidomastoid and trapezius muscles intact CN XII: tongue midline Bulk & Tone: normal, no fasciculations. Motor: 5/5 throughout with no pronator drift. Sensation: decreased pin on left UE and LE, intact cold, vibration and joint position sense.  No extinction to double simultaneous stimulation.  Romberg test negative Deep Tendon Reflexes: +2 throughout except for absent ankle jerks bilaterally, no ankle clonus Plantar responses: downgoing bilaterally Cerebellar: no incoordination on finger to nose testing Gait: narrow-based and steady, able to tandem walk adequately. Tremor: none  IMPRESSION: This is a pleasant 73 year old right-handed man with a history of hypertension, hyperlipidemia, PAD, presenting for evaluation of memory loss. He feels that memory changes started soon after his surgery in February 2020 where he had a left femoral to AK popliteal artery bypass under general anesthesia. He describes quite noticeable memory changes where he is now unable to manage complex tasks. SLUMS score today 11/30. Symptoms concerning for mild dementia, etiology unclear. Head CT showed old left posterior parietal infarct and old left superior cerebellar lacunar infarcts, mild chronic microvascular disease, no acute change. MRI brain without contrast will be ordered to further evaluate symptoms. We discussed starting Donepezil, including side effects and expectations from medication.Start Donepezil 43m daily for 2 weeks, then increase to 160mdaily. He continues to have sleep difficulties and will increase Trazodone to 10067mhs. Continue to monitor driving. Follow-up in 6 months, he knows to call for any changes.   Thank you for allowing me to  participate in the care of this patient. Please do not  hesitate to call for any questions or concerns.   Ellouise Newer, M.D.  CC: Dr. Alain Marion

## 2019-09-05 ENCOUNTER — Ambulatory Visit (INDEPENDENT_AMBULATORY_CARE_PROVIDER_SITE_OTHER): Payer: PPO | Admitting: Internal Medicine

## 2019-09-05 ENCOUNTER — Encounter: Payer: Self-pay | Admitting: Internal Medicine

## 2019-09-05 ENCOUNTER — Other Ambulatory Visit: Payer: Self-pay

## 2019-09-05 VITALS — BP 130/68 | HR 61 | Temp 97.5°F | Ht 65.0 in | Wt 173.0 lb

## 2019-09-05 DIAGNOSIS — M2550 Pain in unspecified joint: Secondary | ICD-10-CM | POA: Diagnosis not present

## 2019-09-05 DIAGNOSIS — I70212 Atherosclerosis of native arteries of extremities with intermittent claudication, left leg: Secondary | ICD-10-CM | POA: Diagnosis not present

## 2019-09-05 DIAGNOSIS — R413 Other amnesia: Secondary | ICD-10-CM

## 2019-09-05 DIAGNOSIS — R5383 Other fatigue: Secondary | ICD-10-CM | POA: Insufficient documentation

## 2019-09-05 DIAGNOSIS — I25119 Atherosclerotic heart disease of native coronary artery with unspecified angina pectoris: Secondary | ICD-10-CM | POA: Diagnosis not present

## 2019-09-05 DIAGNOSIS — Z23 Encounter for immunization: Secondary | ICD-10-CM | POA: Diagnosis not present

## 2019-09-05 NOTE — Assessment & Plan Note (Signed)
Crestor Re-start Plavix

## 2019-09-05 NOTE — Assessment & Plan Note (Addendum)
Aricept Brain MRI tomorrow

## 2019-09-05 NOTE — Progress Notes (Signed)
° °Subjective:  °Patient ID: Harold Waters, male    DOB: 05/06/1946  Age: 73 y.o. MRN: [id number] °Following and CC: No chief complaint on file. ° ° °HPI °Harold Waters presents for memory loss, CAD °C/o B shoulder pain - worse at night °Pt stopped Plavix and Coreg due to fatigue - better ° °Outpatient Medications Prior to Visit  °Medication Sig Dispense Refill  °• aspirin EC 81 MG tablet Take 81 mg by mouth daily.    °• Blood Glucose Monitoring Suppl (ONETOUCH VERIO) w/Device KIT 1 Units by Does not apply route daily as needed. 1 kit 1  °• carvedilol (COREG) 3.125 MG tablet TAKE 1 TABLET BY MOUTH 2 (TWO) TIMES DAILY WITH A MEAL. 180 tablet 3  °• Cholecalciferol (VITAMIN D3) 50 MCG (2000 UT) capsule Take 1 capsule (2,000 Units total) by mouth daily. 100 capsule 3  °• clopidogrel (PLAVIX) 75 MG tablet Take 75 mg by mouth daily.    °• donepezil (ARICEPT) 10 MG tablet Take 1/2 tablet daily for 2 weeks, then increase to 1 tablet daily 30 tablet 11  °• glucose blood (ONETOUCH VERIO) test strip Use to check blood sugar daily 50 each 11  °• metFORMIN (GLUCOPHAGE) 500 MG tablet Take 1 tablet (500 mg total) by mouth 3 (three) times daily. 90 tablet 11  °• Multiple Vitamins-Minerals (MENS MULTIVITAMIN PLUS) TABS Take 1 tablet by mouth at bedtime.     °• nitroGLYCERIN (NITROSTAT) 0.4 MG SL tablet Place 1 tablet under tongue every 5 mins. DO NOT USE MORE THAN 3 TABS 25 tablet 1  °• rosuvastatin (CRESTOR) 40 MG tablet TAKE 1 TABLET BY MOUTH DAILY. PLEASE MAKE OVERDUE YEARLY APPT WITH DR. COOPER BEFORE ANYMORE REFILLS (Patient taking differently: Take 40 mg by mouth daily. ) 90 tablet 3  °• traZODone (DESYREL) 50 MG tablet Take 2 tablets at bedtime 60 tablet 11  °• isosorbide mononitrate (IMDUR) 60 MG 24 hr tablet Take 1 tablet (60 mg total) by mouth daily. 90 tablet 3  ° °No facility-administered medications prior to visit.   ° ° °ROS: °Review of Systems  °Constitutional: Positive for fatigue. Negative for appetite change and  unexpected weight change.  °HENT: Negative for congestion, nosebleeds, sneezing, sore throat and trouble swallowing.   °Eyes: Negative for itching and visual disturbance.  °Respiratory: Negative for cough.   °Cardiovascular: Negative for chest pain, palpitations and leg swelling.  °Gastrointestinal: Negative for abdominal distention, blood in stool, diarrhea and nausea.  °Genitourinary: Negative for frequency and hematuria.  °Musculoskeletal: Positive for arthralgias and back pain. Negative for gait problem, joint swelling and neck pain.  °Skin: Negative for rash.  °Neurological: Negative for dizziness, tremors, speech difficulty and weakness.  °Psychiatric/Behavioral: Positive for decreased concentration. Negative for agitation, dysphoric mood, sleep disturbance and suicidal ideas. The patient is not nervous/anxious.   ° ° °Objective:  °BP 130/68 (BP Location: Left Arm, Patient Position: Sitting, Cuff Size: Normal)    Pulse 61    Temp (!) 97.5 °F (36.4 °C) (Oral)    Ht 5' 5" (1.651 m)    Wt 173 lb (78.5 kg)    SpO2 94%    BMI 28.79 kg/m²  ° °BP Readings from Last 3 Encounters:  °09/05/19 130/68  °08/23/19 140/60  °08/04/19 119/72  ° ° °Wt Readings from Last 3 Encounters:  °09/05/19 173 lb (78.5 kg)  °08/23/19 172 lb (78 kg)  °08/04/19 171 lb (77.6 kg)  ° ° °Physical Exam °Constitutional:   °   General:   He is not in acute distress. °   Appearance: He is well-developed.  °   Comments: NAD  °Eyes:  °   Conjunctiva/sclera: Conjunctivae normal.  °   Pupils: Pupils are equal, round, and reactive to light.  °Neck:  °   Musculoskeletal: Normal range of motion.  °   Thyroid: No thyromegaly.  °   Vascular: No JVD.  °Cardiovascular:  °   Rate and Rhythm: Normal rate and regular rhythm.  °   Heart sounds: Normal heart sounds. No murmur. No friction rub. No gallop.   °Pulmonary:  °   Effort: Pulmonary effort is normal. No respiratory distress.  °   Breath sounds: Normal breath sounds. No wheezing or rales.  °Chest:  °   Chest  wall: No tenderness.  °Abdominal:  °   General: Bowel sounds are normal. There is no distension.  °   Palpations: Abdomen is soft. There is no mass.  °   Tenderness: There is no abdominal tenderness. There is no guarding or rebound.  °Musculoskeletal: Normal range of motion.     °   General: Tenderness present.  °Lymphadenopathy:  °   Cervical: No cervical adenopathy.  °Skin: °   General: Skin is warm and dry.  °   Findings: No rash.  °Neurological:  °   Mental Status: He is alert and oriented to person, place, and time.  °   Cranial Nerves: No cranial nerve deficit.  °   Motor: No abnormal muscle tone.  °   Coordination: Coordination normal.  °   Gait: Gait normal.  °   Deep Tendon Reflexes: Reflexes are normal and symmetric.  °Psychiatric:     °   Behavior: Behavior normal.     °   Thought Content: Thought content normal.     °   Judgment: Judgment normal.  ° ° ° °Lab Results  °Component Value Date  ° WBC 6.2 07/03/2019  ° HGB 13.8 07/03/2019  ° HCT 41.8 07/03/2019  ° PLT 239.0 07/03/2019  ° GLUCOSE 123 (H) 07/03/2019  ° CHOL 156 05/03/2017  ° TRIG 150 (H) 05/03/2017  ° HDL 46 05/03/2017  ° LDLDIRECT 113.2 08/19/2011  ° LDLCALC 80 05/03/2017  ° ALT 33 11/28/2018  ° AST 20 11/28/2018  ° NA 138 07/03/2019  ° K 4.3 07/03/2019  ° CL 104 07/03/2019  ° CREATININE 1.10 07/03/2019  ° BUN 21 07/03/2019  ° CO2 26 07/03/2019  ° TSH 4.91 (H) 07/03/2019  ° PSA 3.69 12/24/2014  ° INR 0.99 11/28/2018  ° HGBA1C 8.2 (H) 12/29/2018  ° ° °Vas Us Abi With/wo Tbi ° °Result Date: 08/04/2019 °LOWER EXTREMITY DOPPLER STUDY Indications: Peripheral artery disease, and Pain at the surgical site. High Risk Factors: Hypertension, hyperlipidemia, prior MI.  Vascular Interventions: Left fem-pop bypass graft 12/06/2018. Performing Technologist: Erica Mcgonigal RVS, RCS  Examination Guidelines: A complete evaluation includes at minimum, Doppler waveform signals and systolic blood pressure reading at the level of bilateral brachial, anterior  tibial, and posterior tibial arteries, when vessel segments are accessible. Bilateral testing is considered an integral part of a complete examination. Photoelectric Plethysmograph (PPG) waveforms and toe systolic pressure readings are included as required and additional duplex testing as needed. Limited examinations for reoccurring indications may be performed as noted.  ABI Findings: +---------+------------------+-----+---------+--------+  Right     Rt Pressure (mmHg) Index Waveform  Comment   +---------+------------------+-----+---------+--------+  Brachial  129                                          +---------+------------------+-----+---------+--------+    PTA       154                1.16  triphasic           +---------+------------------+-----+---------+--------+  DP        135                1.02  biphasic            +---------+------------------+-----+---------+--------+  Great Toe 73                 0.55                      +---------+------------------+-----+---------+--------+ +---------+------------------+-----+---------+-------+  Left      Lt Pressure (mmHg) Index Waveform  Comment  +---------+------------------+-----+---------+-------+  Brachial  133                                         +---------+------------------+-----+---------+-------+  PTA       146                1.10  triphasic          +---------+------------------+-----+---------+-------+  DP        127                0.95  biphasic           +---------+------------------+-----+---------+-------+  Great Toe 77                 0.58                     +---------+------------------+-----+---------+-------+ +-------+-----------+-----------+------------+------------+  ABI/TBI Today's ABI Today's TBI Previous ABI Previous TBI  +-------+-----------+-----------+------------+------------+  Right   1.16        0.55        1.13         0.55          +-------+-----------+-----------+------------+------------+  Left    1.10        0.58        1.17          0.61          +-------+-----------+-----------+------------+------------+  Summary: Right: Resting right ankle-brachial index is within normal range. No evidence of significant right lower extremity arterial disease. The right toe-brachial index is abnormal. Left: Resting left ankle-brachial index is within normal range. No evidence of significant left lower extremity arterial disease. The left toe-brachial index is abnormal.  *See table(s) above for measurements and observations.  Electronically signed by Brandon Cain MD on 08/04/2019 at 11:04:31 AM.    Final   ° °Vas Us Lower Extremity Arterial Duplex ° °Result Date: 08/04/2019 °LOWER EXTREMITY ARTERIAL DUPLEX STUDY Indications: Peripheral artery disease, and Patient reports sharp pain at              surgical site and swelling in left lower extremity. High Risk Factors: Hypertension, hyperlipidemia, coronary artery disease.  Vascular Interventions: 12/06/2018: Left common femoral to above-knee popliteal                         artery bypass with ipsilateral reversed greater                         saphenous vein. Current ABI:              R=1.16, L=1.10 Performing Technologist: Erica Mcgonigal RVS, RCS  Examination Guidelines: A complete evaluation includes B-mode imaging, spectral Doppler, color Doppler, and power Doppler as needed of all accessible portions of each vessel. Bilateral testing is considered an integral part of a complete examination. Limited examinations for reoccurring indications may be performed as noted.  Left Graft #1: +--------------------+--------+--------+--------+--------+                       PSV cm/s Stenosis Waveform Comments  +--------------------+--------+--------+--------+--------+  Inflow               111               biphasic           +--------------------+--------+--------+--------+--------+  Proximal Anastomosis 93                biphasic           +--------------------+--------+--------+--------+--------+  Proximal Graft        157      <50%     biphasic           +--------------------+--------+--------+--------+--------+  Mid Graft            38                biphasic           +--------------------+--------+--------+--------+--------+  Distal Graft         45                biphasic           +--------------------+--------+--------+--------+--------+  Distal Anastomosis   47                biphasic           +--------------------+--------+--------+--------+--------+  Outflow              80                biphasic           +--------------------+--------+--------+--------+--------+   Summary: Left: Patent left femoropopliteal bypass graft with mildly elevated velocities and flow disturbance observed in the proximal segment.  See table(s) above for measurements and observations. Electronically signed by Brandon Cain MD on 08/04/2019 at 11:16:20 AM.    Final   ° ° °Assessment & Plan:  ° °There are no diagnoses linked to this encounter. ° ° °No orders of the defined types were placed in this encounter. ° ° ° °Follow-up: No follow-ups on file. ° °Alex , MD °

## 2019-09-05 NOTE — Assessment & Plan Note (Signed)
Pt stopped Plavix and Coreg due to fatigue - better. Re-start Plavix

## 2019-09-05 NOTE — Assessment & Plan Note (Signed)
?  Crestor related. Hold Rx x 1 wk - re-star QOD Vit D

## 2019-09-06 ENCOUNTER — Ambulatory Visit
Admission: RE | Admit: 2019-09-06 | Discharge: 2019-09-06 | Disposition: A | Discharge status: Home / Self Care | Payer: PPO | Source: Ambulatory Visit | Attending: Neurology | Admitting: Neurology

## 2019-09-06 DIAGNOSIS — R413 Other amnesia: Secondary | ICD-10-CM | POA: Diagnosis not present

## 2019-09-06 NOTE — Addendum Note (Signed)
Addended by: Karren Cobble on: 09/06/2019 09:03 AM   Modules accepted: Orders

## 2019-09-07 ENCOUNTER — Telehealth: Payer: Self-pay

## 2019-09-07 NOTE — Telephone Encounter (Signed)
Pt informed of MRI results. Instructed pt that he did need to monitor his BP and cholesterol. Pt states that one of his doctors took him off of his cholesterol medications due to his shoulder pain so he was not sure what to do about starting back on it. I asked pt to consult with that physician and to call back with any concerns.

## 2019-09-07 NOTE — Telephone Encounter (Signed)
-----   Message from Cameron Sprang, MD sent at 09/06/2019  5:05 PM EST ----- Pls let him know the MRI brain did not show any evidence of tumor or bleed. It showed age-related changes, hardening of the small vessels in the brain seen in patients with blood pressure and cholesterol issues, and old small strokes, no new stroke seen. Thanks

## 2019-09-15 ENCOUNTER — Other Ambulatory Visit: Payer: Self-pay | Admitting: Neurology

## 2019-10-21 ENCOUNTER — Other Ambulatory Visit: Payer: Self-pay | Admitting: Cardiovascular Disease

## 2019-10-24 NOTE — Progress Notes (Signed)
Cardiology Clinic Note   Patient Name: Harold Waters Date of Encounter: 10/25/2019  Primary Care Provider:  Cassandria Anger, MD Primary Cardiologist:  [name], MD  Patient Profile    Harold Waters 73 year old male presents today for follow-up of his essential hypertension, MI, CAD, venous insufficiency, PSVT, and dyslipidemia.  Past Medical History    Past Medical History:  Diagnosis Date  . Angina   . Chronic stomach ulcer    "get them off and on" (09/19/2018)  . Coronary atherosclerosis of unspecified type of vessel, native or graft   . Diverticulosis   . DVT of lower extremity (deep venous thrombosis) ([place]) ~ 2010   LLE  . Erosive gastritis   . Essential hypertension, benign   . GERD (gastroesophageal reflux disease)   . Headache(784.0) 02/18/12   "lately"  . History of kidney stones   . Internal hemorrhoids   . Lower back pain   . Myocardial infarction ([place]) 1997  . Osteoarthritis   . Other and unspecified hyperlipidemia   . PAD (peripheral artery disease) ([facility])    with ABI's 0.8 on the right and 0.86 on the left  . Pneumonia 1957  . PSVT (paroxysmal supraventricular tachycardia) ([place]) 02/18/12  . Shortness of breath    "lying down"  . Sleep apnea    does not wear c-pap; pt does not recall this hx on 09/19/2018   Past Surgical History:  Procedure Laterality Date  . ARTHROSCOPY KNEE W/ DRILLING Right ~ 2001  . BYPASS GRAFT Left 12/05/2018   femoral popliteal   . CARDIAC CATHETERIZATION  09/19/2018  . COLONOSCOPY    . CORONARY ANGIOPLASTY WITH STENT PLACEMENT  1997   "2"  . FEMORAL-POPLITEAL BYPASS GRAFT Left 12/06/2018   Procedure: BYPASS GRAFT FEMORAL-POPLITEAL ARTERY;  Surgeon: Waynetta Sandy, MD;  Location: [facility];  Service: Vascular;  Laterality: Left;  . ILIAC ARTERY STENT  04/22/2017   . Placement of a 6 mm x 100 mm Viabahn covered stent left SFA  . INTRAVASCULAR PRESSURE WIRE/FFR STUDY  09/19/2018  . INTRAVASCULAR PRESSURE  WIRE/FFR STUDY N/A 09/19/2018   Procedure: INTRAVASCULAR PRESSURE WIRE/FFR STUDY;  Surgeon: [name], MD;  Location: [place] [facility];  Service: Cardiovascular;  Laterality: N/A;  . LEFT HEART CATH AND CORONARY ANGIOGRAPHY N/A 09/19/2018   Procedure: LEFT HEART CATH AND CORONARY ANGIOGRAPHY;  Surgeon: [name], MD;  Location: [place] [facility];  Service: Cardiovascular;  Laterality: N/A;  . LEFT HEART CATHETERIZATION WITH CORONARY ANGIOGRAM N/A 02/19/2012   Procedure: LEFT HEART CATHETERIZATION WITH CORONARY ANGIOGRAM;  Surgeon: Larey Dresser, MD;  Location: [facility] C[facility];  Service: Cardiovascular;  Laterality: N/A;  . LOWER EXTREMITY ANGIOGRAPHY Bilateral 04/22/2017   Procedure: Lower Extremity Angiography;  Surgeon: [name], MD;  Location: [place] [facility];  Service: Cardiovascular;  Laterality: Bilateral;  . LOWER EXTREMITY ANGIOGRAPHY Bilateral 09/19/2018  . LOWER EXTREMITY ANGIOGRAPHY Bilateral 09/19/2018   Procedure: LOWER EXTREMITY ANGIOGRAPHY;  Surgeon: [name], MD;  Location: [place] [facility];  Service: Cardiovascular;  Laterality: Bilateral;  . PERIPHERAL ARTERIAL STENT GRAFT  09/2010   LLE  . PERIPHERAL VASCULAR ATHERECTOMY Left 04/22/2017   Procedure: Peripheral Vascular Atherectomy;  Surgeon: [name], MD;  Location: [place] [facility];  Service: Cardiovascular;  Laterality: Left;  SFA  . PERIPHERAL VASCULAR INTERVENTION Left 04/22/2017   Procedure: Peripheral Vascular Intervention;  Surgeon: [name], MD;  Location: [place] [facility];  Service: Cardiovascular;  Laterality: Left;  SFA  . UPPER GASTROINTESTINAL ENDOSCOPY      Allergies  Allergies  Allergen Reactions  . Eggs Or Egg-Derived Products Itching  . Coreg [Carvedilol]     fatigue    History of Present Illness    [name] has a past medical history of coronary artery disease and vascular disease.  He underwent interventions to his RCA and circumflex in  the 1990s.  In 2011 he had an unsuccessful attempt at an OM intervention.  A cardiac catheterization in April 2013 resulted in medical therapy.  He is also having vascular disease with prior left S FNA PTA in 2011 with a redo in 2018.  More recently he has had lower extremity claudication.  His left SFA stent was evaluated.  The peripheral  angiogram showed an occluded left SFA stent which will require surgical intervention.  He also has a history of chest pain.  A catheterization and November 2019 showed three-vessel coronary artery disease with a 90% RCA, 99% OM 3, 50 to 60% LAD.  Again, medical therapy was recommended.  His echocardiogram from October 2019 showed normal LVEF.  He was last seen by Kerin Ransom, PA-C on 11/25/2018.  During that time he was being reevaluated for chest pain that he presented with 10 days prior.  His Imdur has been increased to 60 mg and he was doing well at that time.  His chest pain resolved.  He was given cardiac clearance for surgery on 12/06/2018.  He underwent a successful left femoropopliteal bypass using his SVG.  On 08/04/2019 he presented to vascular surgery Dr. Donzetta Matters for follow-up and was noted to have palpable pedal pulse and it was thought that he may have some intragraft stenosis with a downstream velocity of 38 cm/s.  He presents the clinic today for follow-up and states he is only able to walk around 500 feet before he experiences left calf cramping.  He will follow up with Dr. Donzetta Matters in early January for reevaluation of his PAD.  He states that daily as he climbs the stairs or does more vigorous activities he notices chest discomfort and pressure in his neck.  With rest the symptoms subside.  He continues to be physically active doing his cardiac rehab exercises daily.  He is eating a heart healthy low-sodium diet and has tried to decrease his carbohydrates as well.  He stopped taking his Crestor a few months ago after seeing his PCP and complaining of arm discomfort.  I  will recheck a direct LDL today, order a BMP, and increase his Imdur.  He will follow-up in 3 months.  He denies shortness of breath, lower extremity edema, fatigue, palpitations, melena, hematuria, hemoptysis, diaphoresis, weakness, presyncope, syncope, orthopnea, and PND.    Home Medications    Prior to Admission medications   Medication Sig Start Date End Date Taking? Authorizing Provider  aspirin EC 81 MG tablet Take 81 mg by mouth daily.    [provider]  Blood Glucose Monitoring Suppl (ONETOUCH VERIO) w/Device KIT 1 Units by Does not apply route daily as needed. 12/29/18   Plotnikov, [name], MD  Cholecalciferol (VITAMIN D3) 50 MCG (2000 UT) capsule Take 1 capsule (2,000 Units total) by mouth daily. 07/03/19   Plotnikov, [name], MD  clopidogrel (PLAVIX) 75 MG tablet Take 75 mg by mouth daily.    [provider]  donepezil (ARICEPT) 10 MG tablet Take 1/2 tablet daily for 2 weeks, then increase to 1 tablet  daily 08/23/19   Cameron Sprang, MD  glucose blood [facility] VERIO) test strip Use to check blood sugar daily 12/30/18   Plotnikov, [name], MD  isosorbide mononitrate (IMDUR) 60 MG 24 hr tablet Take 1 tablet (60 mg total) by mouth daily. 11/15/18 02/13/19  Erlene Quan, PA-C  metFORMIN (GLUCOPHAGE) 500 MG tablet Take 1 tablet (500 mg total) by mouth 3 (three) times daily. 01/01/19   Plotnikov, [name], MD  Multiple Vitamins-Minerals (MENS MULTIVITAMIN PLUS) TABS Take 1 tablet by mouth at bedtime.     [provider]  nitroGLYCERIN (NITROSTAT) 0.4 MG SL tablet Place 1 tablet under tongue every 5 mins. DO NOT USE MORE THAN 3 TABS 11/15/18   Kilroy, [name], PA-C  rosuvastatin (CRESTOR) 40 MG tablet TAKE 1 TABLET BY MOUTH DAILY. PLEASE MAKE OVERDUE YEARLY APPT WITH DR. [name] BEFORE ANYMORE REFILLS Patient taking differently: Take 40 mg by mouth daily.  09/26/18   [name], MD  traZODone (DESYREL) 50 MG tablet TAKE 2 TABLETS BY MOUTH EVERY DAY AT  BEDTIME 09/15/19   Cameron Sprang, MD    Family History    Family History  Problem Relation Age of Onset  . Ulcers Father        had stomach issues, not sure what happened  . Coronary artery disease Brother        male 1st degree relative <50  . Colon cancer Neg Hx   . Esophageal cancer Neg Hx   . Rectal cancer Neg Hx   . Stomach cancer Neg Hx    He indicated that his mother is deceased. He indicated that his father is deceased. He indicated that the status of his brother is unknown. He indicated that the status of his neg hx is unknown.  Social History    Social History   Socioeconomic History  . Marital status: Married    Spouse name: Not on file  . Number of children: 2  . Years of education: Not on file  . Highest education level: Not on file  Occupational History  . Occupation: retired  Tobacco Use  . Smoking status: Former Smoker    Packs/day: 1.00    Years: 40.00    Pack years: 40.00    Types: Cigarettes    Quit date: 04/03/2010    Years since quitting: 9.5  . Smokeless tobacco: Never Used  Substance and Sexual Activity  . Alcohol use: Not Currently    Comment: "used to drink when I was young; quit ~ 1980's"  . Drug use: Never  . Sexual activity: Yes  Other Topics Concern  . Not on file  Social History Narrative   Right handed   Two story home   4 children   Social Determinants of Health   Financial Resource Strain: Medium Risk  . Difficulty of Paying Living Expenses: Somewhat hard  Food Insecurity: Food Insecurity Present  . Worried About Charity fundraiser in the Last Year: Sometimes true  . Ran Out of Food in the Last Year: Sometimes true  Transportation Needs: No Transportation Needs  . Lack of Transportation (Medical): No  . Lack of Transportation (Non-Medical): No  Physical Activity: Insufficiently Active  . Days of Exercise per Week: 3 days  . Minutes of Exercise per Session: 30 min  Stress: No Stress Concern Present  . Feeling of Stress  : Only a little  Social Connections: Slightly Isolated  . Frequency of Communication with Friends and Family: More than  three times a week  . Frequency of Social Gatherings with Friends and Family: Twice a week  . Attends Religious Services: 1 to 4 times per year  . Active Member of Clubs or Organizations: No  . Attends Archivist Meetings: Never  . Marital Status: Married  Human resources officer Violence: Not At Risk  . Fear of Current or Ex-Partner: No  . Emotionally Abused: No  . Physically Abused: No  . Sexually Abused: No     Review of Systems    General:  No chills, fever, night sweats or weight changes.  Cardiovascular:  No chest pain, dyspnea on exertion, edema, orthopnea, palpitations, paroxysmal nocturnal dyspnea. Dermatological: No rash, lesions/masses Respiratory: No cough, dyspnea Urologic: No hematuria, dysuria Abdominal:   No nausea, vomiting, diarrhea, bright red blood per rectum, melena, or hematemesis Neurologic:  No visual changes, wkns, changes in mental status. All other systems reviewed and are otherwise negative except as noted above.  Physical Exam    VS:  BP 137/63   Pulse 62   Ht 5' 5"  (1.651 m)   Wt 168 lb 9.6 oz (76.5 kg)   SpO2 (!) 60%   BMI 28.06 kg/m  , BMI Body mass index is 28.06 kg/m. GEN: Well nourished, well developed, in no acute distress. HEENT: normal. Neck: Supple, no JVD, carotid bruits, or masses. Cardiac: RRR, 3/6 systolic murmur heard along right sternal border, rubs, or gallops. No clubbing, cyanosis, edema.  Radials2+, DP/PT 1+ and equal bilaterally.  Respiratory:  Respirations regular and unlabored, clear to auscultation bilaterally. GI: Soft, nontender, nondistended, BS + x 4. MS: no deformity or atrophy. Skin: warm and dry, no rash. Neuro:  Strength and sensation are intact. Psych: Normal affect.  Accessory Clinical Findings    ECG personally reviewed by me today- normal sinus rhythm LVH 62 bpm- No acute  changes   EKG November 14, 2018 Normal sinus rhythm 66 bpm  Echocardiogram 08/19/2018 Study Conclusions  - Left ventricle: The cavity size was normal. There was mild   concentric hypertrophy. Systolic function was normal. The   estimated ejection fraction was in the range of 60% to 65%. Wall   motion was normal; there were no regional wall motion   abnormalities. Doppler parameters are consistent with abnormal   left ventricular relaxation (grade 1 diastolic dysfunction).   Doppler parameters are consistent with elevated ventricular   end-diastolic filling pressure. - Aortic valve: Trileaflet; mildly thickened, mildly calcified   leaflets. There was mild regurgitation. - Aortic root: The aortic root was normal in size. - Mitral valve: There was mild regurgitation. - Left atrium: The atrium was normal in size. - Right ventricle: Systolic function was normal. - Right atrium: The atrium was normal in size. - Tricuspid valve: There was mild regurgitation. - Pulmonic valve: There was mild regurgitation. - Pulmonary arteries: Systolic pressure was within the normal   range. - Inferior vena cava: The vessel was normal in size. - Pericardium, extracardiac: There was no pericardial effusion.  Assessment & Plan   1.  Coronary artery disease-no chest pain today.  Notices daily chest pain with more vigorous activities such as climbing stairs or fast walking. Increase isosorbide mononitrate 120 mg daily Continue aspirin 81 mg tablet daily Continue clopidogrel 75 mg tablet daily Continue nitroglycerin 0.4 mg sublingual as needed Heart healthy low-sodium diet-salty 6 given Increase physical activity as tolerated  Peripheral arterial disease-underwent successful left femoropopliteal bypass on 12/06/2018.  Pedal pulses palpable. Continue clopidogrel 75 mg daily 81  mg aspirin daily  Essential hypertension-BP today 137/6363 Heart healthy low-sodium diet-salty 6 given Increase physical  activity as tolerated Weight loss Order BMP  Hyperlipidemia-LDL 80 05/03/2017.  Patient indicates that he stopped a statin several months ago due to arm pain. Continue statin if needed after lipid check Heart healthy low-sodium high-fiber diet Increase physical activity as tolerated Direct LDL-patient had toast and coffee this morning for breakfast.  Disposition: Follow-up with Dr. [name] in 3 months.  Jossie Ng. [facility] [place] [address] Office [phone] Fax [phone]

## 2019-10-25 ENCOUNTER — Ambulatory Visit (INDEPENDENT_AMBULATORY_CARE_PROVIDER_SITE_OTHER): Payer: PPO | Admitting: General Practice

## 2019-10-25 ENCOUNTER — Other Ambulatory Visit: Payer: Self-pay

## 2019-10-25 ENCOUNTER — Encounter: Payer: Self-pay | Admitting: General Practice

## 2019-10-25 VITALS — BP 137/63 | HR 62 | Ht 65.0 in | Wt 168.6 lb

## 2019-10-25 DIAGNOSIS — I25119 Atherosclerotic heart disease of native coronary artery with unspecified angina pectoris: Secondary | ICD-10-CM

## 2019-10-25 DIAGNOSIS — I739 Peripheral vascular disease, unspecified: Secondary | ICD-10-CM

## 2019-10-25 DIAGNOSIS — I10 Essential (primary) hypertension: Secondary | ICD-10-CM | POA: Diagnosis not present

## 2019-10-25 DIAGNOSIS — E785 Hyperlipidemia, unspecified: Secondary | ICD-10-CM | POA: Diagnosis not present

## 2019-10-25 DIAGNOSIS — Z79899 Other long term (current) drug therapy: Secondary | ICD-10-CM

## 2019-10-25 MED ORDER — ISOSORBIDE MONONITRATE ER 120 MG PO TB24: 120.0000 mg | ORAL_TABLET | Freq: Every day | ORAL | 3 refills | Status: DC

## 2019-10-25 NOTE — Patient Instructions (Signed)
Medication Instructions:  INCREASE ISOSORBIDE 120MG  DAILY If you need a refill on your cardiac medications before your next appointment, please call your pharmacy.  Labwork: DIRECT LDL AND BMET TODAY HERE IN OUR OFFICE AT [facility]    If you have labs (blood work) drawn today and your tests are completely normal, you will receive your results only by: [name] MyChart Message (if you have MyChart) OR . A paper copy in the mail If you have any lab test that is abnormal or we need to change your treatment, we will call you to review the results.  Special Instructions: PLEASE READ AND FOLLOW SALTY 6 ATTACHED  Reduce your risk of getting COVID-19 With your heart disease it is especially important for people at increased risk of severe illness from COVID-19, and those who live with them, to protect themselves from getting COVID-19. The best way to protect yourself and to help reduce the spread of the virus that causes COVID-19 is to: [name] Limit your interactions with other people as much as possible. . Take precautions to prevent getting COVID-19 when you do interact with others. If you start feeling sick and think you may have COVID-19, get in touch with your healthcare provider within 24 hours.  Follow-Up: IN 3 months In Person You may see [name], MD Coletta Memos, FNP or one of the following Advanced Practice Providers on your designated Care Team:  Richardson Dopp, PA-C [place], [place]  Daune Perch, NP.    At [facility], you and your health needs are our priority.  As part of our continuing mission to provide you with exceptional heart care, we have created designated Provider Care Teams.  These Care Teams include your primary Cardiologist (physician) and Advanced Practice Providers (APPs -  Physician Assistants and Nurse Practitioners) who all work together to provide you with the care you need, when you need it.  Thank you for choosing [facility] at [facility]!!     Happy  Holidays!!

## 2019-10-26 ENCOUNTER — Other Ambulatory Visit: Payer: Self-pay

## 2019-10-26 LAB — BASIC METABOLIC PANEL
BUN/Creatinine Ratio: 17 (ref 10–24)
BUN: 18 mg/dL (ref 8–27)
CO2: 25 mmol/L (ref 20–29)
Calcium: 9.7 mg/dL (ref 8.6–10.2)
Chloride: 105 mmol/L (ref 96–106)
Creatinine, Ser: 1.09 mg/dL (ref 0.76–1.27)
GFR calc Af Amer: 77 mL/min/{1.73_m2} (ref >59)
GFR calc non Af Amer: 67 mL/min/{1.73_m2} (ref >59)
Glucose: 95 mg/dL (ref 65–99)
Potassium: 4.6 mmol/L (ref 3.5–5.2)
Sodium: 142 mmol/L (ref 134–144)

## 2019-10-26 LAB — LDL CHOLESTEROL, DIRECT: LDL Direct: 167 mg/dL — ABNORMAL HIGH (ref 0–99)

## 2019-10-26 MED ORDER — ROSUVASTATIN CALCIUM 40 MG PO TABS: 40.0000 mg | ORAL_TABLET | Freq: Every day | ORAL | 3 refills | Status: DC

## 2019-11-09 ENCOUNTER — Telehealth (HOSPITAL_COMMUNITY): Payer: Self-pay

## 2019-11-09 ENCOUNTER — Other Ambulatory Visit: Payer: Self-pay

## 2019-11-09 DIAGNOSIS — I739 Peripheral vascular disease, unspecified: Secondary | ICD-10-CM

## 2019-11-09 NOTE — Telephone Encounter (Signed)

## 2019-11-10 ENCOUNTER — Ambulatory Visit (INDEPENDENT_AMBULATORY_CARE_PROVIDER_SITE_OTHER): Payer: HMO | Admitting: Family

## 2019-11-10 ENCOUNTER — Other Ambulatory Visit: Payer: Self-pay

## 2019-11-10 ENCOUNTER — Ambulatory Visit (HOSPITAL_COMMUNITY)
Admission: RE | Admit: 2019-11-10 | Discharge: 2019-11-10 | Disposition: A | Discharge status: Home / Self Care | Payer: HMO | Source: Ambulatory Visit | Attending: Family | Admitting: Family

## 2019-11-10 ENCOUNTER — Encounter: Payer: Self-pay | Admitting: Family

## 2019-11-10 ENCOUNTER — Ambulatory Visit (INDEPENDENT_AMBULATORY_CARE_PROVIDER_SITE_OTHER)
Admission: RE | Admit: 2019-11-10 | Discharge: 2019-11-10 | Disposition: A | Discharge status: Home / Self Care | Payer: HMO | Source: Ambulatory Visit | Attending: Family | Admitting: Family

## 2019-11-10 ENCOUNTER — Telehealth: Payer: Self-pay | Admitting: *Deleted

## 2019-11-10 VITALS — BP 144/64 | HR 58 | Temp 97.2°F | Resp 16 | Ht 65.0 in | Wt 165.0 lb

## 2019-11-10 DIAGNOSIS — I739 Peripheral vascular disease, unspecified: Secondary | ICD-10-CM

## 2019-11-10 NOTE — Patient Instructions (Signed)
Peripheral Vascular Disease  Peripheral vascular disease (PVD) is a disease of the blood vessels that are not part of your heart and brain. A simple term for PVD is poor circulation. In most cases, PVD narrows the blood vessels that carry blood from your heart to the rest of your body. This can reduce the supply of blood to your arms, legs, and internal organs, like your stomach or kidneys. However, PVD most often affects a person's lower legs and feet. Without treatment, PVD tends to get worse. PVD can also lead to acute ischemic limb. This is when an arm or leg suddenly cannot get enough blood. This is a medical emergency. Follow these instructions at home: Lifestyle  Do not use any products that contain nicotine or tobacco, such as cigarettes and e-cigarettes. If you need help quitting, ask your doctor.  Lose weight if you are overweight. Or, stay at a healthy weight as told by your doctor.  Eat a diet that is low in fat and cholesterol. If you need help, ask your doctor.  Exercise regularly. Ask your doctor for activities that are right for you. General instructions  Take over-the-counter and prescription medicines only as told by your doctor.  Take good care of your feet: ? Wear comfortable shoes that fit well. ? Check your feet often for any cuts or sores.  Keep all follow-up visits as told by your doctor This is important. Contact a doctor if:  You have cramps in your legs when you walk.  You have leg pain when you are at rest.  You have coldness in a leg or foot.  Your skin changes.  You are unable to get or have an erection (erectile dysfunction).  You have cuts or sores on your feet that do not heal. Get help right away if:  Your arm or leg turns cold, numb, and blue.  Your arms or legs become red, warm, swollen, painful, or numb.  You have chest pain.  You have trouble breathing.  You suddenly have weakness in your face, arm, or leg.  You become very  confused or you cannot speak.  You suddenly have a very bad headache.  You suddenly cannot see. Summary  Peripheral vascular disease (PVD) is a disease of the blood vessels.  A simple term for PVD is poor circulation. Without treatment, PVD tends to get worse.  Treatment may include exercise, low fat and low cholesterol diet, and quitting smoking. This information is not intended to replace advice given to you by your health care provider. Make sure you discuss any questions you have with your health care provider. Document Revised: 10/01/2017 Document Reviewed: 11/26/2016 Elsevier Patient Education  2020 [employer]  

## 2019-11-10 NOTE — Telephone Encounter (Signed)
Virtual Visit Pre-Appointment Phone Call  Today, I spoke with Harold Waters and performed the following actions:  1. I explained that we are currently trying to limit exposure to the COVID-19 virus by seeing patients at home rather than in the office.  I explained that the visits are best done by video, but can be done by telephone.  I asked the patient if a virtual visit that the patient would like to try instead of coming into the office. Harold Waters agreed to proceed with the virtual visit scheduled with Harold Waters on 11/10/19.    2. I confirmed the BEST phone number to call the day of the visit and- I included this in appointment notes.  3. I asked if the patient had access to (through a family member/friend) a smartphone with video capability to be used for his visit?"  The patient said yes -     I confirmed consent by  a. sending through Harold Waters or by email the Harold Waters as written at the end of this message or  b. verbally as listed below. i. This visit is being performed in the setting of COVID-19. ii. All virtual visits are billed to your insurance company just like a normal visit would be.   iii. We'd like you to understand that the technology does not allow for your provider to perform an examination, and thus may limit your provider's ability to fully assess your condition.  iv. If your provider identifies any concerns that need to be evaluated in person, we will make arrangements to do so.   v. Finally, though the technology is pretty good, we cannot assure that it will always work on either your or our end, and in the setting of a video visit, we may have to convert it to a phone-only visit.  In either situation, we cannot ensure that we have a secure connection.   vi. Are you willing to proceed?"  STAFF: Did the patient verbally acknowledge consent to telehealth visit? Document YES/NO here: YES  2. I advised the patient to be prepared  - I asked that the patient, on the day of his visit, record any information possible with the equipment at his home, such as blood pressure, pulse, oxygen saturation, and your weight and write them all down. I asked the patient to have a pen and paper handy nearby the day of the visit as well.  3. If the patient was scheduled for a video visit, I informed the patient that the visit with the doctor would start with a text to the smartphone # given to [place] by the patient.         If the patient was scheduled for a telephone call, I informed the patient that the visit with the doctor would start with a call to the telephone # given to [place] by the patient.  4. I Informed patient they will receive a phone call 15 minutes prior to their appointment time from a Harold Waters or nurse to review medications, allergies, etc. to prepare for the visit.    TELEPHONE CALL NOTE  Harold Waters has been deemed a candidate for a follow-up tele-health visit to limit community exposure during the Covid-19 pandemic. I spoke with the patient via phone to ensure availability of phone/video source, confirm preferred email & phone number, and discuss instructions and expectations.  I reminded Harold Waters to be prepared with any vital sign and/or heart  rhythm information that could potentially be obtained via home monitoring, at the time of his visit. I reminded Harold Waters to expect a phone call prior to his visit.  Harold Waters, Harold Waters 11/10/2019 11:45 AM     FULL LENGTH CONSENT FOR TELE-HEALTH VISIT   I hereby voluntarily request, consent and authorize [facility] and its employed or contracted physicians, physician assistants, nurse practitioners or other licensed health care professionals (the Practitioner), to provide me with telemedicine health care services (the "Services") as deemed necessary by the treating Practitioner. I acknowledge and consent to receive the Services by the Practitioner via telemedicine. I  understand that the telemedicine visit will involve communicating with the Practitioner through live audiovisual communication technology and the disclosure of certain medical information by electronic transmission. I acknowledge that I have been given the opportunity to request an in-person assessment or other available alternative prior to the telemedicine visit and am voluntarily participating in the telemedicine visit.  I understand that I have the right to withhold or withdraw my consent to the use of telemedicine in the course of my care at any time, without affecting my right to future care or treatment, and that the Practitioner or I may terminate the telemedicine visit at any time. I understand that I have the right to inspect all information obtained and/or recorded in the course of the telemedicine visit and may receive copies of available information for a reasonable fee.  I understand that some of the potential risks of receiving the Services via telemedicine include:  [name] Delay or interruption in medical evaluation due to technological equipment failure or disruption; . Information transmitted may not be sufficient (e.g. poor resolution of images) to allow for appropriate medical decision making by the Practitioner; and/or  . In rare instances, security protocols could fail, causing a breach of personal health information.  Furthermore, I acknowledge that it is my responsibility to provide information about my medical history, conditions and care that is complete and accurate to the best of my ability. I acknowledge that Practitioner's advice, recommendations, and/or decision may be based on factors not within their control, such as incomplete or inaccurate data provided by me or distortions of diagnostic images or specimens that may result from electronic transmissions. I understand that the practice of medicine is not an exact science and that Practitioner makes no warranties or guarantees  regarding treatment outcomes. I acknowledge that I will receive a copy of this consent concurrently upon execution via email to the email address I last provided but may also request a printed copy by calling the office of [facility].    I understand that my insurance will be billed for this visit.   I have read or had this consent read to me. . I understand the contents of this consent, which adequately explains the benefits and risks of the Services being provided via telemedicine.  . I have been provided ample opportunity to ask questions regarding this consent and the Services and have had my questions answered to my satisfaction. . I give my informed consent for the services to be provided through the use of telemedicine in my medical care  By participating in this telemedicine visit I agree to the above.

## 2019-11-10 NOTE — Progress Notes (Signed)
Virtual Visit via Telephone Note   I was not able to connect with Harold Waters A [place] on 11/10/2019 using the [employer] by telephone.    PCP: Cassandria Anger, MD  Chief Complaint: Follow up PAD  History of Present Illness: Harold Waters is a 74 y.o. male who is s/p left common femoral to above-knee popliteal artery bypass with ipsilateral reversed greater saphenous vein on 12-06-18 by Dr. Donzetta Matters. Pt has a history of life limiting claudication.  He had previous stents of his SFA that were now occluded.  Dr. Donzetta Matters last evaluated pt on 08-04-19. At that time vein demonstrated some intragraft stenosis with downstream velocity 38 cm/s.  He was asymptomatic with palpable pedal pulse that day. If pt has persistent low velocities distally or worsens will certainly need angiography possible invention on the vein graft.  Dr. Donzetta Matters discussed possible outcomes with pt at that visit. Pt was to follow-up in 3 months.  Pt returned today for 3 months follow up of his PAD, non invasive studies done, this is a virtual visit.  However, I was not able to reach pt at [phone], voicemail box is full. I was not able to reach his daughter, Zebedee Iba, at [phone]; I left a message that his test results are good, and he should follow up in 6 months, call sooner should he develop problems with the circulation in his lower extremities.    Past Medical History:  Diagnosis Date  . Angina   . Chronic stomach ulcer    "get them off and on" (09/19/2018)  . Coronary atherosclerosis of unspecified type of vessel, native or graft   . Diverticulosis   . DVT of lower extremity (deep venous thrombosis) ([place]) ~ 2010   LLE  . Erosive gastritis   . Essential hypertension, benign   . GERD (gastroesophageal reflux disease)   . Headache(784.0) 02/18/12   "lately"  . History of kidney stones   . Internal hemorrhoids   . Lower back pain   . Myocardial infarction ([place]) 1997  . Osteoarthritis   . Other and unspecified  hyperlipidemia   . PAD (peripheral artery disease) ([facility])    with ABI's 0.8 on the right and 0.86 on the left  . Pneumonia 1957  . PSVT (paroxysmal supraventricular tachycardia) ([place]) 02/18/12  . Shortness of breath    "lying down"  . Sleep apnea    does not wear c-pap; pt does not recall this hx on 09/19/2018    Past Surgical History:  Procedure Laterality Date  . ARTHROSCOPY KNEE W/ DRILLING Right ~ 2001  . BYPASS GRAFT Left 12/05/2018   femoral popliteal   . CARDIAC CATHETERIZATION  09/19/2018  . COLONOSCOPY    . CORONARY ANGIOPLASTY WITH STENT PLACEMENT  1997   "2"  . FEMORAL-POPLITEAL BYPASS GRAFT Left 12/06/2018   Procedure: BYPASS GRAFT FEMORAL-POPLITEAL ARTERY;  Surgeon: Waynetta Sandy, MD;  Location: [facility];  Service: Vascular;  Laterality: Left;  . ILIAC ARTERY STENT  04/22/2017   . Placement of a 6 mm x 100 mm Viabahn covered stent left SFA  . INTRAVASCULAR PRESSURE WIRE/FFR STUDY  09/19/2018  . INTRAVASCULAR PRESSURE WIRE/FFR STUDY N/A 09/19/2018   Procedure: INTRAVASCULAR PRESSURE WIRE/FFR STUDY;  Surgeon: [name], MD;  Location: [facility];  Service: Cardiovascular;  Laterality: N/A;  . LEFT HEART CATH AND CORONARY ANGIOGRAPHY N/A 09/19/2018   Procedure: LEFT HEART CATH AND CORONARY ANGIOGRAPHY;  Surgeon: [name], MD;  Location: [facility];  Service: Cardiovascular;  Laterality: N/A;  . LEFT HEART CATHETERIZATION WITH CORONARY ANGIOGRAM N/A 02/19/2012   Procedure: LEFT HEART CATHETERIZATION WITH CORONARY ANGIOGRAM;  Surgeon: Larey Dresser, MD;  Location: [facility] C[facility];  Service: Cardiovascular;  Laterality: N/A;  . LOWER EXTREMITY ANGIOGRAPHY Bilateral 04/22/2017   Procedure: Lower Extremity Angiography;  Surgeon: [name], MD;  Location: [facility];  Service: Cardiovascular;  Laterality: Bilateral;  . LOWER EXTREMITY ANGIOGRAPHY Bilateral 09/19/2018  . LOWER EXTREMITY ANGIOGRAPHY Bilateral 09/19/2018    Procedure: LOWER EXTREMITY ANGIOGRAPHY;  Surgeon: [name], MD;  Location: [facility];  Service: Cardiovascular;  Laterality: Bilateral;  . PERIPHERAL ARTERIAL STENT GRAFT  09/2010   LLE  . PERIPHERAL VASCULAR ATHERECTOMY Left 04/22/2017   Procedure: Peripheral Vascular Atherectomy;  Surgeon: [name], MD;  Location: [facility];  Service: Cardiovascular;  Laterality: Left;  SFA  . PERIPHERAL VASCULAR INTERVENTION Left 04/22/2017   Procedure: Peripheral Vascular Intervention;  Surgeon: [name], MD;  Location: [facility];  Service: Cardiovascular;  Laterality: Left;  SFA  . UPPER GASTROINTESTINAL ENDOSCOPY     Social History   Socioeconomic History  . Marital status: Married    Spouse name: Not on file  . Number of children: 2  . Years of education: Not on file  . Highest education level: Not on file  Occupational History  . Occupation: retired  Tobacco Use  . Smoking status: Former Smoker    Packs/day: 1.00    Years: 40.00    Pack years: 40.00    Types: Cigarettes    Quit date: 04/03/2010    Years since quitting: 9.6  . Smokeless tobacco: Never Used  Substance and Sexual Activity  . Alcohol use: Not Currently    Comment: "used to drink when I was young; quit ~ 1980's"  . Drug use: Never  . Sexual activity: Yes  Other Topics Concern  . Not on file  Social History Narrative   Right handed   Two story home   4 children   Social Determinants of Health   Financial Resource Strain: Medium Risk  . Difficulty of Paying Living Expenses: Somewhat hard  Food Insecurity: Food Insecurity Present  . Worried About Charity fundraiser in the Last Year: Sometimes true  . Ran Out of Food in the Last Year: Sometimes true  Transportation Needs: No Transportation Needs  . Lack of Transportation (Medical): No  . Lack of Transportation (Non-Medical): No  Physical Activity: Insufficiently Active  . Days of Exercise per Week: 3 days  . Minutes  of Exercise per Session: 30 min  Stress: No Stress Concern Present  . Feeling of Stress : Only a little  Social Connections: Slightly Isolated  . Frequency of Communication with Friends and Family: More than three times a week  . Frequency of Social Gatherings with Friends and Family: Twice a week  . Attends Religious Services: 1 to 4 times per year  . Active Member of Clubs or Organizations: No  . Attends Archivist Meetings: Never  . Marital Status: Married  Human resources officer Violence: Not At Risk  . Fear of Current or Ex-Partner: No  . Emotionally Abused: No  . Physically Abused: No  . Sexually Abused: No     Current Meds  Medication Sig  . aspirin EC 81 MG tablet Take 81 mg by mouth daily.  . Blood Glucose Monitoring Suppl (ONETOUCH VERIO) w/Device KIT 1  Units by Does not apply route daily as needed.  . Cholecalciferol (VITAMIN D3) 50 MCG (2000 UT) capsule Take 1 capsule (2,000 Units total) by mouth daily.  . clopidogrel (PLAVIX) 75 MG tablet Take 75 mg by mouth daily.  [name] donepezil (ARICEPT) 10 MG tablet Take 1/2 tablet daily for 2 weeks, then increase to 1 tablet daily  . glucose blood (ONETOUCH VERIO) test strip Use to check blood sugar daily  . isosorbide mononitrate (IMDUR) 120 MG 24 hr tablet Take 1 tablet (120 mg total) by mouth daily.  . Multiple Vitamins-Minerals (MENS MULTIVITAMIN PLUS) TABS Take 1 tablet by mouth at bedtime.   . nitroGLYCERIN (NITROSTAT) 0.4 MG SL tablet Place 1 tablet under tongue every 5 mins. DO NOT USE MORE THAN 3 TABS  . rosuvastatin (CRESTOR) 40 MG tablet Take 1 tablet (40 mg total) by mouth daily.  . traZODone (DESYREL) 50 MG tablet TAKE 2 TABLETS BY MOUTH EVERY DAY AT BEDTIME      Observations/Objective:  DATA  Left LE Arterial Duplex (11-10-19): Left Graft #1: +--------------------+--------+--------+---------+--------+                     PSV cm/sStenosisWaveform  Comments +--------------------+--------+--------+---------+--------+ Inflow              137             biphasic          +--------------------+--------+--------+---------+--------+ Proximal Anastomosis111             biphasic          +--------------------+--------+--------+---------+--------+ Proximal Graft      141             biphasic          +--------------------+--------+--------+---------+--------+ Mid Graft           56              biphasic          +--------------------+--------+--------+---------+--------+ Distal Graft        49              biphasic          +--------------------+--------+--------+---------+--------+ Distal Anastomosis  75              triphasic         +--------------------+--------+--------+---------+--------+ Outflow             81              biphasic          +--------------------+--------+--------+---------+--------+ Summary: Left: Patent graft with no significant stenosis. No change since previous exam.  ABI Findings: +---------+------------------+-----+---------+--------+ Right    Rt Pressure (mmHg)IndexWaveform Comment  +---------+------------------+-----+---------+--------+ Brachial 137                                      +---------+------------------+-----+---------+--------+ PTA      152               1.10 triphasic         +---------+------------------+-----+---------+--------+ DP       150               1.09 triphasic         +---------+------------------+-----+---------+--------+ Great Toe140               1.01                   +---------+------------------+-----+---------+--------+  +---------+------------------+-----+---------+-------+  Left     Lt Pressure (mmHg)IndexWaveform Comment +---------+------------------+-----+---------+-------+ Brachial 138                                     +---------+------------------+-----+---------+-------+ PTA       158               1.14 triphasic        +---------+------------------+-----+---------+-------+ DP       156               1.13 triphasic        +---------+------------------+-----+---------+-------+ Great Toe122               0.88                  +---------+------------------+-----+---------+-------+  +-------+-----------+-----------+------------+------------+ ABI/TBIToday's ABIToday's TBIPrevious ABIPrevious TBI +-------+-----------+-----------+------------+------------+ Right  1.1        1.01       1.16        0.55         +-------+-----------+-----------+------------+------------+ Left   1.14       0.88       1.1         0.58         +-------+-----------+-----------+------------+------------+ Previous ABI 08/04/19. Bilateral TBIs appear increased.   Summary: Right: Resting right ankle-brachial index is within normal range. The right toe-brachial index is normal. RT great toe pressure = 140 mmHg. Left: Resting left ankle-brachial index is within normal range. The left toe-brachial index is norma. LT Great toe pressure = 122 mmHg.     Assessment and Plan: CHIP CANEPA is a 74 y.o. male who is s/p left common femoral to above-knee popliteal artery bypass with ipsilateral reversed greater saphenous vein on 12-06-18 by Dr. Donzetta Matters. Pt has a history of life limiting claudication.  He had previous stents of his SFA that were now occluded.   Today's left lower extremity arterial duplex shows a patent graft with no significant stenosis, no change since previous exam. ABI's today show normal arterial perfusion in both legs with all triphasic waveforms, normal TBI's.   He is a former smoker.   He takes a daily 81 mg ASA, Plavix, and a statin.    Follow Up Instructions:   Follow up 6 months with left LE arterial duplex and ABI's.    I discussed the assessment and treatment plan with the patient. The patient was provided an opportunity to ask  questions and all were answered. The patient agreed with the plan and demonstrated an understanding of the instructions.   The patient was advised to call back or seek an in-person evaluation if the symptoms worsen or if the condition fails to improve as anticipated.  I spent 0 minutes with the patient via telephone encounter.   Gabrielle Dare [facility] Office: [phone]  11/10/2019, 11:42 AM

## 2019-11-14 ENCOUNTER — Other Ambulatory Visit: Payer: Self-pay | Admitting: *Deleted

## 2019-11-14 DIAGNOSIS — I739 Peripheral vascular disease, unspecified: Secondary | ICD-10-CM

## 2019-11-23 DIAGNOSIS — Z03818 Encounter for observation for suspected exposure to other biological agents ruled out: Secondary | ICD-10-CM | POA: Diagnosis not present

## 2019-11-23 DIAGNOSIS — Z20822 Contact with and (suspected) exposure to covid-19: Secondary | ICD-10-CM | POA: Diagnosis not present

## 2019-12-01 ENCOUNTER — Ambulatory Visit: Payer: HMO

## 2019-12-06 ENCOUNTER — Telehealth: Payer: Self-pay

## 2019-12-06 NOTE — Telephone Encounter (Signed)
Jessica with [facility] calling and states that she faxed some paperwork over on 11/28/2019, needing information on whether the patient has diabetes or congestive heart failure. Please advise.  FAX#: [phone] CB#: [phone] 

## 2019-12-06 NOTE — Telephone Encounter (Signed)
Info given 

## 2019-12-09 ENCOUNTER — Ambulatory Visit: Payer: HMO

## 2019-12-12 ENCOUNTER — Ambulatory Visit: Payer: HMO

## 2020-01-10 ENCOUNTER — Other Ambulatory Visit: Payer: Self-pay | Admitting: General Practice

## 2020-01-10 NOTE — Patient Outreach (Signed)
Client is newly enrolled in the Special Needs Plan program with Type II Diabetes. Recent Hgb A1C is elevated. No Health Risk Assessment on file, Individualized Care Plan (ICP) completed from information in the EMR. Client also has a history of Heart Disease, Peripheral Artery Disease with a popliteal artery bypass completed in 12/2018 and short team rehab in a SNF. At that time, several SDOH concerns were raised. Will send introductory letter with ICP to the primary provider and client, along with educational materials. Assigned RN Care Coordinator will follow up in 3 months to assess and follow up on SDOH needs.

## 2020-01-16 ENCOUNTER — Other Ambulatory Visit: Payer: Self-pay | Admitting: General Practice

## 2020-03-05 ENCOUNTER — Other Ambulatory Visit: Payer: Self-pay | Admitting: *Deleted

## 2020-03-05 ENCOUNTER — Encounter: Payer: Self-pay | Admitting: *Deleted

## 2020-03-05 NOTE — Patient Outreach (Signed)
[facility]) Care Management Chronic Special Needs Program  03/05/2020  Name: Harold Waters DOB: 03-29-1946  MRN: [id number]  Mr. Kylenn Dozier is enrolled in a chronic special needs plan for Diabetes. Chronic Care Management Coordinator telephoned client to review health risk assessment and to develop individualized care plan.  Introduced the chronic care management program, importance of client participation, and taking their care plan to all provider appointments and inpatient facilities.  Reviewed the transition of care process and possible referral to community care management.  Subjective: Spoke with daughter Harold Waters (permission given per medical record), Harold Waters reports client lives with his spouse only and adult children assist as needed. Client also has history of heart disease, MI, HTN and memory issues. Client is overall independent and still drives, oversees his own medication so Harold Waters is not sure if client is taking medication as he should but she will check into this with client.  Client states on Health Risk Assessment he cannot afford some of his medications, daughter feels like this may pertain to last year and not the present, she will discuss with client and call RN care manager if any pharmacy assistance is needed.  Client does not check CBG daily, maybe several times per week with most readings in low 100's per daughter, RN care manager stressed importance of checking CBG daily and recording.  Client may be interested in continuous glucose monitoring and daughter will discuss with client and speak with doctor if client is interested.  Client walks several times weekly and is trying to stay active outdoors, is concerned because his memory is getting worse, client is on aricept and plans to discuss any concerns with his doctor. RN care manager gave HTA conciegre number to daughter for any questions regarding benefits or resources.  RN care manager explained there are  resources such as social work if the need arises, daughter verbalizes understanding and will call RN care manager for any questions or concerns.  RN care manager reminded daughter of 24 hour nurse advice line as a resource.  Goals Addressed            This Visit's Progress   .  Acknowledge receipt of Building surveyor mailed client Advanced Directives packet. RN care manager mailed EMMI education article " Advanced Directives"     . "concerned about more recent memory loss, goal is to get healthy" (pt-stated)       Please talk with your doctor about any health related concerns, including memory issues. RN care manager mailed EMMI education article "How to help your memory" Please take aricept and all medications as prescribed Continue to do your walking- keep up the good work    . Client understands the importance of follow-up with providers by attending scheduled visits   On track    Review of medical record indicates client has completed  3 office visits with primary care provider in 2020 Call and schedule a yearly physical and follow up visit as recommended by your health care provider.     . Client verbalizes knowledge of Heart Attack self management skills by 9-12 months       Please call 911 for any symptoms of a heart attack.  Symptoms may include chest pain, pressure or tightness, pain that radiates to your jaw, neck or back, shortness of breath, nausea, indigestion, lightheadedness or sudden dizziness. Follow a heart healthy diet (rich in fruits, vegetables, lean proteins and low  in salt).  Talk with your doctor about exercising. Take medications as prescribed. Follow up with your health care provider as recommended. Please check and make sure nitroglycerin is not expired    . Client will report no worsening of symptoms related to heart disease within the next 12 months       Lifestyle changes can help you prevent or slow the progression of  heart disease. Control your blood pressure Have your cholesterol checked Avoid or limit alcohol Maintain a healthy weight Manage stress and get adequate sleep Eat heart healthy foods such as fresh fruits and vegetables, whole grains with fiber, lean proteins and low in salt Talk to your health care provider about an exercise program     . Client will verbalize knowledge of self management of Hypertension as evidences by BP reading of 140/90 or less; or as defined by provider       Take blood pressure medications as prescribed. Plan to check blood pressure regularly and take results to your health care provider appointments. Plan to follow a low salt diet. Increase activity as tolerated. Follow up with your health care provider as recommended. Please ask your health care provider " what is my target blood pressure range". Blood pressure 144/64 on 11/10/19    . HEMOGLOBIN A1C < 7.0       Per medical record review Hgb AIC completed on 12/29/18  8.2 Diabetes self management actions as follows- Continue to keep your follow up appointments with your provider and have lab work completed as recommended. Monitor glucose (blood sugar) per health care provider recommendation. Check feet daily Visit health care provider every 3-6 months as directed. It is important to have your Hgb AIC checked every 3-6 months (every 6 months if you are at goal and every 3 months if you are not at goal). Eye exam yearly Carbohydrate controlled meal planning Take diabetes medications as prescribed by health care provider Physical activity RN care manager mailed EMMI education article "Diabetes- why check your blood sugar"    . Maintain timely refills of diabetic medication as prescribed within the year .       Take medications as prescribed. Follow up with your health care provider if you have any questions. Please contact your assigned RN care manager if you have difficulty obtaining medications.      .  Obtain annual  Lipid Profile, LDL-C       Per medical record review, Lipid profile completed on 10/25/19 LDL= 167 The goal for LDL is less than 70mg /dl as you are at high risk for complications. Try to avoid saturated fats, trans-fats and eat more fiber. Continue to follow up with your health care provider for any needed lab work.     Illa Level Annual Eye (retinal)  Exam    On track    Per medical record review, client had eye exam on August 2020 Plan to have dilated eye exam every year Plan to schedule your eye exam yearly     . Obtain Annual Foot Exam       Unable to determine when foot exam completed from medical record review Your doctor should check your feet at least once a year. Plan to schedule a foot exam with your health care provider once every year. Check your skin and feet daily for cuts, bruises, redness, blisters or sore.     . Obtain annual screen for micro albuminuria (urine) , nephropathy (kidney problems)       Per  medical record review, unable to determine when microalbuminuria completed Continue to obtain yearly physicals and lab checks as recommended by your health care provider. It is important for your health care provider to check your urine for protein at least once every year.     Illa Level Hemoglobin A1C at least 2 times per year       Hgb AIC completed on 12/29/18 Continue to follow up with your doctor for any needed/ recommended lab work    . Visit Primary Care Provider or Endocrinologist at least 2 times per year        Client saw primary care provider 3 times in 2020 Continue to follow up with your doctor as needed       PLAN:  RN care manager faxed today's note with updated individualized care plan to primary care provider, mailed updated individualized care plan to client's home along with EMMI education articles, consent form, HTA calendar, Advanced directives packet. Daughter will contact RN care manager if there are any issues with client  affording medication.  Chronic care management coordination will outreach in:  9-12 months   Kassie Mends Nursing/RN [place] Case Manager, C-SNP  [phone]

## 2020-03-29 ENCOUNTER — Other Ambulatory Visit: Payer: Self-pay

## 2020-03-29 ENCOUNTER — Ambulatory Visit: Payer: PPO | Admitting: Neurology

## 2020-03-29 ENCOUNTER — Encounter: Payer: Self-pay | Admitting: Neurology

## 2020-03-29 ENCOUNTER — Ambulatory Visit: Payer: HMO | Admitting: Neurology

## 2020-03-29 VITALS — BP 154/79 | HR 78 | Ht 65.0 in | Wt 169.8 lb

## 2020-03-29 DIAGNOSIS — F0150 Vascular dementia without behavioral disturbance: Secondary | ICD-10-CM | POA: Diagnosis not present

## 2020-03-29 MED ORDER — DONEPEZIL HCL 10 MG PO TABS: ORAL_TABLET | ORAL | 3 refills | Status: DC

## 2020-03-29 NOTE — Progress Notes (Signed)
NEUROLOGY FOLLOW UP OFFICE NOTE  Harold Waters [id number] 10-22-1946  HISTORY OF PRESENT ILLNESS: I had the pleasure of seeing Harold Waters in follow-up in the neurology clinic on 03/29/2020.  The patient was last seen 7 months ago for mild dementia. He is alone in the office today. Records and images were personally reviewed where available.  I personally reviewed MRI brain without contrast done 09/2019 which did not show any acute changes, there was mild diffuse atrophy and moderate chronic microvascular disease, chronic small strokes in the left occipital lobe, bilateral cerebellar hemispheres and thalami. Since his last visit, he feels memory is about the same. He gets lost very easily, able to find his way but getting confused when driving. He has missed bill payments, he made deposits today but cannot recall what he did, he cannot find his checks and hopes they are in the house. He is doing better with autopay. He lives with his wife. Sometimes when at home, he does not feel like he is at home, like he is in some property. No hallucinations. He used to be good at math but now he cannot do it. He forgets his medications even with a pillbox. He took Donepezil for a month but did not get refills. He denies any headaches, dizziness, vision changes, no falls. Sleeping is better without the Trazodone.    History on Initial Assessment 08/23/2019: This is a pleasant 74 year old right-handed man with a history of hypertension, hyperlipidemia, PAD, presenting for evaluation of memory loss. He feels that memory changes started soon after his surgery in February 2020 where he had a left femoral to AK popliteal artery bypass under general anesthesia.  Prior to this, he had minor memory changes, but soon after surgery he noticed that he was unable to do things that he could do easily in the past such as math. He used to do large numbers in the back of his head, but has more difficulty now. He lives with his  wife who constantly says something about his memory saying "you forget everything," which upsets him since this was not happening prior to the surgery. He denies getting lost driving but has missed his exit a few times because he is not focusing or forgets where he is going. He has forgotten bill payments and has had late charges (new as well). He has been forgetting his medications, his wife now helps him. He loses things constantly, his phone is lost all the time. He has put things for the pantry in the freezer. He denies leaving the stove on. He has word-finding difficulties and forgets names more. 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 He has also had a change in sleep pattern, he has always had sleep difficulties getting an average of 4 hours of sleep, but since the surgery he would only get 2-3 hours of sleep. With Trazodone 1 tab qhs he gets 4 hours. He has daytime drowsiness and would drag his feet because he is so tired. Mood is good. No family history of dementia, no history of significant head injuries or alcohol use.   He gets dizzy when walking or changing clothes, no falls. He recalls an episode of double vision 3-4 years ago, this has resolved. He has occasional difficulty swallowing water. He has some numbness in both hands. He denies any headaches,  focal numbness/tingling/weakness, bowel/bladder dysfunction, anosmia, or tremors. He denies any prior history of stroke, head CT done in 07/2019 showed an old left posterior parietal infarct and old left superior cerebellar lacunar infarcts, mild chronic microvascular disease, no acute change.   Laboratory Data: Lab Results  Component Value Date   TSH 4.91 (H) 07/03/2019   Free T4 0.77  Lab Results  Component Value Date   [id number] 030 07/03/2019    PAST MEDICAL HISTORY: Past Medical History:  Diagnosis Date   . Angina   . Chronic stomach ulcer    "get them off and on" (09/19/2018)  . Coronary atherosclerosis of unspecified type of vessel, native or graft   . Diverticulosis   . DVT of lower extremity (deep venous thrombosis) ([place]) ~ 2010   LLE  . Erosive gastritis   . Essential hypertension, benign   . GERD (gastroesophageal reflux disease)   . Headache(784.0) 02/18/12   "lately"  . History of kidney stones   . Internal hemorrhoids   . Lower back pain   . Myocardial infarction ([place]) 1997  . Osteoarthritis   . Other and unspecified hyperlipidemia   . PAD (peripheral artery disease) ([facility])    with ABI's 0.8 on the right and 0.86 on the left  . Pneumonia 1957  . PSVT (paroxysmal supraventricular tachycardia) ([place]) 02/18/12  . Shortness of breath    "lying down"  . Sleep apnea    does not wear c-pap; pt does not recall this hx on 09/19/2018    MEDICATIONS: Current Outpatient Medications on File Prior to Visit  Medication Sig Dispense Refill  . aspirin EC 81 MG tablet Take 81 mg by mouth daily.    . Blood Glucose Monitoring Suppl (ONETOUCH VERIO) w/Device KIT 1 Units by Does not apply route daily as needed. 1 kit 1  . Cholecalciferol (VITAMIN D3) 50 MCG (2000 UT) capsule Take 1 capsule (2,000 Units total) by mouth daily. 100 capsule 3  . clopidogrel (PLAVIX) 75 MG tablet Take 75 mg by mouth daily.    [name] glucose blood (ONETOUCH VERIO) test strip Use to check blood sugar daily 50 each 11  . isosorbide mononitrate (IMDUR) 120 MG 24 hr tablet TAKE 1 TABLET BY MOUTH EVERY DAY 90 tablet 1  . Multiple Vitamins-Minerals (MENS MULTIVITAMIN PLUS) TABS Take 1 tablet by mouth at bedtime.     . nitroGLYCERIN (NITROSTAT) 0.4 MG SL tablet Place 1 tablet under tongue every 5 mins. DO NOT USE MORE THAN 3 TABS 25 tablet 1  . rosuvastatin (CRESTOR) 40 MG tablet Take 1 tablet (40 mg total) by mouth daily. 30 tablet 3  . donepezil (ARICEPT) 10 MG tablet Take 1/2 tablet daily for 2 weeks, then increase to 1  tablet daily (Patient not taking: Reported on 03/29/2020) 30 tablet 11   No current facility-administered medications on file prior to visit.    ALLERGIES: Allergies  Allergen Reactions  . Eggs Or Egg-Derived Products Itching  . Coreg [Carvedilol]     fatigue    FAMILY HISTORY: Family History  Problem Relation Age of Onset  . Ulcers Father        had stomach issues, not sure what happened  . Coronary artery disease Brother        male 1st degree relative <50  . Colon cancer Neg Hx   . Esophageal cancer Neg Hx   . Rectal cancer Neg Hx   . Stomach cancer Neg Hx     SOCIAL HISTORY: Social History  Socioeconomic History  . Marital status: Married    Spouse name: Not on file  . Number of children: 2  . Years of education: Not on file  . Highest education level: Not on file  Occupational History  . Occupation: retired  Tobacco Use  . Smoking status: Former Smoker    Packs/day: 1.00    Years: 40.00    Pack years: 40.00    Types: Cigarettes    Quit date: 04/03/2010    Years since quitting: 9.9  . Smokeless tobacco: Never Used  Substance and Sexual Activity  . Alcohol use: Not Currently    Comment: "used to drink when I was young; quit ~ 1980's"  . Drug use: Never  . Sexual activity: Yes  Other Topics Concern  . Not on file  Social History Narrative   Right handed   Two story home   4 children   Social Determinants of Health   Financial Resource Strain:   . Difficulty of Paying Living Expenses:   Food Insecurity: No Food Insecurity  . Worried About Charity fundraiser in the Last Year: Never true  . Ran Out of Food in the Last Year: Never true  Transportation Needs: No Transportation Needs  . Lack of Transportation (Medical): No  . Lack of Transportation (Non-Medical): No  Physical Activity:   . Days of Exercise per Week:   . Minutes of Exercise per Session:   Stress:   . Feeling of Stress :   Social Connections:   . Frequency of Communication with  Friends and Family:   . Frequency of Social Gatherings with Friends and Family:   . Attends Religious Services:   . Active Member of Clubs or Organizations:   . Attends Archivist Meetings:   [name] Marital Status:   Intimate Partner Violence:   . Fear of Current or Ex-Partner:   . Emotionally Abused:   [name] Physically Abused:   . Sexually Abused:     PHYSICAL EXAM: Vitals:   03/29/20 1549  BP: (!) 154/79  Pulse: 78  SpO2: 94%   General: No acute distress Head:  Normocephalic/atraumaticSkin/Extremities: No rash, no edema Neurological Exam: alert and oriented to person, place, and time. No aphasia or dysarthria. Fund of knowledge is appropriate.  Recent and remote memory are impaired.Attention and concentration are normal.    Able to name objects and repeat phrases. MMSE 26/30 MMSE - Mini Mental State Exam 03/29/2020  Orientation to time 5  Orientation to Place 5  Registration 3  Attention/ Calculation 4  Recall 0  Language- name 2 objects 2  Language- repeat 1  Language- follow 3 step command 3  Language- read & follow direction 1  Write a sentence 1  Copy design 1  Total score 26    Cranial nerves: Pupils equal, round, reactive to light.  Extraocular movements intact with no nystagmus. Visual fields full. No facial asymmetry. Motor: Bulk and tone normal, muscle strength 5/5 throughout with no pronator drift.  Finger to nose testing intact.  Gait narrow-based and steady, able to tandem walk adequately.    IMPRESSION: This is a pleasant 74 yo RH man with a history of hypertension, hyperlipidemia, PAD, with worsening memory. His neurological exam is normal, MMSE today 26/30, however he is having more difficulties with complex tasks. MRI brain showed mild diffuse atrophy and moderate chronic microvascular disease, chronic small strokes in the left occipital lobe, bilateral cerebellar hemispheres and thalami. We discussed symptoms concerning for vascular dementia,  including  diagnosis, prognosis, and medication. He was advised to restart Donepezil 6m daily. He was advised to have family in the car each time he drives, and to have them check behind him on medications and finances. We discussed the importance of control of vascular risk factors, physical exercise and brain stimulation exercises for brain health.   Thank you for allowing me to participate in his care.  Please do not hesitate to call for any questions or concerns.   KEllouise Newer M.D.   CC: Dr. PAlain Marion

## 2020-03-29 NOTE — Patient Instructions (Signed)
1. Restart Donepezil 10mg  every morning  2. Recommend having someone in the car with you during driving. Recommend having family more involved with medications and bill payments to ensure nothing is missed  3. Follow-up in 6 months, call for any changes  FALL PRECAUTIONS: Be cautious when walking. Scan the area for obstacles that may increase the risk of trips and falls. When getting up in the mornings, sit up at the edge of the bed for a few minutes before getting out of bed. Consider elevating the bed at the head end to avoid drop of blood pressure when getting up. Walk always in a well-lit room (use night lights in the walls). Avoid area rugs or power cords from appliances in the middle of the walkways. Use a walker or a cane if necessary and consider physical therapy for balance exercise. Get your eyesight checked regularly.  FINANCIAL OVERSIGHT: Supervision, especially oversight when making financial decisions or transactions is also recommended as difficulties arise.  HOME SAFETY: Consider the safety of the kitchen when operating appliances like stoves, microwave oven, and blender. Consider having supervision and share cooking responsibilities until no longer able to participate in those. Accidents with firearms and other hazards in the house should be identified and addressed as well.  DRIVING: Regarding driving, in patients with progressive memory problems, driving will be impaired. We advise to have someone else do the driving if trouble finding directions or if minor accidents are reported. Independent driving assessment is available to determine safety of driving.  ABILITY TO BE LEFT ALONE: If patient is unable to contact 911 operator, consider using LifeLine, or when the need is there, arrange for someone to stay with patients. Smoking is a fire hazard, consider supervision or cessation. Risk of wandering should be assessed by caregiver and if detected at any point, supervision and safe  proof recommendations should be instituted.  MEDICATION SUPERVISION: Inability to self-administer medication needs to be constantly addressed. Implement a mechanism to ensure safe administration of the medications.  RECOMMENDATIONS FOR ALL PATIENTS WITH MEMORY PROBLEMS: 1. Continue to exercise (Recommend 30 minutes of walking everyday, or 3 hours every week) 2. Increase social interactions - continue going to [place] and enjoy social gatherings with friends and family 3. Eat healthy, avoid fried foods and eat more fruits and vegetables 4. Maintain adequate blood pressure, blood sugar, and blood cholesterol level. Reducing the risk of stroke and cardiovascular disease also helps promoting better memory. 5. Avoid stressful situations. Live a simple life and avoid aggravations. Organize your time and prepare for the next day in anticipation. 6. Sleep well, avoid any interruptions of sleep and avoid any distractions in the bedroom that may interfere with adequate sleep quality 7. Avoid sugar, avoid sweets as there is a strong link between excessive sugar intake, diabetes, and cognitive impairment The Mediterranean diet has been shown to help patients reduce the risk of progressive memory disorders and reduces cardiovascular risk. This includes eating fish, eat fruits and green leafy vegetables, nuts like almonds and hazelnuts, walnuts, and also use olive oil. Avoid fast foods and fried foods as much as possible. Avoid sweets and sugar as sugar use has been linked to worsening of memory function.  There is always a concern of gradual progression of memory problems. If this is the case, then we may need to adjust level of care according to patient needs. Support, both to the patient and caregiver, should then be put into place.

## 2020-05-15 ENCOUNTER — Encounter: Payer: Self-pay | Admitting: Pharmacist

## 2020-05-17 ENCOUNTER — Ambulatory Visit (INDEPENDENT_AMBULATORY_CARE_PROVIDER_SITE_OTHER)
Admission: RE | Admit: 2020-05-17 | Discharge: 2020-05-17 | Disposition: A | Discharge status: Home / Self Care | Payer: HMO | Source: Ambulatory Visit | Attending: Vascular Surgery | Admitting: Vascular Surgery

## 2020-05-17 ENCOUNTER — Ambulatory Visit (HOSPITAL_COMMUNITY)
Admission: RE | Admit: 2020-05-17 | Discharge: 2020-05-17 | Disposition: A | Discharge status: Home / Self Care | Payer: HMO | Source: Ambulatory Visit | Attending: Vascular Surgery | Admitting: Vascular Surgery

## 2020-05-17 ENCOUNTER — Other Ambulatory Visit: Payer: Self-pay

## 2020-05-17 DIAGNOSIS — I739 Peripheral vascular disease, unspecified: Secondary | ICD-10-CM

## 2020-05-20 ENCOUNTER — Other Ambulatory Visit: Payer: Self-pay

## 2020-05-20 ENCOUNTER — Ambulatory Visit (INDEPENDENT_AMBULATORY_CARE_PROVIDER_SITE_OTHER): Payer: HMO | Admitting: Physician Assistant

## 2020-05-20 VITALS — BP 162/82 | HR 62 | Temp 97.5°F | Resp 20 | Ht 65.0 in | Wt 168.6 lb

## 2020-05-20 DIAGNOSIS — M7989 Other specified soft tissue disorders: Secondary | ICD-10-CM

## 2020-05-20 DIAGNOSIS — I739 Peripheral vascular disease, unspecified: Secondary | ICD-10-CM | POA: Diagnosis not present

## 2020-05-20 NOTE — Progress Notes (Signed)
Office Note     CC:  follow up Requesting Provider:  Plotnikov, [name], MD  HPI: Harold Waters is a 74 y.o. (02-02-1946) male who presents for follow up of peripheral vascular disease. He is s/p left common femoral to above knee popliteal artery bypass with ipsilateral reversed greater saphenous vein in 12/06/18 by Dr. Donzetta Matters. He is complaining of left lower extremity swelling that often is significant enough that he cannot walk on leg or put his shoe on. He does try elevating it frequently with sometimes minimal improvement. He additionally has numbness at the popliteal incision and states that over past 8 months from the distal incision into his left knee he has been having sharp pains. It will take him a few minutes to continue walking when he has the pain. Also difficult to go from sitting to standing position. Does not last long but happens pretty frequently. He otherwise denies pain directly in is left knee. He has not had any claudication like symptoms in right or left leg, no rest pain or non healing wounds. He stays very active.  The pt is on a statin for cholesterol management.  The pt is on a daily aspirin.   Other AC:  Plavix The pt not on medication for hypertension.   The pt is not diabetic.  Tobacco hx:  Former, quit 2011  Past Medical History:  Diagnosis Date  . Angina   . Chronic stomach ulcer    "get them off and on" (09/19/2018)  . Coronary atherosclerosis of unspecified type of vessel, native or graft   . Diverticulosis   . DVT of lower extremity (deep venous thrombosis) ([place]) ~ 2010   LLE  . Erosive gastritis   . Essential hypertension, benign   . GERD (gastroesophageal reflux disease)   . Headache(784.0) 02/18/12   "lately"  . History of kidney stones   . Internal hemorrhoids   . Lower back pain   . Myocardial infarction ([place]) 1997  . Osteoarthritis   . Other and unspecified hyperlipidemia   . PAD (peripheral artery disease) ([facility])    with ABI's 0.8 on  the right and 0.86 on the left  . Pneumonia 1957  . PSVT (paroxysmal supraventricular tachycardia) ([place]) 02/18/12  . Shortness of breath    "lying down"  . Sleep apnea    does not wear c-pap; pt does not recall this hx on 09/19/2018    Past Surgical History:  Procedure Laterality Date  . ARTHROSCOPY KNEE W/ DRILLING Right ~ 2001  . BYPASS GRAFT Left 12/05/2018   femoral popliteal   . CARDIAC CATHETERIZATION  09/19/2018  . COLONOSCOPY    . CORONARY ANGIOPLASTY WITH STENT PLACEMENT  1997   "2"  . FEMORAL-POPLITEAL BYPASS GRAFT Left 12/06/2018   Procedure: BYPASS GRAFT FEMORAL-POPLITEAL ARTERY;  Surgeon: Waynetta Sandy, MD;  Location: [facility];  Service: Vascular;  Laterality: Left;  . ILIAC ARTERY STENT  04/22/2017   . Placement of a 6 mm x 100 mm Viabahn covered stent left SFA  . INTRAVASCULAR PRESSURE WIRE/FFR STUDY  09/19/2018  . INTRAVASCULAR PRESSURE WIRE/FFR STUDY N/A 09/19/2018   Procedure: INTRAVASCULAR PRESSURE WIRE/FFR STUDY;  Surgeon: [name], MD;  Location: [facility];  Service: Cardiovascular;  Laterality: N/A;  . LEFT HEART CATH AND CORONARY ANGIOGRAPHY N/A 09/19/2018   Procedure: LEFT HEART CATH AND CORONARY ANGIOGRAPHY;  Surgeon: [name], MD;  Location: [facility];  Service: Cardiovascular;  Laterality: N/A;  . LEFT  HEART CATHETERIZATION WITH CORONARY ANGIOGRAM N/A 02/19/2012   Procedure: LEFT HEART CATHETERIZATION WITH CORONARY ANGIOGRAM;  Surgeon: Larey Dresser, MD;  Location: [facility] C[facility];  Service: Cardiovascular;  Laterality: N/A;  . LOWER EXTREMITY ANGIOGRAPHY Bilateral 04/22/2017   Procedure: Lower Extremity Angiography;  Surgeon: [name], MD;  Location: [place] [facility];  Service: Cardiovascular;  Laterality: Bilateral;  . LOWER EXTREMITY ANGIOGRAPHY Bilateral 09/19/2018  . LOWER EXTREMITY ANGIOGRAPHY Bilateral 09/19/2018   Procedure: LOWER EXTREMITY ANGIOGRAPHY;  Surgeon: [name], MD;  Location: [facility] [facility];  Service: Cardiovascular;  Laterality: Bilateral;  . PERIPHERAL ARTERIAL STENT GRAFT  09/2010   LLE  . PERIPHERAL VASCULAR ATHERECTOMY Left 04/22/2017   Procedure: Peripheral Vascular Atherectomy;  Surgeon: [name], MD;  Location: [place] [facility];  Service: Cardiovascular;  Laterality: Left;  SFA  . PERIPHERAL VASCULAR INTERVENTION Left 04/22/2017   Procedure: Peripheral Vascular Intervention;  Surgeon: [name], MD;  Location: [place] [facility];  Service: Cardiovascular;  Laterality: Left;  SFA  . UPPER GASTROINTESTINAL ENDOSCOPY      Social History   Socioeconomic History  . Marital status: Married    Spouse name: Not on file  . Number of children: 2  . Years of education: Not on file  . Highest education level: Not on file  Occupational History  . Occupation: retired  Tobacco Use  . Smoking status: Former Smoker    Packs/day: 1.00    Years: 40.00    Pack years: 40.00    Types: Cigarettes    Quit date: 04/03/2010    Years since quitting: 10.1  . Smokeless tobacco: Never Used  Vaping Use  . Vaping Use: Never used  Substance and Sexual Activity  . Alcohol use: Not Currently    Comment: "used to drink when I was young; quit ~ 1980's"  . Drug use: Never  . Sexual activity: Yes  Other Topics Concern  . Not on file  Social History Narrative   Right handed   Two story home   4 children   Social Determinants of Health   Financial Resource Strain:   . Difficulty of Paying Living Expenses:   Food Insecurity: No Food Insecurity  . Worried About Charity fundraiser in the Last Year: Never true  . Ran Out of Food in the Last Year: Never true  Transportation Needs: No Transportation Needs  . Lack of Transportation (Medical): No  . Lack of Transportation (Non-Medical): No  Physical Activity:   . Days of Exercise per Week:   . Minutes of Exercise per Session:   Stress:   . Feeling of Stress :   Social Connections:   . Frequency of  Communication with Friends and Family:   . Frequency of Social Gatherings with Friends and Family:   . Attends Religious Services:   . Active Member of Clubs or Organizations:   . Attends Archivist Meetings:   [name] Marital Status:   Intimate Partner Violence:   . Fear of Current or Ex-Partner:   . Emotionally Abused:   [name] Physically Abused:   . Sexually Abused:     Family History  Problem Relation Age of Onset  . Ulcers Father        had stomach issues, not sure what happened  . Coronary artery disease Brother        male 1st degree relative <50  . Colon cancer Neg Hx   . Esophageal cancer Neg  Hx   . Rectal cancer Neg Hx   . Stomach cancer Neg Hx     Current Outpatient Medications  Medication Sig Dispense Refill  . aspirin EC 81 MG tablet Take 81 mg by mouth daily.    . Blood Glucose Monitoring Suppl (ONETOUCH VERIO) w/Device KIT 1 Units by Does not apply route daily as needed. 1 kit 1  . Cholecalciferol (VITAMIN D3) 50 MCG (2000 UT) capsule Take 1 capsule (2,000 Units total) by mouth daily. 100 capsule 3  . clopidogrel (PLAVIX) 75 MG tablet Take 75 mg by mouth daily.    [name] donepezil (ARICEPT) 10 MG tablet Take 1 tablet daily 90 tablet 3  . glucose blood (ONETOUCH VERIO) test strip Use to check blood sugar daily 50 each 11  . isosorbide mononitrate (IMDUR) 120 MG 24 hr tablet TAKE 1 TABLET BY MOUTH EVERY DAY 90 tablet 1  . Multiple Vitamins-Minerals (MENS MULTIVITAMIN PLUS) TABS Take 1 tablet by mouth at bedtime.     . nitroGLYCERIN (NITROSTAT) 0.4 MG SL tablet Place 1 tablet under tongue every 5 mins. DO NOT USE MORE THAN 3 TABS 25 tablet 1  . rosuvastatin (CRESTOR) 40 MG tablet Take 1 tablet (40 mg total) by mouth daily. 30 tablet 3   No current facility-administered medications for this visit.    Allergies  Allergen Reactions  . Eggs Or Egg-Derived Products Itching  . Coreg [Carvedilol]     fatigue     REVIEW OF SYSTEMS:  [X]  denotes positive finding, [ ]   denotes negative finding Cardiac  Comments:  Chest pain or chest pressure:    Shortness of breath upon exertion:    Short of breath when lying flat:    Irregular heart rhythm:        Vascular    Pain in calf, thigh, or hip brought on by ambulation:    Pain in feet at night that wakes you up from your sleep:     Blood clot in your veins:    Leg swelling:  X LLE       Pulmonary    Oxygen at home:    Productive cough:     Wheezing:         Neurologic    Sudden weakness in arms or legs:     Sudden numbness in arms or legs:     Sudden onset of difficulty speaking or slurred speech:    Temporary loss of vision in one eye:     Problems with dizziness:         Gastrointestinal    Blood in stool:     Vomited blood:         Genitourinary    Burning when urinating:     Blood in urine:        Psychiatric    Major depression:         Hematologic    Bleeding problems:    Problems with blood clotting too easily:        Skin    Rashes or ulcers:        Constitutional    Fever or chills:      PHYSICAL EXAMINATION:  Vitals:   05/20/20 0901  BP: (!) 162/82  Pulse: 62  Resp: 20  Temp: (!) 97.5 F (36.4 C)  TempSrc: Temporal  SpO2: 95%  Weight: 168 lb 9.6 oz (76.5 kg)  Height: 5' 5"  (1.651 m)    General:  WDWN in NAD; vital signs documented above  Gait: Normal HENT: WNL, normocephalic Pulmonary: normal non-labored breathing , without wheezing Cardiac: regular HR, without  Murmurs without carotid bruit Abdomen: soft, NT, no masses Skin: without rashes Vascular Exam/Pulses:  Right Left  Radial 2+ (normal) 2+ (normal)  Femoral 2+ (normal) 2+ (normal)  Popliteal 2+ (normal) 2+ (normal)  DP 2+ (normal) 2+ (normal)  PT 1+ (weak) 1+ (weak)   Extremities: without ischemic changes, without Gangrene , without cellulitis; without open wounds; left lower leg swelling present. Some large varicose veins in the left medial proximal left leg Musculoskeletal: no muscle wasting  or atrophy  Neurologic: A&O X 3;  No focal weakness or paresthesias are detected Psychiatric:  The pt has Normal affect.   Non-Invasive Vascular Imaging:  05/17/20  +-------+-----------+-----------+------------+------------+  ABI/TBIToday's ABIToday's TBIPrevious ABIPrevious TBI  +-------+-----------+-----------+------------+------------+  Right 1.21    0.70    1.1     1.01      +-------+-----------+-----------+------------+------------+  Left  1.21    0.67    1.14    0.88      +-------+-----------+-----------+------------+------------+    Left Graft #1: +--------------------+--------+--------+---------+--------+            PSV cm/sStenosisWaveform Comments  +--------------------+--------+--------+---------+--------+  Inflow       81       triphasic      +--------------------+--------+--------+---------+--------+  Proximal Anastomosis107       biphasic       +--------------------+--------+--------+---------+--------+  Proximal Graft   97       biphasic       +--------------------+--------+--------+---------+--------+  Mid Graft      51       biphasic       +--------------------+--------+--------+---------+--------+  Distal Graft    51       biphasic       +--------------------+--------+--------+---------+--------+  Distal Anastomosis 61       biphasic       +--------------------+--------+--------+---------+--------+  Outflow       75       triphasic      +--------------------+--------+--------+---------+--------+   Summary:  Left: Patent left femoral to above knee popliteal bypass graft with no evidence of restenosis.    ASSESSMENT/PLAN:: 74 y.o. male here for follow up for follow up of peripheral vascular disease. He is not having any claudication symptoms, rest pain or non  healing wounds. Non invasive studies show patent left lower extremity bypass. ABI/TBI's are essentially unchanged. He has palpable DP/PT pulse bilaterally.  - he has been having recent pains from distal incision at knee radiating into left knee. I discussed that this may be nerve related but that is does not appear to vascular related. I have recommended he have this looked at by PCP if he continues to have increased disabling knee pain -Discussed with patient and his daughter that due to having a vein bypass using his GSV that swelling is often common following a lower extremity bypass - encourage continued lower extremity elevation, exercise therapy, and compression stockings - Recommend 15-20 mmHg of knee high compression stockings  -will obtain a venous reflux study of patients LLE over next 1-3 weeks. Concerns about left lower extremity swelling and large varicose veins. Can call patient with these results - Patient will otherwise follow up in 3 months with ABI's and Left lower extremity bypass duplex  Karoline Caldwell, PA-C [facility] [phone]  On call MD:  Carlis Abbott

## 2020-05-20 NOTE — Progress Notes (Signed)
Follow

## 2020-05-22 ENCOUNTER — Encounter: Payer: Self-pay | Admitting: Internal Medicine

## 2020-05-22 ENCOUNTER — Ambulatory Visit (INDEPENDENT_AMBULATORY_CARE_PROVIDER_SITE_OTHER): Payer: HMO | Admitting: Internal Medicine

## 2020-05-22 ENCOUNTER — Other Ambulatory Visit: Payer: Self-pay

## 2020-05-22 ENCOUNTER — Other Ambulatory Visit: Payer: Self-pay | Admitting: *Deleted

## 2020-05-22 DIAGNOSIS — I25119 Atherosclerotic heart disease of native coronary artery with unspecified angina pectoris: Secondary | ICD-10-CM

## 2020-05-22 DIAGNOSIS — M25561 Pain in right knee: Secondary | ICD-10-CM

## 2020-05-22 DIAGNOSIS — E785 Hyperlipidemia, unspecified: Secondary | ICD-10-CM

## 2020-05-22 DIAGNOSIS — I739 Peripheral vascular disease, unspecified: Secondary | ICD-10-CM

## 2020-05-22 DIAGNOSIS — R413 Other amnesia: Secondary | ICD-10-CM

## 2020-05-22 DIAGNOSIS — M25511 Pain in right shoulder: Secondary | ICD-10-CM | POA: Diagnosis not present

## 2020-05-22 DIAGNOSIS — M25562 Pain in left knee: Secondary | ICD-10-CM

## 2020-05-22 DIAGNOSIS — I70213 Atherosclerosis of native arteries of extremities with intermittent claudication, bilateral legs: Secondary | ICD-10-CM

## 2020-05-22 HISTORY — DX: Pain in right knee: M25.561

## 2020-05-22 MED ORDER — METHYLPREDNISOLONE 4 MG PO TBPK: ORAL_TABLET | ORAL | 0 refills | Status: DC

## 2020-05-22 NOTE — Progress Notes (Signed)
Subjective:  Patient ID: Harold Waters, male    DOB: 01/29/1946  Age: 74 y.o. MRN: [id number]  CC: No chief complaint on file.   HPI [place] presents for severe B knee pain x months - worse w/walking L>R  F/u CAD, HTN  C/o memory loss - Aricept caused n/v  Outpatient Medications Prior to Visit  Medication Sig Dispense Refill  . aspirin EC 81 MG tablet Take 81 mg by mouth daily.    . Blood Glucose Monitoring Suppl (ONETOUCH VERIO) w/Device KIT 1 Units by Does not apply route daily as needed. 1 kit 1  . Cholecalciferol (VITAMIN D3) 50 MCG (2000 UT) capsule Take 1 capsule (2,000 Units total) by mouth daily. 100 capsule 3  . clopidogrel (PLAVIX) 75 MG tablet Take 75 mg by mouth daily.    [name] glucose blood (ONETOUCH VERIO) test strip Use to check blood sugar daily 50 each 11  . isosorbide mononitrate (IMDUR) 120 MG 24 hr tablet TAKE 1 TABLET BY MOUTH EVERY DAY 90 tablet 1  . Multiple Vitamins-Minerals (MENS MULTIVITAMIN PLUS) TABS Take 1 tablet by mouth at bedtime.     . nitroGLYCERIN (NITROSTAT) 0.4 MG SL tablet Place 1 tablet under tongue every 5 mins. DO NOT USE MORE THAN 3 TABS 25 tablet 1  . rosuvastatin (CRESTOR) 40 MG tablet Take 1 tablet (40 mg total) by mouth daily. 30 tablet 3  . donepezil (ARICEPT) 10 MG tablet Take 1 tablet daily (Patient not taking: Reported on 05/22/2020) 90 tablet 3   No facility-administered medications prior to visit.    ROS: Review of Systems  Constitutional: Negative for appetite change, fatigue and unexpected weight change.  HENT: Negative for congestion, nosebleeds, sneezing, sore throat and trouble swallowing.   Eyes: Negative for itching and visual disturbance.  Respiratory: Negative for cough.   Cardiovascular: Negative for chest pain, palpitations and leg swelling.  Gastrointestinal: Negative for abdominal distention, blood in stool, diarrhea and nausea.  Genitourinary: Negative for frequency and hematuria.  Musculoskeletal:  Positive for arthralgias and gait problem. Negative for back pain, joint swelling and neck pain.  Skin: Negative for rash.  Neurological: Negative for dizziness, tremors, speech difficulty and weakness.  Psychiatric/Behavioral: Negative for agitation, dysphoric mood and sleep disturbance. The patient is not nervous/anxious.     Objective:  BP (!) 152/84 (BP Location: Left Arm, Patient Position: Sitting, Cuff Size: Normal)   Pulse 67   Temp 98 F (36.7 C) (Oral)   Ht _0  (1.651 m)   Wt 167 lb (75.8 kg)   SpO2 96%   BMI 27.79 kg/m   BP Readings from Last 3 Encounters:  05/22/20 (!) 152/84  05/20/20 (!) 162/82  03/29/20 (!) 154/79    Wt Readings from Last 3 Encounters:  05/22/20 167 lb (75.8 kg)  05/20/20 168 lb 9.6 oz (76.5 kg)  03/29/20 169 lb 12.8 oz (77 kg)    Physical Exam Constitutional:      General: He is not in acute distress.    Appearance: He is well-developed.     Comments: NAD  Eyes:     Conjunctiva/sclera: Conjunctivae normal.     Pupils: Pupils are equal, round, and reactive to light.  Neck:     Thyroid: No thyromegaly.     Vascular: No JVD.  Cardiovascular:     Rate and Rhythm: Normal rate and regular rhythm.     Heart sounds: Normal heart sounds. No murmur heard.  No friction rub. No gallop.  Pulmonary:     Effort: Pulmonary effort is normal. No respiratory distress.     Breath sounds: Normal breath sounds. No wheezing or rales.  Chest:     Chest wall: No tenderness.  Abdominal:     General: Bowel sounds are normal. There is no distension.     Palpations: Abdomen is soft. There is no mass.     Tenderness: There is no abdominal tenderness. There is no guarding or rebound.  Musculoskeletal:        General: Tenderness present. Normal range of motion.     Cervical back: Normal range of motion.  Lymphadenopathy:     Cervical: No cervical adenopathy.  Skin:    General: Skin is warm and dry.     Findings: No rash.  Neurological:     Mental  Status: He is alert and oriented to person, place, and time.     Cranial Nerves: No cranial nerve deficit.     Motor: No abnormal muscle tone.     Coordination: Coordination normal.     Gait: Gait normal.     Deep Tendon Reflexes: Reflexes are normal and symmetric.  Psychiatric:        Behavior: Behavior normal.        Thought Content: Thought content normal.        Judgment: Judgment normal.   B bursa anserina painful R>L L shoulder - pain on ROM  Lab Results  Component Value Date   WBC 6.2 07/03/2019   HGB 13.8 07/03/2019   HCT 41.8 07/03/2019   PLT 239.0 07/03/2019   GLUCOSE 95 10/25/2019   CHOL 156 05/03/2017   TRIG 150 (H) 05/03/2017   HDL 46 05/03/2017   LDLDIRECT 167 (H) 10/25/2019   LDLCALC 80 05/03/2017   ALT 33 11/28/2018   AST 20 11/28/2018   NA 142 10/25/2019   K 4.6 10/25/2019   CL 105 10/25/2019   CREATININE 1.09 10/25/2019   BUN 18 10/25/2019   CO2 25 10/25/2019   TSH 4.91 (H) 07/03/2019   PSA 3.69 12/24/2014   INR 0.99 11/28/2018   HGBA1C 8.2 (H) 12/29/2018    ABI  w/wo TBI   [id number]  Result Date: 05/17/2020 LOWER EXTREMITY DOPPLER STUDY Indications: Peripheral artery disease.  Vascular               Left CFA to AK popliteal bypass graft with vein on Interventions:         12/06/18. Comparison Study: No significant change since prior exam of 11/10/2019 Performing Technologist: Alvia Grove RVT  Examination Guidelines: A complete evaluation includes at minimum, Doppler waveform signals and systolic blood pressure reading at the level of bilateral brachial, anterior tibial, and posterior tibial arteries, when vessel segments are accessible. Bilateral testing is considered an integral part of a complete examination. Photoelectric Plethysmograph (PPG) waveforms and toe systolic pressure readings are included as required and additional duplex testing as needed. Limited examinations for reoccurring indications may be performed as noted.  ABI Findings:  +---------+------------------+-----+---------+--------+ Right    Rt Pressure (mmHg)IndexWaveform Comment  +---------+------------------+-----+---------+--------+ Brachial 141                                      +---------+------------------+-----+---------+--------+ PTA      171               1.21 triphasic         +---------+------------------+-----+---------+--------+ DP  163               1.16 triphasic         +---------+------------------+-----+---------+--------+ Great Toe98                0.70 Normal            +---------+------------------+-----+---------+--------+ +---------+------------------+-----+---------+-------+ Left     Lt Pressure (mmHg)IndexWaveform Comment +---------+------------------+-----+---------+-------+ Brachial 140                                     +---------+------------------+-----+---------+-------+ PTA      170               1.21 triphasic        +---------+------------------+-----+---------+-------+ DP       164               1.16 triphasic        +---------+------------------+-----+---------+-------+ Great Toe95                0.67 Abnormal         +---------+------------------+-----+---------+-------+ +-------+-----------+-----------+------------+------------+ ABI/TBIToday's ABIToday's TBIPrevious ABIPrevious TBI +-------+-----------+-----------+------------+------------+ Right  1.21       0.70       1.1         1.01         +-------+-----------+-----------+------------+------------+ Left   1.21       0.67       1.14        0.88         +-------+-----------+-----------+------------+------------+  Summary: Right: Resting right ankle-brachial index is within normal range. No evidence of significant right lower extremity arterial disease. The right toe-brachial index is normal. Left: Resting left ankle-brachial index is within normal range. No evidence of significant left lower extremity arterial  disease. The left toe-brachial index is abnormal.  *See table(s) above for measurements and observations.  Electronically signed by Servando Snare MD on 05/17/2020 at 2:36:08 PM.    Final    LE BYPASS GRAFT    [id number]  Result Date: 05/17/2020 LOWER EXTREMITY ARTERIAL DUPLEX STUDY Indications: Peripheral artery disease.  Vascular               Left CFA to AK popliteal bypass graft with vein on Interventions:         12/06/18. Current ABI:           Right ABI 1.21 Left 1.21 Performing Technologist: Alvia Grove RVT  Examination Guidelines: A complete evaluation includes B-mode imaging, spectral Doppler, color Doppler, and power Doppler as needed of all accessible portions of each vessel. Bilateral testing is considered an integral part of a complete examination. Limited examinations for reoccurring indications may be performed as noted.  Left Graft #1: +--------------------+--------+--------+---------+--------+                     PSV cm/sStenosisWaveform Comments +--------------------+--------+--------+---------+--------+ Inflow              81              triphasic         +--------------------+--------+--------+---------+--------+ Proximal Anastomosis107             biphasic          +--------------------+--------+--------+---------+--------+ Proximal Graft      97              biphasic          +--------------------+--------+--------+---------+--------+  Mid Graft           51              biphasic          +--------------------+--------+--------+---------+--------+ Distal Graft        51              biphasic          +--------------------+--------+--------+---------+--------+ Distal Anastomosis  61              biphasic          +--------------------+--------+--------+---------+--------+ Outflow             75              triphasic         +--------------------+--------+--------+---------+--------+   Summary: Left: Patent left femoral to above knee popliteal  bypass graft with no evidence of restenosis.  See table(s) above for measurements and observations. Electronically signed by Servando Snare MD on 05/17/2020 at 2:36:41 PM.    Final     Assessment & Plan:   There are no diagnoses linked to this encounter.   No orders of the defined types were placed in this encounter.    Follow-up: No follow-ups on file.  Walker Kehr, MD

## 2020-05-22 NOTE — Patient Instructions (Signed)
   Bursitis -  B bursa anserina bursitis   Bursitis is when the fluid-filled sac (bursa) that covers and protects a joint is swollen (inflamed). Bursitis is most common near joints such as the knees, elbows, hips, and shoulders. It can cause pain and stiffness. Follow these instructions at home: Medicines  Take over-the-counter and prescription medicines only as told by your doctor.  If you were prescribed an antibiotic medicine, take it as told by your doctor. Do not stop taking it even if you start to feel better. General instructions   Rest the affected area as told by your doctor. ? If you can, raise (elevate) the affected area above the level of your heart while you are sitting or lying down. ? Avoid doing things that make the pain worse.  Use a splint, brace, pad, or walking aid as told by your doctor.  If directed, put ice on the affected area: ? If you have a removable splint or brace, take it off as told by your doctor. ? Put ice in a plastic bag. ? Place a towel between your skin and the bag, or between the splint or brace and the bag. ? Leave the ice on for 20 minutes, 2-3 times a day.  Keep all follow-up visits as told by your doctor. This is important. Preventing symptoms Do these things to help you not have symptoms again:  Wear knee pads if you kneel often.  Wear sturdy running or walking shoes that fit you well.  Take a lot of breaks during activities that involve doing the same movements again and again.  Before you do any activity that takes a lot of effort, get your body ready by stretching.  Stay at a healthy weight or lose weight if your doctor says you should. If you need help doing this, ask your doctor.  Exercise often. If you start any new physical activity, do it slowly. Contact a doctor if you:  Have a fever.  Have chills.  Have symptoms that do not get better with treatment or home care. Summary  Bursitis is when the fluid-filled sac  (bursa) that covers and protects a joint is swollen.  Rest the affected area as told by your doctor.  Avoid doing things that make the pain worse.  Put ice on the affected area as told by your doctor. This information is not intended to replace advice given to you by your health care provider. Make sure you discuss any questions you have with your health care provider. Document Revised: 10/01/2017 Document Reviewed: 09/03/2017 Elsevier Patient Education  2020 [employer].

## 2020-05-22 NOTE — Assessment & Plan Note (Signed)
L>R  B bursa anserina bursitis and OA

## 2020-05-22 NOTE — Assessment & Plan Note (Signed)
Better  

## 2020-05-22 NOTE — Assessment & Plan Note (Signed)
-   Crestor 

## 2020-05-22 NOTE — Assessment & Plan Note (Signed)
Aricept caused n/v - d/c Try [place] Mushroom

## 2020-05-22 NOTE — Assessment & Plan Note (Addendum)
No angina Plavix, Isosorbide, Crestor

## 2020-05-27 ENCOUNTER — Ambulatory Visit (HOSPITAL_COMMUNITY)
Admission: RE | Admit: 2020-05-27 | Discharge: 2020-05-27 | Disposition: A | Discharge status: Home / Self Care | Payer: HMO | Source: Ambulatory Visit | Attending: Surgery | Admitting: Surgery

## 2020-05-27 ENCOUNTER — Other Ambulatory Visit: Payer: Self-pay

## 2020-05-27 DIAGNOSIS — I70213 Atherosclerosis of native arteries of extremities with intermittent claudication, bilateral legs: Secondary | ICD-10-CM | POA: Diagnosis not present

## 2020-05-27 DIAGNOSIS — I739 Peripheral vascular disease, unspecified: Secondary | ICD-10-CM | POA: Diagnosis not present

## 2020-05-31 ENCOUNTER — Other Ambulatory Visit: Payer: Self-pay | Admitting: *Deleted

## 2020-05-31 NOTE — Patient Outreach (Signed)
  [place] [facility]) Care Management Chronic Special Needs Program    05/31/2020  Name: Harold Waters, [place]: Sep 11, 1946  MRN: [id number]   Mr. [name] is enrolled in a chronic special needs plan for Diabetes.  RN care manager received notification "CSNP member call to [facility] inbound referral- visited 05/28/20, client non-adherent with at least one medication"  RN care manager called client and daughter to address and no answer to telephone.  PLAN Outreach client within 1-2 weeks  Jacqlyn Larsen [facility], BSN [place], [place]

## 2020-06-03 ENCOUNTER — Other Ambulatory Visit: Payer: Self-pay | Admitting: *Deleted

## 2020-06-03 NOTE — Patient Outreach (Signed)
  [place] [facility]) Care Management Chronic Special Needs Program    06/03/2020  Name: Harold Waters, [place]: 06-12-1946  MRN: [id number]   Mr. Harold Waters is enrolled in a chronic special needs plan for Diabetes.  RN care manager outreached client's daughter Harold Waters to inquire about client being non adherent with a medication,  Per Harold Waters, client has not been taking donepezil 10mg  daily due to side effects of nausea, vomiting and diarrhea.  Harold Waters states she is going to make a follow up appointment with neurologist Dr. Ellouise Newer to discuss and see if client may benefit from another medication.  RN care manager will report to neurologist.  RN care manager ask Harold Waters to contact RN care manager if any further issues with medications, side effects or obtaining medications. RN care manager sent in basket message to Dr. Ellouise Newer and reported side effects client is having and that client is unable to take donepezil, also informed client's daughter is going to make follow up appointment for client.  Harold Waters [facility], BSN [place], [place]

## 2020-06-04 ENCOUNTER — Other Ambulatory Visit: Payer: Self-pay | Admitting: *Deleted

## 2020-06-04 NOTE — Patient Outreach (Signed)
  [place] [facility]) Care Management Chronic Special Needs Program    06/04/2020  Name: Harold Waters, [place]: 04-29-1946  MRN: [id number]   Mr. Harold Waters is enrolled in a chronic special needs plan for Diabetes.  Dr. Delice Lesch (neurology) sent message to RN care manager stating client does not need to make an appointment to be seen for a medication change and this can be done over the phone, message states Dr. Amparo [place] office will contact client/ daughter to make them aware of this.  RN care manager called daughter [name] to inform her of the above, no answer to telephone and left voicemail explaining the above information.  Jacqlyn Larsen [facility], BSN [place], [place]

## 2020-06-06 ENCOUNTER — Other Ambulatory Visit: Payer: Self-pay

## 2020-06-06 ENCOUNTER — Telehealth: Payer: Self-pay

## 2020-06-06 MED ORDER — RIVASTIGMINE TARTRATE 1.5 MG PO CAPS: 1.5000 mg | ORAL_CAPSULE | Freq: Two times a day (BID) | ORAL | 1 refills | Status: DC

## 2020-06-06 NOTE — Telephone Encounter (Signed)
Spoke to [place] pt daughter the nausea/vomiting/diarrhea has stopped,  we can try a different medication to help slow down worsening memory, Rivastigmine. Side effects can be similar, but people who cannot tolerate Donepezil sometimes tolerate this better. If they would like to proceed, pls send in Rx for Rivastigmine 1.5mg  BID. Script sent [facility] to [facility] in [place] for [place]

## 2020-06-06 NOTE — Telephone Encounter (Signed)
-----   Message from Cameron Sprang, MD sent at 06/04/2020  9:05 AM EDT ----- Regarding: FW: Update Can you pls call his daughter and see how he is doing? If the nausea/vomiting/diarrhea have stopped, pls let her know we can try a different medication to help slow down worsening memory, Rivastigmine. Side effects can be similar, but people who cannot tolerate Donepezil sometimes tolerate this better. If they would like to proceed, pls send in Rx for Rivastigmine 1.5mg  BID. Thanks ----- Message ----- From: Kassie Mends, RN Sent: 06/03/2020  12:15 PM EDT To: Cameron Sprang, MD Subject: Update                                         I spoke with client's daughter [name] today and she reported Mr. Pauwels is not taking donepezil 10mg  daily due to side effects of nausea, vomiting, diarrhea.  She states she is going to make another appointment with you to see if there is anything else he can try.  I wanted you to be aware.  thanks

## 2020-06-17 ENCOUNTER — Ambulatory Visit (INDEPENDENT_AMBULATORY_CARE_PROVIDER_SITE_OTHER): Payer: HMO | Admitting: Internal Medicine

## 2020-06-17 ENCOUNTER — Encounter: Payer: Self-pay | Admitting: Internal Medicine

## 2020-06-17 ENCOUNTER — Other Ambulatory Visit: Payer: Self-pay

## 2020-06-17 VITALS — BP 118/62 | HR 57 | Temp 98.0°F | Ht 65.0 in | Wt 170.0 lb

## 2020-06-17 DIAGNOSIS — E118 Type 2 diabetes mellitus with unspecified complications: Secondary | ICD-10-CM | POA: Diagnosis not present

## 2020-06-17 DIAGNOSIS — R413 Other amnesia: Secondary | ICD-10-CM | POA: Diagnosis not present

## 2020-06-17 DIAGNOSIS — N320 Bladder-neck obstruction: Secondary | ICD-10-CM

## 2020-06-17 DIAGNOSIS — M25561 Pain in right knee: Secondary | ICD-10-CM | POA: Diagnosis not present

## 2020-06-17 DIAGNOSIS — E785 Hyperlipidemia, unspecified: Secondary | ICD-10-CM | POA: Diagnosis not present

## 2020-06-17 DIAGNOSIS — M25562 Pain in left knee: Secondary | ICD-10-CM

## 2020-06-17 DIAGNOSIS — I25119 Atherosclerotic heart disease of native coronary artery with unspecified angina pectoris: Secondary | ICD-10-CM

## 2020-06-17 DIAGNOSIS — R7309 Other abnormal glucose: Secondary | ICD-10-CM

## 2020-06-17 DIAGNOSIS — Z0000 Encounter for general adult medical examination without abnormal findings: Secondary | ICD-10-CM

## 2020-06-17 MED ORDER — MEMANTINE HCL 5 MG PO TABS
5.0000 mg | ORAL_TABLET | Freq: Two times a day (BID) | ORAL | 5 refills | Status: DC
Start: 2020-06-17 — End: 2020-07-12

## 2020-06-17 MED ORDER — NITROGLYCERIN 0.4 MG SL SUBL: SUBLINGUAL_TABLET | SUBLINGUAL | 5 refills | Status: DC

## 2020-06-17 NOTE — Assessment & Plan Note (Signed)
Plavix, Isosorbide, Crestor NTG prn

## 2020-06-17 NOTE — Progress Notes (Signed)
Subjective:  Patient ID: Harold Waters, male    DOB: 1946/08/20  Age: 74 y.o. MRN: [id number]  CC: No chief complaint on file.   HPI [place] presents for knee OA - much better, memory loss, CAD f/u  Outpatient Medications Prior to Visit  Medication Sig Dispense Refill  . aspirin EC 81 MG tablet Take 81 mg by mouth daily.    . Blood Glucose Monitoring Suppl (ONETOUCH VERIO) w/Device KIT 1 Units by Does not apply route daily as needed. 1 kit 1  . Cholecalciferol (VITAMIN D3) 50 MCG (2000 UT) capsule Take 1 capsule (2,000 Units total) by mouth daily. 100 capsule 3  . clopidogrel (PLAVIX) 75 MG tablet Take 75 mg by mouth daily.    [name] glucose blood (ONETOUCH VERIO) test strip Use to check blood sugar daily 50 each 11  . isosorbide mononitrate (IMDUR) 120 MG 24 hr tablet TAKE 1 TABLET BY MOUTH EVERY DAY 90 tablet 1  . methylPREDNISolone (MEDROL DOSEPAK) 4 MG TBPK tablet As directed 21 tablet 0  . Multiple Vitamins-Minerals (MENS MULTIVITAMIN PLUS) TABS Take 1 tablet by mouth at bedtime.     . nitroGLYCERIN (NITROSTAT) 0.4 MG SL tablet Place 1 tablet under tongue every 5 mins. DO NOT USE MORE THAN 3 TABS 25 tablet 1  . rivastigmine (EXELON) 1.5 MG capsule Take 1 capsule (1.5 mg total) by mouth 2 (two) times daily. 60 capsule 1  . rosuvastatin (CRESTOR) 40 MG tablet Take 1 tablet (40 mg total) by mouth daily. 30 tablet 3   No facility-administered medications prior to visit.    ROS: Review of Systems  Constitutional: Positive for unexpected weight change. Negative for appetite change and fatigue.  HENT: Negative for congestion, nosebleeds, sneezing, sore throat and trouble swallowing.   Eyes: Negative for itching and visual disturbance.  Respiratory: Negative for cough.   Cardiovascular: Negative for chest pain, palpitations and leg swelling.  Gastrointestinal: Negative for abdominal distention, blood in stool, diarrhea and nausea.  Genitourinary: Negative for frequency  and hematuria.  Musculoskeletal: Positive for arthralgias. Negative for back pain, gait problem, joint swelling and neck pain.  Skin: Negative for rash.  Neurological: Negative for dizziness, tremors, speech difficulty and weakness.  Psychiatric/Behavioral: Positive for decreased concentration. Negative for agitation, dysphoric mood and sleep disturbance. The patient is not nervous/anxious.     Objective:  BP 118/62 (BP Location: Right Arm, Patient Position: Sitting, Cuff Size: Large)   Pulse (!) 57   Temp 98 F (36.7 C) (Oral)   Ht _0  (1.651 m)   Wt 170 lb (77.1 kg)   SpO2 96%   BMI 28.29 kg/m   BP Readings from Last 3 Encounters:  06/17/20 118/62  05/22/20 (!) 152/84  05/20/20 (!) 162/82    Wt Readings from Last 3 Encounters:  06/17/20 170 lb (77.1 kg)  05/22/20 167 lb (75.8 kg)  05/20/20 168 lb 9.6 oz (76.5 kg)    Physical Exam Constitutional:      General: He is not in acute distress.    Appearance: He is well-developed. He is obese.     Comments: NAD  Eyes:     Conjunctiva/sclera: Conjunctivae normal.     Pupils: Pupils are equal, round, and reactive to light.  Neck:     Thyroid: No thyromegaly.     Vascular: No JVD.  Cardiovascular:     Rate and Rhythm: Normal rate and regular rhythm.     Heart sounds: Normal heart sounds. No murmur  heard.  No friction rub. No gallop.   Pulmonary:     Effort: Pulmonary effort is normal. No respiratory distress.     Breath sounds: Normal breath sounds. No wheezing or rales.  Chest:     Chest wall: No tenderness.  Abdominal:     General: Bowel sounds are normal. There is no distension.     Palpations: Abdomen is soft. There is no mass.     Tenderness: There is no abdominal tenderness. There is no guarding or rebound.  Musculoskeletal:        General: No tenderness. Normal range of motion.     Cervical back: Normal range of motion.  Lymphadenopathy:     Cervical: No cervical adenopathy.  Skin:    General: Skin is warm  and dry.     Findings: No rash.  Neurological:     Mental Status: He is alert and oriented to person, place, and time.     Cranial Nerves: No cranial nerve deficit.     Motor: No abnormal muscle tone.     Coordination: Coordination normal.     Gait: Gait normal.     Deep Tendon Reflexes: Reflexes are normal and symmetric.  Psychiatric:        Behavior: Behavior normal.        Thought Content: Thought content normal.        Judgment: Judgment normal.     Lab Results  Component Value Date   WBC 6.2 07/03/2019   HGB 13.8 07/03/2019   HCT 41.8 07/03/2019   PLT 239.0 07/03/2019   GLUCOSE 95 10/25/2019   CHOL 156 05/03/2017   TRIG 150 (H) 05/03/2017   HDL 46 05/03/2017   LDLDIRECT 167 (H) 10/25/2019   LDLCALC 80 05/03/2017   ALT 33 11/28/2018   AST 20 11/28/2018   NA 142 10/25/2019   K 4.6 10/25/2019   CL 105 10/25/2019   CREATININE 1.09 10/25/2019   BUN 18 10/25/2019   CO2 25 10/25/2019   TSH 4.91 (H) 07/03/2019   PSA 3.69 12/24/2014   INR 0.99 11/28/2018   HGBA1C 8.2 (H) 12/29/2018    VAS [place] LOWER EXTREMITY VENOUS REFLUX  Result Date: 05/28/2020  Lower Venous Reflux Study Indications: Swelling.  Risk Factors: Surgery Left fem-pop BPB with ipsilateral vein on 12/07/19. Performing Technologist: Ralene Cork RVT  Examination Guidelines: A complete evaluation includes B-mode imaging, spectral Doppler, color Doppler, and power Doppler as needed of all accessible portions of each vessel. Bilateral testing is considered an integral part of a complete examination. Limited examinations for reoccurring indications may be performed as noted. The reflux portion of the exam is performed with the patient in reverse Trendelenburg. Significant venous reflux is defined as >500 ms in the superficial venous system, and >1 second in the deep venous system.  +-------------------+---------+------+-----------+------------+------------+ LEFT               Reflux NoRefluxReflux TimeDiameter  cmsComments                                  Yes                                      +-------------------+---------+------+-----------+------------+------------+ CFV  yes   >1 second                          +-------------------+---------+------+-----------+------------+------------+ FV mid                       yes   >1 second                          +-------------------+---------+------+-----------+------------+------------+ Popliteal                    yes   >1 second                          +-------------------+---------+------+-----------+------------+------------+ GSV prox thigh                                           used for BPG +-------------------+---------+------+-----------+------------+------------+ SSV Pop Fossa                yes                0.499                 +-------------------+---------+------+-----------+------------+------------+ SSV prox calf      no                           0.462                 +-------------------+---------+------+-----------+------------+------------+ SSV mid calf                 yes    >500 ms     0.518                 +-------------------+---------+------+-----------+------------+------------+ SSV thigh extension          yes    >500 ms     0.377                 +-------------------+---------+------+-----------+------------+------------+   Summary: Left: - No evidence of deep vein thrombosis seen in the left lower extremity, from the common femoral through the popliteal veins. - No evidence of superficial venous thrombosis in the left lower extremity. - Deep vein reflux in the CFV, one of two FV, and popliteal vein. - Superficial vein reflux in the SSV and thigh extension. - Numerous varicosities in the calf. - GSV used for bypass graft.  *See table(s) above for measurements and observations. Electronically signed by Curt Jews MD on 05/28/2020 at 8:33:16 AM.    Final      Assessment & Plan:   There are no diagnoses linked to this encounter.   No orders of the defined types were placed in this encounter.    Follow-up: No follow-ups on file.  Walker Kehr, MD

## 2020-06-17 NOTE — Assessment & Plan Note (Signed)
Much better after Medrol pack

## 2020-06-17 NOTE — Assessment & Plan Note (Signed)
Metformin 

## 2020-06-17 NOTE — Assessment & Plan Note (Signed)
-   Crestor 

## 2020-06-17 NOTE — Assessment & Plan Note (Signed)
Namenda to try

## 2020-06-28 ENCOUNTER — Telehealth: Payer: Self-pay

## 2020-06-28 NOTE — Telephone Encounter (Signed)
10:03am:  Patient's daughter [name] LVM stating her dad's condition has gotten worst. She states her dad is having a hard time walking. He is having to hold on to a wall or objects to walk. She states he can barely put pressure on his foot and would like to know recommendations for what they should do prior to his appt in October.  10:19: I attempted to reach [facility], She was unavailable so I left a VM to call back.

## 2020-07-11 ENCOUNTER — Other Ambulatory Visit: Payer: Self-pay | Admitting: Neurology

## 2020-07-11 ENCOUNTER — Other Ambulatory Visit: Payer: Self-pay | Admitting: Internal Medicine

## 2020-07-25 ENCOUNTER — Other Ambulatory Visit: Payer: Self-pay | Admitting: Neurology

## 2020-08-01 ENCOUNTER — Other Ambulatory Visit: Payer: Self-pay | Admitting: General Practice

## 2020-08-23 ENCOUNTER — Ambulatory Visit (INDEPENDENT_AMBULATORY_CARE_PROVIDER_SITE_OTHER)
Admission: RE | Admit: 2020-08-23 | Discharge: 2020-08-23 | Disposition: A | Discharge status: Home / Self Care | Payer: HMO | Source: Ambulatory Visit | Attending: Vascular Surgery | Admitting: Vascular Surgery

## 2020-08-23 ENCOUNTER — Ambulatory Visit (INDEPENDENT_AMBULATORY_CARE_PROVIDER_SITE_OTHER): Payer: HMO | Admitting: Physician Assistant

## 2020-08-23 ENCOUNTER — Ambulatory Visit (INDEPENDENT_AMBULATORY_CARE_PROVIDER_SITE_OTHER): Payer: HMO | Admitting: Internal Medicine

## 2020-08-23 ENCOUNTER — Encounter: Payer: Self-pay | Admitting: Internal Medicine

## 2020-08-23 ENCOUNTER — Ambulatory Visit (HOSPITAL_COMMUNITY)
Admission: RE | Admit: 2020-08-23 | Discharge: 2020-08-23 | Disposition: A | Discharge status: Home / Self Care | Payer: HMO | Source: Ambulatory Visit | Attending: Vascular Surgery | Admitting: Vascular Surgery

## 2020-08-23 ENCOUNTER — Other Ambulatory Visit: Payer: Self-pay

## 2020-08-23 VITALS — BP 183/87 | HR 57 | Temp 98.3°F | Resp 20 | Ht 65.0 in | Wt 167.9 lb

## 2020-08-23 DIAGNOSIS — I70213 Atherosclerosis of native arteries of extremities with intermittent claudication, bilateral legs: Secondary | ICD-10-CM

## 2020-08-23 DIAGNOSIS — R350 Frequency of micturition: Secondary | ICD-10-CM

## 2020-08-23 DIAGNOSIS — I10 Essential (primary) hypertension: Secondary | ICD-10-CM | POA: Diagnosis not present

## 2020-08-23 DIAGNOSIS — H5319 Other subjective visual disturbances: Secondary | ICD-10-CM

## 2020-08-23 DIAGNOSIS — I739 Peripheral vascular disease, unspecified: Secondary | ICD-10-CM | POA: Insufficient documentation

## 2020-08-23 DIAGNOSIS — R600 Localized edema: Secondary | ICD-10-CM | POA: Diagnosis not present

## 2020-08-23 DIAGNOSIS — E118 Type 2 diabetes mellitus with unspecified complications: Secondary | ICD-10-CM | POA: Diagnosis not present

## 2020-08-23 DIAGNOSIS — M7989 Other specified soft tissue disorders: Secondary | ICD-10-CM

## 2020-08-23 HISTORY — DX: Frequency of micturition: R35.0

## 2020-08-23 HISTORY — DX: Other subjective visual disturbances: H53.19

## 2020-08-23 MED ORDER — CLONIDINE HCL 0.1 MG PO TABS: 0.1000 mg | ORAL_TABLET | Freq: Two times a day (BID) | ORAL | 5 refills | Status: DC

## 2020-08-23 NOTE — Progress Notes (Signed)
Subjective:  Patient ID: Harold Waters, male    DOB: 05-09-1946  Age: 74 y.o. MRN: [id number]  CC: Urinary Frequency and Blurred Vision   HPI [facility] presents for shape distorted vision and colors since 5 am; resolved at 3 pm. He had a HA. He has a HA now. C/o urinary frequency; no pain F/u HTN  Outpatient Medications Prior to Visit  Medication Sig Dispense Refill  . aspirin EC 81 MG tablet Take 81 mg by mouth daily.    . Blood Glucose Monitoring Suppl (ONETOUCH VERIO) w/Device KIT 1 Units by Does not apply route daily as needed. 1 kit 1  . Cholecalciferol (VITAMIN D3) 50 MCG (2000 UT) capsule Take 1 capsule (2,000 Units total) by mouth daily. 100 capsule 3  . clopidogrel (PLAVIX) 75 MG tablet Take 75 mg by mouth daily.    [name] glucose blood (ONETOUCH VERIO) test strip Use to check blood sugar daily 50 each 11  . isosorbide mononitrate (IMDUR) 120 MG 24 hr tablet TAKE 1 TABLET BY MOUTH EVERY DAY 90 tablet 1  . memantine (NAMENDA) 5 MG tablet TAKE 1 TABLET BY MOUTH TWICE A DAY 180 tablet 1  . methylPREDNISolone (MEDROL DOSEPAK) 4 MG TBPK tablet As directed 21 tablet 0  . Multiple Vitamins-Minerals (MENS MULTIVITAMIN PLUS) TABS Take 1 tablet by mouth at bedtime.     . nitroGLYCERIN (NITROSTAT) 0.4 MG SL tablet Place 1 tablet under tongue every 5 mins. DO NOT USE MORE THAN 3 TABS 25 tablet 5  . rivastigmine (EXELON) 1.5 MG capsule TAKE 1 CAPSULE BY MOUTH TWICE A DAY 180 capsule 0  . rosuvastatin (CRESTOR) 40 MG tablet TAKE 1 TABLET BY MOUTH EVERY DAY 90 tablet 1   No facility-administered medications prior to visit.    ROS: Review of Systems  Constitutional: Positive for fatigue. Negative for appetite change and unexpected weight change.  HENT: Negative for congestion, nosebleeds, sneezing, sore throat and trouble swallowing.   Eyes: Positive for visual disturbance. Negative for pain and itching.  Respiratory: Negative for cough.   Cardiovascular: Negative for chest  pain, palpitations and leg swelling.  Gastrointestinal: Negative for abdominal distention, blood in stool, diarrhea and nausea.  Genitourinary: Positive for frequency. Negative for hematuria.  Musculoskeletal: Negative for back pain, gait problem, joint swelling and neck pain.  Skin: Negative for rash.  Neurological: Positive for headaches. Negative for dizziness, tremors, speech difficulty and weakness.  Psychiatric/Behavioral: Negative for agitation, dysphoric mood and sleep disturbance. The patient is not nervous/anxious.     Objective:  BP (!) 190/86 (BP Location: Left Arm, Patient Position: Sitting, Cuff Size: Large)   Pulse 66   Temp 98.7 F (37.1 C) (Oral)   Ht 5' 5"  (1.651 m)   Wt 168 lb (76.2 kg)   SpO2 93%   BMI 27.96 kg/m   BP Readings from Last 3 Encounters:  08/23/20 (!) 190/86  08/23/20 (!) 183/87  06/17/20 118/62    Wt Readings from Last 3 Encounters:  08/23/20 168 lb (76.2 kg)  08/23/20 167 lb 14.4 oz (76.2 kg)  06/17/20 170 lb (77.1 kg)    Physical Exam Constitutional:      General: He is not in acute distress.    Appearance: He is well-developed.     Comments: NAD  Eyes:     Conjunctiva/sclera: Conjunctivae normal.     Pupils: Pupils are equal, round, and reactive to light.  Neck:     Thyroid: No thyromegaly.  Vascular: No JVD.  Cardiovascular:     Rate and Rhythm: Normal rate and regular rhythm.     Heart sounds: Normal heart sounds. No murmur heard.  No friction rub. No gallop.   Pulmonary:     Effort: Pulmonary effort is normal. No respiratory distress.     Breath sounds: Normal breath sounds. No wheezing or rales.  Chest:     Chest wall: No tenderness.  Abdominal:     General: Bowel sounds are normal. There is no distension.     Palpations: Abdomen is soft. There is no mass.     Tenderness: There is no abdominal tenderness. There is no guarding or rebound.  Musculoskeletal:        General: No tenderness. Normal range of motion.      Cervical back: Normal range of motion.  Lymphadenopathy:     Cervical: No cervical adenopathy.  Skin:    General: Skin is warm and dry.     Findings: No rash.  Neurological:     Mental Status: He is alert and oriented to person, place, and time.     Cranial Nerves: No cranial nerve deficit.     Motor: No abnormal muscle tone.     Coordination: Coordination normal.     Gait: Gait normal.     Deep Tendon Reflexes: Reflexes are normal and symmetric.  Psychiatric:        Behavior: Behavior normal.        Thought Content: Thought content normal.        Judgment: Judgment normal.   Pupils are narrow.  Ophthalmoscope exam is very limited As with full range of motion.  Vision is grossly intact Your exam is normal.  Nonfocal.  No pronator drift.    A total time of >45 minutes was spent preparing to see the patient, reviewing tests, x-rays, operative reports and outside records.  Also, obtaining history and performing comprehensive physical exam.  Additionally, counseling the patient regarding the above listed issues.   Finally, documenting clinical information in the health records, coordination of care, educating the patient in respect to ophthalmological emergencies.  He will go to ER if his eye symptoms recur. It is a complex case.   Lab Results  Component Value Date   WBC 6.2 07/03/2019   HGB 13.8 07/03/2019   HCT 41.8 07/03/2019   PLT 239.0 07/03/2019   GLUCOSE 95 10/25/2019   CHOL 156 05/03/2017   TRIG 150 (H) 05/03/2017   HDL 46 05/03/2017   LDLDIRECT 167 (H) 10/25/2019   LDLCALC 80 05/03/2017   ALT 33 11/28/2018   AST 20 11/28/2018   NA 142 10/25/2019   K 4.6 10/25/2019   CL 105 10/25/2019   CREATININE 1.09 10/25/2019   BUN 18 10/25/2019   CO2 25 10/25/2019   TSH 4.91 (H) 07/03/2019   PSA 3.69 12/24/2014   INR 0.99 11/28/2018   HGBA1C 8.2 (H) 12/29/2018    VAS [place] ABI WITH/WO TBI  Result Date: 08/23/2020 LOWER EXTREMITY DOPPLER STUDY Indications: Peripheral artery  disease.  Vascular               Left CFA to AK popliteal bypass graft with vein on Interventions:         12/06/18. Performing Technologist: Ronal Fear RVS, RCS  Examination Guidelines: A complete evaluation includes at minimum, Doppler waveform signals and systolic blood pressure reading at the level of bilateral brachial, anterior tibial, and posterior tibial arteries, when vessel segments are accessible. Bilateral  testing is considered an integral part of a complete examination. Photoelectric Plethysmograph (PPG) waveforms and toe systolic pressure readings are included as required and additional duplex testing as needed. Limited examinations for reoccurring indications may be performed as noted.  ABI Findings: +---------+------------------+-----+---------+--------+ Right    Rt Pressure (mmHg)IndexWaveform Comment  +---------+------------------+-----+---------+--------+ Brachial 190                                      +---------+------------------+-----+---------+--------+ PTA      211               1.08 triphasic         +---------+------------------+-----+---------+--------+ DP       196               1.00 biphasic          +---------+------------------+-----+---------+--------+ Great Toe147               0.75                   +---------+------------------+-----+---------+--------+ +---------+------------------+-----+---------+-------+ Left     Lt Pressure (mmHg)IndexWaveform Comment +---------+------------------+-----+---------+-------+ Brachial 196                                     +---------+------------------+-----+---------+-------+ PTA      190               0.97 triphasic        +---------+------------------+-----+---------+-------+ DP       170               0.87 triphasic        +---------+------------------+-----+---------+-------+ Adair Patter               0.68                  +---------+------------------+-----+---------+-------+  +-------+-----------+-----------+------------+------------+ ABI/TBIToday's ABIToday's TBIPrevious ABIPrevious TBI +-------+-----------+-----------+------------+------------+ Right  1.08       0.75       1.21        0.70         +-------+-----------+-----------+------------+------------+ Left   0.97       0.68       1.21        0.67         +-------+-----------+-----------+------------+------------+ Bilateral ABIs appear essentially unchanged compared to prior study on 05/17/2020.  Summary: Right: Resting right ankle-brachial index is within normal range. No evidence of significant right lower extremity arterial disease. The right toe-brachial index is normal. Left: Resting left ankle-brachial index is within normal range. No evidence of significant left lower extremity arterial disease. The left toe-brachial index is abnormal.  *See table(s) above for measurements and observations.  Electronically signed by Servando Snare MD on 08/23/2020 at 1:02:39 PM.   Final    VAS [place] LOWER EXTREMITY BYPASS GRAFT DUPLEX  Result Date: 08/23/2020 LOWER EXTREMITY ARTERIAL DUPLEX STUDY Indications: Peripheral artery disease. Patient complains of left hip              claudication when walking on an incline.  Vascular               Left CFA to AK popliteal bypass graft with vein on Interventions:         12/06/18. Current ABI:           R=1.08,  L=0.97 Performing Technologist: Ronal Fear RVS, RCS  Examination Guidelines: A complete evaluation includes B-mode imaging, spectral Doppler, color Doppler, and power Doppler as needed of all accessible portions of each vessel. Bilateral testing is considered an integral part of a complete examination. Limited examinations for reoccurring indications may be performed as noted.  Left Graft #1: common femoral to above knee popliteal +-------------------+--------+--------------+---------+------------------------+                    PSV cm/sStenosis      Waveform  Comments                 +-------------------+--------+--------------+---------+------------------------+ Inflow             113                   biphasic                          +-------------------+--------+--------------+---------+------------------------+ Proximal           105                   biphasic                          Anastomosis                                                                +-------------------+--------+--------------+---------+------------------------+ Proximal Graft     125     50-70%        triphasicvelocity increased                                  stenosis               125-188                  +-------------------+--------+--------------+---------+------------------------+ Mid Graft          47                    biphasic                          +-------------------+--------+--------------+---------+------------------------+ Distal Graft       40                    biphasic                          +-------------------+--------+--------------+---------+------------------------+ Distal Anastomosis 53                    biphasic                          +-------------------+--------+--------------+---------+------------------------+ Outflow            72                    triphasic                         +-------------------+--------+--------------+---------+------------------------+  Summary: Left: Patent bypass graft with an increase in velocity in the proximal graft segment suggestive of a 50-70% stenosis.  See table(s) above for measurements and observations. Electronically signed by Servando Snare MD on 08/23/2020 at 1:02:50 PM.    Final     Assessment & Plan:   There are no diagnoses linked to this encounter.   No orders of the defined types were placed in this encounter.    Follow-up: No follow-ups on file.  Walker Kehr, MD

## 2020-08-23 NOTE — Assessment & Plan Note (Signed)
Labs

## 2020-08-23 NOTE — Assessment & Plan Note (Signed)
Start Clonidine RTC 7 d

## 2020-08-23 NOTE — Assessment & Plan Note (Signed)
B ?retinal detachment vs other. Resolved Ophth ref next wk Rest

## 2020-08-23 NOTE — Assessment & Plan Note (Signed)
UA. Labs

## 2020-08-23 NOTE — Progress Notes (Signed)
Established Previous Bypass   History of Present Illness   Harold Waters is a 74 y.o. (03/07/1946) male who presents with chief complaint: edema of LLE.  He has had left common femoral to above-the-knee popliteal artery bypass with vein by Dr. Donzetta Matters on 12/06/2018.  He has had edema of left lower extremity that has not had any significant improvement since surgery per patient.  He had a venous reflux study performed in July of this year which demonstrated deep venous reflux with incompetent popliteal femoral and common femoral vein as well as incompetent small saphenous vein.  He also has varicose veins in his medial lower leg distal to his popliteal incision.  He states these have been been there for years however do not affect his day-to-day life.  He was measured and prescribed knee-high 15 to 20 mmHg compression at his last office visit which he states have offered relief and he is interested in purchasing a second pair.    He also describes burning in his left thigh that occurs without pattern.  It is not very bothersome to him however it does make him worry about his bypass graft.  He continues to take his aspirin and Plavix daily.  He denies tobacco use.    Current Outpatient Medications  Medication Sig Dispense Refill  . aspirin EC 81 MG tablet Take 81 mg by mouth daily.    . Blood Glucose Monitoring Suppl (ONETOUCH VERIO) w/Device KIT 1 Units by Does not apply route daily as needed. 1 kit 1  . Cholecalciferol (VITAMIN D3) 50 MCG (2000 UT) capsule Take 1 capsule (2,000 Units total) by mouth daily. 100 capsule 3  . clopidogrel (PLAVIX) 75 MG tablet Take 75 mg by mouth daily.    [name] glucose blood (ONETOUCH VERIO) test strip Use to check blood sugar daily 50 each 11  . isosorbide mononitrate (IMDUR) 120 MG 24 hr tablet TAKE 1 TABLET BY MOUTH EVERY DAY 90 tablet 1  . memantine (NAMENDA) 5 MG tablet TAKE 1 TABLET BY MOUTH TWICE A DAY 180 tablet 1  . methylPREDNISolone (MEDROL DOSEPAK) 4  MG TBPK tablet As directed 21 tablet 0  . Multiple Vitamins-Minerals (MENS MULTIVITAMIN PLUS) TABS Take 1 tablet by mouth at bedtime.     . nitroGLYCERIN (NITROSTAT) 0.4 MG SL tablet Place 1 tablet under tongue every 5 mins. DO NOT USE MORE THAN 3 TABS 25 tablet 5  . rivastigmine (EXELON) 1.5 MG capsule TAKE 1 CAPSULE BY MOUTH TWICE A DAY 180 capsule 0  . rosuvastatin (CRESTOR) 40 MG tablet TAKE 1 TABLET BY MOUTH EVERY DAY 90 tablet 1   No current facility-administered medications for this visit.    REVIEW OF SYSTEMS (negative unless checked):   Cardiac:  []  Chest pain or chest pressure? []  Shortness of breath upon activity? []  Shortness of breath when lying flat? []  Irregular heart rhythm?  Vascular:  []  Pain in calf, thigh, or hip brought on by walking? []  Pain in feet at night that wakes you up from your sleep? []  Blood clot in your veins? []  Leg swelling?  Pulmonary:  []  Oxygen at home? []  Productive cough? []  Wheezing?  Neurologic:  []  Sudden weakness in arms or legs? []  Sudden numbness in arms or legs? []  Sudden onset of difficult speaking or slurred speech? []  Temporary loss of vision in one eye? []  Problems with dizziness?  Gastrointestinal:  []  Blood in stool? []  Vomited blood?  Genitourinary:  []  Burning when urinating? []  Blood  in urine?  Psychiatric:  []  Major depression  Hematologic:  []  Bleeding problems? []  Problems with blood clotting?  Dermatologic:  []  Rashes or ulcers?  Constitutional:  []  Fever or chills?  Ear/Nose/Throat:  []  Change in hearing? []  Nose bleeds? []  Sore throat?  Musculoskeletal:  []  Back pain? []  Joint pain? []  Muscle pain?   Physical Examination   Vitals:   08/23/20 1026  BP: (!) 183/87  Pulse: (!) 57  Resp: 20  Temp: 98.3 F (36.8 C)  TempSrc: Temporal  SpO2: 97%  Weight: 167 lb 14.4 oz (76.2 kg)  Height: 5' 5"  (1.651 m)   Body mass index is 27.94 kg/m.  General:  WDWN in NAD; vital signs  documented above Gait: Not observed HENT: WNL, normocephalic Pulmonary: normal non-labored breathing , without Rales, rhonchi,  wheezing Cardiac: regular HR Abdomen: soft, NT, no masses Skin: without rashes Vascular Exam/Pulses: Palpable left DP and PT pulse;  Extremities: Edema of left leg compared to right extending to the level of the mid shin; nontender varicose veins medial lower leg Musculoskeletal: no muscle wasting or atrophy  Neurologic: A&O X 3;  No focal weakness or paresthesias are detected Psychiatric:  The pt has Normal affect.  Non-Invasive Vascular Imaging ABI  ABI/TBIToday's ABIToday's TBIPrevious ABIPrevious TBI  +-------+-----------+-----------+------------+------------+  Right 1.08    0.75    1.21    0.70      +-------+-----------+-----------+------------+------------+  Left  0.97    0.68    1.21    0.67   Bypass Duplex   Proximal graft with 125 cm/s; graft otherwise widely patent   Medical Decision Making   [facility] [place] is a 74 y.o. male who presents for surveillance of left lower extremity bypass as well as for evaluation of edema of left lower extremity  -Left lower extremity bypass is widely patent and left foot is well-perfused with palpable PT and DP pulses -Patient has known deep venous insufficiency as well as small saphenous vein insufficiency -Offered the patient follow-up with Dr. Oneida Alar or Dr. Scot Dock for evaluation and further work-up of venous insufficiency however patient is satisfied with continuing to wear knee-high compression 15 to 20 mmHg and elevating his leg during the day.  If he changes his mind he will notify our office. -We will recheck bypass duplex as well as ABIs in 1 year   Dagoberto Ligas PA-C [facility] Office: [facility] MD: Donzetta Matters

## 2020-08-24 LAB — URINALYSIS
Bilirubin Urine: NEGATIVE
Glucose, UA: NEGATIVE
Hgb urine dipstick: NEGATIVE
Ketones, ur: NEGATIVE
Leukocytes,Ua: NEGATIVE
Nitrite: NEGATIVE
Protein, ur: NEGATIVE
Specific Gravity, Urine: 1.017 (ref 1.001–1.03)
pH: 7 (ref 5.0–8.0)

## 2020-08-24 LAB — COMPREHENSIVE METABOLIC PANEL
AG Ratio: 1.9 (calc) (ref 1.0–2.5)
ALT: 19 U/L (ref 9–46)
AST: 17 U/L (ref 10–35)
Albumin: 4.4 g/dL (ref 3.6–5.1)
Alkaline phosphatase (APISO): 70 U/L (ref 35–144)
BUN/Creatinine Ratio: 16 (calc) (ref 6–22)
BUN: 25 mg/dL (ref 7–25)
CO2: 28 mmol/L (ref 20–32)
Calcium: 9 mg/dL (ref 8.6–10.3)
Chloride: 105 mmol/L (ref 98–110)
Creat: 1.52 mg/dL — ABNORMAL HIGH (ref 0.70–1.18)
Globulin: 2.3 g/dL (calc) (ref 1.9–3.7)
Glucose, Bld: 99 mg/dL (ref 65–99)
Potassium: 4.5 mmol/L (ref 3.5–5.3)
Sodium: 141 mmol/L (ref 135–146)
Total Bilirubin: 0.3 mg/dL (ref 0.2–1.2)
Total Protein: 6.7 g/dL (ref 6.1–8.1)

## 2020-08-24 LAB — HEMOGLOBIN A1C
Hgb A1c MFr Bld: 7.6 % of total Hgb — ABNORMAL HIGH (ref <5.7)
Mean Plasma Glucose: 171 (calc)
eAG (mmol/L): 9.5 (calc)

## 2020-08-26 ENCOUNTER — Encounter: Payer: Self-pay | Admitting: Internal Medicine

## 2020-08-27 DIAGNOSIS — H35033 Hypertensive retinopathy, bilateral: Secondary | ICD-10-CM | POA: Diagnosis not present

## 2020-08-27 DIAGNOSIS — H25813 Combined forms of age-related cataract, bilateral: Secondary | ICD-10-CM | POA: Diagnosis not present

## 2020-08-27 DIAGNOSIS — R7309 Other abnormal glucose: Secondary | ICD-10-CM | POA: Diagnosis not present

## 2020-08-29 IMAGING — CT CT HEAD W/O CM
3 series · 15 of 45 positions shown, 18 images · non-contrast
Comparison: Head CT, 01/31/2013

CLINICAL DATA: Increased forgetfulness and inability to sleep more
than 3 hours at night increasing over several years. Worse since
arterial bypass surgery in [REDACTED]. Eval for TIA.

EXAM:
CT HEAD WITHOUT CONTRAST
TECHNIQUE: Contiguous axial images were obtained from the base of the skull
through the vertex without intravenous contrast.

[Series 2: head 5.0 h37s · axial · 0.40mm/px · z∈[+165,+280]mm · 9 of 28 slices shown, 12 images]
[im 3/28  brain]
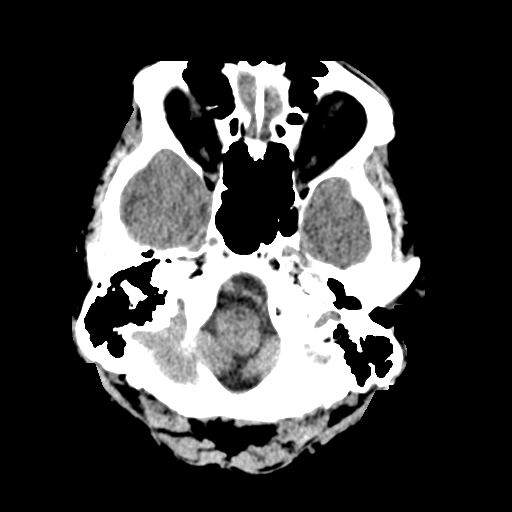
[im 3/28  bone]
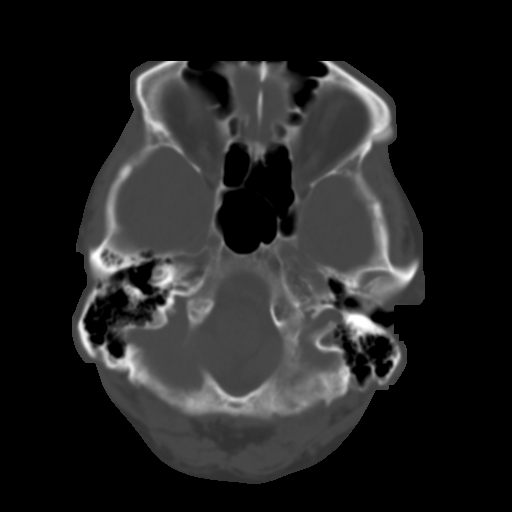
[im 6/28  brain]
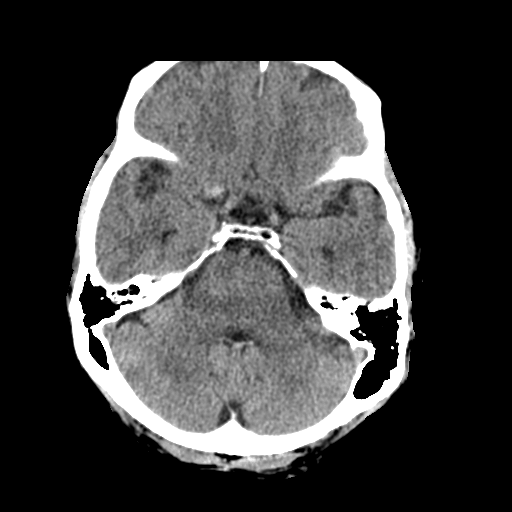
[im 9/28  brain]
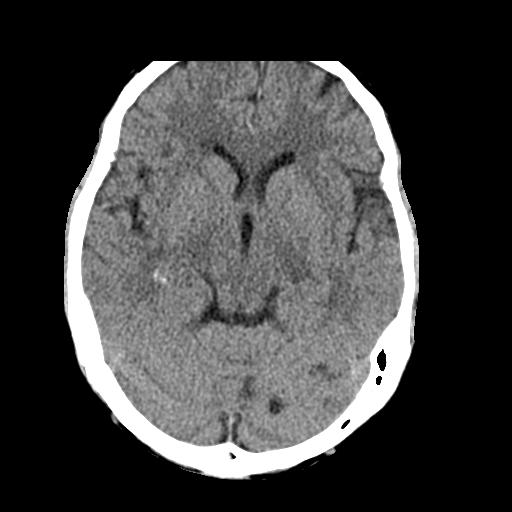
[im 12/28  brain]
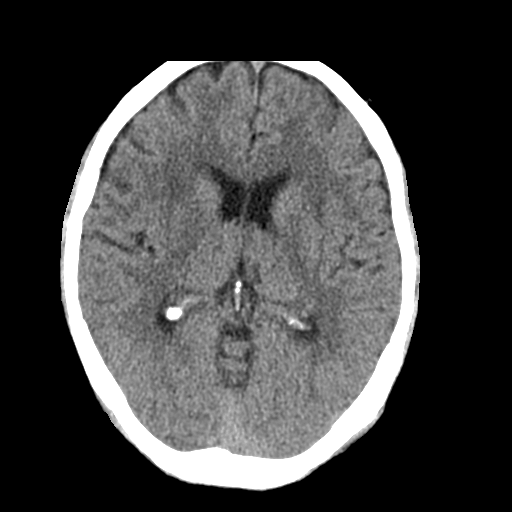
[im 15/28  brain]
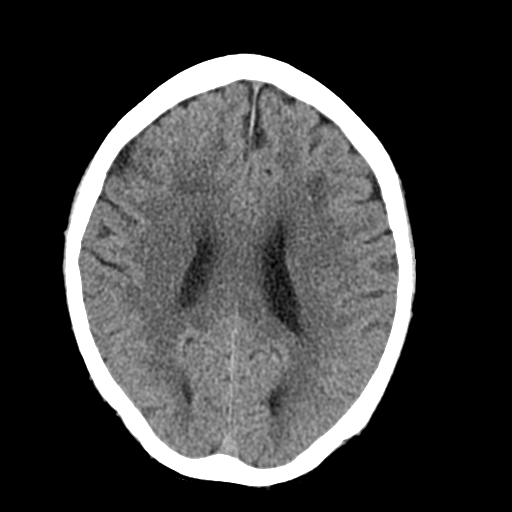
[im 15/28  bone]
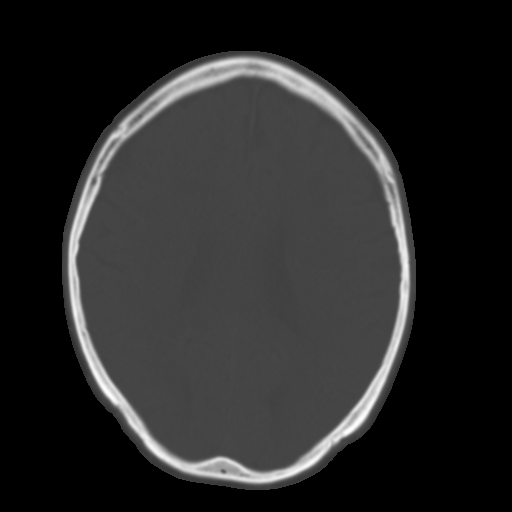
[im 17/28  brain]
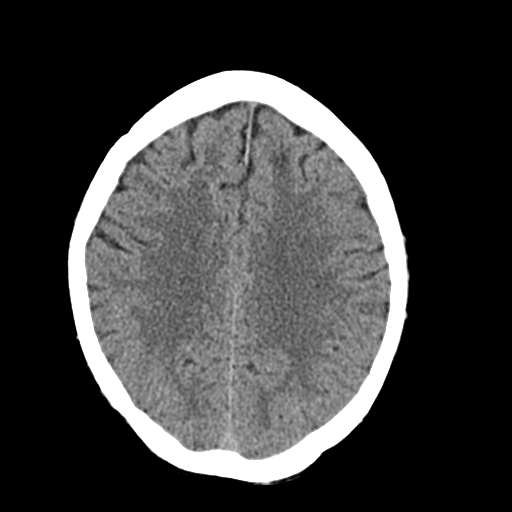
[im 20/28  brain]
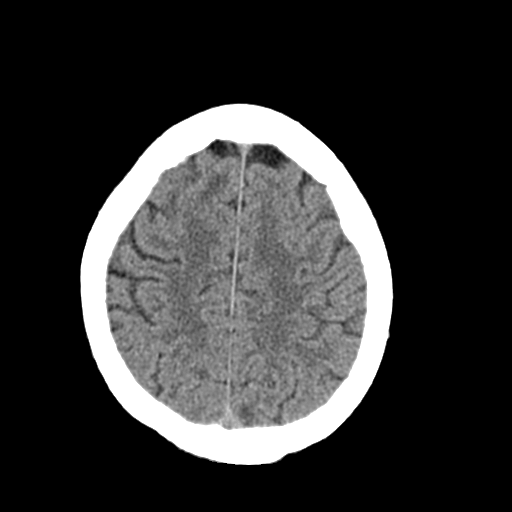
[im 23/28  brain]
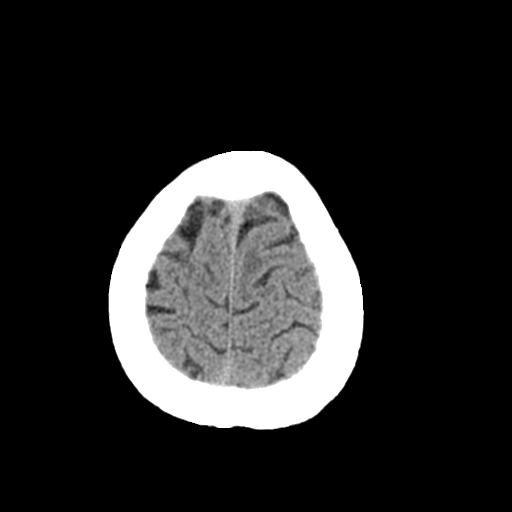
[im 26/28  brain]
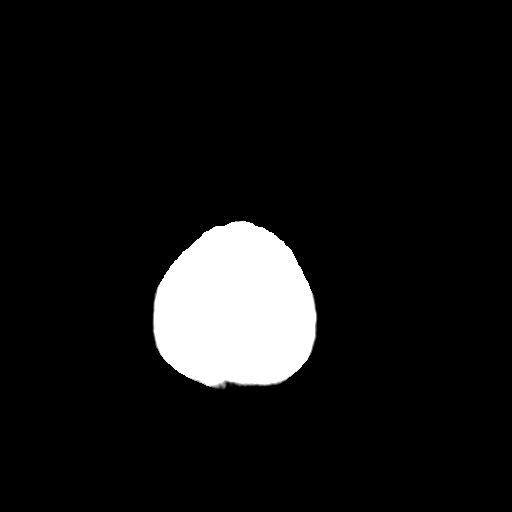
[im 26/28  bone]
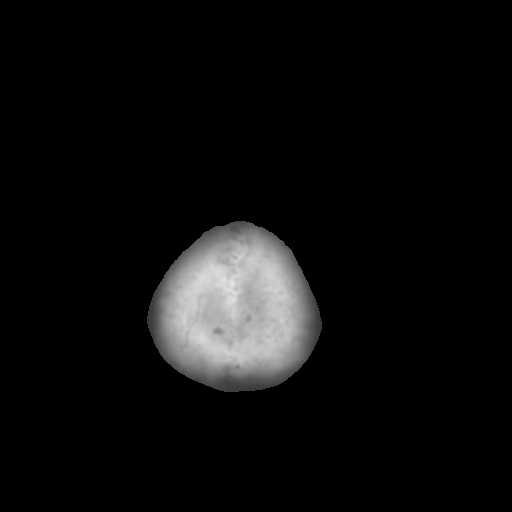

[Series 4: head 3.0 mpr cor · coronal · 0.29mm/px · 3 of 63 slices shown]
[im 21/63  brain]
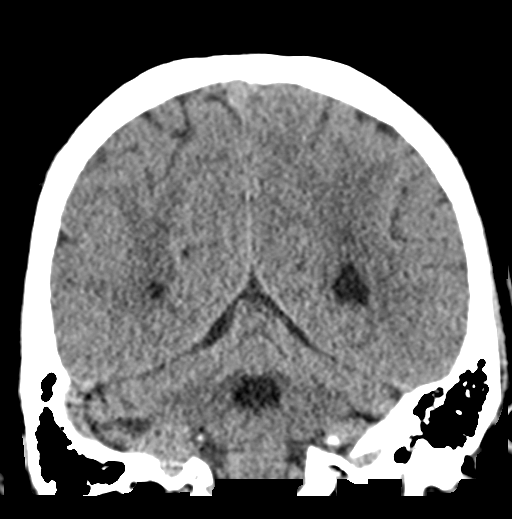
[im 28/63  brain]
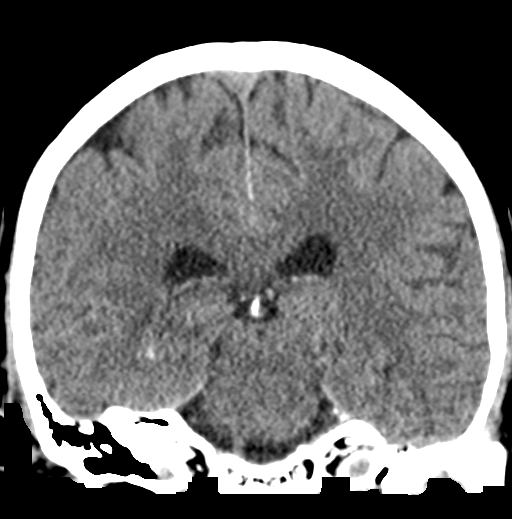
[im 35/63  brain]
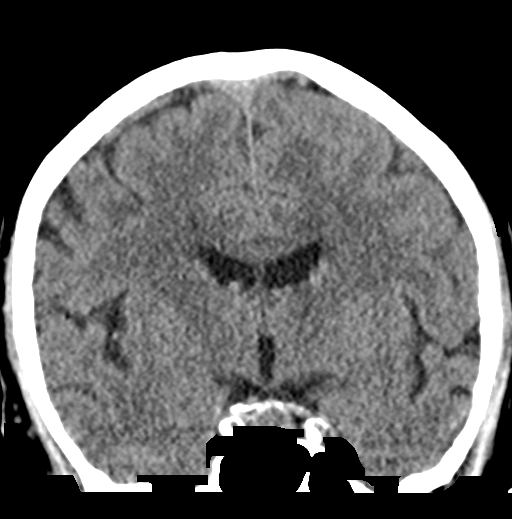

[Series 5: head 3.0 mpr sag · sagittal · 0.27mm/px · 3 of 56 slices shown]
[im 19/56  brain]
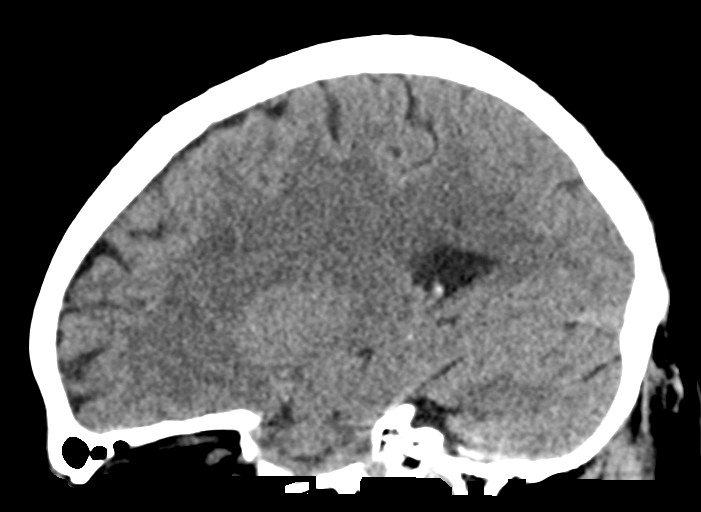
[im 28/56  brain]
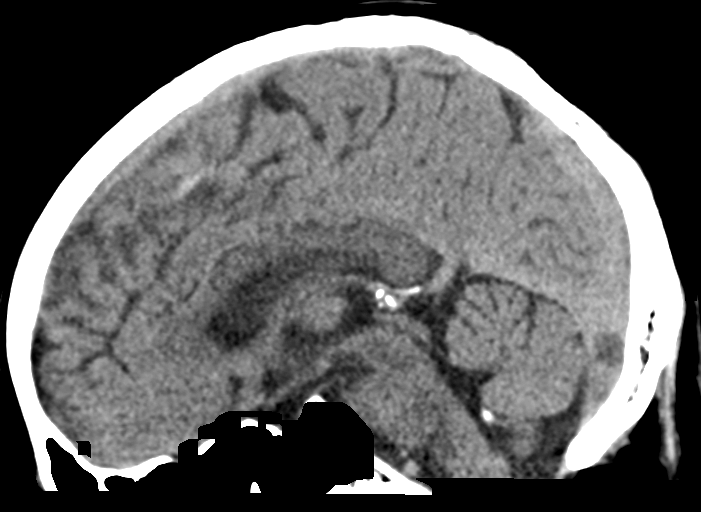
[im 37/56  brain]
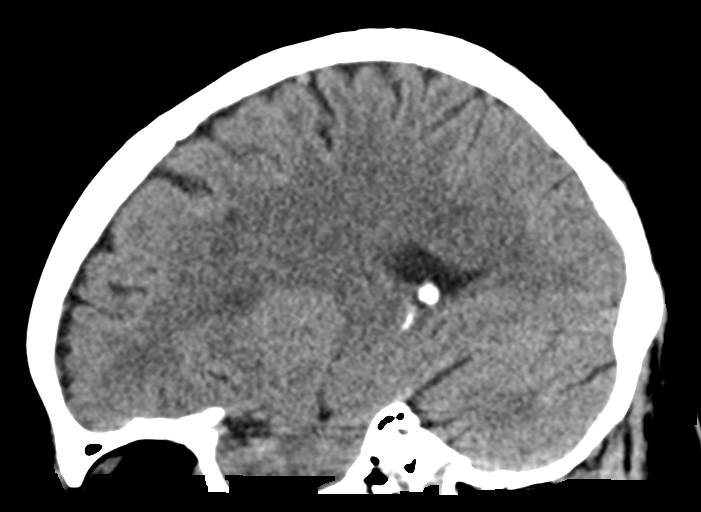

[15 of 45 positions shown; findings below may reference images not displayed]

FINDINGS: Brain: No evidence of acute infarction, hemorrhage, hydrocephalus,
extra-axial collection or mass lesion/mass effect.

Small old left posterior parietal infarct. Old left superior
cerebellar lacunar infarcts. Mild areas of predominantly subcortical
white matter hypoattenuation consistent with chronic microvascular
ischemic change.

Vascular: No hyperdense vessel or unexpected calcification.

Skull: Normal. Negative for fracture or focal lesion.

Sinuses/Orbits: Visualized globes and orbits are unremarkable. The
visualized sinuses and mastoid air cells are clear.

Other: None.
IMPRESSION: 1. No acute intracranial abnormalities.
2. Mild chronic microvascular ischemic change. Small old left
parietal and superior left cerebellar infarcts.

## 2020-09-10 ENCOUNTER — Other Ambulatory Visit: Payer: Self-pay | Admitting: *Deleted

## 2020-09-10 NOTE — Patient Outreach (Signed)
  [facility]) Care Management Chronic Special Needs Program    09/10/2020  Name: Harold Waters [place], [place]: November 11, 1945  MRN: [id number]   Mr. Faizan Geraci is enrolled in a chronic special needs plan for Diabetes.  [facility] care management will continue to provide services for this member through 11/01/20.  [facility] care management team will assume care 11/02/20.   Jacqlyn Larsen [facility], BSN [place], [place]

## 2020-09-23 ENCOUNTER — Encounter: Payer: Self-pay | Admitting: Internal Medicine

## 2020-09-23 ENCOUNTER — Other Ambulatory Visit: Payer: Self-pay

## 2020-09-23 ENCOUNTER — Ambulatory Visit (INDEPENDENT_AMBULATORY_CARE_PROVIDER_SITE_OTHER): Payer: HMO | Admitting: Internal Medicine

## 2020-09-23 DIAGNOSIS — G43109 Migraine with aura, not intractable, without status migrainosus: Secondary | ICD-10-CM | POA: Diagnosis not present

## 2020-09-23 DIAGNOSIS — H5319 Other subjective visual disturbances: Secondary | ICD-10-CM | POA: Diagnosis not present

## 2020-09-23 DIAGNOSIS — I10 Essential (primary) hypertension: Secondary | ICD-10-CM

## 2020-09-23 DIAGNOSIS — E118 Type 2 diabetes mellitus with unspecified complications: Secondary | ICD-10-CM | POA: Diagnosis not present

## 2020-09-23 DIAGNOSIS — R413 Other amnesia: Secondary | ICD-10-CM

## 2020-09-23 DIAGNOSIS — E559 Vitamin D deficiency, unspecified: Secondary | ICD-10-CM | POA: Diagnosis not present

## 2020-09-23 NOTE — Progress Notes (Signed)
Subjective:  Patient ID: Harold Waters, male    DOB: July 04, 1946  Age: 74 y.o. MRN: [id number]  CC: Follow-up (3 month F/U)   HPI Harold Waters presents for his distorted vision - s/p ophth evaluation F/u HTN - better w/Clonidine He lost wt, eating better, less salt  Outpatient Medications Prior to Visit  Medication Sig Dispense Refill  . aspirin EC 81 MG tablet Take 81 mg by mouth daily.    . Blood Glucose Monitoring Suppl (ONETOUCH VERIO) w/Device KIT 1 Units by Does not apply route daily as needed. 1 kit 1  . Cholecalciferol (VITAMIN D3) 50 MCG (2000 UT) capsule Take 1 capsule (2,000 Units total) by mouth daily. 100 capsule 3  . cloNIDine (CATAPRES) 0.1 MG tablet Take 1 tablet (0.1 mg total) by mouth 2 (two) times daily. 60 tablet 5  . clopidogrel (PLAVIX) 75 MG tablet Take 75 mg by mouth daily.    [name] glucose blood (ONETOUCH VERIO) test strip Use to check blood sugar daily 50 each 11  . isosorbide mononitrate (IMDUR) 120 MG 24 hr tablet TAKE 1 TABLET BY MOUTH EVERY DAY 90 tablet 1  . memantine (NAMENDA) 5 MG tablet TAKE 1 TABLET BY MOUTH TWICE A DAY 180 tablet 1  . Multiple Vitamins-Minerals (MENS MULTIVITAMIN PLUS) TABS Take 1 tablet by mouth at bedtime.     . nitroGLYCERIN (NITROSTAT) 0.4 MG SL tablet Place 1 tablet under tongue every 5 mins. DO NOT USE MORE THAN 3 TABS 25 tablet 5  . rivastigmine (EXELON) 1.5 MG capsule TAKE 1 CAPSULE BY MOUTH TWICE A DAY 180 capsule 0  . rosuvastatin (CRESTOR) 40 MG tablet TAKE 1 TABLET BY MOUTH EVERY DAY 90 tablet 1   No facility-administered medications prior to visit.    ROS: Review of Systems  Constitutional: Negative for appetite change, fatigue and unexpected weight change.  HENT: Negative for congestion, nosebleeds, sneezing, sore throat and trouble swallowing.   Eyes: Positive for visual disturbance. Negative for itching.  Respiratory: Negative for cough.   Cardiovascular: Negative for chest pain, palpitations and leg  swelling.  Gastrointestinal: Negative for abdominal distention, blood in stool, diarrhea and nausea.  Genitourinary: Negative for frequency and hematuria.  Musculoskeletal: Negative for back pain, gait problem, joint swelling and neck pain.  Skin: Negative for rash.  Neurological: Negative for dizziness, tremors, speech difficulty and weakness.  Psychiatric/Behavioral: Negative for agitation, dysphoric mood and sleep disturbance. The patient is not nervous/anxious.     Objective:  BP (!) 152/84 (BP Location: Left Arm)   Pulse 64   Temp 97.9 F (36.6 C) (Oral)   Wt 163 lb 9.6 oz (74.2 kg)   SpO2 92%   BMI 27.22 kg/m   BP Readings from Last 3 Encounters:  09/23/20 (!) 152/84  08/23/20 (!) 190/86  08/23/20 (!) 183/87    Wt Readings from Last 3 Encounters:  09/23/20 163 lb 9.6 oz (74.2 kg)  08/23/20 168 lb (76.2 kg)  08/23/20 167 lb 14.4 oz (76.2 kg)    Physical Exam Constitutional:      General: He is not in acute distress.    Appearance: He is well-developed.     Comments: NAD  Eyes:     Conjunctiva/sclera: Conjunctivae normal.     Pupils: Pupils are equal, round, and reactive to light.  Neck:     Thyroid: No thyromegaly.     Vascular: No JVD.  Cardiovascular:     Rate and Rhythm: Normal rate and regular rhythm.  Heart sounds: Normal heart sounds. No murmur heard.  No friction rub. No gallop.   Pulmonary:     Effort: Pulmonary effort is normal. No respiratory distress.     Breath sounds: Normal breath sounds. No wheezing or rales.  Chest:     Chest wall: No tenderness.  Abdominal:     General: Bowel sounds are normal. There is no distension.     Palpations: Abdomen is soft. There is no mass.     Tenderness: There is no abdominal tenderness. There is no guarding or rebound.  Musculoskeletal:        General: No tenderness. Normal range of motion.     Cervical back: Normal range of motion.  Lymphadenopathy:     Cervical: No cervical adenopathy.  Skin:     General: Skin is warm and dry.     Findings: No rash.  Neurological:     Mental Status: He is alert and oriented to person, place, and time.     Cranial Nerves: No cranial nerve deficit.     Motor: No abnormal muscle tone.     Coordination: Coordination normal.     Gait: Gait normal.     Deep Tendon Reflexes: Reflexes are normal and symmetric.  Psychiatric:        Behavior: Behavior normal.        Thought Content: Thought content normal.        Judgment: Judgment normal.     Lab Results  Component Value Date   WBC 6.2 07/03/2019   HGB 13.8 07/03/2019   HCT 41.8 07/03/2019   PLT 239.0 07/03/2019   GLUCOSE 99 08/23/2020   CHOL 156 05/03/2017   TRIG 150 (H) 05/03/2017   HDL 46 05/03/2017   LDLDIRECT 167 (H) 10/25/2019   LDLCALC 80 05/03/2017   ALT 19 08/23/2020   AST 17 08/23/2020   NA 141 08/23/2020   K 4.5 08/23/2020   CL 105 08/23/2020   CREATININE 1.52 (H) 08/23/2020   BUN 25 08/23/2020   CO2 28 08/23/2020   TSH 4.91 (H) 07/03/2019   PSA 3.69 12/24/2014   INR 0.99 11/28/2018   HGBA1C 7.6 (H) 08/23/2020    VAS [place] ABI WITH/WO TBI  Result Date: 08/23/2020 LOWER EXTREMITY DOPPLER STUDY Indications: Peripheral artery disease.  Vascular               Left CFA to AK popliteal bypass graft with vein on Interventions:         12/06/18. Performing Technologist: Ronal Fear RVS, RCS  Examination Guidelines: A complete evaluation includes at minimum, Doppler waveform signals and systolic blood pressure reading at the level of bilateral brachial, anterior tibial, and posterior tibial arteries, when vessel segments are accessible. Bilateral testing is considered an integral part of a complete examination. Photoelectric Plethysmograph (PPG) waveforms and toe systolic pressure readings are included as required and additional duplex testing as needed. Limited examinations for reoccurring indications may be performed as noted.  ABI Findings:  +---------+------------------+-----+---------+--------+ Right    Rt Pressure (mmHg)IndexWaveform Comment  +---------+------------------+-----+---------+--------+ Brachial 190                                      +---------+------------------+-----+---------+--------+ PTA      211               1.08 triphasic         +---------+------------------+-----+---------+--------+ DP  196               1.00 biphasic          +---------+------------------+-----+---------+--------+ Great Toe147               0.75                   +---------+------------------+-----+---------+--------+ +---------+------------------+-----+---------+-------+ Left     Lt Pressure (mmHg)IndexWaveform Comment +---------+------------------+-----+---------+-------+ Brachial 196                                     +---------+------------------+-----+---------+-------+ PTA      190               0.97 triphasic        +---------+------------------+-----+---------+-------+ DP       170               0.87 triphasic        +---------+------------------+-----+---------+-------+ Adair Patter               0.68                  +---------+------------------+-----+---------+-------+ +-------+-----------+-----------+------------+------------+ ABI/TBIToday's ABIToday's TBIPrevious ABIPrevious TBI +-------+-----------+-----------+------------+------------+ Right  1.08       0.75       1.21        0.70         +-------+-----------+-----------+------------+------------+ Left   0.97       0.68       1.21        0.67         +-------+-----------+-----------+------------+------------+ Bilateral ABIs appear essentially unchanged compared to prior study on 05/17/2020.  Summary: Right: Resting right ankle-brachial index is within normal range. No evidence of significant right lower extremity arterial disease. The right toe-brachial index is normal. Left: Resting left ankle-brachial index is  within normal range. No evidence of significant left lower extremity arterial disease. The left toe-brachial index is abnormal.  *See table(s) above for measurements and observations.  Electronically signed by Servando Snare MD on 08/23/2020 at 1:02:39 PM.   Final    VAS [place] LOWER EXTREMITY BYPASS GRAFT DUPLEX  Result Date: 08/23/2020 LOWER EXTREMITY ARTERIAL DUPLEX STUDY Indications: Peripheral artery disease. Patient complains of left hip              claudication when walking on an incline.  Vascular               Left CFA to AK popliteal bypass graft with vein on Interventions:         12/06/18. Current ABI:           R=1.08, L=0.97 Performing Technologist: Ronal Fear RVS, RCS  Examination Guidelines: A complete evaluation includes B-mode imaging, spectral Doppler, color Doppler, and power Doppler as needed of all accessible portions of each vessel. Bilateral testing is considered an integral part of a complete examination. Limited examinations for reoccurring indications may be performed as noted.  Left Graft #1: common femoral to above knee popliteal +-------------------+--------+--------------+---------+------------------------+                    PSV cm/sStenosis      Waveform Comments                 +-------------------+--------+--------------+---------+------------------------+ Inflow             113  biphasic                          +-------------------+--------+--------------+---------+------------------------+ Proximal           105                   biphasic                          Anastomosis                                                                +-------------------+--------+--------------+---------+------------------------+ Proximal Graft     125     50-70%        triphasicvelocity increased                                  stenosis               125-188                   +-------------------+--------+--------------+---------+------------------------+ Mid Graft          47                    biphasic                          +-------------------+--------+--------------+---------+------------------------+ Distal Graft       40                    biphasic                          +-------------------+--------+--------------+---------+------------------------+ Distal Anastomosis 53                    biphasic                          +-------------------+--------+--------------+---------+------------------------+ Outflow            72                    triphasic                         +-------------------+--------+--------------+---------+------------------------+   Summary: Left: Patent bypass graft with an increase in velocity in the proximal graft segment suggestive of a 50-70% stenosis.  See table(s) above for measurements and observations. Electronically signed by Servando Snare MD on 08/23/2020 at 1:02:50 PM.    Final     Assessment & Plan:    Walker Kehr, MD

## 2020-09-23 NOTE — Assessment & Plan Note (Signed)
Lion's Mane Mushroom

## 2020-09-23 NOTE — Assessment & Plan Note (Addendum)
BP is better w/Clonidine He lost wt, eating better, less salt

## 2020-09-23 NOTE — Patient Instructions (Signed)
You can try Lion's Mane Mushroom extract or capsules for memory   

## 2020-09-23 NOTE — Assessment & Plan Note (Signed)
Vit d 

## 2020-09-23 NOTE — Assessment & Plan Note (Signed)
Better w/BP control

## 2020-09-23 NOTE — Assessment & Plan Note (Signed)
Cont w/better diet, wt loss

## 2020-09-23 NOTE — Assessment & Plan Note (Signed)
Better F/u w/Ophth - cataracts

## 2020-09-24 NOTE — Progress Notes (Signed)
This encounter was created in error - please disregard.

## 2020-10-21 DIAGNOSIS — H02834 Dermatochalasis of left upper eyelid: Secondary | ICD-10-CM | POA: Diagnosis not present

## 2020-10-21 DIAGNOSIS — H02831 Dermatochalasis of right upper eyelid: Secondary | ICD-10-CM | POA: Diagnosis not present

## 2020-10-21 DIAGNOSIS — H2513 Age-related nuclear cataract, bilateral: Secondary | ICD-10-CM | POA: Diagnosis not present

## 2020-10-21 DIAGNOSIS — I10 Essential (primary) hypertension: Secondary | ICD-10-CM | POA: Diagnosis not present

## 2020-10-21 DIAGNOSIS — H43813 Vitreous degeneration, bilateral: Secondary | ICD-10-CM | POA: Diagnosis not present

## 2020-10-29 ENCOUNTER — Ambulatory Visit: Payer: HMO | Admitting: Neurology

## 2020-10-29 ENCOUNTER — Other Ambulatory Visit: Payer: Self-pay

## 2020-10-29 ENCOUNTER — Encounter: Payer: Self-pay | Admitting: Neurology

## 2020-10-29 VITALS — BP 156/82 | HR 70 | Ht 65.0 in | Wt 160.2 lb

## 2020-10-29 DIAGNOSIS — G4700 Insomnia, unspecified: Secondary | ICD-10-CM

## 2020-10-29 DIAGNOSIS — F0150 Vascular dementia without behavioral disturbance: Secondary | ICD-10-CM

## 2020-10-29 MED ORDER — MEMANTINE HCL 5 MG PO TABS: 5.0000 mg | ORAL_TABLET | Freq: Two times a day (BID) | ORAL | 3 refills | Status: DC

## 2020-10-29 MED ORDER — MIRTAZAPINE 15 MG PO TABS
15.0000 mg | ORAL_TABLET | Freq: Every day | ORAL | 11 refills | Status: DC
Start: 2020-10-29 — End: 2021-05-01

## 2020-10-29 NOTE — Progress Notes (Signed)
NEUROLOGY FOLLOW UP OFFICE NOTE  Harold Waters [id number] 1945/11/08  HISTORY OF PRESENT ILLNESS: I had the pleasure of seeing Harold Waters in follow-up in the neurology clinic on 10/29/2020.  The patient was last seen 7 months ago for mild dementia. MMSE 26/30 in 03/2020. He had side effects of nausea, vomiting, and diarrhea on the Donepezil. He was switched to Rivastigmine in August, however also had nausea. He was started on Memantine 32m BID by Dr. PAlain Marionin September but after finishing the bottle, he did not get another refill. He denies any side effects on the Memantine and states he stopped it because he has 12 medications to take and probably misplaced that one. He is reporting that his memory is going down so fast. He would be driving and miss his exit more frequently. This would make his very sad and cry. His lives with his wife, his daughter lives a block away. His daughter has been trying to help with medications, fixing his pillbox, but he still forgets sometimes. He switched all bills to autopay. He forgets what he went to get. He has difficulties remembering names. His daughter is trying to get him out of work. He has some headaches which he attributes to his eyesight. He has occasional dizziness when bending down, no falls. He has difficulty with sleep maintenance, sleeping at most 3-4 hours then awake after. He notes anxiety. No paranoia or hallucinations.   History on Initial Assessment 08/23/2019: This is a pleasant 74year old right-handed man with a history of hypertension, hyperlipidemia, PAD, presenting for evaluation of memory loss. He feels that memory changes started soon after his surgery in February 2020 where he had a left femoral to AK popliteal artery bypass under general anesthesia.  Prior to this, he had minor memory changes, but soon after surgery he noticed that he was unable to do things that he could do easily in the past such as math. He used to do large  numbers in the back of his head, but has more difficulty now. He lives with his wife who constantly says something about his memory saying "you forget everything," which upsets him since this was not happening prior to the surgery. He denies getting lost driving but has missed his exit a few times because he is not focusing or forgets where he is going. He has forgotten bill payments and has had late charges (new as well). He has been forgetting his medications, his wife now helps him. He loses things constantly, his phone is lost all the time. He has put things for the pantry in the freezer. He denies leaving the stove on. He has word-finding difficulties and forgets names more. 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 He has also had a change in sleep pattern, he has always had sleep difficulties getting an average of 4 hours of sleep, but since the surgery he would only get 2-3 hours of sleep. With Trazodone 1 tab qhs he gets 4 hours. He has daytime drowsiness and would drag his feet because he is so tired. Mood is good. No family history of dementia, no history of significant head injuries or alcohol use.   He gets dizzy when walking or changing clothes, no falls. He recalls an episode of double vision 3-4 years ago, this has resolved. He has occasional difficulty swallowing  water. He has some numbness in both hands. He denies any headaches, focal numbness/tingling/weakness, bowel/bladder dysfunction, anosmia, or tremors. He denies any prior history of stroke, head CT done in 07/2019 showed an old left posterior parietal infarct and old left superior cerebellar lacunar infarcts, mild chronic microvascular disease, no acute change.     Laboratory Data: Lab Results  Component Value Date   TSH 4.91 (H) 07/03/2019   Free T4 0.77  Lab Results  Component Value Date   [id number]  154 07/03/2019      PAST MEDICAL HISTORY: Past Medical History:  Diagnosis Date  . Angina   . Chronic stomach ulcer    "get them off and on" (09/19/2018)  . Coronary atherosclerosis of unspecified type of vessel, native or graft   . Diverticulosis   . DVT of lower extremity (deep venous thrombosis) ([place]) ~ 2010   LLE  . Erosive gastritis   . Essential hypertension, benign   . GERD (gastroesophageal reflux disease)   . Headache(784.0) 02/18/12   "lately"  . History of kidney stones   . Internal hemorrhoids   . Lower back pain   . Myocardial infarction ([place]) 1997  . Osteoarthritis   . Other and unspecified hyperlipidemia   . PAD (peripheral artery disease) ([facility])    with ABI's 0.8 on the right and 0.86 on the left  . Pneumonia 1957  . PSVT (paroxysmal supraventricular tachycardia) ([place]) 02/18/12  . Shortness of breath    "lying down"  . Sleep apnea    does not wear c-pap; pt does not recall this hx on 09/19/2018    MEDICATIONS: Current Outpatient Medications on File Prior to Visit  Medication Sig Dispense Refill  . aspirin EC 81 MG tablet Take 81 mg by mouth daily.    . Blood Glucose Monitoring Suppl (ONETOUCH VERIO) w/Device KIT 1 Units by Does not apply route daily as needed. 1 kit 1  . Cholecalciferol (VITAMIN D3) 50 MCG (2000 UT) capsule Take 1 capsule (2,000 Units total) by mouth daily. 100 capsule 3  . cloNIDine (CATAPRES) 0.1 MG tablet Take 1 tablet (0.1 mg total) by mouth 2 (two) times daily. 60 tablet 5  . clopidogrel (PLAVIX) 75 MG tablet Take 75 mg by mouth daily.    [name] glucose blood (ONETOUCH VERIO) test strip Use to check blood sugar daily 50 each 11  . isosorbide mononitrate (IMDUR) 120 MG 24 hr tablet TAKE 1 TABLET BY MOUTH EVERY DAY 90 tablet 1  . memantine (NAMENDA) 5 MG tablet TAKE 1 TABLET BY MOUTH TWICE A DAY 180 tablet 1  . Multiple Vitamins-Minerals (MENS MULTIVITAMIN PLUS) TABS Take 1 tablet by mouth at bedtime.     . nitroGLYCERIN (NITROSTAT) 0.4 MG  SL tablet Place 1 tablet under tongue every 5 mins. DO NOT USE MORE THAN 3 TABS 25 tablet 5  . rivastigmine (EXELON) 1.5 MG capsule TAKE 1 CAPSULE BY MOUTH TWICE A DAY 180 capsule 0  . rosuvastatin (CRESTOR) 40 MG tablet TAKE 1 TABLET BY MOUTH EVERY DAY 90 tablet 1   No current facility-administered medications on file prior to visit.    ALLERGIES: Allergies  Allergen Reactions  . Eggs Or Egg-Derived Products Itching  . Aricept [Donepezil]     n/v  . Coreg [Carvedilol]     fatigue    FAMILY HISTORY: Family History  Problem Relation Age of Onset  . Ulcers Father        had stomach issues, not sure what happened  .  Coronary artery disease Brother        male 1st degree relative <50  . Colon cancer Neg Hx   . Esophageal cancer Neg Hx   . Rectal cancer Neg Hx   . Stomach cancer Neg Hx     SOCIAL HISTORY: Social History   Socioeconomic History  . Marital status: Married    Spouse name: Not on file  . Number of children: 2  . Years of education: Not on file  . Highest education level: Not on file  Occupational History  . Occupation: retired  Tobacco Use  . Smoking status: Former Smoker    Packs/day: 1.00    Years: 40.00    Pack years: 40.00    Types: Cigarettes    Quit date: 04/03/2010    Years since quitting: 10.5  . Smokeless tobacco: Never Used  Vaping Use  . Vaping Use: Never used  Substance and Sexual Activity  . Alcohol use: Not Currently    Comment: "used to drink when I was young; quit ~ 1980's"  . Drug use: Never  . Sexual activity: Yes  Other Topics Concern  . Not on file  Social History Narrative   Right handed   Two story home   4 children   Social Determinants of Health   Financial Resource Strain: Not on file  Food Insecurity: No Food Insecurity  . Worried About Charity fundraiser in the Last Year: Never true  . Ran Out of Food in the Last Year: Never true  Transportation Needs: No Transportation Needs  . Lack of Transportation (Medical):  No  . Lack of Transportation (Non-Medical): No  Physical Activity: Not on file  Stress: Not on file  Social Connections: Not on file  Intimate Partner Violence: Not on file     PHYSICAL EXAM: Vitals:   10/29/20 1449  BP: (!) 156/82  Pulse: 70  SpO2: 97%   General: No acute distress Head:  Normocephalic/atraumatic Skin/Extremities: No rash, no edema Neurological Exam: alert and oriented to person, place, and time. No aphasia or dysarthria. Fund of knowledge is appropriate.  Recent and remote memory are intact. 2/3 delayed recall. Attention and concentration are reduced, 2/5 serial 7s. Cranial nerves: Pupils equal, round. Extraocular movements intact with no nystagmus. Visual fields full.  No facial asymmetry.  Motor: Bulk and tone normal, muscle strength 5/5 throughout with no pronator drift.   Finger to nose testing intact.  Gait narrow-based and steady, no ataxia   IMPRESSION: This is a pleasant 74 yo RH man with a history of hypertension, hyperlipidemia, PAD, with worsening memory. His neurological exam is normal, MMSE 26/30 in 03/2020. He is reporting increasing difficulty with complex tasks. MRI brain showed mild diffuse atrophy and moderate chronic microvascular disease, chronic small strokes in the left occipital lobe, bilateral cerebellar hemispheres and thalami. Symptoms like due to vascular dementia, he had side effects on cholinesterase inhibitors, restart Memantine 50m BID. He is also reporting poor sleep, anxiety, and depression, start mirtazapine 139mqhs. Side effects discussed. He tried Trazodone in the past. Consider sleep study on next visit if no improvement. Continue to monitor driving, advised increased supervision with medications. Continue control of vascular risk factors, physical exercise, and brain stimulation exercises for brain health. Follow-up in 6 months, he knows to call for any changes.    Thank you for allowing me to participate in his care.  Please do not  hesitate to call for any questions or concerns.   KaEllouise Newer  M.D.   CC: Dr. Alain Marion

## 2020-10-29 NOTE — Patient Instructions (Signed)
1. Restart Memantine (Namenda) 5mg : Take 1 tablet twice a day  2. Start mirtazapine 15mg : take 1 tablet every night  3. Follow-up in 6 months, call for any changes   FALL PRECAUTIONS: Be cautious when walking. Scan the area for obstacles that may increase the risk of trips and falls. When getting up in the mornings, sit up at the edge of the bed for a few minutes before getting out of bed. Consider elevating the bed at the head end to avoid drop of blood pressure when getting up. Walk always in a well-lit room (use night lights in the walls). Avoid area rugs or power cords from appliances in the middle of the walkways. Use a walker or a cane if necessary and consider physical therapy for balance exercise. Get your eyesight checked regularly.  FINANCIAL OVERSIGHT: Supervision, especially oversight when making financial decisions or transactions is also recommended.  HOME SAFETY: Consider the safety of the kitchen when operating appliances like stoves, microwave oven, and blender. Consider having supervision and share cooking responsibilities until no longer able to participate in those. Accidents with firearms and other hazards in the house should be identified and addressed as well.  DRIVING: Regarding driving, in patients with progressive memory problems, driving will be impaired. We advise to have someone else do the driving if trouble finding directions or if minor accidents are reported. Independent driving assessment is available to determine safety of driving.  ABILITY TO BE LEFT ALONE: If patient is unable to contact 911 operator, consider using LifeLine, or when the need is there, arrange for someone to stay with patients. Smoking is a fire hazard, consider supervision or cessation. Risk of wandering should be assessed by caregiver and if detected at any point, supervision and safe proof recommendations should be instituted.  MEDICATION SUPERVISION: Inability to self-administer medication  needs to be constantly addressed. Implement a mechanism to ensure safe administration of the medications.  RECOMMENDATIONS FOR ALL PATIENTS WITH MEMORY PROBLEMS: 1. Continue to exercise (Recommend 30 minutes of walking everyday, or 3 hours every week) 2. Increase social interactions - continue going to [place] and enjoy social gatherings with friends and family 3. Eat healthy, avoid fried foods and eat more fruits and vegetables 4. Maintain adequate blood pressure, blood sugar, and blood cholesterol level. Reducing the risk of stroke and cardiovascular disease also helps promoting better memory. 5. Avoid stressful situations. Live a simple life and avoid aggravations. Organize your time and prepare for the next day in anticipation. 6. Sleep well, avoid any interruptions of sleep and avoid any distractions in the bedroom that may interfere with adequate sleep quality 7. Avoid sugar, avoid sweets as there is a strong link between excessive sugar intake, diabetes, and cognitive impairment The Mediterranean diet has been shown to help patients reduce the risk of progressive memory disorders and reduces cardiovascular risk. This includes eating fish, eat fruits and green leafy vegetables, nuts like almonds and hazelnuts, walnuts, and also use olive oil. Avoid fast foods and fried foods as much as possible. Avoid sweets and sugar as sugar use has been linked to worsening of memory function.  There is always a concern of gradual progression of memory problems. If this is the case, then we may need to adjust level of care according to patient needs. Support, both to the patient and caregiver, should then be put into place.

## 2020-11-06 ENCOUNTER — Other Ambulatory Visit: Payer: Self-pay | Admitting: *Deleted

## 2020-11-08 ENCOUNTER — Telehealth: Payer: Self-pay

## 2020-11-08 NOTE — Telephone Encounter (Signed)
   [facility] Pre-operative Risk Assessment    [place]  Request for surgical clearance:  1. What type of surgery is being performed? Cataract Extraction with intraocular lens implantation of the right eye, followed by the left eye.   2. When is this surgery scheduled? 12/17/20, 12/24/20   3. What type of clearance is required (medical clearance vs. Pharmacy clearance to hold med vs. Both)? Medical  4. Are there any medications that need to be held prior to surgery and how long? None requested   5. Practice name and name of physician performing surgery? Dr. Vevelyn Royals, [facility] [place]   6. What is the office phone number? [phone]   7.   What is the office fax number? [phone]  8.   Anesthesia type (None, local, MAC, general) ? Topical anesthesia with IV medication   Alvy Beal Apple 11/08/2020, 1:57 PM  _________________________________________________________________   (provider comments below)

## 2020-11-08 NOTE — Telephone Encounter (Signed)
   Primary Cardiologist: [name], MD  Chart reviewed as part of pre-operative protocol coverage. Cataract extractions are recognized in guidelines as low risk surgeries that do not typically require specific preoperative testing or holding of blood thinner therapy. Therefore, given past medical history and time since last visit, based on ACC/AHA guidelines, [place] would be at acceptable risk for the planned procedure without further cardiovascular testing.   I will route this recommendation to the requesting party via Epic fax function and remove from pre-op pool.  Please call with questions.  Abigail Butts, PA-C 11/08/2020, 2:43 PM

## 2020-12-17 DIAGNOSIS — H2511 Age-related nuclear cataract, right eye: Secondary | ICD-10-CM | POA: Diagnosis not present

## 2020-12-17 DIAGNOSIS — H2512 Age-related nuclear cataract, left eye: Secondary | ICD-10-CM | POA: Diagnosis not present

## 2020-12-17 DIAGNOSIS — H25012 Cortical age-related cataract, left eye: Secondary | ICD-10-CM | POA: Diagnosis not present

## 2020-12-24 DIAGNOSIS — H25011 Cortical age-related cataract, right eye: Secondary | ICD-10-CM | POA: Diagnosis not present

## 2020-12-24 DIAGNOSIS — H2511 Age-related nuclear cataract, right eye: Secondary | ICD-10-CM | POA: Diagnosis not present

## 2020-12-31 ENCOUNTER — Ambulatory Visit: Payer: HMO | Admitting: *Deleted

## 2021-01-29 ENCOUNTER — Encounter: Payer: Self-pay | Admitting: Internal Medicine

## 2021-01-29 ENCOUNTER — Other Ambulatory Visit: Payer: Self-pay

## 2021-01-29 ENCOUNTER — Ambulatory Visit (INDEPENDENT_AMBULATORY_CARE_PROVIDER_SITE_OTHER): Payer: HMO | Admitting: Internal Medicine

## 2021-01-29 ENCOUNTER — Ambulatory Visit: Payer: HMO

## 2021-01-29 DIAGNOSIS — R1032 Left lower quadrant pain: Secondary | ICD-10-CM

## 2021-01-29 DIAGNOSIS — K5901 Slow transit constipation: Secondary | ICD-10-CM

## 2021-01-29 DIAGNOSIS — K5790 Diverticulosis of intestine, part unspecified, without perforation or abscess without bleeding: Secondary | ICD-10-CM

## 2021-01-29 DIAGNOSIS — I10 Essential (primary) hypertension: Secondary | ICD-10-CM | POA: Diagnosis not present

## 2021-01-29 DIAGNOSIS — K5900 Constipation, unspecified: Secondary | ICD-10-CM

## 2021-01-29 DIAGNOSIS — R972 Elevated prostate specific antigen [PSA]: Secondary | ICD-10-CM

## 2021-01-29 DIAGNOSIS — N183 Chronic kidney disease, stage 3 unspecified: Secondary | ICD-10-CM | POA: Diagnosis not present

## 2021-01-29 HISTORY — DX: Constipation, unspecified: K59.00

## 2021-01-29 MED ORDER — CLONIDINE HCL 0.1 MG PO TABS: ORAL_TABLET | ORAL | 11 refills | Status: DC

## 2021-01-29 MED ORDER — POLYETHYLENE GLYCOL 3350 17 GM/SCOOP PO POWD
17.0000 g | Freq: Two times a day (BID) | ORAL | 3 refills | Status: DC | PRN
Start: 2021-01-29 — End: 2024-03-30

## 2021-01-29 NOTE — Assessment & Plan Note (Addendum)
Self - limiting diverticulitis vs other GI ref CBC  ordered

## 2021-01-29 NOTE — Progress Notes (Signed)
Subjective:  Patient ID: Harold Waters, male    DOB: Sep 20, 1946  Age: 75 y.o. MRN: [id number]  CC: Abdominal Pain (Pt states x's 3 weeks)   HPI [facility] presents for abd bloating, constipation x 3 weeks. BM q 3 days. No pain now.  Outpatient Medications Prior to Visit  Medication Sig Dispense Refill  . aspirin EC 81 MG tablet Take 81 mg by mouth daily.    . Blood Glucose Monitoring Suppl (ONETOUCH VERIO) w/Device KIT 1 Units by Does not apply route daily as needed. 1 kit 1  . Cholecalciferol (VITAMIN D3) 50 MCG (2000 UT) capsule Take 1 capsule (2,000 Units total) by mouth daily. 100 capsule 3  . cloNIDine (CATAPRES) 0.1 MG tablet Take 1 tablet (0.1 mg total) by mouth 2 (two) times daily. 60 tablet 5  . glucose blood (ONETOUCH VERIO) test strip Use to check blood sugar daily 50 each 11  . memantine (NAMENDA) 5 MG tablet Take 1 tablet (5 mg total) by mouth 2 (two) times daily. 180 tablet 3  . mirtazapine (REMERON) 15 MG tablet Take 1 tablet (15 mg total) by mouth at bedtime. 30 tablet 11  . Multiple Vitamins-Minerals (MENS MULTIVITAMIN PLUS) TABS Take 1 tablet by mouth at bedtime.     . nitroGLYCERIN (NITROSTAT) 0.4 MG SL tablet Place 1 tablet under tongue every 5 mins. DO NOT USE MORE THAN 3 TABS 25 tablet 5  . rosuvastatin (CRESTOR) 40 MG tablet TAKE 1 TABLET BY MOUTH EVERY DAY 90 tablet 1   No facility-administered medications prior to visit.    ROS: Review of Systems  Constitutional: Positive for unexpected weight change. Negative for appetite change and fatigue.  HENT: Negative for congestion, nosebleeds, sneezing, sore throat and trouble swallowing.   Eyes: Negative for itching and visual disturbance.  Respiratory: Negative for cough.   Cardiovascular: Negative for chest pain, palpitations and leg swelling.  Gastrointestinal: Positive for abdominal distention, abdominal pain and constipation. Negative for blood in stool, diarrhea, nausea, rectal pain and  vomiting.  Genitourinary: Negative for frequency and hematuria.  Musculoskeletal: Negative for back pain, gait problem, joint swelling and neck pain.  Skin: Negative for rash.  Neurological: Negative for dizziness, tremors, speech difficulty and weakness.  Psychiatric/Behavioral: Negative for agitation, dysphoric mood, sleep disturbance and suicidal ideas. The patient is not nervous/anxious.     Objective:  BP (!) 198/92 (BP Location: Left Arm)   Pulse 69   Temp 97.9 F (36.6 C) (Oral)   Ht 5' 5"  (1.651 m)   Wt 154 lb 6.4 oz (70 kg)   SpO2 96%   BMI 25.69 kg/m   BP Readings from Last 3 Encounters:  01/29/21 (!) 198/92  10/29/20 (!) 156/82  09/23/20 (!) 152/84    Wt Readings from Last 3 Encounters:  01/29/21 154 lb 6.4 oz (70 kg)  10/29/20 160 lb 3.2 oz (72.7 kg)  09/23/20 163 lb 9.3 oz (74.2 kg)    Physical Exam Constitutional:      General: He is not in acute distress.    Appearance: He is well-developed.     Comments: NAD  Eyes:     Conjunctiva/sclera: Conjunctivae normal.     Pupils: Pupils are equal, round, and reactive to light.  Neck:     Thyroid: No thyromegaly.     Vascular: No JVD.  Cardiovascular:     Rate and Rhythm: Normal rate and regular rhythm.     Heart sounds: Normal heart sounds. No murmur heard.  No friction rub. No gallop.   Pulmonary:     Effort: Pulmonary effort is normal. No respiratory distress.     Breath sounds: Normal breath sounds. No wheezing or rales.  Chest:     Chest wall: No tenderness.  Abdominal:     General: Bowel sounds are normal. There is no distension.     Palpations: Abdomen is soft. There is no mass.     Tenderness: There is no abdominal tenderness. There is no guarding or rebound.  Musculoskeletal:        General: No tenderness. Normal range of motion.     Cervical back: Normal range of motion.  Lymphadenopathy:     Cervical: No cervical adenopathy.  Skin:    General: Skin is warm and dry.     Findings: No rash.   Neurological:     Mental Status: He is alert and oriented to person, place, and time.     Cranial Nerves: No cranial nerve deficit.     Motor: No abnormal muscle tone.     Coordination: Coordination normal.     Gait: Gait normal.     Deep Tendon Reflexes: Reflexes are normal and symmetric.  Psychiatric:        Behavior: Behavior normal.        Thought Content: Thought content normal.        Judgment: Judgment normal.    Rectal - declined Lab Results  Component Value Date   WBC 6.2 07/03/2019   HGB 13.8 07/03/2019   HCT 41.8 07/03/2019   PLT 239.0 07/03/2019   GLUCOSE 99 08/23/2020   CHOL 156 05/03/2017   TRIG 150 (H) 05/03/2017   HDL 46 05/03/2017   LDLDIRECT 167 (H) 10/25/2019   LDLCALC 80 05/03/2017   ALT 19 08/23/2020   AST 17 08/23/2020   NA 141 08/23/2020   K 4.5 08/23/2020   CL 105 08/23/2020   CREATININE 1.52 (H) 08/23/2020   BUN 25 08/23/2020   CO2 28 08/23/2020   TSH 4.91 (H) 07/03/2019   PSA 3.69 12/24/2014   INR 0.99 11/28/2018   HGBA1C 7.6 (H) 08/23/2020    VAS [place] ABI WITH/WO TBI  Result Date: 08/23/2020 LOWER EXTREMITY DOPPLER STUDY Indications: Peripheral artery disease.  Vascular               Left CFA to AK popliteal bypass graft with vein on Interventions:         12/06/18. Performing Technologist: Ronal Fear RVS, RCS  Examination Guidelines: A complete evaluation includes at minimum, Doppler waveform signals and systolic blood pressure reading at the level of bilateral brachial, anterior tibial, and posterior tibial arteries, when vessel segments are accessible. Bilateral testing is considered an integral part of a complete examination. Photoelectric Plethysmograph (PPG) waveforms and toe systolic pressure readings are included as required and additional duplex testing as needed. Limited examinations for reoccurring indications may be performed as noted.  ABI Findings: +---------+------------------+-----+---------+--------+ Right    Rt Pressure  (mmHg)IndexWaveform Comment  +---------+------------------+-----+---------+--------+ Brachial 190                                      +---------+------------------+-----+---------+--------+ PTA      211               1.08 triphasic         +---------+------------------+-----+---------+--------+ DP       196  1.00 biphasic          +---------+------------------+-----+---------+--------+ Great Toe147               0.75                   +---------+------------------+-----+---------+--------+ +---------+------------------+-----+---------+-------+ Left     Lt Pressure (mmHg)IndexWaveform Comment +---------+------------------+-----+---------+-------+ Brachial 196                                     +---------+------------------+-----+---------+-------+ PTA      190               0.97 triphasic        +---------+------------------+-----+---------+-------+ DP       170               0.87 triphasic        +---------+------------------+-----+---------+-------+ Adair Patter               0.68                  +---------+------------------+-----+---------+-------+ +-------+-----------+-----------+------------+------------+ ABI/TBIToday's ABIToday's TBIPrevious ABIPrevious TBI +-------+-----------+-----------+------------+------------+ Right  1.08       0.75       1.21        0.70         +-------+-----------+-----------+------------+------------+ Left   0.97       0.68       1.21        0.67         +-------+-----------+-----------+------------+------------+ Bilateral ABIs appear essentially unchanged compared to prior study on 05/17/2020.  Summary: Right: Resting right ankle-brachial index is within normal range. No evidence of significant right lower extremity arterial disease. The right toe-brachial index is normal. Left: Resting left ankle-brachial index is within normal range. No evidence of significant left lower extremity arterial  disease. The left toe-brachial index is abnormal.  *See table(s) above for measurements and observations.  Electronically signed by Servando Snare MD on 08/23/2020 at 1:02:39 PM.   Final    VAS [place] LOWER EXTREMITY BYPASS GRAFT DUPLEX  Result Date: 08/23/2020 LOWER EXTREMITY ARTERIAL DUPLEX STUDY Indications: Peripheral artery disease. Patient complains of left hip              claudication when walking on an incline.  Vascular               Left CFA to AK popliteal bypass graft with vein on Interventions:         12/06/18. Current ABI:           R=1.08, L=0.97 Performing Technologist: Ronal Fear RVS, RCS  Examination Guidelines: A complete evaluation includes B-mode imaging, spectral Doppler, color Doppler, and power Doppler as needed of all accessible portions of each vessel. Bilateral testing is considered an integral part of a complete examination. Limited examinations for reoccurring indications may be performed as noted.  Left Graft #1: common femoral to above knee popliteal +-------------------+--------+--------------+---------+------------------------+                    PSV cm/sStenosis      Waveform Comments                 +-------------------+--------+--------------+---------+------------------------+ Inflow             113                   biphasic                          +-------------------+--------+--------------+---------+------------------------+  Proximal           105                   biphasic                          Anastomosis                                                                +-------------------+--------+--------------+---------+------------------------+ Proximal Graft     125     50-70%        triphasicvelocity increased                                  stenosis               125-188                  +-------------------+--------+--------------+---------+------------------------+ Mid Graft          47                    biphasic                           +-------------------+--------+--------------+---------+------------------------+ Distal Graft       40                    biphasic                          +-------------------+--------+--------------+---------+------------------------+ Distal Anastomosis 53                    biphasic                          +-------------------+--------+--------------+---------+------------------------+ Outflow            72                    triphasic                         +-------------------+--------+--------------+---------+------------------------+   Summary: Left: Patent bypass graft with an increase in velocity in the proximal graft segment suggestive of a 50-70% stenosis.  See table(s) above for measurements and observations. Electronically signed by Servando Snare MD on 08/23/2020 at 1:02:50 PM.    Final     Assessment & Plan:    Walker Kehr, MD

## 2021-01-29 NOTE — Assessment & Plan Note (Addendum)
Self - limiting diverticulitis vs other GI ref -- Dr Fuller Plan Miralax prn

## 2021-01-29 NOTE — Assessment & Plan Note (Signed)
Clonidine - increase to tid Check BP at home Labs - CMET, TSH ordered

## 2021-01-29 NOTE — Assessment & Plan Note (Signed)
GI ref - Dr Fuller Plan

## 2021-01-30 ENCOUNTER — Other Ambulatory Visit (INDEPENDENT_AMBULATORY_CARE_PROVIDER_SITE_OTHER): Payer: HMO

## 2021-01-30 DIAGNOSIS — E785 Hyperlipidemia, unspecified: Secondary | ICD-10-CM

## 2021-01-30 DIAGNOSIS — R7309 Other abnormal glucose: Secondary | ICD-10-CM | POA: Diagnosis not present

## 2021-01-30 DIAGNOSIS — N320 Bladder-neck obstruction: Secondary | ICD-10-CM | POA: Diagnosis not present

## 2021-01-30 LAB — COMPREHENSIVE METABOLIC PANEL
ALT: 13 U/L (ref 0–53)
AST: 12 U/L (ref 0–37)
Albumin: 4.2 g/dL (ref 3.5–5.2)
Alkaline Phosphatase: 74 U/L (ref 39–117)
BUN: 25 mg/dL — ABNORMAL HIGH (ref 6–23)
CO2: 31 mEq/L (ref 19–32)
Calcium: 9.4 mg/dL (ref 8.4–10.5)
Chloride: 102 mEq/L (ref 96–112)
Creatinine, Ser: 1.58 mg/dL — ABNORMAL HIGH (ref 0.40–1.50)
GFR: 42.63 mL/min — ABNORMAL LOW (ref >60.00)
Glucose, Bld: 137 mg/dL — ABNORMAL HIGH (ref 70–99)
Potassium: 4.1 mEq/L (ref 3.5–5.1)
Sodium: 140 mEq/L (ref 135–145)
Total Bilirubin: 0.5 mg/dL (ref 0.2–1.2)
Total Protein: 6.3 g/dL (ref 6.0–8.3)

## 2021-01-30 LAB — HEPATIC FUNCTION PANEL
ALT: 13 U/L (ref 0–53)
AST: 12 U/L (ref 0–37)
Albumin: 4.2 g/dL (ref 3.5–5.2)
Alkaline Phosphatase: 74 U/L (ref 39–117)
Bilirubin, Direct: 0.1 mg/dL (ref 0.0–0.3)
Total Bilirubin: 0.5 mg/dL (ref 0.2–1.2)
Total Protein: 6.3 g/dL (ref 6.0–8.3)

## 2021-01-30 LAB — LIPID PANEL
Cholesterol: 276 mg/dL — ABNORMAL HIGH (ref 0–200)
HDL: 49.3 mg/dL (ref >39.00)
LDL Cholesterol: 198 mg/dL — ABNORMAL HIGH (ref 0–99)
NonHDL: 226.57
Total CHOL/HDL Ratio: 6
Triglycerides: 145 mg/dL (ref 0.0–149.0)
VLDL: 29 mg/dL (ref 0.0–40.0)

## 2021-01-30 LAB — PSA: PSA: 10.25 ng/mL — ABNORMAL HIGH (ref 0.10–4.00)

## 2021-01-30 LAB — T4, FREE: Free T4: 0.93 ng/dL (ref 0.60–1.60)

## 2021-01-30 LAB — HEMOGLOBIN A1C: Hgb A1c MFr Bld: 7.2 % — ABNORMAL HIGH (ref 4.6–6.5)

## 2021-01-30 LAB — TSH: TSH: 5.48 u[IU]/mL — ABNORMAL HIGH (ref 0.35–4.50)

## 2021-01-30 NOTE — Addendum Note (Signed)
Addended by: Jacobo Forest on: 01/30/2021 07:58 AM   Modules accepted: Orders

## 2021-01-31 ENCOUNTER — Other Ambulatory Visit: Payer: Self-pay | Admitting: Internal Medicine

## 2021-01-31 ENCOUNTER — Telehealth: Payer: Self-pay | Admitting: Internal Medicine

## 2021-01-31 DIAGNOSIS — C61 Malignant neoplasm of prostate: Secondary | ICD-10-CM | POA: Insufficient documentation

## 2021-01-31 DIAGNOSIS — N183 Chronic kidney disease, stage 3 unspecified: Secondary | ICD-10-CM | POA: Insufficient documentation

## 2021-01-31 DIAGNOSIS — R972 Elevated prostate specific antigen [PSA]: Secondary | ICD-10-CM

## 2021-01-31 NOTE — Assessment & Plan Note (Signed)
Urology referral

## 2021-01-31 NOTE — Addendum Note (Signed)
Addended by: Cassandria Anger on: 01/31/2021 08:27 AM   Modules accepted: Orders

## 2021-01-31 NOTE — Assessment & Plan Note (Signed)
New.  Obtain abdominal ultrasound to rule out urinary obstruction

## 2021-01-31 NOTE — Telephone Encounter (Signed)
Called # Back there was no answer per msg on vm Alwyn Ren) Mail box is full not able to leave msg../lmb

## 2021-01-31 NOTE — Telephone Encounter (Signed)
[facility] called and is needing a call back in regards to the ultrasound order that was placed. They can be reached at [phone] ext [phone]

## 2021-02-05 NOTE — Telephone Encounter (Signed)
Harold Waters w/ [facility] called and is requesting a call back in regards to the ultrasound. She can be reached at [phone].

## 2021-02-06 NOTE — Telephone Encounter (Signed)
Called Harold Waters back she states she wanted to verify order. She states MD order abdominal complete and this test will not show anything below the belly button. If pt is having pain below it will not show on this test. Pls advise on what you are looking for, and is this the right test.../lmb

## 2021-02-07 NOTE — Telephone Encounter (Signed)
Notified April w/ MD response../lmb 

## 2021-02-07 NOTE — Telephone Encounter (Signed)
I understand.  No change in the order.  Thank you

## 2021-02-20 ENCOUNTER — Encounter: Payer: Self-pay | Admitting: Internal Medicine

## 2021-03-10 ENCOUNTER — Emergency Department (HOSPITAL_BASED_OUTPATIENT_CLINIC_OR_DEPARTMENT_OTHER)
Admission: EM | Admit: 2021-03-10 | Discharge: 2021-03-10 | Disposition: A | Discharge status: Home / Self Care | Payer: HMO | Attending: Emergency Medicine | Admitting: Emergency Medicine

## 2021-03-10 ENCOUNTER — Other Ambulatory Visit: Payer: Self-pay

## 2021-03-10 ENCOUNTER — Telehealth: Payer: Self-pay | Admitting: Internal Medicine

## 2021-03-10 ENCOUNTER — Encounter (HOSPITAL_BASED_OUTPATIENT_CLINIC_OR_DEPARTMENT_OTHER): Payer: Self-pay | Admitting: Emergency Medicine

## 2021-03-10 ENCOUNTER — Emergency Department (HOSPITAL_BASED_OUTPATIENT_CLINIC_OR_DEPARTMENT_OTHER): Payer: HMO

## 2021-03-10 DIAGNOSIS — I25110 Atherosclerotic heart disease of native coronary artery with unstable angina pectoris: Secondary | ICD-10-CM | POA: Insufficient documentation

## 2021-03-10 DIAGNOSIS — E1169 Type 2 diabetes mellitus with other specified complication: Secondary | ICD-10-CM | POA: Insufficient documentation

## 2021-03-10 DIAGNOSIS — R519 Headache, unspecified: Secondary | ICD-10-CM

## 2021-03-10 DIAGNOSIS — Z87891 Personal history of nicotine dependence: Secondary | ICD-10-CM | POA: Diagnosis not present

## 2021-03-10 DIAGNOSIS — E785 Hyperlipidemia, unspecified: Secondary | ICD-10-CM | POA: Diagnosis not present

## 2021-03-10 DIAGNOSIS — Z955 Presence of coronary angioplasty implant and graft: Secondary | ICD-10-CM | POA: Insufficient documentation

## 2021-03-10 DIAGNOSIS — I10 Essential (primary) hypertension: Secondary | ICD-10-CM | POA: Insufficient documentation

## 2021-03-10 DIAGNOSIS — Z79899 Other long term (current) drug therapy: Secondary | ICD-10-CM | POA: Diagnosis not present

## 2021-03-10 DIAGNOSIS — R972 Elevated prostate specific antigen [PSA]: Secondary | ICD-10-CM | POA: Diagnosis not present

## 2021-03-10 DIAGNOSIS — E1151 Type 2 diabetes mellitus with diabetic peripheral angiopathy without gangrene: Secondary | ICD-10-CM | POA: Insufficient documentation

## 2021-03-10 DIAGNOSIS — Z8603 Personal history of neoplasm of uncertain behavior: Secondary | ICD-10-CM | POA: Insufficient documentation

## 2021-03-10 LAB — CBC WITH DIFFERENTIAL/PLATELET
Abs Immature Granulocytes: 0.01 10*3/uL (ref 0.00–0.07)
Basophils Absolute: 0 10*3/uL (ref 0.0–0.1)
Basophils Relative: 0 %
Eosinophils Absolute: 0.6 10*3/uL — ABNORMAL HIGH (ref 0.0–0.5)
Eosinophils Relative: 9 %
HCT: 40.4 % (ref 39.0–52.0)
Hemoglobin: 13.4 g/dL (ref 13.0–17.0)
Immature Granulocytes: 0 %
Lymphocytes Relative: 9 %
Lymphs Abs: 0.6 10*3/uL — ABNORMAL LOW (ref 0.7–4.0)
MCH: 29.2 pg (ref 26.0–34.0)
MCHC: 33.2 g/dL (ref 30.0–36.0)
MCV: 88 fL (ref 80.0–100.0)
Monocytes Absolute: 0.5 10*3/uL (ref 0.1–1.0)
Monocytes Relative: 7 %
Neutro Abs: 5.4 10*3/uL (ref 1.7–7.7)
Neutrophils Relative %: 75 %
Platelets: 186 10*3/uL (ref 150–400)
RBC: 4.59 MIL/uL (ref 4.22–5.81)
RDW: 13.7 % (ref 11.5–15.5)
WBC: 7.1 10*3/uL (ref 4.0–10.5)
nRBC: 0 % (ref 0.0–0.2)

## 2021-03-10 LAB — BASIC METABOLIC PANEL
Anion gap: 8 (ref 5–15)
BUN: 27 mg/dL — ABNORMAL HIGH (ref 8–23)
CO2: 29 mmol/L (ref 22–32)
Calcium: 9.1 mg/dL (ref 8.9–10.3)
Chloride: 102 mmol/L (ref 98–111)
Creatinine, Ser: 1.57 mg/dL — ABNORMAL HIGH (ref 0.61–1.24)
GFR, Estimated: 46 mL/min — ABNORMAL LOW (ref >60)
Glucose, Bld: 105 mg/dL — ABNORMAL HIGH (ref 70–99)
Potassium: 4 mmol/L (ref 3.5–5.1)
Sodium: 139 mmol/L (ref 135–145)

## 2021-03-10 LAB — SEDIMENTATION RATE: Sed Rate: 32 mm/hr — ABNORMAL HIGH (ref 0–16)

## 2021-03-10 MED ORDER — METOCLOPRAMIDE HCL 5 MG/ML IJ SOLN
10.0000 mg | Freq: Once | INTRAMUSCULAR | Status: AC
  Administered 2021-03-10: 10 mg via INTRAVENOUS
  Filled 2021-03-10: qty 2

## 2021-03-10 NOTE — ED Triage Notes (Signed)
High BP at PCP , sent for further evaluation. Headache.

## 2021-03-10 NOTE — Telephone Encounter (Signed)
    Patients daughter calling to request an appointment for her father for elevated blood pressure. She states patient saw Urology today and was advised to seek care for elevated BP. Daughter was unsure of actual reading, she thinks is was 294/?[name]  Patient was not home to confirm. Daughter to call back in moments after speaking with patient. Advised to speak/ be transferred to [facility] to be triaged.

## 2021-03-10 NOTE — ED Provider Notes (Signed)
[facility] EMERGENCY DEPT Provider Note   CSN: [id number] Arrival date & time: 03/10/21  1700     History Chief Complaint  Patient presents with  . Headache    Harold Waters is a 75 y.o. male.  The history is provided by the patient, a relative and medical records.  Headache  Ulas Zuercher Bajaj is a 75 y.o. male who presents to the Emergency Department complaining of HTN.  He presents to the ED accompanied by his daughter for evaluated for hypertension from urology.  He went to an appointment today and was told his blood pressure was high (290) and was referred to the ED. He went home and took his medication and presented for evaluation.  He reports several months of HA - sometimes whole head and sometimes the left side.  Has associated fatigue.    He sometimes forgets his blood pressure medication over the weekend (did not take this weekend).  He normally takes his meds at lunch and dinner. His only blood pressure medication is clonidine.  No vision changes.  No fever.  No chest pain, sob, abdominal pain.      Past Medical History:  Diagnosis Date  . Angina   . Chronic stomach ulcer    "get them off and on" (09/19/2018)  . Coronary atherosclerosis of unspecified type of vessel, native or graft   . Diverticulosis   . DVT of lower extremity (deep venous thrombosis) ([place]) ~ 2010   LLE  . Erosive gastritis   . Essential hypertension, benign   . GERD (gastroesophageal reflux disease)   . Headache(784.0) 02/18/12   "lately"  . History of kidney stones   . Internal hemorrhoids   . Lower back pain   . Myocardial infarction ([place]) 1997  . Osteoarthritis   . Other and unspecified hyperlipidemia   . PAD (peripheral artery disease) ([facility])    with ABI's 0.8 on the right and 0.86 on the left  . Pneumonia 1957  . PSVT (paroxysmal supraventricular tachycardia) ([place]) 02/18/12  . Shortness of breath    "lying down"  . Sleep apnea    does not wear c-pap; pt does  not recall this hx on 09/19/2018    Patient Active Problem List   Diagnosis Date Noted  . Elevated PSA, between 10 and less than 20 ng/ml 01/31/2021  . CRI (chronic renal insufficiency), stage 3 (moderate) ([place]) 01/31/2021  . LLQ abdominal pain 01/29/2021  . Constipation 01/29/2021  . Distorted vision 08/23/2020  . Urinary frequency 08/23/2020  . Knee pain, bilateral 05/22/2020  . Fatigue 09/05/2019  . Insomnia 07/03/2019  . Memory loss 07/03/2019  . Vitamin D deficiency, unspecified 12/12/2018  . Other hemorrhoids 12/12/2018  . Personal history of other venous thrombosis and embolism 12/12/2018  . Muscle weakness (generalized) 12/12/2018  . Long term (current) use of aspirin 12/12/2018  . Hyperglycemia, unspecified 12/12/2018  . Diverticulosis of intestine, part unspecified, without perforation or abscess without bleeding 12/12/2018  . Difficulty in walking, not elsewhere classified 12/12/2018  . Deficiency of other specified B group vitamins 12/12/2018  . Pre-operative clearance 11/25/2018  . PAD (peripheral artery disease) ([place]) 10/18/2018  . Chest pain 09/20/2018  . Claudication in peripheral vascular disease ([place]) 04/22/2017  . Pain in joint, shoulder region 06/22/2016  . Diabetes mellitus type 2 with complications ([place]) 33/29/5188  . Well adult exam 12/19/2014  . Migraine headache with aura 01/27/2013  . CAD (coronary artery disease) 10/03/2012  . Actinic keratosis 09/06/2012  . Unstable  angina ([place]) 02/18/2012  . PSVT (paroxysmal supraventricular tachycardia) ([place]) 02/18/2012  . Pneumonia, community acquired 10/12/2011  . Atherosclerosis of native artery of extremity with intermittent claudication ([place]) 06/02/2010  . CAROTID ARTERY DISEASE 04/23/2010  . OSTEOARTHRITIS 02/27/2010  . Arthralgia 02/27/2010  . LOW BACK PAIN 02/27/2010  . PHARYNGITIS, ACUTE 06/18/2009  . DYSPNEA 04/03/2009  . ACUTE BRONCHITIS 02/12/2009  . VENOUS INSUFFICIENCY 12/19/2008  . EDEMA  12/19/2008  . GERD 02/20/2008  . Abdominal pain 02/20/2008  . Neoplasm of uncertain behavior of skin 10/18/2007  . COUGH 10/18/2007  . Dyslipidemia 09/07/2007  . Essential hypertension 09/07/2007  . MYOCARDIAL INFARCTION 09/07/2007  . CHEST PAIN, ATYPICAL, HX OF 09/07/2007  . APPENDECTOMY, HX OF 09/07/2007  . PERCUTANEOUS TRANSLUMINAL CORONARY ANGIOPLASTY, HX OF 09/07/2007  . ARTHROSCOPY, KNEE, HX OF 09/07/2007    Past Surgical History:  Procedure Laterality Date  . ARTHROSCOPY KNEE W/ DRILLING Right ~ 2001  . BYPASS GRAFT Left 12/05/2018   femoral popliteal   . CARDIAC CATHETERIZATION  09/19/2018  . COLONOSCOPY    . CORONARY ANGIOPLASTY WITH STENT PLACEMENT  1997   "2"  . FEMORAL-POPLITEAL BYPASS GRAFT Left 12/06/2018   Procedure: BYPASS GRAFT FEMORAL-POPLITEAL ARTERY;  Surgeon: Waynetta Sandy, MD;  Location: [facility];  Service: Vascular;  Laterality: Left;  . ILIAC ARTERY STENT  04/22/2017   . Placement of a 6 mm x 100 mm Viabahn covered stent left SFA  . INTRAVASCULAR PRESSURE WIRE/FFR STUDY  09/19/2018  . INTRAVASCULAR PRESSURE WIRE/FFR STUDY N/A 09/19/2018   Procedure: INTRAVASCULAR PRESSURE WIRE/FFR STUDY;  Surgeon: [name], MD;  Location: [facility];  Service: Cardiovascular;  Laterality: N/A;  . LEFT HEART CATH AND CORONARY ANGIOGRAPHY N/A 09/19/2018   Procedure: LEFT HEART CATH AND CORONARY ANGIOGRAPHY;  Surgeon: [name], MD;  Location: [facility];  Service: Cardiovascular;  Laterality: N/A;  . LEFT HEART CATHETERIZATION WITH CORONARY ANGIOGRAM N/A 02/19/2012   Procedure: LEFT HEART CATHETERIZATION WITH CORONARY ANGIOGRAM;  Surgeon: Larey Dresser, MD;  Location: [facility] CATH LAB;  Service: Cardiovascular;  Laterality: N/A;  . LOWER EXTREMITY ANGIOGRAPHY Bilateral 04/22/2017   Procedure: Lower Extremity Angiography;  Surgeon: [name], MD;  Location: [facility];  Service: Cardiovascular;  Laterality: Bilateral;  . LOWER  EXTREMITY ANGIOGRAPHY Bilateral 09/19/2018  . LOWER EXTREMITY ANGIOGRAPHY Bilateral 09/19/2018   Procedure: LOWER EXTREMITY ANGIOGRAPHY;  Surgeon: [name], MD;  Location: [facility];  Service: Cardiovascular;  Laterality: Bilateral;  . PERIPHERAL ARTERIAL STENT GRAFT  09/2010   LLE  . PERIPHERAL VASCULAR ATHERECTOMY Left 04/22/2017   Procedure: Peripheral Vascular Atherectomy;  Surgeon: [name], MD;  Location: [facility];  Service: Cardiovascular;  Laterality: Left;  SFA  . PERIPHERAL VASCULAR INTERVENTION Left 04/22/2017   Procedure: Peripheral Vascular Intervention;  Surgeon: [name], MD;  Location: [facility];  Service: Cardiovascular;  Laterality: Left;  SFA  . UPPER GASTROINTESTINAL ENDOSCOPY         Family History  Problem Relation Age of Onset  . Ulcers Father        had stomach issues, not sure what happened  . Coronary artery disease Brother        male 1st degree relative <50  . Colon cancer Neg Hx   . Esophageal cancer Neg Hx   . Rectal cancer Neg Hx   . Stomach cancer Neg Hx     Social History   Tobacco  Use  . Smoking status: Former Smoker    Packs/day: 1.00    Years: 40.00    Pack years: 40.00    Types: Cigarettes    Quit date: 04/03/2010    Years since quitting: 10.9  . Smokeless tobacco: Never Used  Vaping Use  . Vaping Use: Never used  Substance Use Topics  . Alcohol use: Not Currently    Comment: "used to drink when I was young; quit ~ 1980's"  . Drug use: Never    Home Medications Prior to Admission medications   Medication Sig Start Date End Date Taking? Authorizing Provider  aspirin EC 81 MG tablet Take 81 mg by mouth daily.    [provider]  Blood Glucose Monitoring Suppl (ONETOUCH VERIO) w/Device KIT 1 Units by Does not apply route daily as needed. 12/29/18   Plotnikov, [name], MD  Cholecalciferol (VITAMIN D3) 50 MCG (2000 UT) capsule Take 1 capsule (2,000 Units total) by mouth  daily. 07/03/19   Plotnikov, [name], MD  cloNIDine (CATAPRES) 0.1 MG tablet Take 2-3 times a day 01/29/21   Plotnikov, [name], MD  glucose blood (ONETOUCH VERIO) test strip Use to check blood sugar daily 12/30/18   Plotnikov, [name], MD  memantine (NAMENDA) 5 MG tablet Take 1 tablet (5 mg total) by mouth 2 (two) times daily. 10/29/20   Cameron Sprang, MD  mirtazapine (REMERON) 15 MG tablet Take 1 tablet (15 mg total) by mouth at bedtime. 10/29/20   Cameron Sprang, MD  Multiple Vitamins-Minerals (MENS MULTIVITAMIN PLUS) TABS Take 1 tablet by mouth at bedtime.     [provider]  nitroGLYCERIN (NITROSTAT) 0.4 MG SL tablet Place 1 tablet under tongue every 5 mins. DO NOT USE MORE THAN 3 TABS 06/17/20   Plotnikov, [name], MD  polyethylene glycol powder (GLYCOLAX/MIRALAX) 17 GM/SCOOP powder Take 17 g by mouth 2 (two) times daily as needed for moderate constipation. 01/29/21   Plotnikov, [name], MD  rosuvastatin (CRESTOR) 40 MG tablet TAKE 1 TABLET BY MOUTH EVERY DAY 08/01/20   Deberah Pelton, NP    Allergies    Eggs or egg-derived products, Aricept [donepezil], and Coreg [carvedilol]  Review of Systems   Review of Systems  Neurological: Positive for headaches.  All other systems reviewed and are negative.   Physical Exam Updated Vital Signs BP (!) 188/95 (BP Location: Right Arm)   Pulse 77   Temp 98.3 F (36.8 C) (Oral)   Resp 20   Ht _0  (1.651 m)   Wt 68 kg   SpO2 97%   BMI 24.96 kg/m   Physical Exam Vitals and nursing note reviewed.  Constitutional:      Appearance: He is well-developed.  HENT:     Head: Normocephalic and atraumatic.     Comments: TTP over forehead Eyes:     Extraocular Movements: Extraocular movements intact.     Pupils: Pupils are equal, round, and reactive to light.  Cardiovascular:     Rate and Rhythm: Normal rate and regular rhythm.     Heart sounds: No murmur heard.   Pulmonary:     Effort: Pulmonary effort is normal. No  respiratory distress.     Breath sounds: Normal breath sounds.  Abdominal:     Palpations: Abdomen is soft.     Tenderness: There is no abdominal tenderness. There is no guarding or rebound.  Musculoskeletal:        General: No tenderness.  Comments: Trace edema to lle  Skin:    General: Skin is warm and dry.  Neurological:     Mental Status: He is alert and oriented to person, place, and time.     Comments: 5/5 strength in all four extremities with sensation to light touch intact in all four extremities.    Psychiatric:        Behavior: Behavior normal.     ED Results / Procedures / Treatments   Labs (all labs ordered are listed, but only abnormal results are displayed) Labs Reviewed  BASIC METABOLIC PANEL - Abnormal; Notable for the following components:      Result Value   Glucose, Bld 105 (*)    BUN 27 (*)    Creatinine, Ser 1.57 (*)    GFR, Estimated 46 (*)    All other components within normal limits  CBC WITH DIFFERENTIAL/PLATELET - Abnormal; Notable for the following components:   Lymphs Abs 0.6 (*)    Eosinophils Absolute 0.6 (*)    All other components within normal limits  SEDIMENTATION RATE - Abnormal; Notable for the following components:   Sed Rate 32 (*)    All other components within normal limits  C-REACTIVE PROTEIN    EKG EKG Interpretation  Date/Time:  Monday Mar 10 2021 18:53:24 EDT Ventricular Rate:  71 PR Interval:  149 QRS Duration: 105 QT Interval:  441 QTC Calculation: 480 R Axis:   43 Text Interpretation: Sinus rhythm Probable left atrial enlargement RSR' in V1 or V2, right VCD or RVH Minimal ST elevation, anterior leads Borderline prolonged QT interval Confirmed by Quintella Reichert [phone]) on 03/10/2021 6:58:18 PM   Radiology CT Head Wo Contrast  Result Date: 03/10/2021 CLINICAL DATA:  Elevated blood pressure and headache. EXAM: CT HEAD WITHOUT CONTRAST TECHNIQUE: Contiguous axial images were obtained from the base of the skull through  the vertex without intravenous contrast. COMPARISON:  July 27, 2019 FINDINGS: Brain: There is mild cerebral atrophy with widening of the extra-axial spaces and ventricular dilatation. There are areas of decreased attenuation within the white matter tracts of the supratentorial brain, consistent with microvascular disease changes. Vascular: No hyperdense vessel or unexpected calcification. Skull: Normal. Negative for fracture or focal lesion. Sinuses/Orbits: No acute finding. Other: None. IMPRESSION: 1. Generalized cerebral atrophy. 2. No acute intracranial abnormality. Electronically Signed   By: Virgina Norfolk M.D.   On: 03/10/2021 18:39    Procedures Procedures   Medications Ordered in ED Medications  metoCLOPramide (REGLAN) injection 10 mg (10 mg Intravenous Given 03/10/21 2012)    ED Course  I have reviewed the triage vital signs and the nursing notes.  Pertinent labs & imaging results that were available during my care of the patient were reviewed by me and considered in my medical decision making (see chart for details).    MDM Rules/Calculators/A&P                         patient with history of hypertension referred to the emergency department for elevated blood pressure in setting of missed home blood pressure medications. He is on clonidine and had not been taking it for the last two days but did take it just prior to ED presentation. He also reports several months of headaches that are waxing and waning. He has mild facial tenderness to palpation with no focal neurologic deficits. Imaging is negative for acute abnormality. Sed rate is only minimally elevated, clinical picture is not consistent with temporal  arteritis. BMP with renal function that is at his baseline. Presentation is not consistent with hypertensive urgency. Patient headache is improved on assessment in the department. Discussed with patient home care for hypertension as well as headache. Discussed importance of  taking his medications regularly and as prescribed. Also recommend PCP follow-up for potential additions to his blood pressure medication. Return precautions discussed.  Final Clinical Impression(s) / ED Diagnoses Final diagnoses:  Essential hypertension  Bad headache    Rx / DC Orders ED Discharge Orders    None       Quintella Reichert, MD 03/10/21 2323

## 2021-03-11 ENCOUNTER — Encounter: Payer: Self-pay | Admitting: Internal Medicine

## 2021-03-11 ENCOUNTER — Telehealth (INDEPENDENT_AMBULATORY_CARE_PROVIDER_SITE_OTHER): Payer: HMO | Admitting: Internal Medicine

## 2021-03-11 DIAGNOSIS — I25119 Atherosclerotic heart disease of native coronary artery with unspecified angina pectoris: Secondary | ICD-10-CM

## 2021-03-11 DIAGNOSIS — U071 COVID-19: Secondary | ICD-10-CM | POA: Insufficient documentation

## 2021-03-11 DIAGNOSIS — I10 Essential (primary) hypertension: Secondary | ICD-10-CM

## 2021-03-11 LAB — C-REACTIVE PROTEIN: CRP: 0.7 mg/dL (ref <1.0)

## 2021-03-11 MED ORDER — PAXLOVID 20 X 150 MG & 10 X 100MG PO TBPK: 3.0000 | ORAL_TABLET | Freq: Two times a day (BID) | ORAL | 0 refills | Status: DC

## 2021-03-11 NOTE — Telephone Encounter (Signed)
Noted.  Okay to make an appointment.  If the systolic blood pressures is greater than 220, go to ER Thanks

## 2021-03-11 NOTE — Assessment & Plan Note (Signed)
New Paxlovid Rx OTC meds

## 2021-03-11 NOTE — Progress Notes (Signed)
Virtual Visit via Video Note  I connected with [place] on 03/11/21 at  3:40 PM EDT by a video enabled telemedicine application and verified that I am speaking with the correct person using two identifiers.   I discussed the limitations of evaluation and management by telemedicine and the availability of in person appointments. The patient expressed understanding and agreed to proceed.  I was located at our [facility] office. The patient was at home. Dtr [name] was present in the visit.   History of Present Illness: C/o COVID (+). Wife is (+) too  C/o runny nose, cough, fever. No shortness of breath, abdominal pain, diarrhea, constipation. C/o arthralgias, no skin rashes.   Observations/Objective: The patient appears to be in no acute distress, looks ok  Assessment and Plan:  See my Assessment and Plan. Follow Up Instructions:    I discussed the assessment and treatment plan with the patient. The patient was provided an opportunity to ask questions and all were answered. The patient agreed with the plan and demonstrated an understanding of the instructions.   The patient was advised to call back or seek an in-person evaluation if the symptoms worsen or if the condition fails to improve as anticipated.  I provided face-to-face time during this encounter. We were at different locations.   Walker Kehr, MD

## 2021-03-11 NOTE — Assessment & Plan Note (Signed)
Worse. ER notes reviewed. He did not take his meds. SBP was 290 yesterday. Risks associated with treatment noncompliance were discussed. Compliance was encouraged. Clonidine prn

## 2021-03-11 NOTE — Assessment & Plan Note (Signed)
No CP NTG prn SL

## 2021-03-12 ENCOUNTER — Other Ambulatory Visit: Payer: Self-pay | Admitting: Internal Medicine

## 2021-03-12 MED ORDER — ASPIRIN EC 81 MG PO TBEC: 81.0000 mg | DELAYED_RELEASE_TABLET | Freq: Every day | ORAL | 3 refills | Status: DC

## 2021-03-14 ENCOUNTER — Telehealth: Payer: Self-pay | Admitting: Internal Medicine

## 2021-03-14 NOTE — Telephone Encounter (Signed)
Harold Waters, This is the first time I get this request.  Just tell them to do a complete abdominal ultrasound.  I assume it includes left kidney and the spleen. Thanks,

## 2021-03-14 NOTE — Telephone Encounter (Signed)
Ok.. what are you actually looking for? And what order would you like for me to place.Marland KitchenJohny Waters

## 2021-03-14 NOTE — Telephone Encounter (Signed)
Okay.  Please add the order.  Thank you

## 2021-03-14 NOTE — Telephone Encounter (Signed)
    [facility] calling to report LLQ view is not included in US Abdomen Complete   A separate order would be needed depending on what Dr Alain Marion is looking for.  Please call [phone], option 1 to change order

## 2021-04-23 ENCOUNTER — Ambulatory Visit
Admission: RE | Admit: 2021-04-23 | Discharge: 2021-04-23 | Disposition: A | Discharge status: Home / Self Care | Payer: HMO | Source: Ambulatory Visit | Attending: Internal Medicine | Admitting: Internal Medicine

## 2021-04-23 DIAGNOSIS — K760 Fatty (change of) liver, not elsewhere classified: Secondary | ICD-10-CM | POA: Diagnosis not present

## 2021-04-23 DIAGNOSIS — N261 Atrophy of kidney (terminal): Secondary | ICD-10-CM | POA: Diagnosis not present

## 2021-04-26 ENCOUNTER — Other Ambulatory Visit: Payer: Self-pay | Admitting: General Practice

## 2021-04-28 NOTE — Telephone Encounter (Signed)
Attempted to reach patient but he did not answer and unable to leave voicemail box is full.

## 2021-05-01 ENCOUNTER — Encounter: Payer: Self-pay | Admitting: Neurology

## 2021-05-01 ENCOUNTER — Other Ambulatory Visit: Payer: Self-pay

## 2021-05-01 ENCOUNTER — Ambulatory Visit: Payer: HMO | Admitting: Neurology

## 2021-05-01 VITALS — BP 196/88 | HR 65 | Ht 65.0 in | Wt 157.6 lb

## 2021-05-01 DIAGNOSIS — F0150 Vascular dementia without behavioral disturbance: Secondary | ICD-10-CM | POA: Diagnosis not present

## 2021-05-01 DIAGNOSIS — R400 Somnolence: Secondary | ICD-10-CM | POA: Diagnosis not present

## 2021-05-01 MED ORDER — MIRTAZAPINE 15 MG PO TABS: ORAL_TABLET | ORAL | 11 refills | Status: DC

## 2021-05-01 NOTE — Progress Notes (Signed)
Assessment/Plan:    Vascular dementia without behavioral disturbance  Is a pleasant 75 year old right-handed man with a diagnosis of vascular dementia without behavioral disturbance.  Harold Waters has had difficulties with medications, his daughter will now monitor medications. Harold Waters reports poor sleep, there might be a component of sleep apnea.MMSE today is 30/30 (MMSE 26/30 in 03/2020)   Schedule home sleep study to rule out sleep apnea Restart mirtazapine 33m to 1/2 tablet every night Contact our office for an update in 2 weeks, if no problems with mirtazapine, we will plan to restart the memory medication Memantine once a day Follow-up in 6 months, call for any changes.  Discussed safety both in and out of the home.  Discussed the importance of regular daily schedule with inclusion of crossword puzzles to maintain brain function.  Continue to monitor mood. Continue to monitor driving Agree with daughter taking over medication administration Stay active at least 30 minutes at least 3 times a week.  Naps should be scheduled and should be no longer than 60 minutes and should not occur after 2 PM.   Case discussed with Dr. ADelice Waters agrees with the plan    Subjective:   Harold Waters a 75y.o. male with a history of hypertension, hyperlipidemia, PAD, history of migraine with aura, recent COVID on 03/11/2021, with worsening memory seen today in follow-up.  This patient is accompanied in the office by his daughter who supplements the history.  Previous records as well as any outside records available were reviewed prior to todays visit.   Harold Waters reports his memory being a little worse, especially over the last 2 months.  Several events happened over the last few weeks, including COVID positive in May, having discontinued memantine "because it was making me sleepy ", and intermittent insomnia, likely sleep apnea.  Harold Waters has not been taking Remeron, because Harold Waters does not like to take a lot of  medications.  Daughter adds that Harold Waters may be missing medications.  Daughter adds when Harold Waters is asked a question, Harold Waters takes a longer time to think about it, or is for example Harold Waters is able to eat a burger, Harold Waters has difficulties with "what to do with the bread "  Daughter dates that Harold Waters may be showing more sadness, and at times irritability.  She states that during the day, Harold Waters is very functional, able to work (She works with him, running a cArboriculturist.  However when Harold Waters makes it home, Harold Waters has frequent  "bickering" with his wife.  Harold Waters is still doing well with finances, and she witnesses it as she works with him.  Denies getting lost, or leaving objects in unusual places.  Appetite is good, denying trouble swallowing.  Harold Waters does not cook, and denies leaving the stove on use at all.  Harold Waters ambulates without difficulty, without the use of a walker or a cane.  Harold Waters continues to drive, without getting lost. Denies headaches, trauma, or injuries to the head, double vision, dizziness, focal numbness or tingling, unilateral weakness or tremors. Denies urine incontinence or retention. Denies constipation or diarrhea.        History on Initial Assessment 08/23/2019: This is a pleasant 75year old right-handed man with a history of hypertension, hyperlipidemia, PAD, presenting for evaluation of memory loss. Harold Waters feels that memory changes started soon after his surgery in February 2020 where Harold Waters had a left femoral to AK popliteal artery bypass under general anesthesia.  Prior to this, Harold Waters had minor memory changes, but  soon after surgery Harold Waters noticed that Harold Waters was unable to do things that Harold Waters could do easily in the past such as math. Harold Waters used to do large numbers in the back of his head, but has more difficulty now. Harold Waters lives with his wife who constantly says something about his memory saying "you forget everything," which upsets him since this was not happening prior to the surgery. Harold Waters denies getting lost driving but has missed his exit a few times  because Harold Waters is not focusing or forgets where Harold Waters is going. Harold Waters has forgotten bill payments and has had late charges (new as well). Harold Waters has been forgetting his medications, his wife now helps him. Harold Waters loses things constantly, his phone is lost all the time. Harold Waters has put things for the pantry in the freezer. Harold Waters denies leaving the stove on. Harold Waters has word-finding difficulties and forgets names more. 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 Harold Waters has also had a change in sleep pattern, Harold Waters has always had sleep difficulties getting an average of 4 hours of sleep, but since the surgery Harold Waters would only get 2-3 hours of sleep. With Trazodone 1 tab qhs Harold Waters gets 4 hours. Harold Waters has daytime drowsiness and would drag his feet because Harold Waters is so tired. Mood is good. No family history of dementia, no history of significant head injuries or alcohol use.   Harold Waters gets dizzy when walking or changing clothes, no falls. Harold Waters recalls an episode of double vision 3-4 years ago, this has resolved. Harold Waters has occasional difficulty swallowing water. Harold Waters has some numbness in both hands. Harold Waters denies any headaches, focal numbness/tingling/weakness, bowel/bladder dysfunction, anosmia, or tremors. Harold Waters denies any prior history of stroke, head CT done in 07/2019 showed an old left posterior parietal infarct and old left superior cerebellar lacunar infarcts, mild chronic microvascular disease, no acute change.     MRI brain showed mild diffuse atrophy and moderate chronic microvascular disease, chronic small strokes in the left occipital lobe, bilateral cerebellar hemispheres and thalami.      Laboratory Data:      Lab Results  Component Value Date    TSH 4.91 (H) 07/03/2019    Free T4 0.77  PREVIOUS MEDICATIONS: Aricept 10 mg daily  CURRENT MEDICATIONS:  Current Outpatient Medications:    aspirin EC 81 MG tablet, Take 1 tablet (81 mg total)  by mouth daily., Disp: 100 tablet, Rfl: 3   Blood Glucose Monitoring Suppl (ONETOUCH VERIO) w/Device KIT, 1 Units by Does not apply route daily as needed., Disp: 1 kit, Rfl: 1   Cholecalciferol (VITAMIN D3) 50 MCG (2000 UT) capsule, Take 1 capsule (2,000 Units total) by mouth daily., Disp: 100 capsule, Rfl: 3   glucose blood (ONETOUCH VERIO) test strip, Use to check blood sugar daily, Disp: 50 each, Rfl: 11   Multiple Vitamins-Minerals (MENS MULTIVITAMIN PLUS) TABS, Take 1 tablet by mouth at bedtime. , Disp: , Rfl:    polyethylene glycol powder (GLYCOLAX/MIRALAX) 17 GM/SCOOP powder, Take 17 g by mouth 2 (two) times daily as needed for moderate constipation., Disp: 500 g, Rfl: 3   rosuvastatin (CRESTOR) 40 MG tablet, TAKE 1 TABLET BY MOUTH EVERY DAY, Disp: 90 tablet, Rfl: 1   mirtazapine (REMERON) 15 MG tablet, Take 1/2 tablet every night, Disp: 15 tablet, Rfl: 11   nitroGLYCERIN (NITROSTAT) 0.4 MG SL tablet, Place 1 tablet  under tongue every 5 mins. DO NOT USE MORE THAN 3 TABS (Patient not taking: Reported on 05/01/2021), Disp: 25 tablet, Rfl: 5     Objective:     PHYSICAL EXAMINATION:    VITALS:   Vitals:   05/01/21 1128  BP: (!) 196/88  Pulse: 65  SpO2: 97%  Weight: 157 lb 9.6 oz (71.5 kg)  Height: _0  (1.651 m)    GEN:  The patient appears stated age and is in NAD. HEENT:  Normocephalic, atraumatic.   Neurological examination:  General: NAD, well-groomed, appears stated age. Orientation: The patient is alert. Oriented to person, place and date  Cranial nerves: There is good facial symmetry.The speech is fluent and clear . No aphasia or dysarthria. Fund of knowledge is appropriate. Recent and remote memory are intact. Attention and concentration are intact.  Able to name objects and repeat phrases.  Hearing is intact to conversational tone.    Sensation: Sensation is intact to light touch throughout Motor: Strength is at least antigravity x4.  No flowsheet data found.    MMSE - Mini Mental State Exam 05/01/2021 03/29/2020  Orientation to time 5 5  Orientation to Place 5 5  Registration 3 3  Attention/ Calculation 5 4  Recall 3 0  Language- name 2 objects 2 2  Language- repeat 1 1  Language- follow 3 step command 3 3  Language- read & follow direction 1 1  Write a sentence 1 1  Copy design 1 1  Total score 30 26      Movement examination: Tone: There is normal tone in the UE/LE Abnormal movements:  no tremor.  No myoclonus.  No asterixis.   Coordination:  There is no decremation with RAM's. Normal finger to nose  Gait and Station: The patient has no difficulty arising out of a deep-seated chair without the use of the hands. The patient's stride length is good.  Gait is cautious and narrow.    CBC CBC Latest Ref Rng & Units 03/10/2021 07/03/2019 12/29/2018  WBC 4.0 - 10.5 K/uL 7.1 6.2 4.7  Hemoglobin 13.0 - 17.0 g/dL 13.4 13.8 11.4(L)  Hematocrit 39.0 - 52.0 % 40.4 41.8 33.5(L)  Platelets 150 - 400 K/uL 186 239.0 381.0     CMP Latest Ref Rng & Units 03/10/2021 01/30/2021 01/30/2021  Glucose 70 - 99 mg/dL 105(H) - 137(H)  BUN 8 - 23 mg/dL 27(H) - 25(H)  Creatinine 0.61 - 1.24 mg/dL 1.57(H) - 1.58(H)  Sodium 135 - 145 mmol/L 139 - 140  Potassium 3.5 - 5.1 mmol/L 4.0 - 4.1  Chloride 98 - 111 mmol/L 102 - 102  CO2 22 - 32 mmol/L 29 - 31  Calcium 8.9 - 10.3 mg/dL 9.1 - 9.4  Total Protein 6.0 - 8.3 g/dL - 6.3 6.3  Total Bilirubin 0.2 - 1.2 mg/dL - 0.5 0.5  Alkaline Phos 39 - 117 U/L - 74 74  AST 0 - 37 U/L - 12 12  ALT 0 - 53 U/L - 13 13       Total time spent on today's visit was 44mnutes, including both face-to-face time and nonface-to-face time. Time included that spent on review of records (prior notes available to me/labs/imaging if pertinent), discussing treatment and goals, answering patient's questions and coordinating care.  Cc:  Plotnikov, AEvie Lacks MD SSharene Butters PA-C

## 2021-05-01 NOTE — Progress Notes (Signed)
Patient was seen, evaluated, and treatment plan was discussed with the Advanced Practice Provider. Patient seen with his daughter Harold Waters. Discussed vascular cognitive impairment, discussed prior brain MRI results. He reports that he had stopped both the Memantine and Mirtazapine. He felt Memantine was interrupting his sleep. He felt the mirtazapine helped him sleep through but would still feel drowsy the next day. Off medications he continues to report daytime drowsiness, Harold Waters reports snoring and concern for apneic spells in sleep. Home sleep study will be ordered to assess for sleep apnea. He would like to restart mirtazapine but at 1/2 tab qhs. They will update [place] in 2 weeks, and if doing good on mirtazapine, we will then plan to restart Memantine 10mg  once a day and assess for side effects. Harold Waters will now start managing his medications. She reports he continues to want to stay active and continues to work. Continue control of vascular risk factors, physical and brain stimulation exercises, MIND diet for overall brain health. I have also reviewed the orders written for this patient which were under my direction. I agree with the findings and the plan of care as documented by the Advanced Practice Provider.

## 2021-05-01 NOTE — Patient Instructions (Addendum)
Schedule home sleep study  2. Take mirtazapine 15mg  1/2 tablet every night  3. Contact our office for an update in 2 weeks, if no problems with mirtazapine, we will plan to restart the memory medication Memantine once a day  4. Follow-up in 6 months, call for any changes.    FALL PRECAUTIONS: Be cautious when walking. Scan the area for obstacles that may increase the risk of trips and falls. When getting up in the mornings, sit up at the edge of the bed for a few minutes before getting out of bed. Consider elevating the bed at the head end to avoid drop of blood pressure when getting up. Walk always in a well-lit room (use night lights in the walls). Avoid area rugs or power cords from appliances in the middle of the walkways. Use a walker or a cane if necessary and consider physical therapy for balance exercise. Get your eyesight checked regularly.  FINANCIAL OVERSIGHT: Supervision, especially oversight when making financial decisions or transactions is also recommended.  HOME SAFETY: Consider the safety of the kitchen when operating appliances like stoves, microwave oven, and blender. Consider having supervision and share cooking responsibilities until no longer able to participate in those. Accidents with firearms and other hazards in the house should be identified and addressed as well.  DRIVING: Regarding driving, in patients with progressive memory problems, driving will be impaired. We advise to have someone else do the driving if trouble finding directions or if minor accidents are reported. Independent driving assessment is available to determine safety of driving.  ABILITY TO BE LEFT ALONE: If patient is unable to contact 911 operator, consider using LifeLine, or when the need is there, arrange for someone to stay with patients. Smoking is a fire hazard, consider supervision or cessation. Risk of wandering should be assessed by caregiver and if detected at any point, supervision and safe  proof recommendations should be instituted.  MEDICATION SUPERVISION: Inability to self-administer medication needs to be constantly addressed. Implement a mechanism to ensure safe administration of the medications.  RECOMMENDATIONS FOR ALL PATIENTS WITH MEMORY PROBLEMS: 1. Continue to exercise (Recommend 30 minutes of walking everyday, or 3 hours every week) 2. Increase social interactions - continue going to [place] and enjoy social gatherings with friends and family 3. Eat healthy, avoid fried foods and eat more fruits and vegetables 4. Maintain adequate blood pressure, blood sugar, and blood cholesterol level. Reducing the risk of stroke and cardiovascular disease also helps promoting better memory. 5. Avoid stressful situations. Live a simple life and avoid aggravations. Organize your time and prepare for the next day in anticipation. 6. Sleep well, avoid any interruptions of sleep and avoid any distractions in the bedroom that may interfere with adequate sleep quality 7. Avoid sugar, avoid sweets as there is a strong link between excessive sugar intake, diabetes, and cognitive impairment The Mediterranean diet has been shown to help patients reduce the risk of progressive memory disorders and reduces cardiovascular risk. This includes eating fish, eat fruits and green leafy vegetables, nuts like almonds and hazelnuts, walnuts, and also use olive oil. Avoid fast foods and fried foods as much as possible. Avoid sweets and sugar as sugar use has been linked to worsening of memory function.  There is always a concern of gradual progression of memory problems. If this is the case, then we may need to adjust level of care according to patient needs. Support, both to the patient and caregiver, should then be put into place.

## 2021-05-11 ENCOUNTER — Encounter: Payer: Self-pay | Admitting: Neurology

## 2021-05-15 ENCOUNTER — Other Ambulatory Visit: Payer: Self-pay | Admitting: Neurology

## 2021-05-16 NOTE — Telephone Encounter (Signed)
Ok to send for 90 days, thanks

## 2021-06-16 ENCOUNTER — Other Ambulatory Visit: Payer: Self-pay | Admitting: Neurology

## 2021-06-16 MED ORDER — MEMANTINE HCL 10 MG PO TABS: 10.0000 mg | ORAL_TABLET | Freq: Every day | ORAL | 11 refills | Status: DC

## 2021-06-26 ENCOUNTER — Other Ambulatory Visit: Payer: Self-pay

## 2021-06-26 ENCOUNTER — Ambulatory Visit (INDEPENDENT_AMBULATORY_CARE_PROVIDER_SITE_OTHER): Payer: HMO | Admitting: Internal Medicine

## 2021-06-26 ENCOUNTER — Encounter: Payer: Self-pay | Admitting: Internal Medicine

## 2021-06-26 VITALS — BP 142/82 | HR 56 | Temp 97.8°F | Resp 18 | Ht 65.0 in | Wt 153.0 lb

## 2021-06-26 DIAGNOSIS — K5901 Slow transit constipation: Secondary | ICD-10-CM

## 2021-06-26 DIAGNOSIS — E118 Type 2 diabetes mellitus with unspecified complications: Secondary | ICD-10-CM | POA: Diagnosis not present

## 2021-06-26 DIAGNOSIS — R972 Elevated prostate specific antigen [PSA]: Secondary | ICD-10-CM

## 2021-06-26 DIAGNOSIS — I10 Essential (primary) hypertension: Secondary | ICD-10-CM | POA: Diagnosis not present

## 2021-06-26 DIAGNOSIS — R634 Abnormal weight loss: Secondary | ICD-10-CM | POA: Diagnosis not present

## 2021-06-26 LAB — COMPREHENSIVE METABOLIC PANEL
ALT: 16 U/L (ref 0–53)
AST: 15 U/L (ref 0–37)
Albumin: 4.1 g/dL (ref 3.5–5.2)
Alkaline Phosphatase: 69 U/L (ref 39–117)
BUN: 23 mg/dL (ref 6–23)
CO2: 32 mEq/L (ref 19–32)
Calcium: 9.5 mg/dL (ref 8.4–10.5)
Chloride: 102 mEq/L (ref 96–112)
Creatinine, Ser: 1.78 mg/dL — ABNORMAL HIGH (ref 0.40–1.50)
GFR: 36.84 mL/min — ABNORMAL LOW (ref >60.00)
Glucose, Bld: 112 mg/dL — ABNORMAL HIGH (ref 70–99)
Potassium: 4.5 mEq/L (ref 3.5–5.1)
Sodium: 141 mEq/L (ref 135–145)
Total Bilirubin: 0.7 mg/dL (ref 0.2–1.2)
Total Protein: 6.6 g/dL (ref 6.0–8.3)

## 2021-06-26 LAB — CBC
HCT: 40.7 % (ref 39.0–52.0)
Hemoglobin: 13.4 g/dL (ref 13.0–17.0)
MCHC: 32.9 g/dL (ref 30.0–36.0)
MCV: 86 fl (ref 78.0–100.0)
Platelets: 217 10*3/uL (ref 150.0–400.0)
RBC: 4.73 Mil/uL (ref 4.22–5.81)
RDW: 14.9 % (ref 11.5–15.5)
WBC: 6.6 10*3/uL (ref 4.0–10.5)

## 2021-06-26 LAB — HEMOGLOBIN A1C: Hgb A1c MFr Bld: 7.3 % — ABNORMAL HIGH (ref 4.6–6.5)

## 2021-06-26 LAB — PSA: PSA: 9.9 ng/mL — ABNORMAL HIGH (ref 0.10–4.00)

## 2021-06-26 MED ORDER — AMLODIPINE BESYLATE 10 MG PO TABS: 10.0000 mg | ORAL_TABLET | Freq: Every day | ORAL | 1 refills | Status: DC

## 2021-06-26 NOTE — Assessment & Plan Note (Signed)
Checking PSA today for trend. He does have concerning weight loss 20 pounds in the last few months.

## 2021-06-26 NOTE — Assessment & Plan Note (Signed)
Checking CBC, CMP, PSA (this was newly elevated earlier this year to 10). Depending on results may need colonoscopy for evaluation as last was 2009 with several polyps removed. He is down about 20 pounds in the last few months.

## 2021-06-26 NOTE — Assessment & Plan Note (Signed)
Since we are doing labs checking HgA1c to assess control as uncontrolled diabetes can cause weight loss. He is diet controlled currently. On statin not on ACE-I/ARB.

## 2021-06-26 NOTE — Progress Notes (Signed)
   Subjective:   Patient ID: Harold Waters, male    DOB: 04-Feb-1946, 75 y.o.   MRN: [id number]  HPI The patient is a 75 YO man coming in for several concerns. Daughter present and helps to provide history. Concerns about blood pressure, weight loss, headache. Poor appetite and down 20 pounds in the last 4-5 months. Constipation new in the last year. Last colonoscopy 2009 with several polyps removed. Noted new elevated PSA around 10 down earlier this year.  PMH, [facility], social history reviewed and updated  Review of Systems  Constitutional:  Positive for appetite change and unexpected weight change.  HENT: Negative.    Eyes: Negative.   Respiratory:  Negative for cough, chest tightness and shortness of breath.   Cardiovascular:  Negative for chest pain, palpitations and leg swelling.  Gastrointestinal:  Positive for constipation. Negative for abdominal distention, abdominal pain, diarrhea, nausea and vomiting.  Musculoskeletal: Negative.   Skin: Negative.   Neurological:  Positive for headaches.  Psychiatric/Behavioral: Negative.     Objective:  Physical Exam Constitutional:      Appearance: He is well-developed.  HENT:     Head: Normocephalic and atraumatic.  Cardiovascular:     Rate and Rhythm: Normal rate and regular rhythm.  Pulmonary:     Effort: Pulmonary effort is normal. No respiratory distress.     Breath sounds: Normal breath sounds. No wheezing or rales.  Abdominal:     General: Bowel sounds are normal. There is no distension.     Palpations: Abdomen is soft.     Tenderness: There is no abdominal tenderness. There is no rebound.  Musculoskeletal:     Cervical back: Normal range of motion.  Skin:    General: Skin is warm and dry.  Neurological:     Mental Status: He is alert and oriented to person, place, and time.     Coordination: Coordination normal.    Vitals:   06/26/21 0932  BP: (!) 142/82  Pulse: (!) 56  Resp: 18  Temp: 97.8 F (36.6 C)  TempSrc:  Oral  SpO2: 97%  Weight: 153 lb (69.4 kg)  Height: '5\' 5"'$  (1.651 m)    This visit occurred during the SARS-CoV-2 public health emergency.  Safety protocols were in place, including screening questions prior to the visit, additional usage of staff PPE, and extensive cleaning of exam room while observing appropriate contact time as indicated for disinfecting solutions.   Assessment & Plan:

## 2021-06-26 NOTE — Assessment & Plan Note (Signed)
Daughter and patient report new constipation in the last year. He has had last colonoscopy 2009 with polyps removed per our records. Checking CBC for iron deficiency or change in Hg and may be wise to repeat colonoscopy.

## 2021-06-26 NOTE — Patient Instructions (Signed)
We will have you stop taking clonidine.   We have sent in amlodipine to start taking 1 pill daily.   We will check the labs today and call you back about the results.

## 2021-06-27 NOTE — Assessment & Plan Note (Addendum)
Due to poor compliance due to memory concerns clonidine is not an ideal agent for him. Rx amlodipine 10 mg daily and follow up PCP 2-3 weeks for BP check. I suspect his severely elevated readings were due to clonidine withdrawal effect. If amlodipine is not effective we did discuss clonidine patch which will provide more consistent medication if clonidine is needed. Checking CMP for end organ damage from severely high readings at home.

## 2021-06-30 ENCOUNTER — Telehealth: Payer: Self-pay | Admitting: Lab

## 2021-06-30 NOTE — Progress Notes (Signed)
  Chronic Care Management   Outreach Note  06/30/2021 Name: Harold Waters [facility] MRN: [id number] DOB: August 19, 1946  Referred by: Cassandria Anger, MD Reason for referral : Medication Management   An unsuccessful telephone outreach was attempted today. The patient was referred to the pharmacist for assistance with care management and care coordination.   Follow Up Plan:   [place]

## 2021-07-15 ENCOUNTER — Telehealth: Payer: Self-pay | Admitting: Lab

## 2021-07-15 NOTE — Chronic Care Management (AMB) (Signed)
  Chronic Care Management   Note  07/15/2021 Name: Drevyn Roebuck [facility] MRN: [id number] DOB: Jan 16, 1946  Leighton Roach Servais is a 75 y.o. year old male who is a primary care patient of Plotnikov, [name], MD. I reached out to [place] by phone today in response to a referral sent by Mr. Jovoni Wootten Dillion's PCP, Plotnikov, [name], MD.   Mr. Brazzel was given information about Chronic Care Management services today including:  CCM service includes personalized support from designated clinical staff supervised by his physician, including individualized plan of care and coordination with other care providers 24/7 contact phone numbers for assistance for urgent and routine care needs. Service will only be billed when office clinical staff spend 20 minutes or more in a month to coordinate care. Only one practitioner may furnish and bill the service in a calendar month. The patient may stop CCM services at any time (effective at the end of the month) by phone call to the office staff.   Patient agreed to services and verbal consent obtained.   Follow up plan:  [place]

## 2021-07-18 ENCOUNTER — Other Ambulatory Visit: Payer: Self-pay | Admitting: General Practice

## 2021-07-19 ENCOUNTER — Other Ambulatory Visit: Payer: Self-pay | Admitting: Neurology

## 2021-07-22 DIAGNOSIS — R972 Elevated prostate specific antigen [PSA]: Secondary | ICD-10-CM | POA: Diagnosis not present

## 2021-07-29 DIAGNOSIS — R972 Elevated prostate specific antigen [PSA]: Secondary | ICD-10-CM | POA: Diagnosis not present

## 2021-08-09 ENCOUNTER — Other Ambulatory Visit: Payer: Self-pay

## 2021-08-09 DIAGNOSIS — I739 Peripheral vascular disease, unspecified: Secondary | ICD-10-CM

## 2021-08-11 ENCOUNTER — Ambulatory Visit (HOSPITAL_BASED_OUTPATIENT_CLINIC_OR_DEPARTMENT_OTHER): Payer: HMO | Attending: Neurology | Admitting: Internal Medicine

## 2021-08-20 NOTE — Progress Notes (Signed)
HISTORY AND PHYSICAL     CC:  follow up. Requesting Provider:  Plotnikov, [name], MD  HPI: This is a 75 y.o. male who is here today for follow up for PAD.  He has had left common femoral to above-the-knee popliteal artery bypass with vein by Dr. Donzetta Matters on 12/06/2018.    At his last visit, he has had edema of left lower extremity that has not had any significant improvement since surgery per patient.  He had a venous reflux study performed in July 2021, which demonstrated deep venous reflux with incompetent popliteal femoral and common femoral vein as well as incompetent small saphenous vein.  He also has varicose veins in his medial lower leg distal to his popliteal incision.  He states these have been been there for years however do not affect his day-to-day life.  He was measured and prescribed knee-high 15 to 20 mmHg compression at his last office visit which he states have offered relief and he is interested in purchasing a second pair.     He also described burning in his left thigh that was occurring without pattern.  It was not very bothersome to him however it does make him concerned about his bypass graft.  He was taking his aspirin and Plavix daily.  He was not smoking.  He is being followed by neurology for vascular dementia.  Hx of Covid May 2022.   The pt returns today for follow up.  He denies any non healing wounds or rest pain or claudication.  He states he has been taken off of his aspirin due to needing to have a procedure but this was not able to be done since his blood pressure was elevated.  He does not smoke.   He is off of his plavix as well.  He does continue to take his statin.  The pt is on a statin for cholesterol management.    The pt is on an aspirin.    Other AC:  none The pt is on CCB for hypertension.  The pt does not have diabetes. Tobacco hx:  former  Pt does not have family hx of AAA.  Past Medical History:  Diagnosis Date   Angina    Chronic stomach  ulcer    "get them off and on" (09/19/2018)   Coronary atherosclerosis of unspecified type of vessel, native or graft    Diverticulosis    DVT of lower extremity (deep venous thrombosis) ([place]) ~ 2010   LLE   Erosive gastritis    Essential hypertension, benign    GERD (gastroesophageal reflux disease)    Headache(784.0) 02/18/12   "lately"   History of kidney stones    Internal hemorrhoids    Lower back pain    Myocardial infarction ([place]) 1997   Osteoarthritis    Other and unspecified hyperlipidemia    PAD (peripheral artery disease) ([place])    with ABI's 0.8 on the right and 0.86 on the left   Pneumonia 1957   PSVT (paroxysmal supraventricular tachycardia) ([place]) 02/18/12   Shortness of breath    "lying down"   Sleep apnea    does not wear c-pap; pt does not recall this hx on 09/19/2018    Past Surgical History:  Procedure Laterality Date   ARTHROSCOPY KNEE W/ DRILLING Right ~ 2001   BYPASS GRAFT Left 12/05/2018   femoral popliteal    CARDIAC CATHETERIZATION  09/19/2018   CATARACT EXTRACTION     COLONOSCOPY     CORONARY  [place]   "2"   FEMORAL-POPLITEAL BYPASS GRAFT Left 12/06/2018   Procedure: BYPASS GRAFT FEMORAL-POPLITEAL ARTERY;  Surgeon: Waynetta Sandy, MD;  Location: [facility];  Service: Vascular;  Laterality: Left;   ILIAC ARTERY STENT  04/22/2017   . Placement of a 6 mm x 100 mm Viabahn covered stent left SFA   INTRAVASCULAR PRESSURE WIRE/FFR STUDY  09/19/2018   INTRAVASCULAR PRESSURE WIRE/FFR STUDY N/A 09/19/2018   Procedure: INTRAVASCULAR PRESSURE WIRE/FFR STUDY;  Surgeon: [name], MD;  Location: [facility];  Service: Cardiovascular;  Laterality: N/A;   LEFT HEART CATH AND CORONARY ANGIOGRAPHY N/A 09/19/2018   Procedure: LEFT HEART CATH AND CORONARY ANGIOGRAPHY;  Surgeon: [name], MD;  Location: [facility];  Service: Cardiovascular;  Laterality: N/A;   LEFT HEART CATHETERIZATION WITH CORONARY  ANGIOGRAM N/A 02/19/2012   Procedure: LEFT HEART CATHETERIZATION WITH CORONARY ANGIOGRAM;  Surgeon: Larey Dresser, MD;  Location: [facility];  Service: Cardiovascular;  Laterality: N/A;   LOWER EXTREMITY ANGIOGRAPHY Bilateral 04/22/2017   Procedure: Lower Extremity Angiography;  Surgeon: [name], MD;  Location: [facility];  Service: Cardiovascular;  Laterality: Bilateral;   LOWER EXTREMITY ANGIOGRAPHY Bilateral 09/19/2018   LOWER EXTREMITY ANGIOGRAPHY Bilateral 09/19/2018   Procedure: LOWER EXTREMITY ANGIOGRAPHY;  Surgeon: [name], MD;  Location: [facility];  Service: Cardiovascular;  Laterality: Bilateral;   PERIPHERAL ARTERIAL STENT GRAFT  09/2010   LLE   PERIPHERAL VASCULAR ATHERECTOMY Left 04/22/2017   Procedure: Peripheral Vascular Atherectomy;  Surgeon: [name], MD;  Location: [facility];  Service: Cardiovascular;  Laterality: Left;  SFA   PERIPHERAL VASCULAR INTERVENTION Left 04/22/2017   Procedure: Peripheral Vascular Intervention;  Surgeon: [name], MD;  Location: [facility];  Service: Cardiovascular;  Laterality: Left;  SFA   UPPER GASTROINTESTINAL ENDOSCOPY      Allergies  Allergen Reactions   Eggs Or Egg-Derived Products Itching   Aricept [Donepezil]     n/v   Coreg [Carvedilol]     fatigue    Current Outpatient Medications  Medication Sig Dispense Refill   mirtazapine (REMERON) 15 MG tablet TAKE 1/2 TABLET EVERY NIGHT (Patient not taking: Reported on 06/26/2021) 45 tablet 4   amLODipine (NORVASC) 10 MG tablet Take 1 tablet (10 mg total) by mouth daily. 90 tablet 1   aspirin EC 81 MG tablet Take 1 tablet (81 mg total) by mouth daily. 100 tablet 3   Blood Glucose Monitoring Suppl (ONETOUCH VERIO) w/Device KIT 1 Units by Does not apply route daily as needed. 1 kit 1   Cholecalciferol (VITAMIN D3) 50 MCG (2000 UT) capsule Take 1 capsule (2,000 Units total) by mouth daily. 100 capsule 3   glucose blood  (ONETOUCH VERIO) test strip Use to check blood sugar daily 50 each 11   memantine (NAMENDA) 10 MG tablet Take 1 tablet (10 mg total) by mouth daily. (Patient not taking: Reported on 06/26/2021) 30 tablet 11   Multiple Vitamins-Minerals (MENS MULTIVITAMIN PLUS) TABS Take 1 tablet by mouth at bedtime.      nitroGLYCERIN (NITROSTAT) 0.4 MG SL tablet Place 1 tablet under tongue every 5 mins. DO NOT USE MORE THAN 3 TABS 25 tablet 5   polyethylene glycol powder (GLYCOLAX/MIRALAX) 17 GM/SCOOP powder Take 17 g by mouth 2 (two) times daily as needed for moderate constipation. 500 g 3   rosuvastatin (CRESTOR) 40 MG tablet Take 1 tablet (  40 mg total) by mouth daily. PATIENT MUST ATTEND FUTURE APPOINTMENT FOR FUTURE REFILLS 90 tablet 1   No current facility-administered medications for this visit.    Family History  Problem Relation Age of Onset   Ulcers Father        had stomach issues, not sure what happened   Coronary artery disease Brother        male 1st degree relative <50   Colon cancer Neg Hx    Esophageal cancer Neg Hx    Rectal cancer Neg Hx    Stomach cancer Neg Hx     Social History   Socioeconomic History   Marital status: Married    Spouse name: Not on file   Number of children: 2   Years of education: Not on file   Highest education level: Not on file  Occupational History   Occupation: retired  Tobacco Use   Smoking status: Former    Packs/day: 1.00    Years: 40.00    Pack years: 40.00    Types: Cigarettes    Quit date: 04/03/2010    Years since quitting: 11.3   Smokeless tobacco: Never  Vaping Use   Vaping Use: Never used  Substance and Sexual Activity   Alcohol use: Not Currently    Comment: "used to drink when I was young; quit ~ 1980's"   Drug use: Never   Sexual activity: Yes  Other Topics Concern   Not on file  Social History Narrative   Right handed   Two story home   4 children   Social Determinants of Health   Financial Resource Strain: Not on file   Food Insecurity: Not on file  Transportation Needs: Not on file  Physical Activity: Not on file  Stress: Not on file  Social Connections: Not on file  Intimate Partner Violence: Not on file     REVIEW OF SYSTEMS:   [X] denotes positive finding, [ ] denotes negative finding Cardiac  Comments:  Chest pain or chest pressure:    Shortness of breath upon exertion:    Short of breath when lying flat:    Irregular heart rhythm:        Vascular    Pain in calf, thigh, or hip brought on by ambulation:    Pain in feet at night that wakes you up from your sleep:     Blood clot in your veins:    Leg swelling:         Pulmonary    Oxygen at home:    Productive cough:     Wheezing:         Neurologic    Sudden weakness in arms or legs:     Sudden numbness in arms or legs:     Sudden onset of difficulty speaking or slurred speech:    Temporary loss of vision in one eye:     Problems with dizziness:         Gastrointestinal    Blood in stool:     Vomited blood:         Genitourinary    Burning when urinating:     Blood in urine:        Psychiatric    Major depression:         Hematologic    Bleeding problems:    Problems with blood clotting too easily:        Skin    Rashes or ulcers:  Constitutional    Fever or chills:      PHYSICAL EXAMINATION:  Today's Vitals   08/25/21 1037 08/25/21 1058  BP: (!) 189/84 (!) 184/83  Pulse: 72   Resp: 18   Temp: 98 F (36.7 C)   TempSrc: Oral   SpO2: 97%   Weight: 151 lb (68.5 kg)   Height: 5' 5" (1.651 m)    Body mass index is 25.13 kg/m.   General:  WDWN in NAD; vital signs documented above Gait: Not observed HENT: WNL, normocephalic Pulmonary: normal non-labored breathing , without wheezing Cardiac: regular HR, without  Murmur; without carotid bruits Abdomen: soft, NT, no masses; aortic pulse is not palpable Skin: without rashes Vascular Exam/Pulses:  Right Left  Radial 2+ (normal) 2+ (normal)   Popliteal Unable to palpate Unable to palpate  DP 2+ (normal) 2+ (normal)  PT 2+ (normal) Unable to palpate   Extremities: without ischemic changes, without Gangrene , without cellulitis; without open wounds;  Musculoskeletal: no muscle wasting or atrophy  Neurologic: A&O X 3 Psychiatric:  The pt has Normal affect.   Non-Invasive Vascular Imaging:   ABI's/TBI's on 08/25/2021: Right:  1.06/0.81 - Great toe pressure: 155 Left:  1.03/0.75 - Great toe pressure: 144  Arterial duplex on 08/25/2021: Left Graft #1: CFA- pop  +--------------------+--------+--------+--------+------------+                      PSV cm/sStenosisWaveformComments      +--------------------+--------+--------+--------+------------+  Inflow              132             biphasic              +--------------------+--------+--------+--------+------------+  Proximal Anastomosis132             biphasic              +--------------------+--------+--------+--------+------------+  Proximal Graft      88              biphasic              +--------------------+--------+--------+--------+------------+  Mid Graft           129             biphasicplaque noted  +--------------------+--------+--------+--------+------------+  Distal Graft        38              biphasic              +--------------------+--------+--------+--------+------------+  Distal Anastomosis  41              biphasic              +--------------------+--------+--------+--------+------------+  Outflow             21              biphasic              +--------------------+--------+--------+--------+------------+     Summary:  Left: Patent bypass graft.  Previous ABI's/TBI's on 08/23/2020: Right:  1.08/0.75 - Great toe pressure: 147 Left:  0.97/0.68 - Great toe pressure:  133  Previous arterial duplex on 08/23/2020: Left: Patent bypass graft with an increase in velocity in the proximal  graft segment  suggestive of a 50-70% stenosis.    ASSESSMENT/PLAN:: 75 y.o. male here for follow up for PAD.  He has had left common femoral to above-the-knee popliteal artery bypass with vein by Dr. Donzetta Matters on 12/06/2018.    -  pt doing well with palpable pedal pulses and denies any rest pain, claudication or non healing wounds.   His ABI are unchanged and LLE bypass is patent.  -pt will f/u in one year with LLE arterial duplex and ABI-he knows to call sooner if he has any issues before then. -discussed with pt keeping a log of his BP in the am and pm to take to his PCP to see if any adjustments or additions need to be made to his antihypertensive regimen.   -he will continue his statin and restart his asa when he is cleared after his procedure.    Leontine Locket, [facility] [phone]  Clinic MD:   Trula Slade

## 2021-08-23 ENCOUNTER — Other Ambulatory Visit: Payer: Self-pay | Admitting: Neurology

## 2021-08-25 ENCOUNTER — Ambulatory Visit (INDEPENDENT_AMBULATORY_CARE_PROVIDER_SITE_OTHER)
Admission: RE | Admit: 2021-08-25 | Discharge: 2021-08-25 | Disposition: A | Discharge status: Home / Self Care | Payer: HMO | Source: Ambulatory Visit | Attending: Surgery | Admitting: Surgery

## 2021-08-25 ENCOUNTER — Encounter: Payer: Self-pay | Admitting: Physician Assistant

## 2021-08-25 ENCOUNTER — Other Ambulatory Visit: Payer: Self-pay

## 2021-08-25 ENCOUNTER — Ambulatory Visit (HOSPITAL_COMMUNITY)
Admission: RE | Admit: 2021-08-25 | Discharge: 2021-08-25 | Disposition: A | Discharge status: Home / Self Care | Payer: HMO | Source: Ambulatory Visit | Attending: Surgery | Admitting: Surgery

## 2021-08-25 ENCOUNTER — Ambulatory Visit: Payer: HMO | Admitting: Physician Assistant

## 2021-08-25 VITALS — BP 184/83 | HR 72 | Temp 98.0°F | Resp 18 | Ht 65.0 in | Wt 151.0 lb

## 2021-08-25 DIAGNOSIS — I739 Peripheral vascular disease, unspecified: Secondary | ICD-10-CM

## 2021-09-02 ENCOUNTER — Telehealth: Payer: HMO

## 2021-09-10 ENCOUNTER — Ambulatory Visit (INDEPENDENT_AMBULATORY_CARE_PROVIDER_SITE_OTHER): Payer: HMO | Admitting: Internal Medicine

## 2021-09-10 ENCOUNTER — Encounter: Payer: Self-pay | Admitting: Internal Medicine

## 2021-09-10 ENCOUNTER — Other Ambulatory Visit: Payer: Self-pay

## 2021-09-10 DIAGNOSIS — N183 Chronic kidney disease, stage 3 unspecified: Secondary | ICD-10-CM

## 2021-09-10 DIAGNOSIS — I10 Essential (primary) hypertension: Secondary | ICD-10-CM | POA: Diagnosis not present

## 2021-09-10 DIAGNOSIS — E118 Type 2 diabetes mellitus with unspecified complications: Secondary | ICD-10-CM

## 2021-09-10 DIAGNOSIS — I739 Peripheral vascular disease, unspecified: Secondary | ICD-10-CM | POA: Diagnosis not present

## 2021-09-10 MED ORDER — CLONIDINE 0.1 MG/24HR TD PTWK: 0.1000 mg | MEDICATED_PATCH | TRANSDERMAL | 12 refills | Status: DC

## 2021-09-10 NOTE — Assessment & Plan Note (Signed)
On Plavix, Crestor

## 2021-09-10 NOTE — Assessment & Plan Note (Addendum)
Control BP Avoid NSAIDs D/c Metformin 6/22 [place]: 2. Mildly atrophic right kidney. 3. No hydronephrosis.

## 2021-09-10 NOTE — Assessment & Plan Note (Addendum)
Cont w/Metformin 500 mg bid - not taking

## 2021-09-10 NOTE — Progress Notes (Signed)
Subjective:  Patient ID: Harold Waters, male    DOB: [birth year]  Age: 75 y.o. MRN: [id number]  CC: Hypertension (Daughter states BP has been running high)   HPI Harold Waters presents for HTN - poor control. Off Clonidine now F/u DM   Outpatient Medications Prior to Visit  Medication Sig Dispense Refill   amLODipine (NORVASC) 10 MG tablet Take 1 tablet (10 mg total) by mouth daily. 90 tablet 1   Blood Glucose Monitoring Suppl (ONETOUCH VERIO) w/Device KIT 1 Units by Does not apply route daily as needed. 1 kit 1   Cholecalciferol (VITAMIN D3) 50 MCG (2000 UT) capsule Take 1 capsule (2,000 Units total) by mouth daily. 100 capsule 3   glucose blood (ONETOUCH VERIO) test strip Use to check blood sugar daily 50 each 11   memantine (NAMENDA) 10 MG tablet TAKE 1 TABLET BY MOUTH EVERY DAY 90 tablet 0   Multiple Vitamins-Minerals (MENS MULTIVITAMIN PLUS) TABS Take 1 tablet by mouth at bedtime.      nitroGLYCERIN (NITROSTAT) 0.4 MG SL tablet Place 1 tablet under tongue every 5 mins. DO NOT USE MORE THAN 3 TABS 25 tablet 5   polyethylene glycol powder (GLYCOLAX/MIRALAX) 17 GM/SCOOP powder Take 17 g by mouth 2 (two) times daily as needed for moderate constipation. 500 g 3   rosuvastatin (CRESTOR) 40 MG tablet Take 1 tablet (40 mg total) by mouth daily. PATIENT MUST ATTEND FUTURE APPOINTMENT FOR FUTURE REFILLS 90 tablet 1   aspirin EC 81 MG tablet Take 1 tablet (81 mg total) by mouth daily. (Patient not taking: No sig reported) 100 tablet 3   mirtazapine (REMERON) 15 MG tablet TAKE 1/2 TABLET EVERY NIGHT (Patient not taking: No sig reported) 45 tablet 4   No facility-administered medications prior to visit.    ROS: Review of Systems  Constitutional:  Negative for appetite change, fatigue and unexpected weight change.  HENT:  Negative for congestion, nosebleeds, sneezing, sore throat and trouble swallowing.   Eyes:  Negative for itching and visual disturbance.  Respiratory:   Negative for cough.   Cardiovascular:  Negative for chest pain, palpitations and leg swelling.  Gastrointestinal:  Negative for abdominal distention, blood in stool, diarrhea and nausea.  Genitourinary:  Negative for frequency and hematuria.  Musculoskeletal:  Negative for back pain, gait problem, joint swelling and neck pain.  Skin:  Negative for rash.  Neurological:  Negative for dizziness, tremors, speech difficulty and weakness.  Psychiatric/Behavioral:  Positive for decreased concentration. Negative for agitation, dysphoric mood and sleep disturbance. The patient is not nervous/anxious.    Objective:  BP (!) 178/82   Pulse 66   Temp 98.1 F (36.7 C) (Oral)   Ht _0  (1.651 m)   Wt 151 lb 3.2 oz (68.6 kg)   SpO2 96%   BMI 25.16 kg/m   BP Readings from Last 3 Encounters:  09/10/21 (!) 178/82  08/25/21 (!) 184/83  06/26/21 (!) 142/82    Wt Readings from Last 3 Encounters:  09/10/21 151 lb 3.2 oz (68.6 kg)  08/25/21 151 lb (68.5 kg)  06/26/21 153 lb (69.4 kg)    Physical Exam Constitutional:      General: He is not in acute distress.    Appearance: Normal appearance. He is well-developed.     Comments: NAD  Eyes:     Conjunctiva/sclera: Conjunctivae normal.     Pupils: Pupils are equal, round, and reactive to light.  Neck:     Thyroid: No thyromegaly.  Vascular: No JVD.  Cardiovascular:     Rate and Rhythm: Normal rate and regular rhythm.     Heart sounds: Normal heart sounds. No murmur heard.   No friction rub. No gallop.  Pulmonary:     Effort: Pulmonary effort is normal. No respiratory distress.     Breath sounds: Normal breath sounds. No wheezing or rales.  Chest:     Chest wall: No tenderness.  Abdominal:     General: Bowel sounds are normal. There is no distension.     Palpations: Abdomen is soft. There is no mass.     Tenderness: There is no abdominal tenderness. There is no guarding or rebound.  Musculoskeletal:        General: No tenderness.  Normal range of motion.     Cervical back: Normal range of motion.  Lymphadenopathy:     Cervical: No cervical adenopathy.  Skin:    General: Skin is warm and dry.     Findings: No rash.  Neurological:     Mental Status: He is alert and oriented to person, place, and time.     Cranial Nerves: No cranial nerve deficit.     Motor: No abnormal muscle tone.     Coordination: Coordination normal.     Gait: Gait normal.     Deep Tendon Reflexes: Reflexes are normal and symmetric.  Psychiatric:        Behavior: Behavior normal.        Thought Content: Thought content normal.        Judgment: Judgment normal.    Lab Results  Component Value Date   WBC 6.6 06/26/2021   HGB 13.4 06/26/2021   HCT 40.7 06/26/2021   PLT 217.0 06/26/2021   GLUCOSE 112 (H) 06/26/2021   CHOL 276 (H) 01/30/2021   TRIG 145.0 01/30/2021   HDL 49.30 01/30/2021   LDLDIRECT 167 (H) 10/25/2019   LDLCALC 198 (H) 01/30/2021   ALT 16 06/26/2021   AST 15 06/26/2021   NA 141 06/26/2021   K 4.5 06/26/2021   CL 102 06/26/2021   CREATININE 1.78 (H) 06/26/2021   BUN 23 06/26/2021   CO2 32 06/26/2021   TSH 5.48 (H) 01/30/2021   PSA 9.90 (H) 06/26/2021   INR 0.99 11/28/2018   HGBA1C 7.3 (H) 06/26/2021    VAS [place] ABI WITH/WO TBI  Result Date: 08/25/2021  LOWER EXTREMITY DOPPLER STUDY Patient Name:  Harold Waters  Date of Exam:   08/25/2021 Medical Rec #: [id number]             Accession #:    [id number] Date of Birth: 05/05/1946              Patient Gender: M Patient Age:   20 years Exam Location:  [facility] Procedure:      VAS [place] ABI WITH/WO TBI Referring Phys: Harold Barban --------------------------------------------------------------------------------  Indications: Peripheral artery disease.  Vascular               Left CFA to AK popliteal bypass graft with vein on Interventions:         12/06/18. Comparison Study: 08/23/20 Performing Technologist: June Leap RDMS, RVT  Examination Guidelines:  A complete evaluation includes at minimum, Doppler waveform signals and systolic blood pressure reading at the level of bilateral brachial, anterior tibial, and posterior tibial arteries, when vessel segments are accessible. Bilateral testing is considered an integral part of a complete examination. Photoelectric Plethysmograph (PPG) waveforms and toe systolic pressure  readings are included as required and additional duplex testing as needed. Limited examinations for reoccurring indications may be performed as noted.  ABI Findings: +---------+------------------+-----+--------+--------+ Right    Rt Pressure (mmHg)IndexWaveformComment  +---------+------------------+-----+--------+--------+ Brachial 191                                     +---------+------------------+-----+--------+--------+ PTA      200               1.05 biphasic         +---------+------------------+-----+--------+--------+ DP       203               1.06 biphasic         +---------+------------------+-----+--------+--------+ Great Toe155               0.81 Normal           +---------+------------------+-----+--------+--------+ +---------+------------------+-----+--------+-------+ Left     Lt Pressure (mmHg)IndexWaveformComment +---------+------------------+-----+--------+-------+ PTA      197               1.03 biphasic        +---------+------------------+-----+--------+-------+ DP       179               0.94 biphasic        +---------+------------------+-----+--------+-------+ Great Toe144               0.75 Normal          +---------+------------------+-----+--------+-------+ +-------+-----------+-----------+------------+------------+ ABI/TBIToday's ABIToday's TBIPrevious ABIPrevious TBI +-------+-----------+-----------+------------+------------+ Right  1.06       0.81       1.08        0.75         +-------+-----------+-----------+------------+------------+ Left   1.03        0.75       0.97        0.68         +-------+-----------+-----------+------------+------------+   Summary: Right: Resting right ankle-brachial index is within normal range. No evidence of significant right lower extremity arterial disease. The right toe-brachial index is normal. Left: Resting left ankle-brachial index is within normal range. No evidence of significant left lower extremity arterial disease. The left toe-brachial index is normal.  *See table(s) above for measurements and observations.  Electronically signed by Harold Barban MD on 08/25/2021 at 5:19:14 PM.    Final    VAS [place] LOWER EXTREMITY BYPASS GRAFT DUPL  Result Date: 08/25/2021 LOWER EXTREMITY ARTERIAL DUPLEX STUDY Patient Name:  [facility] ANTONIO Lema  Date of Exam:   08/25/2021 Medical Rec #: [id number]             Accession #:    [id number] Date of Birth: 1946-05-10              Patient Gender: M Patient Age:   37 years Exam Location:  [facility] Procedure:      VAS [place] LOWER EXTREMITY BYPASS GRAFT DUPLEX Referring Phys: Harold Barban --------------------------------------------------------------------------------  Indications: Peripheral artery disease.  Vascular               Left CFA to AK popliteal bypass graft with vein on Interventions:         12/06/18. Current ABI:           R 1.06 L 1.03 Comparison Study: 08/23/20 Performing Technologist: June Leap RDMS, RVT  Examination Guidelines: A complete evaluation includes B-mode imaging,  spectral Doppler, color Doppler, and power Doppler as needed of all accessible portions of each vessel. Bilateral testing is considered an integral part of a complete examination. Limited examinations for reoccurring indications may be performed as noted.   Left Graft #1: CFA- pop +--------------------+--------+--------+--------+------------+                     PSV cm/sStenosisWaveformComments     +--------------------+--------+--------+--------+------------+ Inflow               132             biphasic             +--------------------+--------+--------+--------+------------+ Proximal Anastomosis132             biphasic             +--------------------+--------+--------+--------+------------+ Proximal Graft      88              biphasic             +--------------------+--------+--------+--------+------------+ Mid Graft           129             biphasicplaque noted +--------------------+--------+--------+--------+------------+ Distal Graft        38              biphasic             +--------------------+--------+--------+--------+------------+ Distal Anastomosis  41              biphasic             +--------------------+--------+--------+--------+------------+ Outflow             21              biphasic             +--------------------+--------+--------+--------+------------+  Summary: Left: Patent bypass graft.  See table(s) above for measurements and observations. Electronically signed by Harold Barban MD on 08/25/2021 at 5:18:02 PM.    Final     Assessment & Plan:   Problem List Items Addressed This Visit     CRI (chronic renal insufficiency), stage 3 (moderate) (HCC)    Control BP Avoid NSAIDs D/c Metformin 6/22 [place]: 2. Mildly atrophic right kidney. 3. No hydronephrosis.      Diabetes mellitus type 2 with complications (HCC)     Cont w/Metformin 500 mg bid - not taking      Essential hypertension    Clonidine - BP is better w/Clonidine He lost wt, eating better, less salt Start Clonidine patch Cont Norvasc RTC 1 month 6/22 [place]: 2. Mildly atrophic right kidney. 3. No hydronephrosis.      Relevant Medications   cloNIDine (CATAPRES - DOSED IN MG/24 HR) 0.1 mg/24hr patch   PAD (peripheral artery disease) (HCC)    On Plavix, Crestor      Relevant Medications   cloNIDine (CATAPRES - DOSED IN MG/24 HR) 0.1 mg/24hr patch      Meds ordered this encounter  Medications   cloNIDine (CATAPRES - DOSED IN MG/24  HR) 0.1 mg/24hr patch    Sig: Place 1 patch (0.1 mg total) onto the skin once a week.    Dispense:  4 patch    Refill:  12      Follow-up: Return in about 4 weeks (around 10/08/2021) for a follow-up visit.  Walker Kehr, MD

## 2021-09-10 NOTE — Assessment & Plan Note (Addendum)
Clonidine - BP is better w/Clonidine He lost wt, eating better, less salt Start Clonidine patch Cont Norvasc RTC 1 month 6/22 [place]: 2. Mildly atrophic right kidney. 3. No hydronephrosis.

## 2021-09-12 ENCOUNTER — Telehealth: Payer: Self-pay

## 2021-09-12 NOTE — Progress Notes (Signed)
    Chronic Care Management Pharmacy Assistant   Name: Donne Baley  MRN: [id number] DOB: 1946/09/28  Harold Waters is an 75 y.o. year old male who presents for his initial CCM visit with the clinical pharmacist.  Reason for Encounter: Initial Visit    Recent office visits:  09/10/21 Plotnikov, [name], MD-PCP (Essential hypertension) med changes: clonidine patch  Recent consult visits:  08/25/21 Gabriel Earing, PA-C (PAD) No orders or med changes  06/26/21 Hoyt Koch, MD-Internal Medicine (Elevated BP Concerns) Labs ordered, started amlodipine 10 mg  05/01/21 Default, Provider, MD-Neurology (Vascular dementia without behavioral disturbance) orders: home sleep test, med changes: Mirtazapine 15 mg  Hospital visits:  None in previous 6 months  Medications: Outpatient Encounter Medications as of 09/12/2021  Medication Sig Note   amLODipine (NORVASC) 10 MG tablet Take 1 tablet (10 mg total) by mouth daily.    aspirin EC 81 MG tablet Take 1 tablet (81 mg total) by mouth daily. (Patient not taking: No sig reported)    Blood Glucose Monitoring Suppl (ONETOUCH VERIO) w/Device KIT 1 Units by Does not apply route daily as needed.    Cholecalciferol (VITAMIN D3) 50 MCG (2000 UT) capsule Take 1 capsule (2,000 Units total) by mouth daily.    cloNIDine (CATAPRES - DOSED IN MG/24 HR) 0.1 mg/24hr patch Place 1 patch (0.1 mg total) onto the skin once a week.    glucose blood (ONETOUCH VERIO) test strip Use to check blood sugar daily    memantine (NAMENDA) 10 MG tablet TAKE 1 TABLET BY MOUTH EVERY DAY    mirtazapine (REMERON) 15 MG tablet TAKE 1/2 TABLET EVERY NIGHT (Patient not taking: No sig reported) 06/26/2021: Hasn't started yet   Multiple Vitamins-Minerals (MENS MULTIVITAMIN PLUS) TABS Take 1 tablet by mouth at bedtime.     nitroGLYCERIN (NITROSTAT) 0.4 MG SL tablet Place 1 tablet under tongue every 5 mins. DO NOT USE MORE THAN 3 TABS    polyethylene glycol powder  (GLYCOLAX/MIRALAX) 17 GM/SCOOP powder Take 17 g by mouth 2 (two) times daily as needed for moderate constipation.    rosuvastatin (CRESTOR) 40 MG tablet Take 1 tablet (40 mg total) by mouth daily. PATIENT MUST ATTEND FUTURE APPOINTMENT FOR FUTURE REFILLS    No facility-administered encounter medications on file as of 09/12/2021.   Medication List: amLODipine (NORVASC) 10 MG tablet-last fill 06/26/21 90 ds aspirin EC 81 MG tablet Blood Glucose Monitoring Suppl (ONETOUCH VERIO) w/Device KIT Cholecalciferol (VITAMIN D3) 50 MCG (2000 UT) capsule cloNIDine (CATAPRES - DOSED IN MG/24 HR) 0.1 mg/24hr patch-last fill 09/11/21 84 ds glucose blood (ONETOUCH VERIO) test strip memantine (NAMENDA) 10 MG tablet-last fill 07/24/21 30 ds mirtazapine (REMERON) 15 MG tablet-last fill 08/12/21 Multiple Vitamins-Minerals (MENS MULTIVITAMIN PLUS) TABS nitroGLYCERIN (NITROSTAT) 0.4 MG SL tablet polyethylene glycol powder (GLYCOLAX/MIRALAX) 17 GM/SCOOP powder rosuvastatin (CRESTOR) 40 MG tablet-last fill 07/21/21 90 ds  Care Gaps: Colonoscopy-12/06/07 Diabetic Foot Exam-NA Ophthalmology-NA Dexa Scan - NA Annual Well Visit - NA Micro albumin-NA Hemoglobin A1c- 06/26/21  Star Rating Drugs: rosuvastatin (CRESTOR) 40 MG tablet-last fill 07/21/21 90 ds   Ethelene Hal Clinical Pharmacist Assistant [phone]

## 2021-09-15 DIAGNOSIS — R972 Elevated prostate specific antigen [PSA]: Secondary | ICD-10-CM | POA: Diagnosis not present

## 2021-09-15 DIAGNOSIS — C61 Malignant neoplasm of prostate: Secondary | ICD-10-CM | POA: Diagnosis not present

## 2021-09-18 ENCOUNTER — Encounter: Payer: Self-pay | Admitting: Internal Medicine

## 2021-09-19 ENCOUNTER — Ambulatory Visit (HOSPITAL_BASED_OUTPATIENT_CLINIC_OR_DEPARTMENT_OTHER): Payer: HMO | Attending: Neurology | Admitting: Internal Medicine

## 2021-09-19 ENCOUNTER — Other Ambulatory Visit: Payer: Self-pay

## 2021-09-19 ENCOUNTER — Ambulatory Visit (INDEPENDENT_AMBULATORY_CARE_PROVIDER_SITE_OTHER): Payer: HMO

## 2021-09-19 DIAGNOSIS — I70212 Atherosclerosis of native arteries of extremities with intermittent claudication, left leg: Secondary | ICD-10-CM

## 2021-09-19 DIAGNOSIS — G4733 Obstructive sleep apnea (adult) (pediatric): Secondary | ICD-10-CM | POA: Diagnosis not present

## 2021-09-19 DIAGNOSIS — I10 Essential (primary) hypertension: Secondary | ICD-10-CM

## 2021-09-19 DIAGNOSIS — R400 Somnolence: Secondary | ICD-10-CM

## 2021-09-19 DIAGNOSIS — I25119 Atherosclerotic heart disease of native coronary artery with unspecified angina pectoris: Secondary | ICD-10-CM

## 2021-09-19 DIAGNOSIS — G4700 Insomnia, unspecified: Secondary | ICD-10-CM | POA: Insufficient documentation

## 2021-09-19 DIAGNOSIS — F0150 Vascular dementia without behavioral disturbance: Secondary | ICD-10-CM

## 2021-09-19 NOTE — Patient Instructions (Signed)
Visit Information   PATIENT GOALS/PLAN OF CARE:  Care Plan : [place]  Updates made by Tomasa Blase, RPH since 09/19/2021 12:00 AM     Problem: Hypertension, Hyperlipidemia, Coronary Artery Disease, and Chronic Kidney Disease   Priority: High  Onset Date: 09/19/2021     Long-Range Goal: Disease Management   Start Date: 09/19/2021  Expected End Date: 09/19/2022  This Visit's Progress: On track  Priority: High  Note:   Current Barriers:  Unable to independently monitor therapeutic efficacy Does not adhere to prescribed medication regimen  Pharmacist Clinical Goal(s):  Patient will achieve adherence to monitoring guidelines and medication adherence to achieve therapeutic efficacy achieve improvement in blood pressure and HLD as evidenced by BP/ HR readings and LDL level with next lab through collaboration with PharmD and provider.   Interventions: 1:1 collaboration with Plotnikov, [name], MD regarding development and update of comprehensive plan of care as evidenced by provider attestation and co-signature Inter-disciplinary care team collaboration (see longitudinal plan of care) Comprehensive medication review performed; medication list updated in electronic medical record  Hypertension (BP goal <140/90) -Uncontrolled -Current treatment: Clonidine 0.7m patch - 1 patch once weekly - has not started at this time  Amlodipine 192m- 1 tablet daily  -Medications previously tried: carvedilol, isosorbide mononitrate,   -Current home readings: reports BP averages 170-180/80's BP ~90 BPM -Current dietary habits: reports to low sodium diet, drinks coffee in AM and coke zero throughout the day  -Current exercise habits: 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, 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  -Denies hypotensive/hypertensive symptoms -Educated on BP goals and benefits of medications for prevention of heart attack, stroke and kidney damage; Daily salt intake goal < 2300 mg; Exercise  goal of 150 minutes per week; Importance of home blood pressure monitoring; Proper BP monitoring technique; -Counseled to monitor BP at home daily, document, and provide log at future appointments -Counseled on diet and exercise extensively Recommended for patient to start clonidine 0.48m46meekly patch  Hyperlipidemia/ Coronary Artery Disease / Previous MI: (LDL goal < 70) -Uncontrolled Lab Results  Component Value Date   LDLCALC 198 (H) 01/30/2021  -Current treatment: Rosuvastatin 25m38m1 tablet daily  - notes that this medication is missed at times - maybe 1 tablet a week ASA 848mg44m tablet daily  Nitroglycerin 0.4mg -76mtablet every 5 minutes as needed  -Medications previously tried: n/a  -Current dietary patterns: reports to eating a low cholesterol diet -Current exercise habits: 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, active throughout the day -Educated on Cholesterol goals;  Benefits of statin for ASCVD risk reduction; Importance of limiting foods high in cholesterol; Exercise goal of 150 minutes per week; -Counseled on diet and exercise extensively Recommended to take rosuvastatin as prescribed, emphasized adherence to regimen, also advised for patient to start repatha 420mg m648mly infusion due to elevated LDL level and increased ASCVD risk due to previous MI  Memory Loss (Goal: Prevention of disease progression) - Follow with Neurology -Controlled -Current treatment  Memantine 10mg - 50mblet daily  -Medications previously tried: donepezil  -Recommended to continue current medication  Chronic Kidney Disease (Goal: Prevention of Disease Progression) -Stable -Last eGFR: 36.84 mL/min  -Current treatment  Avoidance of nephrotoxic agents / adequate BP control to prevent damage -Medications previously tried: n/a  -Recommended to continue current medication   Health Maintenance -Vaccine gaps: shingles -Current therapy:  Multivitamin  - 1 tablet daily  Vitamin D3 2000units - 1  tablet daily  Miralax -  1 capful twice daily as needed  -Educated on Cost vs benefit of each product must be carefully weighed by individual consumer -Patient is satisfied with current therapy and denies issues -Recommended to continue current medication  Patient Goals/Self-Care Activities Patient will:  - take medications as prescribed as evidenced by patient report and record review focus on medication adherence by not missing any doses of prescribed medication check blood pressure daily, document, and provide at future appointments  Follow Up Plan: Telephone follow up appointment with care management team member scheduled for: 2 months The patient has been provided with contact information for the care management team and has been advised to call with any health related questions or concerns.       Consent to CCM Services: Mr. Hellstrom was given information about Chronic Care Management services including:  CCM service includes personalized support from designated clinical staff supervised by his physician, including individualized plan of care and coordination with other care providers 24/7 contact phone numbers for assistance for urgent and routine care needs. Service will only be billed when office clinical staff spend 20 minutes or more in a month to coordinate care. Only one practitioner may furnish and bill the service in a calendar month. The patient may stop CCM services at any time (effective at the end of the month) by phone call to the office staff. The patient will be responsible for cost sharing (co-pay) of up to 20% of the service fee (after annual deductible is met).  Patient agreed to services and verbal consent obtained.   Patient verbalizes understanding of instructions provided today and agrees to view in [place].   Telephone follow up appointment with care management team member scheduled for: 2 months The patient has been provided with contact information for the  care management team and has been advised to call with any health related questions or concerns.   Tomasa Blase, PharmD Clinical Pharmacist, [place]

## 2021-09-19 NOTE — Progress Notes (Addendum)
Chronic Care Management Pharmacy Note  09/19/2021 Name:  Harold Waters [facility] MRN:  [id number] DOB:  05-02-1946  Summary: -Spoke with patient and his daughter [name] - report that patient has not yet started clonidine patches, wanted to discuss possible side effects before starting  -Patient was to sleep medicine today, plan to start sleep apnea machine to help with sleep quality   Recommendations/Changes made from today's visit: -Reviewed possible side effects of clonidine patch with patient and daughter, main concern was over rebound hypertension, as patient was previously on clonidine tablets, reviewed that due to history of noncompliance with medications due to frequent dosing schedule he would be at lower risk of rebound hypertension with the clonidine patch   - patient fills up weekly medication box for patient once weekly, will plan to start patch with next visit and change when she is there weekly -Patient will continue to monitor blood pressure and HR daily, will reach out should BP remain elevated >140/90 / if he has issues with hypotension / bradycardia  - has follow up with PCP in 4 weeks -Recommending for patient to start repatha 422m monthly infusion to help lower LDL due to elevated LDL and previous history of MI  Subjective: Harold Vanallenis an 75y.o. year old male who is a primary patient of Plotnikov, AEvie Lacks MD.  The CCM team was consulted for assistance with disease management and care coordination needs.    Engaged with patient by telephone for initial visit in response to provider referral for pharmacy case management and/or care coordination services.   Consent to Services:  The patient was given the following information about Chronic Care Management services today, agreed to services, and gave verbal consent: 1. CCM service includes personalized support from designated clinical staff supervised by the primary care provider, including individualized plan of  care and coordination with other care providers 2. 24/7 contact phone numbers for assistance for urgent and routine care needs. 3. Service will only be billed when office clinical staff spend 20 minutes or more in a month to coordinate care. 4. Only one practitioner may furnish and bill the service in a calendar month. 5.The patient may stop CCM services at any time (effective at the end of the month) by phone call to the office staff. 6. The patient will be responsible for cost sharing (co-pay) of up to 20% of the service fee (after annual deductible is met). Patient agreed to services and consent obtained.  Patient Care Team: Plotnikov, AEvie Lacks MD as PCP - General (Internal Medicine) CSherren Mocha MD as PCP - Cardiology (Cardiology) CStanford BreedBDenice Bors MD as Consulting Physician (Cardiology) CWaynetta Sandy MD as Consulting Physician (Vascular Surgery) CSherren Mocha MD as Consulting Physician (Cardiology) ACameron Sprang MD as Consulting Physician (Neurology) DMarlou Sa GTonna Corner MD as Consulting Physician (Orthopedic Surgery) SLadene Artist MD as Consulting Physician (Gastroenterology) STomasa Blase RAtlanta West End[facility] Center LLCas Pharmacist (Pharmacist)  Recent office visits:  09/10/21 Plotnikov, AEvie Lacks MD-PCP (Essential hypertension) med changes: clonidine patch, not taking metformin currently - metformin discontinued - f/u in 4 weeks    Recent consult visits:  08/25/21 RGabriel Earing PA-C (PAD) No orders or med changes   06/26/21 CHoyt Koch MD-Internal Medicine (Elevated BP Concerns) Labs ordered, started amlodipine 10 mg - clonidine stopped - compliance an issue    05/01/21 AEllouise Newer MD-Neurology (Vascular dementia without behavioral disturbance) orders: home sleep test, med changes: Mirtazapine 15 mg - 1/2 tablet daily, possible to  restart memantine if doing well on mirtazapine    Hospital visits:  None in previous 6 months  Objective:  Lab Results   Component Value Date   CREATININE 1.78 (H) 06/26/2021   BUN 23 06/26/2021   GFR 36.84 (L) 06/26/2021   GFRNONAA 46 (L) 03/10/2021   GFRAA 77 10/25/2019   NA 141 06/26/2021   K 4.5 06/26/2021   CALCIUM 9.5 06/26/2021   CO2 32 06/26/2021   GLUCOSE 112 (H) 06/26/2021    Lab Results  Component Value Date/Time   HGBA1C 7.3 (H) 06/26/2021 10:02 AM   HGBA1C 7.2 (H) 01/30/2021 08:01 AM   GFR 36.84 (L) 06/26/2021 10:02 AM   GFR 42.63 (L) 01/30/2021 08:01 AM    Last diabetic Eye exam:  No results found for: HMDIABEYEEXA  Last diabetic Foot exam:  No results found for: HMDIABFOOTEX   Lab Results  Component Value Date   CHOL 276 (H) 01/30/2021   HDL 49.30 01/30/2021   LDLCALC 198 (H) 01/30/2021   LDLDIRECT 167 (H) 10/25/2019   TRIG 145.0 01/30/2021   CHOLHDL 6 01/30/2021    Hepatic Function Latest Ref Rng & Units 06/26/2021 01/30/2021 01/30/2021  Total Protein 6.0 - 8.3 g/dL 6.6 6.3 6.3  Albumin 3.5 - 5.2 g/dL 4.1 4.2 4.2  AST 0 - 37 U/L _0 ALT 0 - 53 U/L _1 Alk Phosphatase 39 - 117 U/L 69 74 74  Total Bilirubin 0.2 - 1.2 mg/dL 0.7 0.5 0.5  Bilirubin, Direct 0.0 - 0.3 mg/dL - 0.1 -    Lab Results  Component Value Date/Time   TSH 5.48 (H) 01/30/2021 08:01 AM   TSH 4.91 (H) 07/03/2019 11:34 AM   FREET4 0.93 01/30/2021 08:01 AM   FREET4 0.77 07/04/2019 12:22 PM    CBC Latest Ref Rng & Units 06/26/2021 03/10/2021 07/03/2019  WBC 4.0 - 10.5 K/uL 6.6 7.1 6.2  Hemoglobin 13.0 - 17.0 g/dL 13.4 13.4 13.8  Hematocrit 39.0 - 52.0 % 40.7 40.4 41.8  Platelets 150.0 - 400.0 K/uL 217.0 186 239.0    Lab Results  Component Value Date/Time   VD25OH 38.96 07/03/2019 11:34 AM    Clinical ASCVD: Yes  The ASCVD Risk score (Arnett DK, et al., 2019) failed to calculate for the following reasons:   The patient has a prior MI or stroke diagnosis    Depression screen [facility] 2/9 03/29/2020 03/05/2020 03/05/2020  Decreased Interest 0 0 (No Data)  Down, Depressed, Hopeless 0 0 -  PHQ -  2 Score 0 0 -  Some recent data might be hidden    Social History   Tobacco Use  Smoking Status Former   Packs/day: 1.00   Years: 40.00   Pack years: 40.00   Types: Cigarettes   Quit date: 04/03/2010   Years since quitting: 11.4  Smokeless Tobacco Never   BP Readings from Last 3 Encounters:  09/10/21 (!) 178/82  08/25/21 (!) 184/83  06/26/21 (!) 142/82   Pulse Readings from Last 3 Encounters:  09/10/21 66  08/25/21 72  06/26/21 (!) 56   Wt Readings from Last 3 Encounters:  09/10/21 151 lb 3.2 oz (68.6 kg)  08/25/21 151 lb (68.5 kg)  06/26/21 153 lb (69.4 kg)   BMI Readings from Last 3 Encounters:  09/10/21 25.16 kg/m  08/25/21 25.13 kg/m  06/26/21 25.46 kg/m    Assessment/Interventions: Review of patient past medical history, allergies, medications, health status, including review of consultants reports, laboratory and other test data,  was performed as part of comprehensive evaluation and provision of chronic care management services.   SDOH:  (Social Determinants of Health) assessments and interventions performed: Yes  SDOH Screenings   Alcohol Screen: Not on file  Depression (PHQ2-9): Not on file  Financial Resource Strain: Not on file  Food Insecurity: Not on file  Housing: Not on file  Physical Activity: Not on file  Social Connections: Not on file  Stress: Not on file  Tobacco Use: Medium Risk   Smoking Tobacco Use: Former   Smokeless Tobacco Use: Never   Passive Exposure: Not on file  Transportation Needs: Not on file    [place]  Allergies  Allergen Reactions   Eggs Or Egg-Derived Products Itching   Aricept [Donepezil]     n/v   Coreg [Carvedilol]     fatigue    Medications Reviewed Today     Reviewed by Cassandria Anger, MD (Physician) on 09/10/21 at 1452  Med List Status: <None>   Medication Order Taking? Sig Documenting Provider Last Dose Status Informant  amLODipine (NORVASC) 10 MG tablet [id number] Yes Take 1 tablet (10 mg  total) by mouth daily. Hoyt Koch, MD Taking Active   aspirin EC 81 MG tablet [id number] No Take 1 tablet (81 mg total) by mouth daily.  Patient not taking: No sig reported   Plotnikov, [name], MD Not Taking Active   Blood Glucose Monitoring Suppl (ONETOUCH VERIO) w/Device KIT [id number] Yes 1 Units by Does not apply route daily as needed. Plotnikov, [name], MD Taking Active   Cholecalciferol (VITAMIN D3) 50 MCG (2000 UT) capsule [id number] Yes Take 1 capsule (2,000 Units total) by mouth daily. Plotnikov, [name], MD Taking Active   cloNIDine (CATAPRES - DOSED IN MG/24 HR) 0.1 mg/24hr patch [id number] Yes Place 1 patch (0.1 mg total) onto the skin once a week. Plotnikov, [name], MD  Active   glucose blood (ONETOUCH VERIO) test strip [id number] Yes Use to check blood sugar daily Plotnikov, [name], MD Taking Active   memantine (NAMENDA) 10 MG tablet [id number] Yes TAKE 1 TABLET BY MOUTH EVERY DAY Cameron Sprang, MD Taking Active   mirtazapine (REMERON) 15 MG tablet [id number] No TAKE 1/2 TABLET EVERY NIGHT  Patient not taking: No sig reported   Cameron Sprang, MD Not Taking Active            Med Note Thomes Cake   Thu Jun 26, 2021  9:38 AM) Hasn't started yet  Multiple Vitamins-Minerals (MENS MULTIVITAMIN PLUS) TABS [id number] Yes Take 1 tablet by mouth at bedtime.  [provider] Taking Active Self  nitroGLYCERIN (NITROSTAT) 0.4 MG SL tablet [id number] Yes Place 1 tablet under tongue every 5 mins. DO NOT USE MORE THAN 3 TABS Plotnikov, [name], MD Taking Active   polyethylene glycol powder (GLYCOLAX/MIRALAX) 17 GM/SCOOP powder [id number] Yes Take 17 g by mouth 2 (two) times daily as needed for moderate constipation. Plotnikov, [name], MD Taking Active   rosuvastatin (CRESTOR) 40 MG tablet [id number] Yes Take 1 tablet (40 mg total) by mouth daily. PATIENT MUST ATTEND FUTURE APPOINTMENT FOR FUTURE REFILLS Deberah Pelton, NP Taking Active              Patient Active Problem List   Diagnosis Date Noted   Weight loss, unintentional 06/26/2021   COVID-19 03/11/2021   Elevated PSA, between 10 and less than 20 ng/ml 01/31/2021   CRI (chronic renal insufficiency), stage 3 (moderate) ([place]) 01/31/2021  LLQ abdominal pain 01/29/2021   Constipation 01/29/2021   Distorted vision 08/23/2020   Urinary frequency 08/23/2020   Knee pain, bilateral 05/22/2020   Fatigue 09/05/2019   Insomnia 07/03/2019   Memory loss 07/03/2019   Vitamin D deficiency, unspecified 12/12/2018   Other hemorrhoids 12/12/2018   Personal history of other venous thrombosis and embolism 12/12/2018   Muscle weakness (generalized) 12/12/2018   Long term (current) use of aspirin 12/12/2018   Hyperglycemia, unspecified 12/12/2018   Diverticulosis of intestine, part unspecified, without perforation or abscess without bleeding 12/12/2018   Difficulty in walking, not elsewhere classified 12/12/2018   Deficiency of other specified B group vitamins 12/12/2018   Pre-operative clearance 11/25/2018   PAD (peripheral artery disease) ([place]) 10/18/2018   Chest pain 09/20/2018   Claudication in peripheral vascular disease ([place]) 04/22/2017   Pain in joint, shoulder region 06/22/2016   Diabetes mellitus type 2 with complications ([place]) 43/15/4008   Well adult exam 12/19/2014   Migraine headache with aura 01/27/2013   CAD (coronary artery disease) 10/03/2012   Actinic keratosis 09/06/2012   Unstable angina ([place]) 02/18/2012   PSVT (paroxysmal supraventricular tachycardia) ([place]) 02/18/2012   Pneumonia, community acquired 10/12/2011   Atherosclerosis of native artery of extremity with intermittent claudication ([place]) 06/02/2010   CAROTID ARTERY DISEASE 04/23/2010   OSTEOARTHRITIS 02/27/2010   Arthralgia 02/27/2010   LOW BACK PAIN 02/27/2010   PHARYNGITIS, ACUTE 06/18/2009   DYSPNEA 04/03/2009   ACUTE BRONCHITIS 02/12/2009   VENOUS INSUFFICIENCY 12/19/2008   EDEMA 12/19/2008    GERD 02/20/2008   Abdominal pain 02/20/2008   Neoplasm of uncertain behavior of skin 10/18/2007   COUGH 10/18/2007   Dyslipidemia 09/07/2007   Essential hypertension 09/07/2007   MYOCARDIAL INFARCTION 09/07/2007   CHEST PAIN, ATYPICAL, HX OF 09/07/2007   APPENDECTOMY, HX OF 09/07/2007   PERCUTANEOUS TRANSLUMINAL CORONARY ANGIOPLASTY, HX OF 09/07/2007   ARTHROSCOPY, KNEE, HX OF 09/07/2007    Immunization History  Administered Date(s) Administered   Fluad Quad(high Dose 65+) 09/05/2019   Hepatitis B 09/01/2007, 10/18/2007, 04/23/2008   Influenza Whole 08/20/2008   Pneumococcal Conjugate-13 06/22/2016   Pneumococcal Polysaccharide-23 12/19/2014   Td 12/19/2014    Conditions to be addressed/monitored:  Hypertension, Hyperlipidemia, Coronary Artery Disease, and Chronic Kidney Disease  There are no care plans that you recently modified to display for this patient.     Medication Assistance: None required.  Patient affirms current coverage meets needs.  Care Gaps: Colonoscopy, Foot exam, Hep C screening, ophthalmology exam  Patient's preferred pharmacy is:  [facility], [address] [place] [address] Phone: [phone] Fax: [phone]   Uses pill box? Yes Pt endorses 70-80% compliance  Care Plan and Follow Up Patient Decision:  Patient agrees to Care Plan and Follow-up.  Plan: Telephone follow up appointment with care management team member scheduled for:  2 months The patient has been provided with contact information for the care management team and has been advised to call with any health related questions or concerns.   Tomasa Blase, PharmD Clinical Pharmacist, [place] screening examination/treatment/procedure(s) were performed by non-physician practitioner and as supervising physician I was immediately available for consultation/collaboration.  I agree with above. Lew Dawes,  MD

## 2021-09-19 NOTE — Telephone Encounter (Signed)
FYI

## 2021-09-22 DIAGNOSIS — C61 Malignant neoplasm of prostate: Secondary | ICD-10-CM | POA: Diagnosis not present

## 2021-09-24 DIAGNOSIS — C61 Malignant neoplasm of prostate: Secondary | ICD-10-CM | POA: Diagnosis not present

## 2021-10-01 DIAGNOSIS — I25119 Atherosclerotic heart disease of native coronary artery with unspecified angina pectoris: Secondary | ICD-10-CM

## 2021-10-01 DIAGNOSIS — I10 Essential (primary) hypertension: Secondary | ICD-10-CM

## 2021-10-03 ENCOUNTER — Telehealth: Payer: Self-pay | Admitting: Radiation Oncology

## 2021-10-03 NOTE — Telephone Encounter (Signed)
LVM to schedule with Dr. Tammi Klippel

## 2021-10-04 DIAGNOSIS — F0150 Vascular dementia without behavioral disturbance: Secondary | ICD-10-CM | POA: Diagnosis not present

## 2021-10-04 NOTE — Procedures (Signed)
   Patient Name: Harold Waters, Harold Waters Date: 09/20/2021 Gender: Male D.O.B: Feb 18, 1946 Age (years): 66 Referring Provider: Cameron Sprang Height (inches): 78 Interpreting Physician: Baird Lyons MD, ABSM Weight (lbs): 150 RPSGT: Gerhard Perches BMI: 25 MRN: [id number] Neck Size: 14.00  CLINICAL INFORMATION Sleep Study Type: HST Indication for sleep study: Insomnia Epworth Sleepiness Score: 12  SLEEP STUDY TECHNIQUE A multi-channel overnight portable sleep study was performed. The channels recorded were: nasal airflow, thoracic respiratory movement, and oxygen saturation with a pulse oximetry. Snoring was also monitored.  MEDICATIONS Patient self administered medications include: none reported.  SLEEP ARCHITECTURE Patient was studied for 364 minutes. The sleep efficiency was 99.7 % and the patient was supine for 80.6%. The arousal index was 0.0 per hour.  RESPIRATORY PARAMETERS The overall AHI was 39.2 per hour, with a central apnea index of 0.2 per hour. The oxygen nadir was 80% during sleep.  CARDIAC DATA Mean heart rate during sleep was 71.3 bpm.  IMPRESSIONS - Severe obstructive sleep apnea occurred during this study (AHI = 39.2/h). - No significant central sleep apnea occurred during this study (CAI = 0.2/h). - Moderate oxygen desaturation was noted during this study (Min O2 = 80%). Mean O2 saturatiton 96%. - Patient snored.  DIAGNOSIS - Obstructive Sleep Apnea (G47.33)  RECOMMENDATIONS - Suggest CPAP titration sleep study or autopap. Other options would be based on clinical judgment. - Be careful with alcohol, sedatives and other CNS depressants that may worsen sleep apnea and disrupt normal sleep architecture. - Sleep hygiene should be reviewed to assess factors that may improve sleep quality. - Weight management and regular exercise should be initiated or continued.  [Electronically signed] 10/04/2021 12:29 PM  Baird Lyons MD, [place], American  Board of Sleep Medicine   NPI: [id number]                          [place], [place] of Sleep Medicine  ELECTRONICALLY SIGNED ON:  10/04/2021, 12:27 PM [place] PH: [phone]   FX: [phone] [place]

## 2021-10-07 ENCOUNTER — Encounter: Payer: Self-pay | Admitting: Internal Medicine

## 2021-10-07 DIAGNOSIS — C61 Malignant neoplasm of prostate: Secondary | ICD-10-CM | POA: Diagnosis not present

## 2021-10-08 ENCOUNTER — Other Ambulatory Visit: Payer: Self-pay | Admitting: Internal Medicine

## 2021-10-08 DIAGNOSIS — I10 Essential (primary) hypertension: Secondary | ICD-10-CM

## 2021-10-08 DIAGNOSIS — N183 Chronic kidney disease, stage 3 unspecified: Secondary | ICD-10-CM

## 2021-10-08 MED ORDER — HYDRALAZINE HCL 25 MG PO TABS: 25.0000 mg | ORAL_TABLET | Freq: Three times a day (TID) | ORAL | 5 refills | Status: DC

## 2021-10-08 NOTE — Progress Notes (Signed)
GU Location of Tumor / Histology: Prostate Ca  If Prostate Cancer, Gleason Score is (4 + 3) and PSA is (14.5 as of 07/22/21)  Biopsies  Dr. Louis Meckel         Past/Anticipated interventions by urology, if any:   Past/Anticipated interventions by medical oncology, if any:   Weight changes, if any: Lost 25 pounds in May/June.  IPSS:  25 SHIM:  11  Bowel/Bladder complaints, if any:  Constipation uses Miralax and  no bladder.  Nausea/Vomiting, if any: No  Pain issues, if any:  0/10  SAFETY ISSUES: Prior radiation? No Pacemaker/ICD?  No Possible current pregnancy?  Male Is the patient on methotrexate?  No  Current Complaints / other details:  Need to understand the treatment options.

## 2021-10-10 ENCOUNTER — Other Ambulatory Visit: Payer: Self-pay

## 2021-10-10 ENCOUNTER — Ambulatory Visit
Admission: RE | Admit: 2021-10-10 | Discharge: 2021-10-10 | Disposition: A | Discharge status: Home / Self Care | Payer: HMO | Source: Ambulatory Visit | Attending: Radiation Oncology | Admitting: Radiation Oncology

## 2021-10-10 VITALS — BP 166/83 | HR 71 | Temp 97.5°F | Resp 20 | Ht 65.0 in | Wt 155.2 lb

## 2021-10-10 DIAGNOSIS — Z8711 Personal history of peptic ulcer disease: Secondary | ICD-10-CM | POA: Diagnosis not present

## 2021-10-10 DIAGNOSIS — I10 Essential (primary) hypertension: Secondary | ICD-10-CM | POA: Insufficient documentation

## 2021-10-10 DIAGNOSIS — Z86718 Personal history of other venous thrombosis and embolism: Secondary | ICD-10-CM | POA: Diagnosis not present

## 2021-10-10 DIAGNOSIS — C61 Malignant neoplasm of prostate: Secondary | ICD-10-CM | POA: Insufficient documentation

## 2021-10-10 DIAGNOSIS — G4730 Sleep apnea, unspecified: Secondary | ICD-10-CM | POA: Insufficient documentation

## 2021-10-10 DIAGNOSIS — M1990 Unspecified osteoarthritis, unspecified site: Secondary | ICD-10-CM | POA: Diagnosis not present

## 2021-10-10 DIAGNOSIS — I471 Supraventricular tachycardia: Secondary | ICD-10-CM | POA: Diagnosis not present

## 2021-10-10 DIAGNOSIS — K219 Gastro-esophageal reflux disease without esophagitis: Secondary | ICD-10-CM | POA: Diagnosis not present

## 2021-10-10 DIAGNOSIS — Z87442 Personal history of urinary calculi: Secondary | ICD-10-CM | POA: Insufficient documentation

## 2021-10-10 DIAGNOSIS — Z7982 Long term (current) use of aspirin: Secondary | ICD-10-CM | POA: Insufficient documentation

## 2021-10-10 DIAGNOSIS — Z79899 Other long term (current) drug therapy: Secondary | ICD-10-CM | POA: Insufficient documentation

## 2021-10-10 DIAGNOSIS — Z87891 Personal history of nicotine dependence: Secondary | ICD-10-CM | POA: Diagnosis not present

## 2021-10-10 DIAGNOSIS — R972 Elevated prostate specific antigen [PSA]: Secondary | ICD-10-CM | POA: Diagnosis not present

## 2021-10-10 DIAGNOSIS — I2510 Atherosclerotic heart disease of native coronary artery without angina pectoris: Secondary | ICD-10-CM | POA: Diagnosis not present

## 2021-10-10 HISTORY — DX: Malignant neoplasm of prostate: C61

## 2021-10-10 NOTE — Progress Notes (Signed)
Radiation Oncology         [phone] ________________________________  Initial Outpatient Consultation  Name: Harold Waters MRN: [id number]  Date: 10/10/2021  DOB: [birth year]  EU:MPNTIRWER, [name], MD  Ardis Hughs, MD   REFERRING PHYSICIAN: Ardis Hughs, MD  DIAGNOSIS: 75 y.o. gentleman with Stage T1c adenocarcinoma of the prostate with Gleason score of 3+4, and PSA of 14.5.    ICD-10-CM   1. Malignant neoplasm of prostate ([place])  C61       HISTORY OF PRESENT ILLNESS: Harold Waters is a 75 y.o. male with a diagnosis of prostate cancer. He was noted to have an elevated PSA of 10.25 by his primary care physician, Dr. Alain Marion.  Accordingly, he was referred for evaluation in urology by Dr. Louis Meckel on 03/10/21. A digital rectal examination was performed at that time revealing bilateral bogginess and tenderness so he was started on tamsulosin for suspected prostatitis.  Unfortunately, he did not experience any benefit from the Flomax and a repeat PSA on 07/22/2021 showed further increase to 14.5.  The patient proceeded to transrectal ultrasound with 12 biopsies of the prostate on 09/15/21.  The prostate volume measured 53 cc.  Out of 12 core biopsies, 6 were positive.  The maximum Gleason score was 3+4, and this was seen in the left base lateral, left mid lateral, left apex lateral (with perineural invasion), left base (with PNI), and left mid. Additionally, Gleason 3+3 was seen in the right base lateral.  A CT A/P was performed on 10/07/2021 at the patient's request to rule out any evidence of metastatic disease and this confirmed no evidence of metastatic disease.  The patient reviewed the biopsy results with his urologist and he has kindly been referred today for discussion of potential radiation treatment options.   PREVIOUS RADIATION THERAPY: No  PAST MEDICAL HISTORY:  Past Medical History:  Diagnosis Date   Angina    Chronic stomach ulcer    "get them  off and on" (09/19/2018)   Coronary atherosclerosis of unspecified type of vessel, native or graft    Diverticulosis    DVT of lower extremity (deep venous thrombosis) ([place]) ~ 2010   LLE   Erosive gastritis    Essential hypertension, benign    GERD (gastroesophageal reflux disease)    Headache(784.0) 02/18/12   "lately"   History of kidney stones    Internal hemorrhoids    Lower back pain    Myocardial infarction ([place]) 1997   Osteoarthritis    Other and unspecified hyperlipidemia    PAD (peripheral artery disease) ([place])    with ABI's 0.8 on the right and 0.86 on the left   Pneumonia 1957   PSVT (paroxysmal supraventricular tachycardia) ([place]) 02/18/12   Shortness of breath    "lying down"   Sleep apnea    does not wear c-pap; pt does not recall this hx on 09/19/2018      PAST SURGICAL HISTORY: Past Surgical History:  Procedure Laterality Date   ARTHROSCOPY KNEE W/ DRILLING Right ~ 2001   BYPASS GRAFT Left 12/05/2018   femoral popliteal    CARDIAC CATHETERIZATION  09/19/2018   CATARACT EXTRACTION     COLONOSCOPY     CORONARY ANGIOPLASTY WITH STENT PLACEMENT  1997   "2"   FEMORAL-POPLITEAL BYPASS GRAFT Left 12/06/2018   Procedure: BYPASS GRAFT FEMORAL-POPLITEAL ARTERY;  Surgeon: Waynetta Sandy, MD;  Location: [facility];  Service: Vascular;  Laterality: Left;   ILIAC ARTERY STENT  04/22/2017   .  Placement of a 6 mm x 100 mm Viabahn covered stent left SFA   INTRAVASCULAR PRESSURE WIRE/FFR STUDY  09/19/2018   INTRAVASCULAR PRESSURE WIRE/FFR STUDY N/A 09/19/2018   Procedure: INTRAVASCULAR PRESSURE WIRE/FFR STUDY;  Surgeon: [name], MD;  Location: [facility];  Service: Cardiovascular;  Laterality: N/A;   LEFT HEART CATH AND CORONARY ANGIOGRAPHY N/A 09/19/2018   Procedure: LEFT HEART CATH AND CORONARY ANGIOGRAPHY;  Surgeon: [name], MD;  Location: [place] [facility];  Service: Cardiovascular;  Laterality: N/A;   LEFT HEART CATHETERIZATION WITH  CORONARY ANGIOGRAM N/A 02/19/2012   Procedure: LEFT HEART CATHETERIZATION WITH CORONARY ANGIOGRAM;  Surgeon: Larey Dresser, MD;  Location: [facility];  Service: Cardiovascular;  Laterality: N/A;   LOWER EXTREMITY ANGIOGRAPHY Bilateral 04/22/2017   Procedure: Lower Extremity Angiography;  Surgeon: [name], MD;  Location: [place] [facility];  Service: Cardiovascular;  Laterality: Bilateral;   LOWER EXTREMITY ANGIOGRAPHY Bilateral 09/19/2018   LOWER EXTREMITY ANGIOGRAPHY Bilateral 09/19/2018   Procedure: LOWER EXTREMITY ANGIOGRAPHY;  Surgeon: [name], MD;  Location: [place] [facility];  Service: Cardiovascular;  Laterality: Bilateral;   PERIPHERAL ARTERIAL STENT GRAFT  09/2010   LLE   PERIPHERAL VASCULAR ATHERECTOMY Left 04/22/2017   Procedure: Peripheral Vascular Atherectomy;  Surgeon: [name], MD;  Location: [place] [facility];  Service: Cardiovascular;  Laterality: Left;  SFA   PERIPHERAL VASCULAR INTERVENTION Left 04/22/2017   Procedure: Peripheral Vascular Intervention;  Surgeon: [name], MD;  Location: [place] [facility];  Service: Cardiovascular;  Laterality: Left;  SFA   UPPER GASTROINTESTINAL ENDOSCOPY      FAMILY HISTORY:  Family History  Problem Relation Age of Onset   Ulcers Father        had stomach issues, not sure what happened   Coronary artery disease Brother        male 1st degree relative <50   Colon cancer Neg Hx    Esophageal cancer Neg Hx    Rectal cancer Neg Hx    Stomach cancer Neg Hx     SOCIAL HISTORY:  Social History   Socioeconomic History   Marital status: Married    Spouse name: Not on file   Number of children: 2   Years of education: Not on file   Highest education level: Not on file  Occupational History   Occupation: retired  Tobacco Use   Smoking status: Former    Packs/day: 1.00    Years: 40.00    Pack years: 40.00    Types: Cigarettes    Quit date: 04/03/2010    Years since quitting: 11.5    Smokeless tobacco: Never  Vaping Use   Vaping Use: Never used  Substance and Sexual Activity   Alcohol use: Not Currently    Comment: "used to drink when I was young; quit ~ 1980's"   Drug use: Never   Sexual activity: Yes  Other Topics Concern   Not on file  Social History Narrative   Right handed   Two story home   4 children   Social Determinants of Health   Financial Resource Strain: Low Risk    Difficulty of Paying Living Expenses: Not hard at all  Food Insecurity: Not on file  Transportation Needs: Not on file  Physical Activity: Not on file  Stress: Not on file  Social Connections: Not on file  Intimate Partner Violence: Not on file    ALLERGIES: Eggs or egg-derived products,  Aricept [donepezil], and Coreg [carvedilol]  MEDICATIONS:  Current Outpatient Medications  Medication Sig Dispense Refill   cloNIDine (CATAPRES - DOSED IN MG/24 HR) 0.1 mg/24hr patch Place 1 patch (0.1 mg total) onto the skin once a week. 4 patch 12   amLODipine (NORVASC) 10 MG tablet Take 1 tablet (10 mg total) by mouth daily. 90 tablet 1   aspirin EC 81 MG tablet Take 1 tablet (81 mg total) by mouth daily. 100 tablet 3   Blood Glucose Monitoring Suppl (ONETOUCH VERIO) w/Device KIT 1 Units by Does not apply route daily as needed. 1 kit 1   Cholecalciferol (VITAMIN D3) 50 MCG (2000 UT) capsule Take 1 capsule (2,000 Units total) by mouth daily. 100 capsule 3   cloNIDine (CATAPRES) 0.1 MG tablet Take 0.1 mg by mouth. (Patient not taking: Reported on 10/10/2021)     glucose blood (ONETOUCH VERIO) test strip Use to check blood sugar daily 50 each 11   hydrALAZINE (APRESOLINE) 25 MG tablet Take 1 tablet (25 mg total) by mouth 3 (three) times daily. 90 tablet 5   memantine (NAMENDA) 10 MG tablet TAKE 1 TABLET BY MOUTH EVERY DAY 90 tablet 0   mirtazapine (REMERON) 15 MG tablet TAKE 1/2 TABLET EVERY NIGHT (Patient not taking: Reported on 08/25/2021) 45 tablet 4   Multiple Vitamins-Minerals (MENS  MULTIVITAMIN PLUS) TABS Take 1 tablet by mouth at bedtime.      nitroGLYCERIN (NITROSTAT) 0.4 MG SL tablet Place 1 tablet under tongue every 5 mins. DO NOT USE MORE THAN 3 TABS (Patient not taking: Reported on 09/19/2021) 25 tablet 5   polyethylene glycol powder (GLYCOLAX/MIRALAX) 17 GM/SCOOP powder Take 17 g by mouth 2 (two) times daily as needed for moderate constipation. 500 g 3   rosuvastatin (CRESTOR) 40 MG tablet Take 1 tablet (40 mg total) by mouth daily. PATIENT MUST ATTEND FUTURE APPOINTMENT FOR FUTURE REFILLS 90 tablet 1   No current facility-administered medications for this encounter.    REVIEW OF SYSTEMS:  On review of systems, the patient reports that he is doing well overall. He denies any chest pain, shortness of breath, cough, fevers, chills, night sweats, unintended weight changes. He reports constipation and endorses using Miralax. He denies abdominal pain, nausea or vomiting. He denies any new musculoskeletal or joint aches or pains. His IPSS was 25, indicating severe urinary symptoms with significant obstructive urinary symptoms including hesitancy, intermittency, straining to void, incomplete bladder emptying and weak flow of stream, urgency frequency and nocturia x5. His SHIM was 11, indicating he has moderate erectile dysfunction. A complete review of systems is obtained and is otherwise negative.    PHYSICAL EXAM:  Wt Readings from Last 3 Encounters:  10/10/21 155 lb 3.2 oz (70.4 kg)  09/10/21 151 lb 3.2 oz (68.6 kg)  08/25/21 151 lb (68.5 kg)   Temp Readings from Last 3 Encounters:  10/10/21 (!) 97.5 F (36.4 C)  09/10/21 98.1 F (36.7 C) (Oral)  08/25/21 98 F (36.7 C) (Oral)   BP Readings from Last 3 Encounters:  10/10/21 (!) 166/83  09/10/21 (!) 178/82  08/25/21 (!) 184/83   Pulse Readings from Last 3 Encounters:  10/10/21 71  09/10/21 66  08/25/21 72   Pain Assessment Pain Score: 0-No pain/10  In general this is a well appearing Caucasian male in no  acute distress. He's alert and oriented x4 and appropriate throughout the examination. Cardiopulmonary assessment is negative for acute distress, and he exhibits normal effort.     KPS =  90  100 - Normal; no complaints; no evidence of disease. 90   - Able to carry on normal activity; minor signs or symptoms of disease. 80   - Normal activity with effort; some signs or symptoms of disease. 31   - Cares for self; unable to carry on normal activity or to do active work. 60   - Requires occasional assistance, but is able to care for most of his personal needs. 50   - Requires considerable assistance and frequent medical care. 35   - Disabled; requires special care and assistance. 49   - Severely disabled; hospital admission is indicated although death not imminent. 65   - Very sick; hospital admission necessary; active supportive treatment necessary. 10   - Moribund; fatal processes progressing rapidly. 0     - Dead  Karnofsky DA, Abelmann [place], Craver LS and Burchenal [facility] [phone]) The use of the nitrogen mustards in the palliative treatment of carcinoma: with particular reference to bronchogenic carcinoma Cancer 1 634-56  LABORATORY DATA:  Lab Results  Component Value Date   WBC 6.6 06/26/2021   HGB 13.4 06/26/2021   HCT 40.7 06/26/2021   MCV 86.0 06/26/2021   PLT 217.0 06/26/2021   Lab Results  Component Value Date   NA 141 06/26/2021   K 4.5 06/26/2021   CL 102 06/26/2021   CO2 32 06/26/2021   Lab Results  Component Value Date   ALT 16 06/26/2021   AST 15 06/26/2021   ALKPHOS 69 06/26/2021   BILITOT 0.7 06/26/2021     RADIOGRAPHY: SLEEP STUDY DOCUMENTS  Result Date: 09/30/2021 Ordered by an unspecified provider.     IMPRESSION/PLAN: 1. 75 y.o. gentleman with Stage T1c adenocarcinoma of the prostate with Gleason Score of 3+4, and PSA of 14.5. We discussed the patient's workup and outlined the nature of prostate cancer in this setting. The patient's T stage, Gleason's score,  and PSA put him into the favorable intermediate risk group. Accordingly, he is eligible for a variety of potential treatment options including brachytherapy, 5.5 weeks of external radiation, or prostatectomy. We discussed the available radiation techniques, and focused on the details and logistics of delivery. The patient is not felt to be an ideal candidate for brachytherapy given his severe obstructive urinary symptoms, refractory to Flomax.  Therefore, we discussed and outlined the risks, benefits, short and long-term effects associated with external beam radiotherapy and compared and contrasted these with prostatectomy. We discussed the role of SpaceOAR gel in reducing the rectal toxicity associated with radiotherapy. He was encouraged to ask questions that were answered to his stated satisfaction.  At the conclusion of our conversation, the patient is interested in moving forward with 5.5 weeks of external beam therapy. We will share our discussion with Dr. Louis Meckel and make arrangements for fiducial marker with SpaceOAR gel placement, prior to simulation, to reduce rectal toxicity from radiotherapy. The patient appears to have a good understanding of his disease and our treatment recommendations which are of curative intent and is in agreement with the stated plan.  Therefore, we will move forward with treatment planning accordingly, in anticipation of beginning IMRT in the near future.  We personally spent 60 minutes in this encounter including chart review, reviewing radiological studies, meeting face-to-face with the patient, entering orders and completing documentation.    Nicholos Johns, PA-C    Tyler Pita, MD  [facility] Direct Dial: [phone]  Fax: [phone] [email or web address]  Skype  LinkedIn   This  document serves as a record of services personally performed by Tyler Pita, MD and Freeman Caldron, PA-C. It was created on their behalf by Wilburn Mylar, a trained medical scribe. The creation of this record is based on the scribe's personal observations and the provider's statements to them. This document has been checked and approved by the attending provider.

## 2021-10-10 NOTE — Progress Notes (Signed)
Introduced myself to patient, and his daughter, as the prostate nurse navigator and discussed my role.  No barriers to care identified at this time.  He is here to discuss his radiation treatment options.  He has decided to proceed with 5.5 EBRT, and will get him coordinated for fiducial markers and SpaceOar with Dr. Carlton Adam office.  I gave him my business card and asked him to call me with questions or concerns.  Verbalized understanding.

## 2021-10-13 ENCOUNTER — Ambulatory Visit (INDEPENDENT_AMBULATORY_CARE_PROVIDER_SITE_OTHER): Payer: HMO | Admitting: Internal Medicine

## 2021-10-13 ENCOUNTER — Encounter: Payer: Self-pay | Admitting: Internal Medicine

## 2021-10-13 DIAGNOSIS — E118 Type 2 diabetes mellitus with unspecified complications: Secondary | ICD-10-CM

## 2021-10-13 DIAGNOSIS — I10 Essential (primary) hypertension: Secondary | ICD-10-CM

## 2021-10-13 LAB — COMPREHENSIVE METABOLIC PANEL
ALT: 16 U/L (ref 0–53)
AST: 16 U/L (ref 0–37)
Albumin: 4 g/dL (ref 3.5–5.2)
Alkaline Phosphatase: 72 U/L (ref 39–117)
BUN: 25 mg/dL — ABNORMAL HIGH (ref 6–23)
CO2: 30 mEq/L (ref 19–32)
Calcium: 9.1 mg/dL (ref 8.4–10.5)
Chloride: 102 mEq/L (ref 96–112)
Creatinine, Ser: 1.92 mg/dL — ABNORMAL HIGH (ref 0.40–1.50)
GFR: 33.57 mL/min — ABNORMAL LOW (ref >60.00)
Glucose, Bld: 132 mg/dL — ABNORMAL HIGH (ref 70–99)
Potassium: 4.1 mEq/L (ref 3.5–5.1)
Sodium: 139 mEq/L (ref 135–145)
Total Bilirubin: 0.4 mg/dL (ref 0.2–1.2)
Total Protein: 6.6 g/dL (ref 6.0–8.3)

## 2021-10-13 LAB — HEMOGLOBIN A1C: Hgb A1c MFr Bld: 7 % — ABNORMAL HIGH (ref 4.6–6.5)

## 2021-10-13 MED ORDER — HYDRALAZINE HCL 50 MG PO TABS: 50.0000 mg | ORAL_TABLET | Freq: Three times a day (TID) | ORAL | 11 refills | Status: DC

## 2021-10-13 NOTE — Assessment & Plan Note (Signed)
Off Metformin 

## 2021-10-13 NOTE — Progress Notes (Signed)
Subjective:  Patient ID: Harold Waters, male    DOB: Sep 11, 1946  Age: 75 y.o. MRN: [id number]  CC: Follow-up (4 week f/u)   HPI [facility] presents for uncontrolled HTN He has started Hydrazine yesterday He had a PET CT on 10/07/21 w/[facility] - starting XRT     Outpatient Medications Prior to Visit  Medication Sig Dispense Refill  . amLODipine (NORVASC) 10 MG tablet Take 1 tablet (10 mg total) by mouth daily. 90 tablet 1  . aspirin EC 81 MG tablet Take 1 tablet (81 mg total) by mouth daily. 100 tablet 3  . Blood Glucose Monitoring Suppl (ONETOUCH VERIO) w/Device KIT 1 Units by Does not apply route daily as needed. 1 kit 1  . Cholecalciferol (VITAMIN D3) 50 MCG (2000 UT) capsule Take 1 capsule (2,000 Units total) by mouth daily. 100 capsule 3  . cloNIDine (CATAPRES - DOSED IN MG/24 HR) 0.1 mg/24hr patch Place 1 patch (0.1 mg total) onto the skin once a week. 4 patch 12  . cloNIDine (CATAPRES) 0.1 MG tablet Take 0.1 mg by mouth.    [name] glucose blood (ONETOUCH VERIO) test strip Use to check blood sugar daily 50 each 11  . memantine (NAMENDA) 10 MG tablet TAKE 1 TABLET BY MOUTH EVERY DAY 90 tablet 0  . Multiple Vitamins-Minerals (MENS MULTIVITAMIN PLUS) TABS Take 1 tablet by mouth at bedtime.     . nitroGLYCERIN (NITROSTAT) 0.4 MG SL tablet Place 1 tablet under tongue every 5 mins. DO NOT USE MORE THAN 3 TABS 25 tablet 5  . polyethylene glycol powder (GLYCOLAX/MIRALAX) 17 GM/SCOOP powder Take 17 g by mouth 2 (two) times daily as needed for moderate constipation. 500 g 3  . rosuvastatin (CRESTOR) 40 MG tablet Take 1 tablet (40 mg total) by mouth daily. PATIENT MUST ATTEND FUTURE APPOINTMENT FOR FUTURE REFILLS 90 tablet 1  . mirtazapine (REMERON) 15 MG tablet TAKE 1/2 TABLET EVERY NIGHT (Patient not taking: Reported on 10/13/2021) 45 tablet 4  . hydrALAZINE (APRESOLINE) 25 MG tablet Take 1 tablet (25 mg total) by mouth 3 (three) times daily. (Patient not taking: Reported on  10/13/2021) 90 tablet 5   No facility-administered medications prior to visit.    ROS: Review of Systems  Constitutional:  Negative for appetite change, fatigue and unexpected weight change.  HENT:  Negative for congestion, nosebleeds, sneezing, sore throat and trouble swallowing.   Eyes:  Negative for itching and visual disturbance.  Respiratory:  Negative for cough.   Cardiovascular:  Negative for chest pain, palpitations and leg swelling.  Gastrointestinal:  Negative for abdominal distention, blood in stool, diarrhea and nausea.  Genitourinary:  Negative for frequency and hematuria.  Musculoskeletal:  Negative for back pain, gait problem, joint swelling and neck pain.  Skin:  Negative for rash.  Neurological:  Negative for dizziness, tremors, speech difficulty and weakness.  Psychiatric/Behavioral:  Positive for sleep disturbance. Negative for agitation, dysphoric mood and suicidal ideas. The patient is not nervous/anxious.    Objective:  BP (!) 172/80 (BP Location: Left Arm)   Pulse 71   Temp 98 F (36.7 C) (Oral)   Ht 5' 5" (1.651 m)   Wt 159 lb 9.6 oz (72.4 kg)   SpO2 97%   BMI 26.56 kg/m   BP Readings from Last 3 Encounters:  10/13/21 (!) 172/80  10/10/21 (!) 166/83  09/10/21 (!) 178/82    Wt Readings from Last 3 Encounters:  10/13/21 159 lb 9.6 oz (72.4 kg)  10/10/21  155 lb 3.2 oz (70.4 kg)  09/10/21 151 lb 3.2 oz (68.6 kg)    Physical Exam Constitutional:      General: He is not in acute distress.    Appearance: He is well-developed.     Comments: NAD  Eyes:     Conjunctiva/sclera: Conjunctivae normal.     Pupils: Pupils are equal, round, and reactive to light.  Neck:     Thyroid: No thyromegaly.     Vascular: No JVD.  Cardiovascular:     Rate and Rhythm: Normal rate and regular rhythm.     Heart sounds: Normal heart sounds. No murmur heard.   No friction rub. No gallop.  Pulmonary:     Effort: Pulmonary effort is normal. No respiratory distress.      Breath sounds: Normal breath sounds. No wheezing or rales.  Chest:     Chest wall: No tenderness.  Abdominal:     General: Bowel sounds are normal. There is no distension.     Palpations: Abdomen is soft. There is no mass.     Tenderness: There is no abdominal tenderness. There is no guarding or rebound.  Musculoskeletal:        General: No tenderness. Normal range of motion.     Cervical back: Normal range of motion.  Lymphadenopathy:     Cervical: No cervical adenopathy.  Skin:    General: Skin is warm and dry.     Findings: No rash.  Neurological:     Mental Status: He is alert and oriented to person, place, and time.     Cranial Nerves: No cranial nerve deficit.     Motor: No abnormal muscle tone.     Coordination: Coordination normal.     Gait: Gait normal.     Deep Tendon Reflexes: Reflexes are normal and symmetric.  Psychiatric:        Behavior: Behavior normal.        Thought Content: Thought content normal.        Judgment: Judgment normal.    Lab Results  Component Value Date   WBC 6.6 06/26/2021   HGB 13.4 06/26/2021   HCT 40.7 06/26/2021   PLT 217.0 06/26/2021   GLUCOSE 112 (H) 06/26/2021   CHOL 276 (H) 01/30/2021   TRIG 145.0 01/30/2021   HDL 49.30 01/30/2021   LDLDIRECT 167 (H) 10/25/2019   LDLCALC 198 (H) 01/30/2021   ALT 16 06/26/2021   AST 15 06/26/2021   NA 141 06/26/2021   K 4.5 06/26/2021   CL 102 06/26/2021   CREATININE 1.78 (H) 06/26/2021   BUN 23 06/26/2021   CO2 32 06/26/2021   TSH 5.48 (H) 01/30/2021   PSA 9.90 (H) 06/26/2021   INR 0.99 11/28/2018   HGBA1C 7.3 (H) 06/26/2021    No results found.  Assessment & Plan:   Problem List Items Addressed This Visit     CRI (chronic renal insufficiency), stage 3 (moderate) ([place])    Hydrate well Nephrology ref was made      Relevant Orders   Ambulatory referral to Nephrology   Diabetes mellitus type 2 with complications ([place])    Off Metformin      Relevant Orders   Comprehensive  metabolic panel   Hemoglobin A1c   Uncontrolled hypertension    Refractory Increase Hydrazine to 50 mg three times a day if tolerated He had a PET CT on 10/07/21 w/[facility] - starting XRT seeds Nephrology ref was made      Relevant  Medications   hydrALAZINE (APRESOLINE) 50 MG tablet   Other Relevant Orders   Ambulatory referral to Nephrology      Meds ordered this encounter  Medications  . hydrALAZINE (APRESOLINE) 50 MG tablet    Sig: Take 1 tablet (50 mg total) by mouth 3 (three) times daily.    Dispense:  90 tablet    Refill:  11      Follow-up: Return in about 6 weeks (around 11/24/2021) for a follow-up visit.  Walker Kehr, MD

## 2021-10-13 NOTE — Assessment & Plan Note (Signed)
Hydrate well Nephrology ref was made

## 2021-10-13 NOTE — Progress Notes (Signed)
Pls call daughter [name] and let her know the sleep study showed severe sleep apnea. He will be referred to a sleep specialist. The sleep specialist recommended a CPAP titration study, we can either order it before he sees the sleep specialist, if they wish. Pls refer to Pulmonary for severe OSA. Thanks

## 2021-10-13 NOTE — Patient Instructions (Signed)
Hydrate well Nephrology referral was made

## 2021-10-13 NOTE — Assessment & Plan Note (Signed)
Refractory Increase Hydrazine to 50 mg three times a day if tolerated He had a PET CT on 10/07/21 w/[facility] - starting XRT seeds Nephrology ref was made

## 2021-10-14 NOTE — Progress Notes (Signed)
Pt daughter called no answer left a voice mail to call the office back  

## 2021-10-16 ENCOUNTER — Telehealth: Payer: Self-pay | Admitting: *Deleted

## 2021-10-16 NOTE — Telephone Encounter (Signed)
CALLED PATIENT TO INFORM OF FID. MARKER AND SPACE OAR PLACEMENT ON 11-13-21 @ [facility] AND HIS SIM APPT. ON 11-17-21- ARRIVAL TIME- 8::45 AM @ [place], SPOKE WITH PATIENT'S BONNIE AND SHE IS AWARE OF THESE APPTS.

## 2021-10-20 NOTE — Progress Notes (Signed)
Pt daughter called no answer was sent to voice mail left a message to call the office back

## 2021-10-22 ENCOUNTER — Telehealth: Payer: Self-pay | Admitting: Neurology

## 2021-10-22 DIAGNOSIS — G4730 Sleep apnea, unspecified: Secondary | ICD-10-CM

## 2021-10-22 NOTE — Telephone Encounter (Signed)
Patients daughter [name] is returning a call to heather from Monday

## 2021-10-22 NOTE — Telephone Encounter (Signed)
-----   Message from Cameron Sprang, MD sent at 10/13/2021 10:42 AM EST -----    ----- Message ----- From: Deneise Lever, MD Sent: 10/04/2021  12:31 PM EST To: Cassandria Anger, MD, Cameron Sprang, MD

## 2021-10-22 NOTE — Telephone Encounter (Signed)
Pt daughter informed that the sleep study showed severe sleep apnea. He will be referred to a sleep specialist. The sleep specialist recommended a CPAP titration study, order placed in Epic

## 2021-10-31 NOTE — Progress Notes (Incomplete)
Assessment/Plan:    Vascular dementia without behavioral disturbance  Is a pleasant 75 year old right-handed man with a diagnosis of vascular dementia without behavioral disturbance.  He has had difficulties with medications, his daughter will now monitor medications. He reports poor sleep, there might be a component of sleep apnea.MMSE today is 30/30 (MMSE 26/30 in 03/2020)   Schedule home sleep study to rule out sleep apnea Restart mirtazapine 83m to 1/2 tablet every night Contact our office for an update in 2 weeks, if no problems with mirtazapine, we will plan to restart the memory medication Memantine once a day Follow-up in 6 months, call for any changes.  Discussed safety both in and out of the home.  Discussed the importance of regular daily schedule with inclusion of crossword puzzles to maintain brain function.  Continue to monitor mood. Continue to monitor driving Agree with daughter taking over medication administration Stay active at least 30 minutes at least 3 times a week.  Naps should be scheduled and should be no longer than 60 minutes and should not occur after 2 PM.   Case discussed with Dr. ADelice Leschwho agrees with the plan   Subjective:   Harold Waters a 75y.o. male with a history of uncontrolled hypertension, hyperlipidemia, PAD, history of migraine with aura, recent COVID on 03/11/2021, severe sleep apnea recently on CPAP and recent diagnosis of prostate cancer (PSA 14.5) on radiation  seen today in follow-up ofr memory loss .  This patient is accompanied in the office by his daughter who supplements the history.  Previous records as well as any outside records available were reviewed prior to todays visit.   He is on memantine 10 mg daily administered by his daughter Harold Waters  Daughter adds when he is asked a question, he takes a longer time to think about it, or is for example he is able to eat a burger, he has difficulties with "what to do with the  bread " Mood is controlled with Remeron  Daughter dates that he may be showing more sadness, and at times irritability.  She states that during the day, he is very functional, able to work (She works with him, running a cArboriculturist.  However when he makes it home, he has frequent  "bickering" with his wife.   He is still doing well with finances, and she witnesses it as she works with him.   Denies getting lost, or leaving objects in unusual places.   Appetite is good and denies trouble swallowing. He no longer cooks . He ambulates without difficulty, without the use of a walker or a cane.  He continues to drive, without getting lost. Denies headaches, trauma, or injuries to the head, double vision, dizziness, focal numbness or tingling, unilateral weakness or tremors.  Denies urine incontinence or retention.  Denies constipation or diarrhea.        History on Initial Assessment 08/23/2019: This is a pleasant 75year old right-handed man with a history of hypertension, hyperlipidemia, PAD, presenting for evaluation of memory loss. He feels that memory changes started soon after his surgery in February 2020 where he had a left femoral to AK popliteal artery bypass under general anesthesia.  Prior to this, he had minor memory changes, but soon after surgery he noticed that he was unable to do things that he could do easily in the past such as math. He used to do large numbers in the back of his head, but has more difficulty  now. He lives with his wife who constantly says something about his memory saying "you forget everything," which upsets him since this was not happening prior to the surgery. He denies getting lost driving but has missed his exit a few times because he is not focusing or forgets where he is going. He has forgotten bill payments and has had late charges (new as well). He has been forgetting his medications, his wife now helps him. He loses things constantly, his phone is lost all  the time. He has put things for the pantry in the freezer. He denies leaving the stove on. He has word-finding difficulties and forgets names more. 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 He has also had a change in sleep pattern, he has always had sleep difficulties getting an average of 4 hours of sleep, but since the surgery he would only get 2-3 hours of sleep. With Trazodone 1 tab qhs he gets 4 hours. He has daytime drowsiness and would drag his feet because he is so tired. Mood is good. No family history of dementia, no history of significant head injuries or alcohol use.   He gets dizzy when walking or changing clothes, no falls. He recalls an episode of double vision 3-4 years ago, this has resolved. He has occasional difficulty swallowing water. He has some numbness in both hands. He denies any headaches, focal numbness/tingling/weakness, bowel/bladder dysfunction, anosmia, or tremors. He denies any prior history of stroke, head CT done in 07/2019 showed an old left posterior parietal infarct and old left superior cerebellar lacunar infarcts, mild chronic microvascular disease, no acute change.     MRI brain showed mild diffuse atrophy and moderate chronic microvascular disease, chronic small strokes in the left occipital lobe, bilateral cerebellar hemispheres and thalami.      Laboratory Data:      Lab Results  Component Value Date    TSH 4.91 (H) 07/03/2019    Free T4 0.77  PREVIOUS MEDICATIONS: Aricept 10 mg daily  CURRENT MEDICATIONS:  Current Outpatient Medications:    amLODipine (NORVASC) 10 MG tablet, Take 1 tablet (10 mg total) by mouth daily., Disp: 90 tablet, Rfl: 1   aspirin EC 81 MG tablet, Take 1 tablet (81 mg total) by mouth daily., Disp: 100 tablet, Rfl: 3   Blood Glucose Monitoring Suppl (ONETOUCH VERIO) w/Device KIT, 1 Units by Does not apply  route daily as needed., Disp: 1 kit, Rfl: 1   Cholecalciferol (VITAMIN D3) 50 MCG (2000 UT) capsule, Take 1 capsule (2,000 Units total) by mouth daily., Disp: 100 capsule, Rfl: 3   cloNIDine (CATAPRES - DOSED IN MG/24 HR) 0.1 mg/24hr patch, Place 1 patch (0.1 mg total) onto the skin once a week., Disp: 4 patch, Rfl: 12   cloNIDine (CATAPRES) 0.1 MG tablet, Take 0.1 mg by mouth., Disp: , Rfl:    glucose blood (ONETOUCH VERIO) test strip, Use to check blood sugar daily, Disp: 50 each, Rfl: 11   hydrALAZINE (APRESOLINE) 50 MG tablet, Take 1 tablet (50 mg total) by mouth 3 (three) times daily., Disp: 90 tablet, Rfl: 11   memantine (NAMENDA) 10 MG tablet, TAKE 1 TABLET BY MOUTH EVERY DAY, Disp: 90 tablet, Rfl: 0   mirtazapine (REMERON) 15 MG tablet, TAKE 1/2 TABLET EVERY NIGHT (Patient not taking: Reported on 10/13/2021), Disp: 45 tablet, Rfl: 4  Multiple Vitamins-Minerals (MENS MULTIVITAMIN PLUS) TABS, Take 1 tablet by mouth at bedtime. , Disp: , Rfl:    nitroGLYCERIN (NITROSTAT) 0.4 MG SL tablet, Place 1 tablet under tongue every 5 mins. DO NOT USE MORE THAN 3 TABS, Disp: 25 tablet, Rfl: 5   polyethylene glycol powder (GLYCOLAX/MIRALAX) 17 GM/SCOOP powder, Take 17 g by mouth 2 (two) times daily as needed for moderate constipation., Disp: 500 g, Rfl: 3   rosuvastatin (CRESTOR) 40 MG tablet, Take 1 tablet (40 mg total) by mouth daily. PATIENT MUST ATTEND FUTURE APPOINTMENT FOR FUTURE REFILLS, Disp: 90 tablet, Rfl: 1     Objective:     PHYSICAL EXAMINATION:    VITALS:   There were no vitals filed for this visit.   GEN:  The patient appears stated age and is in NAD. HEENT:  Normocephalic, atraumatic.   Neurological examination:  General: NAD, well-groomed, appears stated age. Orientation: The patient is alert. Oriented to person, place and date  Cranial nerves: There is good facial symmetry.The speech is fluent and clear . No aphasia or dysarthria. Fund of knowledge is appropriate. Recent  and remote memory are intact. Attention and concentration are intact.  Able to name objects and repeat phrases.  Hearing is intact to conversational tone.    Sensation: Sensation is intact to light touch throughout Motor: Strength is at least antigravity x4.  No flowsheet data found.   MMSE - Mini Mental State Exam 05/01/2021 03/29/2020  Orientation to time 5 5  Orientation to Place 5 5  Registration 3 3  Attention/ Calculation 5 4  Recall 3 0  Language- name 2 objects 2 2  Language- repeat 1 1  Language- follow 3 step command 3 3  Language- read & follow direction 1 1  Write a sentence 1 1  Copy design 1 1  Total score 30 26      Movement examination: Tone: There is normal tone in the UE/LE Abnormal movements:  no tremor.  No myoclonus.  No asterixis.   Coordination:  There is no decremation with RAM's. Normal finger to nose  Gait and Station: The patient has no difficulty arising out of a deep-seated chair without the use of the hands. The patient's stride length is good.  Gait is cautious and narrow.     Total time spent on today's visit was 72mnutes, including both face-to-face time and nonface-to-face time. Time included that spent on review of records (prior notes available to me/labs/imaging if pertinent), discussing treatment and goals, answering patient's questions and coordinating care.  Cc:  Plotnikov, AEvie Lacks MD SSharene Butters PA-C

## 2021-11-04 ENCOUNTER — Ambulatory Visit: Payer: HMO | Admitting: Physician Assistant

## 2021-11-04 NOTE — Progress Notes (Signed)
error 

## 2021-11-05 NOTE — Progress Notes (Signed)
°  Radiation Oncology         ([phone]) [phone] ________________________________  Name: [facility] MRN: [id number]  Date: 11/17/2021  DOB: [birth year]  SIMULATION AND TREATMENT PLANNING NOTE    ICD-10-CM   1. Malignant neoplasm of prostate ([place])  C61       DIAGNOSIS:  76 y.o. gentleman with Stage T1c adenocarcinoma of the prostate with Gleason score of 3+4, and PSA of 14.5.  NARRATIVE:  The patient was brought to the [facility].  Identity was confirmed.  All relevant records and images related to the planned course of therapy were reviewed.  The patient freely provided informed written consent to proceed with treatment after reviewing the details related to the planned course of therapy. The consent form was witnessed and verified by the simulation staff.  Then, the patient was set-up in a stable reproducible supine position for radiation therapy.  A vacuum lock pillow device was custom fabricated to position his legs in a reproducible immobilized position.  Then, I performed a urethrogram under sterile conditions to identify the prostatic apex.  CT images were obtained.  Surface markings were placed.  The CT images were loaded into the planning software.  Then the prostate target and avoidance structures including the rectum, bladder, bowel and hips were contoured.  Treatment planning then occurred.  The radiation prescription was entered and confirmed.  A total of one complex treatment devices was fabricated. I have requested : Intensity Modulated Radiotherapy (IMRT) is medically necessary for this case for the following reason:  Rectal sparing.[name]  PLAN:  The patient will receive 70 Gy in 28 fractions.  ________________________________  Sheral Apley Tammi Klippel, M.D.

## 2021-11-06 NOTE — Progress Notes (Signed)
Assessment/Plan:    Vascular dementia without behavioral disturbance  Is a pleasant 75 year old right-handed man with a diagnosis of vascular dementia without behavioral disturbance.  MMSE today is 29/30, was 30 of 30 in June 2021.   Continue with plans for CPAP to help with his sleep, and hopefully to improve his cognitive status Continue Memantine 10 mg daily.Side effects were discussed  Try melatonin to help him sleep Check TSH, results to be sent to PCP  Discussed safety both in and out of the home.  Discussed the importance of regular daily schedule with inclusion of crossword puzzles to maintain brain function.  Continue to monitor mood by PCP. Continue to monitor driving Stay active at least 30 minutes at least 3 times a week.  Follow-up in 6 months with Dr. Delice Lesch Naps should be scheduled and should be no longer than 60 minutes and should not occur after 2 PM.  Recommended to try to start going to sleep at an earlier time. Follow-up in 6 months with Dr. Delice Lesch  Case discussed with Dr. Delice Lesch who agrees with the plan   Subjective:   Harold Waters is a delightful 76 y.o. RH male with a history of uncontrolled hypertension, hyperlipidemia, PAD, history of migraine with aura, severe sleep apnea with impending new CPAP fitting and a history of prostate cancer (PSA 14.5) for seed implant soon and a diagnosis of vascular dementia without behavioral disturbance, seen today in follow-up for memory loss .  This patient is accompanied in the office by his daughter who supplements the history.  Previous records as well as any outside records available were reviewed prior to todays visit.  He is on memantine 10 mg daily administered by his daughter Harold Waters. He reports mild worsening of his memory, especially with remembering names of people that he knew in the past.  This was more noticeable during the last Christmas holidays.  In another episode, he went to [facility], was in the freezer  section, initially knowing what he had to get, but when he got to that freezer, he could not remember what he needed.  He denies any hallucinations or paranoia.  He denies leaving objects in unusual places.  He does not sleep well, he is hoping that when they place his CPAP, he will be able to sleep better.  In addition, he stopped mirtazapine "because I was not sleeping well with it, was not rested not getting my 8 hours of sleep, and then I was groggy during the day ".  He is daughter is worried because "these are not enough hours, he goes to sleep at 11 PM and then wakes up at 6 AM ".  He denies any vivid dreams, REM behavior or sleepwalking.  Of note, the patient has to get up several times at night to urinate because of his prostate issues.  His mood is good, he has a friend over living with him for the last month, he is having a "great time, it helps a lot my mood ".  He is trying to convince him to live with him "forever ".  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  He is still doing well with finances, and his daughter witnesses him as she works "side-by-side ".  He continues to drive, denies getting lost.  His appetite is good, denies any trouble swallowing.  He no longer cooks.  He ambulates without difficulty, without the use of a walker or a cane, no recent  head trauma or falls.  He denies any headaches, double vision, dizziness, focal numbness or tingling, unilateral weakness or tremors.  He has issues with urination, doing so frequently especially at night.  Denies constipation or diarrhea.      History on Initial Assessment 08/23/2019: This is a pleasant 76 year old right-handed man with a history of hypertension, hyperlipidemia, PAD, presenting for evaluation of memory loss. He feels that memory changes started soon after his surgery in February 2020 where he had a left femoral to [place] popliteal artery bypass under general anesthesia.  Prior to this, he had minor  memory changes, but soon after surgery he noticed that he was unable to do things that he could do easily in the past such as math. He used to do large numbers in the back of his head, but has more difficulty now. He lives with his wife who constantly says something about his memory saying "you forget everything," which upsets him since this was not happening prior to the surgery. He denies getting lost driving but has missed his exit a few times because he is not focusing or forgets where he is going. He has forgotten bill payments and has had late charges (new as well). He has been forgetting his medications, his wife now helps him. He loses things constantly, his phone is lost all the time. He has put things for the pantry in the freezer. He denies leaving the stove on. He has word-finding difficulties and forgets names more. 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 He has also had a change in sleep pattern, he has always had sleep difficulties getting an average of 4 hours of sleep, but since the surgery he would only get 2-3 hours of sleep. With Trazodone 1 tab qhs he gets 4 hours. He has daytime drowsiness and would drag his feet because he is so tired. Mood is good. No family history of dementia, no history of significant head injuries or alcohol use.   He gets dizzy when walking or changing clothes, no falls. He recalls an episode of double vision 3-4 years ago, this has resolved. He has occasional difficulty swallowing water. He has some numbness in both hands. He denies any headaches, focal numbness/tingling/weakness, bowel/bladder dysfunction, anosmia, or tremors. He denies any prior history of stroke, head CT done in 07/2019 showed an old left posterior parietal infarct and old left superior cerebellar lacunar infarcts, mild chronic microvascular disease, no acute change.      MRI brain showed mild diffuse atrophy and moderate chronic microvascular disease, chronic small strokes in the left occipital lobe, bilateral cerebellar hemispheres and thalami.      Laboratory Data:      Lab Results  Component Value Date    TSH 4.91 (H) 07/03/2019    Free T4 0.77  PREVIOUS MEDICATIONS: Aricept 10 mg daily  CURRENT MEDICATIONS:  Current Outpatient Medications:    amLODipine (NORVASC) 10 MG tablet, Take 1 tablet (10 mg total) by mouth daily., Disp: 90 tablet, Rfl: 1   aspirin EC 81 MG tablet, Take 1 tablet (81 mg total) by mouth daily., Disp: 100 tablet, Rfl: 3   Blood Glucose Monitoring Suppl (ONETOUCH VERIO) w/Device KIT, 1 Units by Does not apply route daily as needed., Disp: 1 kit, Rfl: 1   Cholecalciferol (VITAMIN D3) 50 MCG (2000 UT) capsule, Take  1 capsule (2,000 Units total) by mouth daily., Disp: 100 capsule, Rfl: 3   cloNIDine (CATAPRES - DOSED IN MG/24 HR) 0.1 mg/24hr patch, Place 1 patch (0.1 mg total) onto the skin once a week., Disp: 4 patch, Rfl: 12   cloNIDine (CATAPRES) 0.1 MG tablet, Take 0.1 mg by mouth., Disp: , Rfl:    glucose blood (ONETOUCH VERIO) test strip, Use to check blood sugar daily, Disp: 50 each, Rfl: 11   hydrALAZINE (APRESOLINE) 50 MG tablet, Take 1 tablet (50 mg total) by mouth 3 (three) times daily., Disp: 90 tablet, Rfl: 11   Multiple Vitamins-Minerals (MENS MULTIVITAMIN PLUS) TABS, Take 1 tablet by mouth at bedtime. , Disp: , Rfl:    nitroGLYCERIN (NITROSTAT) 0.4 MG SL tablet, Place 1 tablet under tongue every 5 mins. DO NOT USE MORE THAN 3 TABS, Disp: 25 tablet, Rfl: 5   polyethylene glycol powder (GLYCOLAX/MIRALAX) 17 GM/SCOOP powder, Take 17 g by mouth 2 (two) times daily as needed for moderate constipation., Disp: 500 g, Rfl: 3   rosuvastatin (CRESTOR) 40 MG tablet, Take 1 tablet (40 mg total) by mouth daily. PATIENT MUST ATTEND FUTURE APPOINTMENT FOR FUTURE REFILLS, Disp: 90 tablet, Rfl: 1   memantine (NAMENDA) 10 MG tablet, Take 1  tablet (10 mg total) by mouth daily., Disp: 90 tablet, Rfl: 3   mirtazapine (REMERON) 15 MG tablet, TAKE 1/2 TABLET EVERY NIGHT, Disp: 45 tablet, Rfl: 4     Objective:     PHYSICAL EXAMINATION:    VITALS:   BP (!) 159/75    Pulse 77    Resp 18    Ht _0  (1.651 m)    Wt 160 lb (72.6 kg)    SpO2 98%    BMI 26.63 kg/m    GEN:  The patient appears stated age and is in NAD. HEENT:  Normocephalic, atraumatic.   Neurological examination:  General: NAD, well-groomed, appears stated age. Orientation: The patient is alert. Oriented to person, place and date  Cranial nerves: There is good facial symmetry.The speech is fluent and clear . No aphasia or dysarthria. Fund of knowledge is appropriate. Recent and remote memory are intact. Attention and concentration are intact.  Able to name objects and repeat phrases.  Hearing is intact to conversational tone.    Sensation: Sensation is intact to light touch throughout Motor: Strength is at least antigravity x4.  No flowsheet data found.   MMSE - Mini Mental State Exam 11/07/2021 05/01/2021 03/29/2020  Orientation to time _1 Orientation to Place _2 Registration _3 Attention/ Calculation _4 Recall 3 3 0  Language- name 2 objects _5 Language- repeat _6 Language- follow 3 step command _7 Language- read & follow direction _8 Write a sentence _9 Copy design 0 1 1  Total score _10 Movement examination: Tone: There is normal tone in the UE/LE Abnormal movements:  no tremor.  No myoclonus.  No asterixis.   Coordination:  There is no decremation with RAM's. Normal finger to nose  Gait and Station: The patient has no difficulty arising out of a deep-seated chair without the use of the hands. The patient's stride length is good.  Gait is cautious and narrow.     Total time spent on today's visit was 40 minutes, including both face-to-face time and nonface-to-face time. Time  included that spent on  review of records (prior notes available to me/labs/imaging if pertinent), discussing treatment and goals, answering patient's questions and coordinating care.  Cc:  Plotnikov, [name], MD Sharene Butters, PA-C

## 2021-11-07 ENCOUNTER — Other Ambulatory Visit: Payer: Self-pay

## 2021-11-07 ENCOUNTER — Ambulatory Visit: Payer: HMO | Admitting: Physician Assistant

## 2021-11-07 ENCOUNTER — Encounter: Payer: Self-pay | Admitting: Physician Assistant

## 2021-11-07 ENCOUNTER — Other Ambulatory Visit (INDEPENDENT_AMBULATORY_CARE_PROVIDER_SITE_OTHER): Payer: HMO

## 2021-11-07 VITALS — BP 159/75 | HR 77 | Resp 18 | Ht 65.0 in | Wt 160.0 lb

## 2021-11-07 DIAGNOSIS — R413 Other amnesia: Secondary | ICD-10-CM | POA: Diagnosis not present

## 2021-11-07 LAB — VITAMIN B12: Vitamin B-12: 981 pg/mL — ABNORMAL HIGH (ref 211–911)

## 2021-11-07 MED ORDER — MEMANTINE HCL 10 MG PO TABS: 10.0000 mg | ORAL_TABLET | Freq: Every day | ORAL | 3 refills | Status: DC

## 2021-11-07 NOTE — Patient Instructions (Addendum)
Follow up on the sleep apnea   2. Continue Memantine 10 mg daily.Side effects were discussed   3.Check labs   4. Follow-up in 6 months, call for any changes.    FALL PRECAUTIONS: Be cautious when walking. Scan the area for obstacles that may increase the risk of trips and falls. When getting up in the mornings, sit up at the edge of the bed for a few minutes before getting out of bed. Consider elevating the bed at the head end to avoid drop of blood pressure when getting up. Walk always in a well-lit room (use night lights in the walls). Avoid area rugs or power cords from appliances in the middle of the walkways. Use a walker or a cane if necessary and consider physical therapy for balance exercise. Get your eyesight checked regularly.  FINANCIAL OVERSIGHT: Supervision, especially oversight when making financial decisions or transactions is also recommended.  HOME SAFETY: Consider the safety of the kitchen when operating appliances like stoves, microwave oven, and blender. Consider having supervision and share cooking responsibilities until no longer able to participate in those. Accidents with firearms and other hazards in the house should be identified and addressed as well.  DRIVING: Regarding driving, in patients with progressive memory problems, driving will be impaired. We advise to have someone else do the driving if trouble finding directions or if minor accidents are reported. Independent driving assessment is available to determine safety of driving.  ABILITY TO BE LEFT ALONE: If patient is unable to contact 911 operator, consider using LifeLine, or when the need is there, arrange for someone to stay with patients. Smoking is a fire hazard, consider supervision or cessation. Risk of wandering should be assessed by caregiver and if detected at any point, supervision and safe proof recommendations should be instituted.  MEDICATION SUPERVISION: Inability to self-administer medication needs  to be constantly addressed. Implement a mechanism to ensure safe administration of the medications.  RECOMMENDATIONS FOR ALL PATIENTS WITH MEMORY PROBLEMS: 1. Continue to exercise (Recommend 30 minutes of walking everyday, or 3 hours every week) 2. Increase social interactions - continue going to [place] and enjoy social gatherings with friends and family 3. Eat healthy, avoid fried foods and eat more fruits and vegetables 4. Maintain adequate blood pressure, blood sugar, and blood cholesterol level. Reducing the risk of stroke and cardiovascular disease also helps promoting better memory. 5. Avoid stressful situations. Live a simple life and avoid aggravations. Organize your time and prepare for the next day in anticipation. 6. Sleep well, avoid any interruptions of sleep and avoid any distractions in the bedroom that may interfere with adequate sleep quality 7. Avoid sugar, avoid sweets as there is a strong link between excessive sugar intake, diabetes, and cognitive impairment The Mediterranean diet has been shown to help patients reduce the risk of progressive memory disorders and reduces cardiovascular risk. This includes eating fish, eat fruits and green leafy vegetables, nuts like almonds and hazelnuts, walnuts, and also use olive oil. Avoid fast foods and fried foods as much as possible. Avoid sweets and sugar as sugar use has been linked to worsening of memory function.  There is always a concern of gradual progression of memory problems. If this is the case, then we may need to adjust level of care according to patient needs. Support, both to the patient and caregiver, should then be put into place. Your provider has requested that you have labwork completed today. Please go to [facility] ([id number]) on the second  floor of this building before leaving the office today. You do not need to check in. If you are not called within 15 minutes please check with the front desk.

## 2021-11-08 LAB — TSH+T4F+T3FREE
Free T4: 1.26 ng/dL (ref 0.82–1.77)
T3, Free: 3 pg/mL (ref 2.0–4.4)
TSH: 3.57 u[IU]/mL (ref 0.450–4.500)

## 2021-11-13 DIAGNOSIS — C61 Malignant neoplasm of prostate: Secondary | ICD-10-CM | POA: Diagnosis not present

## 2021-11-14 ENCOUNTER — Telehealth: Payer: Self-pay | Admitting: *Deleted

## 2021-11-14 NOTE — Telephone Encounter (Signed)
CALLED PATIENT TO REMIND OF  SIM APPT. FOR 11-17-21- ARRIVAL TIME- 8:45 AM @ [facility], LVM ON DAUGHTER'S (BONNIE'S VM)

## 2021-11-15 DIAGNOSIS — I10 Essential (primary) hypertension: Secondary | ICD-10-CM | POA: Diagnosis not present

## 2021-11-15 DIAGNOSIS — R051 Acute cough: Secondary | ICD-10-CM | POA: Diagnosis not present

## 2021-11-17 ENCOUNTER — Ambulatory Visit
Admission: RE | Admit: 2021-11-17 | Discharge: 2021-11-17 | Disposition: A | Discharge status: Home / Self Care | Payer: HMO | Source: Ambulatory Visit | Attending: Radiation Oncology | Admitting: Radiation Oncology

## 2021-11-17 ENCOUNTER — Encounter: Payer: Self-pay | Admitting: Internal Medicine

## 2021-11-17 ENCOUNTER — Other Ambulatory Visit: Payer: Self-pay

## 2021-11-17 DIAGNOSIS — Z510 Encounter for antineoplastic radiation therapy: Secondary | ICD-10-CM | POA: Insufficient documentation

## 2021-11-17 DIAGNOSIS — C61 Malignant neoplasm of prostate: Secondary | ICD-10-CM | POA: Insufficient documentation

## 2021-11-18 ENCOUNTER — Telehealth (INDEPENDENT_AMBULATORY_CARE_PROVIDER_SITE_OTHER): Payer: HMO | Admitting: Family Medicine

## 2021-11-18 DIAGNOSIS — R059 Cough, unspecified: Secondary | ICD-10-CM | POA: Diagnosis not present

## 2021-11-18 DIAGNOSIS — R0981 Nasal congestion: Secondary | ICD-10-CM | POA: Diagnosis not present

## 2021-11-18 DIAGNOSIS — Z510 Encounter for antineoplastic radiation therapy: Secondary | ICD-10-CM | POA: Diagnosis not present

## 2021-11-18 DIAGNOSIS — C61 Malignant neoplasm of prostate: Secondary | ICD-10-CM | POA: Diagnosis not present

## 2021-11-18 MED ORDER — DOXYCYCLINE HYCLATE 100 MG PO TABS: 100.0000 mg | ORAL_TABLET | Freq: Two times a day (BID) | ORAL | 0 refills | Status: DC

## 2021-11-18 MED ORDER — BENZONATATE 100 MG PO CAPS: ORAL_CAPSULE | ORAL | 0 refills | Status: DC

## 2021-11-18 NOTE — Patient Instructions (Signed)
-  I sent the medication(s) we discussed to your pharmacy: Meds ordered this encounter  Medications   doxycycline (VIBRA-TABS) 100 MG tablet    Sig: Take 1 tablet (100 mg total) by mouth 2 (two) times daily.    Dispense:  14 tablet    Refill:  0   benzonatate (TESSALON PERLES) 100 MG capsule    Sig: 1-2 capsules up to twice daily as needed for cough    Dispense:  30 capsule    Refill:  0     I hope you are feeling better soon!  Seek in person care promptly if your symptoms worsen, new concerns arise or you are not improving with treatment.  It was nice to meet you today. I help [facility] out with telemedicine visits on Tuesdays and Thursdays and am happy to help if you need a virtual follow up visit on those days. Otherwise, if you have any concerns or questions following this visit please schedule a follow up visit with your Primary Care office or seek care at a local urgent care clinic to avoid delays in care

## 2021-11-18 NOTE — Progress Notes (Signed)
Virtual Visit via Video Note  I connected with Harold Waters  on 11/18/21 at 10:40 AM EST by a video enabled telemedicine application and verified that I am speaking with the correct person using two identifiers.  Location patient: [place] Location provider:work or home office Persons participating in the virtual visit: patient, provider, patient's daughter  I discussed the limitations and requested verbal permission for telemedicine visit. The patient expressed understanding and agreed to proceed.   HPI:  Acute telemedicine visit for cough: -Onset: started about week ago -covid and flu testing negative at [facility] per daughter's report and was told to take robitussin but stores are out of it -Symptoms include: cough, worsening, now coughing up yellow and green mucus and has developed chills the last few days, hurts when he coughs, had a fever the last few nights -his wife was sick too and her cough has been bad as well and now is on an antibiotic -Denies: SOB, CP other than when coughs, vomiting, diarrhea, inability to tol oral intake -Has tried: robitussin -Pertinent past medical history: see below -Pertinent medication allergies: Allergies  Allergen Reactions   Eggs Or Egg-Derived Products Itching   Aricept [Donepezil]     n/v   Coreg [Carvedilol]     fatigue  -COVID-19 vaccine status:  has had 2 doses and a booster Immunization History  Administered Date(s) Administered   Fluad Quad(high Dose 65+) 09/05/2019   Hepatitis B 09/01/2007, 10/18/2007, 04/23/2008   Influenza Whole 08/20/2008   Pneumococcal Conjugate-13 06/22/2016   Pneumococcal Polysaccharide-23 12/19/2014   Td 12/19/2014     ROS: See pertinent positives and negatives per HPI.  Past Medical History:  Diagnosis Date   Angina    Chronic stomach ulcer    "get them off and on" (09/19/2018)   Coronary atherosclerosis of unspecified type of vessel, native or graft    Diverticulosis    DVT of lower extremity (deep venous  thrombosis) ([place]) ~ 2010   LLE   Erosive gastritis    Essential hypertension, benign    GERD (gastroesophageal reflux disease)    Headache(784.0) 02/18/12   "lately"   History of kidney stones    Internal hemorrhoids    Lower back pain    Myocardial infarction ([place]) 1997   Osteoarthritis    Other and unspecified hyperlipidemia    PAD (peripheral artery disease) ([facility])    with ABI's 0.8 on the right and 0.86 on the left   Pneumonia 1957   PSVT (paroxysmal supraventricular tachycardia) ([place]) 02/18/12   Shortness of breath    "lying down"   Sleep apnea    does not wear c-pap; pt does not recall this hx on 09/19/2018    Past Surgical History:  Procedure Laterality Date   ARTHROSCOPY KNEE W/ DRILLING Right ~ 2001   BYPASS GRAFT Left 12/05/2018   femoral popliteal    CARDIAC CATHETERIZATION  09/19/2018   CATARACT EXTRACTION     COLONOSCOPY     CORONARY ANGIOPLASTY WITH STENT PLACEMENT  1997   "2"   FEMORAL-POPLITEAL BYPASS GRAFT Left 12/06/2018   Procedure: BYPASS GRAFT FEMORAL-POPLITEAL ARTERY;  Surgeon: Waynetta Sandy, MD;  Location: [facility];  Service: Vascular;  Laterality: Left;   ILIAC ARTERY STENT  04/22/2017   . Placement of a 6 mm x 100 mm Viabahn covered stent left SFA   INTRAVASCULAR PRESSURE WIRE/FFR STUDY  09/19/2018   INTRAVASCULAR PRESSURE WIRE/FFR STUDY N/A 09/19/2018   Procedure: INTRAVASCULAR PRESSURE WIRE/FFR STUDY;  Surgeon: [name], MD;  Location: [place] [facility];  Service: Cardiovascular;  Laterality: N/A;   LEFT HEART CATH AND CORONARY ANGIOGRAPHY N/A 09/19/2018   Procedure: LEFT HEART CATH AND CORONARY ANGIOGRAPHY;  Surgeon: [name], MD;  Location: [place] [facility];  Service: Cardiovascular;  Laterality: N/A;   LEFT HEART CATHETERIZATION WITH CORONARY ANGIOGRAM N/A 02/19/2012   Procedure: LEFT HEART CATHETERIZATION WITH CORONARY ANGIOGRAM;  Surgeon: Larey Dresser, MD;  Location: [facility] C[facility];  Service: Cardiovascular;   Laterality: N/A;   LOWER EXTREMITY ANGIOGRAPHY Bilateral 04/22/2017   Procedure: Lower Extremity Angiography;  Surgeon: [name], MD;  Location: [place] [facility];  Service: Cardiovascular;  Laterality: Bilateral;   LOWER EXTREMITY ANGIOGRAPHY Bilateral 09/19/2018   LOWER EXTREMITY ANGIOGRAPHY Bilateral 09/19/2018   Procedure: LOWER EXTREMITY ANGIOGRAPHY;  Surgeon: [name], MD;  Location: [place] CV [facility];  Service: Cardiovascular;  Laterality: Bilateral;   PERIPHERAL ARTERIAL STENT GRAFT  09/2010   LLE   PERIPHERAL VASCULAR ATHERECTOMY Left 04/22/2017   Procedure: Peripheral Vascular Atherectomy;  Surgeon: [name], MD;  Location: [place] [facility];  Service: Cardiovascular;  Laterality: Left;  SFA   PERIPHERAL VASCULAR INTERVENTION Left 04/22/2017   Procedure: Peripheral Vascular Intervention;  Surgeon: [name], MD;  Location: [place] CV [facility];  Service: Cardiovascular;  Laterality: Left;  SFA   UPPER GASTROINTESTINAL ENDOSCOPY       Current Outpatient Medications:    amLODipine (NORVASC) 10 MG tablet, Take 1 tablet (10 mg total) by mouth daily., Disp: 90 tablet, Rfl: 1   aspirin EC 81 MG tablet, Take 1 tablet (81 mg total) by mouth daily., Disp: 100 tablet, Rfl: 3   benzonatate (TESSALON PERLES) 100 MG capsule, 1-2 capsules up to twice daily as needed for cough, Disp: 30 capsule, Rfl: 0   Blood Glucose Monitoring Suppl (ONETOUCH VERIO) w/Device KIT, 1 Units by Does not apply route daily as needed., Disp: 1 kit, Rfl: 1   Cholecalciferol (VITAMIN D3) 50 MCG (2000 UT) capsule, Take 1 capsule (2,000 Units total) by mouth daily., Disp: 100 capsule, Rfl: 3   cloNIDine (CATAPRES - DOSED IN MG/24 HR) 0.1 mg/24hr patch, Place 1 patch (0.1 mg total) onto the skin once a week., Disp: 4 patch, Rfl: 12   cloNIDine (CATAPRES) 0.1 MG tablet, Take 0.1 mg by mouth., Disp: , Rfl:    doxycycline (VIBRA-TABS) 100 MG tablet, Take 1 tablet (100 mg total) by mouth 2  (two) times daily., Disp: 14 tablet, Rfl: 0   glucose blood (ONETOUCH VERIO) test strip, Use to check blood sugar daily, Disp: 50 each, Rfl: 11   hydrALAZINE (APRESOLINE) 50 MG tablet, Take 1 tablet (50 mg total) by mouth 3 (three) times daily., Disp: 90 tablet, Rfl: 11   memantine (NAMENDA) 10 MG tablet, Take 1 tablet (10 mg total) by mouth daily., Disp: 90 tablet, Rfl: 3   mirtazapine (REMERON) 15 MG tablet, TAKE 1/2 TABLET EVERY NIGHT, Disp: 45 tablet, Rfl: 4   Multiple Vitamins-Minerals (MENS MULTIVITAMIN PLUS) TABS, Take 1 tablet by mouth at bedtime. , Disp: , Rfl:    nitroGLYCERIN (NITROSTAT) 0.4 MG SL tablet, Place 1 tablet under tongue every 5 mins. DO NOT USE MORE THAN 3 TABS, Disp: 25 tablet, Rfl: 5   polyethylene glycol powder (GLYCOLAX/MIRALAX) 17 GM/SCOOP powder, Take 17 g by mouth 2 (two) times daily as needed for moderate constipation., Disp: 500 g, Rfl: 3   rosuvastatin (CRESTOR) 40 MG tablet, Take 1 tablet (40 mg total)  by mouth daily. PATIENT MUST ATTEND FUTURE APPOINTMENT FOR FUTURE REFILLS, Disp: 90 tablet, Rfl: 1  EXAM:  VITALS per patient if applicable:  GENERAL: alert, oriented, appears well and in no acute distress  HEENT: atraumatic, conjunttiva clear, no obvious abnormalities on inspection of external nose and ears  NECK: normal movements of the head and neck  LUNGS: on inspection no signs of respiratory distress, breathing rate appears normal, no obvious gross SOB, gasping or wheezing  CV: no obvious cyanosis  MS: moves all visible extremities without noticeable abnormality  PSYCH/NEURO: pleasant and cooperative, no obvious depression or anxiety, speech and thought processing grossly intact  ASSESSMENT AND PLAN:  Discussed the following assessment and plan:  Cough, unspecified type  Nasal congestion  -we discussed possible serious and likely etiologies, options for evaluation and workup, limitations of telemedicine visit vs in person visit, treatment,  treatment risks and precautions. Pt is agreeable to treatment via telemedicine at this moment. Query VURI, covid with false neg testing, devoloping CAP or bacterial resp illness vs other. HAs opted for repeat covid testing, nasal saline, steam, salt water gargles, Tessalon for cough with addition of doxy 161m bid x 7 days.   Advised to seek prompt virtual visit or in person care if worsening, new symptoms arise, or if is not improving with treatment as expected per our conversation of expected course. Discussed options for follow up care. Did let this patient know that I do telemedicine on Tuesdays and Thursdays for [facility] and those are the days I am logged into the system. Advised to schedule follow up visit with PCP, [place] virtual visits or [facility] if any further questions or concerns to avoid delays in care.   I discussed the assessment and treatment plan with the patient. The patient was provided an opportunity to ask questions and all were answered. The patient agreed with the plan and demonstrated an understanding of the instructions.     HLucretia Kern DO

## 2021-11-20 ENCOUNTER — Encounter: Payer: Self-pay | Admitting: Pulmonary Disease

## 2021-11-20 ENCOUNTER — Ambulatory Visit (INDEPENDENT_AMBULATORY_CARE_PROVIDER_SITE_OTHER): Payer: HMO | Admitting: Pulmonary Disease

## 2021-11-20 ENCOUNTER — Other Ambulatory Visit: Payer: Self-pay

## 2021-11-20 VITALS — BP 126/64 | HR 81 | Temp 98.6°F | Ht 65.0 in | Wt 155.6 lb

## 2021-11-20 DIAGNOSIS — G4733 Obstructive sleep apnea (adult) (pediatric): Secondary | ICD-10-CM | POA: Diagnosis not present

## 2021-11-20 DIAGNOSIS — C61 Malignant neoplasm of prostate: Secondary | ICD-10-CM | POA: Diagnosis not present

## 2021-11-20 DIAGNOSIS — J189 Pneumonia, unspecified organism: Secondary | ICD-10-CM | POA: Diagnosis not present

## 2021-11-20 DIAGNOSIS — I10 Essential (primary) hypertension: Secondary | ICD-10-CM | POA: Diagnosis not present

## 2021-11-20 NOTE — Progress Notes (Signed)
Harold Waters    [id number]    Aug 14, 1946  Primary Care Physician:Plotnikov, [name], MD  Referring Physician: Cameron Sprang, MD [address] [place],  [place] [id number]  Chief complaint:   Patient being seen for obstructive sleep apnea  HPI:  Accompanied by his daughter today for his visit  Diagnosed with obstructive sleep apnea following being seen by his primary care doctor, ordered a sleep study Sleep study does show severe obstructive sleep apnea with apnea-hypopnea index of 39-11/22/2022  History of snoring, no witnessed apneas Chronic history of daytime sleepiness, fatigue Usually goes to bed about 10 PM, falls asleep easily Up to 5 awakenings Final wake up time about 5 AM  Has lost about 25 pounds recently  Admits to dryness of his mouth in the mornings Admits to night sweats Admits to morning headaches Known to snore Memory is fair to poor  Does not recollect with apparent snoring  Reformed smoker quit about 20 years ago  Is had a history of coronary artery disease with stent placement, heart attack about 25 years ago History of hypercholesterolemia, history of CVA, history of prostate cancer  Currently being treated with a course of antibiotics for respiratory tract infection  Outpatient Encounter Medications as of 11/20/2021  Medication Sig   amLODipine (NORVASC) 10 MG tablet Take 1 tablet (10 mg total) by mouth daily.   aspirin EC 81 MG tablet Take 1 tablet (81 mg total) by mouth daily.   benzonatate (TESSALON PERLES) 100 MG capsule 1-2 capsules up to twice daily as needed for cough   Blood Glucose Monitoring Suppl (ONETOUCH VERIO) w/Device KIT 1 Units by Does not apply route daily as needed.   Cholecalciferol (VITAMIN D3) 50 MCG (2000 UT) capsule Take 1 capsule (2,000 Units total) by mouth daily.   cloNIDine (CATAPRES - DOSED IN MG/24 HR) 0.1 mg/24hr patch Place 1 patch (0.1 mg total) onto the skin once a week.   cloNIDine  (CATAPRES) 0.1 MG tablet Take 0.1 mg by mouth.   doxycycline (VIBRA-TABS) 100 MG tablet Take 1 tablet (100 mg total) by mouth 2 (two) times daily.   glucose blood (ONETOUCH VERIO) test strip Use to check blood sugar daily   hydrALAZINE (APRESOLINE) 50 MG tablet Take 1 tablet (50 mg total) by mouth 3 (three) times daily.   memantine (NAMENDA) 10 MG tablet Take 1 tablet (10 mg total) by mouth daily.   mirtazapine (REMERON) 15 MG tablet TAKE 1/2 TABLET EVERY NIGHT   Multiple Vitamins-Minerals (MENS MULTIVITAMIN PLUS) TABS Take 1 tablet by mouth at bedtime.    nitroGLYCERIN (NITROSTAT) 0.4 MG SL tablet Place 1 tablet under tongue every 5 mins. DO NOT USE MORE THAN 3 TABS   polyethylene glycol powder (GLYCOLAX/MIRALAX) 17 GM/SCOOP powder Take 17 g by mouth 2 (two) times daily as needed for moderate constipation.   rosuvastatin (CRESTOR) 40 MG tablet Take 1 tablet (40 mg total) by mouth daily. PATIENT MUST ATTEND FUTURE APPOINTMENT FOR FUTURE REFILLS   No facility-administered encounter medications on file as of 11/20/2021.    Allergies as of 11/20/2021 - Review Complete 11/20/2021  Allergen Reaction Noted   Eggs or egg-derived products Itching 11/23/2019   Aricept [donepezil] Nausea And Vomiting 05/22/2020   Coreg [carvedilol] Other (See Comments) 09/05/2019    Past Medical History:  Diagnosis Date   Angina    Chronic stomach ulcer    "get them off and on" (09/19/2018)  Coronary atherosclerosis of unspecified type of vessel, native or graft    Diverticulosis    DVT of lower extremity (deep venous thrombosis) ([place]) ~ 2010   LLE   Erosive gastritis    Essential hypertension, benign    GERD (gastroesophageal reflux disease)    Headache(784.0) 02/18/12   "lately"   History of kidney stones    Internal hemorrhoids    Lower back pain    Myocardial infarction [facility]) 1997   Osteoarthritis    Other and unspecified hyperlipidemia    PAD (peripheral artery disease) ([place])    with ABI's 0.8 on  the right and 0.86 on the left   Pneumonia 1957   PSVT (paroxysmal supraventricular tachycardia) ([place]) 02/18/12   Shortness of breath    "lying down"   Sleep apnea    does not wear c-pap; pt does not recall this hx on 09/19/2018    Past Surgical History:  Procedure Laterality Date   ARTHROSCOPY KNEE W/ DRILLING Right ~ 2001   BYPASS GRAFT Left 12/05/2018   femoral popliteal    CARDIAC CATHETERIZATION  09/19/2018   CATARACT EXTRACTION     COLONOSCOPY     CORONARY ANGIOPLASTY WITH STENT PLACEMENT  1997   "2"   FEMORAL-POPLITEAL BYPASS GRAFT Left 12/06/2018   Procedure: BYPASS GRAFT FEMORAL-POPLITEAL ARTERY;  Surgeon: Waynetta Sandy, MD;  Location: [place];  Service: Vascular;  Laterality: Left;   ILIAC ARTERY STENT  04/22/2017   . Placement of a 6 mm x 100 mm Viabahn covered stent left SFA   INTRAVASCULAR PRESSURE WIRE/FFR STUDY  09/19/2018   INTRAVASCULAR PRESSURE WIRE/FFR STUDY N/A 09/19/2018   Procedure: INTRAVASCULAR PRESSURE WIRE/FFR STUDY;  Surgeon: [name], MD;  Location: [place] [facility];  Service: Cardiovascular;  Laterality: N/A;   LEFT HEART CATH AND CORONARY ANGIOGRAPHY N/A 09/19/2018   Procedure: LEFT HEART CATH AND CORONARY ANGIOGRAPHY;  Surgeon: [name], MD;  Location: [place] [facility];  Service: Cardiovascular;  Laterality: N/A;   LEFT HEART CATHETERIZATION WITH CORONARY ANGIOGRAM N/A 02/19/2012   Procedure: LEFT HEART CATHETERIZATION WITH CORONARY ANGIOGRAM;  Surgeon: Larey Dresser, MD;  Location: [facility] C[facility] LAB;  Service: Cardiovascular;  Laterality: N/A;   LOWER EXTREMITY ANGIOGRAPHY Bilateral 04/22/2017   Procedure: Lower Extremity Angiography;  Surgeon: [name], MD;  Location: [place] [facility];  Service: Cardiovascular;  Laterality: Bilateral;   LOWER EXTREMITY ANGIOGRAPHY Bilateral 09/19/2018   LOWER EXTREMITY ANGIOGRAPHY Bilateral 09/19/2018   Procedure: LOWER EXTREMITY ANGIOGRAPHY;  Surgeon: [name], MD;   Location: [place] [facility];  Service: Cardiovascular;  Laterality: Bilateral;   PERIPHERAL ARTERIAL STENT GRAFT  09/2010   LLE   PERIPHERAL VASCULAR ATHERECTOMY Left 04/22/2017   Procedure: Peripheral Vascular Atherectomy;  Surgeon: [name], MD;  Location: [place] [facility];  Service: Cardiovascular;  Laterality: Left;  SFA   PERIPHERAL VASCULAR INTERVENTION Left 04/22/2017   Procedure: Peripheral Vascular Intervention;  Surgeon: [name], MD;  Location: [place] [facility];  Service: Cardiovascular;  Laterality: Left;  SFA   UPPER GASTROINTESTINAL ENDOSCOPY      Family History  Problem Relation Age of Onset   Ulcers Father        had stomach issues, not sure what happened   Coronary artery disease Brother        male 1st degree relative <50   Colon cancer Neg Hx    Esophageal cancer Neg Hx    Rectal cancer Neg Hx  Stomach cancer Neg Hx     Social History   Socioeconomic History   Marital status: Married    Spouse name: Not on file   Number of children: 2   Years of education: Not on file   Highest education level: Not on file  Occupational History   Occupation: retired  Tobacco Use   Smoking status: Former    Packs/day: 1.00    Years: 40.00    Pack years: 40.00    Types: Cigarettes    Quit date: 04/03/2010    Years since quitting: 11.6   Smokeless tobacco: Never  Vaping Use   Vaping Use: Never used  Substance and Sexual Activity   Alcohol use: Not Currently    Comment: "used to drink when I was young; quit ~ 1980's"   Drug use: Never   Sexual activity: Yes  Other Topics Concern   Not on file  Social History Narrative   Right handed   Two story home   4 children   Social Determinants of Health   Financial Resource Strain: Low Risk    Difficulty of Paying Living Expenses: Not hard at all  Food Insecurity: Not on file  Transportation Needs: Not on file  Physical Activity: Not on file  Stress: Not on file  Social Connections: Not on  file  Intimate Partner Violence: Not on file    Review of Systems  Constitutional:  Positive for fatigue.  Respiratory:  Positive for apnea.   Psychiatric/Behavioral:  Positive for sleep disturbance.    Vitals:   11/20/21 1113  BP: 126/64  Pulse: 81  Temp: 98.6 F (37 C)  SpO2: 93%     Physical Exam Constitutional:      Appearance: Normal appearance.  HENT:     Head: Normocephalic.     Mouth/Throat:     Mouth: Mucous membranes are moist.     Comments: Crowded oropharynx, Mallampati 4, macroglossia Eyes:     Pupils: Pupils are equal, round, and reactive to light.  Cardiovascular:     Rate and Rhythm: Normal rate and regular rhythm.     Heart sounds: No murmur heard.   No friction rub.  Pulmonary:     Effort: No respiratory distress.     Breath sounds: No stridor. No wheezing or rhonchi.  Musculoskeletal:     Cervical back: Normal range of motion. No rigidity or tenderness.  Neurological:     Mental Status: He is alert.  Psychiatric:        Mood and Affect: Mood normal.   Results of the Epworth flowsheet 11/20/2021  Sitting and reading 1  Watching TV 2  Sitting, inactive in a public place (e.g. a theatre or a meeting) 0  As a passenger in a car for an hour without a break 2  Lying down to rest in the afternoon when circumstances permit 2  Sitting and talking to someone 2  Sitting quietly after a lunch without alcohol 1  In a car, while stopped for a few minutes in traffic 1  Total score 11   Data Reviewed: Sleep study result reviewed showing AHI of 39-study performed 09/23/2021  Assessment:  Severe obstructive sleep apnea -Study confirms presence of severe obstructive sleep apnea on 11/22 -Has significant daytime symptoms relating to obstructive sleep apnea  Excessive daytime sleepiness  History of coronary artery disease  Pathophysiology of sleep disordered breathing discussed with the patient Treatment options for sleep disordered breathing discussed  with the patient  Bronchitis -Currently  on doxycycline -Has a follow-up chest x-ray ordered for today  Plan/Recommendations: DME referral for initiation of CPAP therapy for obstructive sleep apnea  Auto titrating CPAP with a setting of 5-20 will be appropriate  Close clinical follow-up for optimization of treatment  Will have patient scheduled for follow-up about a month following initiation of CPAP therapy      Sherrilyn Rist MD [facility] 11/20/2021, 11:44 AM  CC: Cameron Sprang, MD

## 2021-11-20 NOTE — Patient Instructions (Signed)
We will contact the medical supply company to set you up with a CPAP  Auto CPAP with settings of 5-20 with heated humidification  Follow-up about a month after initiating CPAP  Tentative follow-up in 3 to 4 months  Continue and complete your course of antibiotics  Call us with significant concerns

## 2021-11-20 NOTE — Telephone Encounter (Signed)
Patient daughter [name] calling in  [place] she has taken patient to urgent care & has also had VV w/ Dr. Maudie Mercury due to patient coughing & complaining of back pain when coughing.[name] [name] thinks patient may have pneumonia).. the tessalon pearls patient was given for cough has not helped  Patient is due to start radiation next week & would like to start feeling well before then.. requesting recommendations or OV from provider if possible  CB # [phone]

## 2021-11-26 ENCOUNTER — Ambulatory Visit: Payer: HMO | Admitting: Internal Medicine

## 2021-11-26 ENCOUNTER — Other Ambulatory Visit: Payer: Self-pay

## 2021-11-26 ENCOUNTER — Ambulatory Visit
Admission: RE | Admit: 2021-11-26 | Discharge: 2021-11-26 | Disposition: A | Discharge status: Home / Self Care | Payer: HMO | Source: Ambulatory Visit | Attending: Radiation Oncology | Admitting: Radiation Oncology

## 2021-11-26 DIAGNOSIS — C61 Malignant neoplasm of prostate: Secondary | ICD-10-CM | POA: Diagnosis not present

## 2021-11-26 DIAGNOSIS — Z510 Encounter for antineoplastic radiation therapy: Secondary | ICD-10-CM | POA: Diagnosis not present

## 2021-11-27 ENCOUNTER — Ambulatory Visit
Admission: RE | Admit: 2021-11-27 | Discharge: 2021-11-27 | Disposition: A | Discharge status: Home / Self Care | Payer: HMO | Source: Ambulatory Visit | Attending: Radiation Oncology | Admitting: Radiation Oncology

## 2021-11-27 DIAGNOSIS — C61 Malignant neoplasm of prostate: Secondary | ICD-10-CM | POA: Diagnosis not present

## 2021-11-27 DIAGNOSIS — Z510 Encounter for antineoplastic radiation therapy: Secondary | ICD-10-CM | POA: Diagnosis not present

## 2021-11-28 ENCOUNTER — Ambulatory Visit
Admission: RE | Admit: 2021-11-28 | Discharge: 2021-11-28 | Disposition: A | Discharge status: Home / Self Care | Payer: HMO | Source: Ambulatory Visit | Attending: Radiation Oncology | Admitting: Radiation Oncology

## 2021-11-28 DIAGNOSIS — Z510 Encounter for antineoplastic radiation therapy: Secondary | ICD-10-CM | POA: Diagnosis not present

## 2021-11-28 DIAGNOSIS — C61 Malignant neoplasm of prostate: Secondary | ICD-10-CM | POA: Diagnosis not present

## 2021-12-01 ENCOUNTER — Other Ambulatory Visit: Payer: Self-pay

## 2021-12-01 ENCOUNTER — Encounter: Payer: Self-pay | Admitting: Internal Medicine

## 2021-12-01 ENCOUNTER — Ambulatory Visit (INDEPENDENT_AMBULATORY_CARE_PROVIDER_SITE_OTHER): Payer: HMO | Admitting: Internal Medicine

## 2021-12-01 ENCOUNTER — Ambulatory Visit
Admission: RE | Admit: 2021-12-01 | Discharge: 2021-12-01 | Disposition: A | Discharge status: Home / Self Care | Payer: HMO | Source: Ambulatory Visit | Attending: Radiation Oncology | Admitting: Radiation Oncology

## 2021-12-01 VITALS — BP 152/70 | HR 74 | Temp 98.0°F | Ht 65.0 in | Wt 151.2 lb

## 2021-12-01 DIAGNOSIS — N183 Chronic kidney disease, stage 3 unspecified: Secondary | ICD-10-CM

## 2021-12-01 DIAGNOSIS — E118 Type 2 diabetes mellitus with unspecified complications: Secondary | ICD-10-CM

## 2021-12-01 DIAGNOSIS — J189 Pneumonia, unspecified organism: Secondary | ICD-10-CM

## 2021-12-01 DIAGNOSIS — C61 Malignant neoplasm of prostate: Secondary | ICD-10-CM

## 2021-12-01 DIAGNOSIS — R413 Other amnesia: Secondary | ICD-10-CM | POA: Diagnosis not present

## 2021-12-01 DIAGNOSIS — I10 Essential (primary) hypertension: Secondary | ICD-10-CM

## 2021-12-01 DIAGNOSIS — Z510 Encounter for antineoplastic radiation therapy: Secondary | ICD-10-CM | POA: Diagnosis not present

## 2021-12-01 DIAGNOSIS — D485 Neoplasm of uncertain behavior of skin: Secondary | ICD-10-CM | POA: Diagnosis not present

## 2021-12-01 MED ORDER — HYDRALAZINE HCL 100 MG PO TABS: 100.0000 mg | ORAL_TABLET | Freq: Three times a day (TID) | ORAL | 11 refills | Status: DC

## 2021-12-01 NOTE — Assessment & Plan Note (Signed)
Monitor GFR Hydrate well 

## 2021-12-01 NOTE — Progress Notes (Signed)
Subjective:  Patient ID: Harold Waters, male    DOB: [birth year]  Age: 76 y.o. MRN: [id number]  CC: Follow-up (Pt states he had pneumonia in both lungs)   HPI [facility] presents for prostate cancer - on XRT F/u on CAP - better (was seen at [facility], had a CXR) F/u HTN. SBP 150-180 at home   Outpatient Medications Prior to Visit  Medication Sig Dispense Refill   amLODipine (NORVASC) 10 MG tablet Take 1 tablet (10 mg total) by mouth daily. 90 tablet 1   aspirin EC 81 MG tablet Take 1 tablet (81 mg total) by mouth daily. 100 tablet 3   benzonatate (TESSALON PERLES) 100 MG capsule 1-2 capsules up to twice daily as needed for cough 30 capsule 0   Blood Glucose Monitoring Suppl (ONETOUCH VERIO) w/Device KIT 1 Units by Does not apply route daily as needed. 1 kit 1   Cholecalciferol (VITAMIN D3) 50 MCG (2000 UT) capsule Take 1 capsule (2,000 Units total) by mouth daily. 100 capsule 3   cloNIDine (CATAPRES - DOSED IN MG/24 HR) 0.1 mg/24hr patch Place 1 patch (0.1 mg total) onto the skin once a week. 4 patch 12   cloNIDine (CATAPRES) 0.1 MG tablet Take 0.1 mg by mouth.     glucose blood (ONETOUCH VERIO) test strip Use to check blood sugar daily 50 each 11   memantine (NAMENDA) 10 MG tablet Take 1 tablet (10 mg total) by mouth daily. 90 tablet 3   mirtazapine (REMERON) 15 MG tablet TAKE 1/2 TABLET EVERY NIGHT 45 tablet 4   Multiple Vitamins-Minerals (MENS MULTIVITAMIN PLUS) TABS Take 1 tablet by mouth at bedtime.      nitroGLYCERIN (NITROSTAT) 0.4 MG SL tablet Place 1 tablet under tongue every 5 mins. DO NOT USE MORE THAN 3 TABS 25 tablet 5   polyethylene glycol powder (GLYCOLAX/MIRALAX) 17 GM/SCOOP powder Take 17 g by mouth 2 (two) times daily as needed for moderate constipation. 500 g 3   rosuvastatin (CRESTOR) 40 MG tablet Take 1 tablet (40 mg total) by mouth daily. PATIENT MUST ATTEND FUTURE APPOINTMENT FOR FUTURE REFILLS 90 tablet 1   hydrALAZINE (APRESOLINE) 50 MG tablet Take  1 tablet (50 mg total) by mouth 3 (three) times daily. 90 tablet 11   doxycycline (VIBRA-TABS) 100 MG tablet Take 1 tablet (100 mg total) by mouth 2 (two) times daily. (Patient not taking: Reported on 12/01/2021) 14 tablet 0   No facility-administered medications prior to visit.    ROS: Review of Systems  Constitutional:  Negative for appetite change, fatigue and unexpected weight change.  HENT:  Negative for congestion, nosebleeds, sneezing, sore throat and trouble swallowing.   Eyes:  Negative for itching and visual disturbance.  Respiratory:  Negative for cough.   Cardiovascular:  Negative for chest pain, palpitations and leg swelling.  Gastrointestinal:  Negative for abdominal distention, blood in stool, diarrhea and nausea.  Genitourinary:  Negative for frequency and hematuria.  Musculoskeletal:  Positive for arthralgias. Negative for back pain, gait problem, joint swelling and neck pain.  Skin:  Negative for rash.  Neurological:  Negative for dizziness, tremors, speech difficulty and weakness.  Psychiatric/Behavioral:  Negative for agitation, dysphoric mood, sleep disturbance and suicidal ideas. The patient is not nervous/anxious.    Objective:  BP (!) 152/70 (BP Location: Left Leg)    Pulse 74    Temp 98 F (36.7 C) (Oral)    Ht _0  (1.651 m)    Wt 151 lb 3.2  oz (68.6 kg)    SpO2 97%    BMI 25.16 kg/m   BP Readings from Last 3 Encounters:  12/01/21 (!) 152/70  11/20/21 126/64  11/07/21 (!) 159/75    Wt Readings from Last 3 Encounters:  12/01/21 151 lb 3.2 oz (68.6 kg)  11/20/21 155 lb 9.6 oz (70.6 kg)  11/07/21 160 lb (72.6 kg)    Physical Exam Constitutional:      General: He is not in acute distress.    Appearance: He is well-developed.     Comments: NAD  Eyes:     Conjunctiva/sclera: Conjunctivae normal.     Pupils: Pupils are equal, round, and reactive to light.  Neck:     Thyroid: No thyromegaly.     Vascular: No JVD.  Cardiovascular:     Rate and Rhythm:  Normal rate and regular rhythm.     Heart sounds: Normal heart sounds. No murmur heard.   No friction rub. No gallop.  Pulmonary:     Effort: Pulmonary effort is normal. No respiratory distress.     Breath sounds: Normal breath sounds. No wheezing or rales.  Chest:     Chest wall: No tenderness.  Abdominal:     General: Bowel sounds are normal. There is no distension.     Palpations: Abdomen is soft. There is no mass.     Tenderness: There is no abdominal tenderness. There is no guarding or rebound.  Musculoskeletal:        General: No tenderness. Normal range of motion.     Cervical back: Normal range of motion.  Lymphadenopathy:     Cervical: No cervical adenopathy.  Skin:    General: Skin is warm and dry.     Findings: No rash.  Neurological:     Mental Status: He is alert and oriented to person, place, and time.     Cranial Nerves: No cranial nerve deficit.     Motor: No abnormal muscle tone.     Coordination: Coordination normal.     Gait: Gait normal.     Deep Tendon Reflexes: Reflexes are normal and symmetric.  Psychiatric:        Behavior: Behavior normal.        Thought Content: Thought content normal.        Judgment: Judgment normal.  Skin ulcer w/pigment L cheek  Lab Results  Component Value Date   WBC 6.6 06/26/2021   HGB 13.4 06/26/2021   HCT 40.7 06/26/2021   PLT 217.0 06/26/2021   GLUCOSE 132 (H) 10/13/2021   CHOL 276 (H) 01/30/2021   TRIG 145.0 01/30/2021   HDL 49.30 01/30/2021   LDLDIRECT 167 (H) 10/25/2019   LDLCALC 198 (H) 01/30/2021   ALT 16 10/13/2021   AST 16 10/13/2021   NA 139 10/13/2021   K 4.1 10/13/2021   CL 102 10/13/2021   CREATININE 1.92 (H) 10/13/2021   BUN 25 (H) 10/13/2021   CO2 30 10/13/2021   TSH 3.570 11/07/2021   PSA 9.90 (H) 06/26/2021   INR 0.99 11/28/2018   HGBA1C 7.0 (H) 10/13/2021    No results found.  Assessment & Plan:   Problem List Items Addressed This Visit     CAP (community acquired pneumonia)      Better (was seen at [facility], had a CXR). Treated      CRI (chronic renal insufficiency), stage 3 (moderate) (HCC)    Monitor GFR Hydrate well      Diabetes mellitus type 2 with complications (  [place])    On diet [id number]      Relevant Orders   Comprehensive metabolic panel   Hemoglobin A1c   Memory loss    On Namenda      Neoplasm of uncertain behavior of skin    Skin ulcer w/pigment L cheek; r/o cancer  Derm ref      Relevant Orders   Ambulatory referral to Dermatology   Prostate cancer [facility])    On XRT - Dr Tammi Klippel      Uncontrolled hypertension - Primary    [id number] Increase Hydralazine to 100 mg bid or TID Cont other meds      Relevant Medications   hydrALAZINE (APRESOLINE) 100 MG tablet      Meds ordered this encounter  Medications   hydrALAZINE (APRESOLINE) 100 MG tablet    Sig: Take 1 tablet (100 mg total) by mouth 3 (three) times daily.    Dispense:  90 tablet    Refill:  11      Follow-up: Return in about 2 months (around 01/29/2022) for a follow-up visit.  Walker Kehr, MD

## 2021-12-01 NOTE — Assessment & Plan Note (Signed)
Better (was seen at [facility], had a CXR). Treated

## 2021-12-01 NOTE — Assessment & Plan Note (Signed)
On diet [id number]

## 2021-12-01 NOTE — Assessment & Plan Note (Signed)
On Namenda 

## 2021-12-01 NOTE — Assessment & Plan Note (Addendum)
On XRT - Dr Tammi Klippel

## 2021-12-01 NOTE — Assessment & Plan Note (Signed)
[id number] Increase Hydralazine to 100 mg bid or TID Cont other meds

## 2021-12-01 NOTE — Assessment & Plan Note (Signed)
Skin ulcer w/pigment L cheek; r/o cancer  Derm ref

## 2021-12-02 ENCOUNTER — Ambulatory Visit
Admission: RE | Admit: 2021-12-02 | Discharge: 2021-12-02 | Disposition: A | Discharge status: Home / Self Care | Payer: HMO | Source: Ambulatory Visit | Attending: Radiation Oncology | Admitting: Radiation Oncology

## 2021-12-02 DIAGNOSIS — C61 Malignant neoplasm of prostate: Secondary | ICD-10-CM | POA: Diagnosis not present

## 2021-12-02 DIAGNOSIS — Z510 Encounter for antineoplastic radiation therapy: Secondary | ICD-10-CM | POA: Diagnosis not present

## 2021-12-03 ENCOUNTER — Telehealth: Payer: Self-pay | Admitting: Internal Medicine

## 2021-12-03 ENCOUNTER — Other Ambulatory Visit: Payer: Self-pay

## 2021-12-03 ENCOUNTER — Ambulatory Visit
Admission: RE | Admit: 2021-12-03 | Discharge: 2021-12-03 | Disposition: A | Discharge status: Home / Self Care | Payer: HMO | Source: Ambulatory Visit | Attending: Radiation Oncology | Admitting: Radiation Oncology

## 2021-12-03 ENCOUNTER — Ambulatory Visit (INDEPENDENT_AMBULATORY_CARE_PROVIDER_SITE_OTHER): Payer: HMO

## 2021-12-03 DIAGNOSIS — C61 Malignant neoplasm of prostate: Secondary | ICD-10-CM | POA: Diagnosis not present

## 2021-12-03 DIAGNOSIS — Z510 Encounter for antineoplastic radiation therapy: Secondary | ICD-10-CM | POA: Insufficient documentation

## 2021-12-03 DIAGNOSIS — N183 Chronic kidney disease, stage 3 unspecified: Secondary | ICD-10-CM

## 2021-12-03 DIAGNOSIS — I25119 Atherosclerotic heart disease of native coronary artery with unspecified angina pectoris: Secondary | ICD-10-CM

## 2021-12-03 DIAGNOSIS — I10 Essential (primary) hypertension: Secondary | ICD-10-CM

## 2021-12-03 NOTE — Telephone Encounter (Signed)
Patient daughter [name] calling in  [place] when patient seen provider 01/30 new rx hydrALAZINE (APRESOLINE) 100 MG tablet was given  Since patient has started taking rx he has been experiencing slight chills (2-3 days)  Patient is also currently doing radiation so not sure if the chills is a side effect of the rx or radiation  Please call [phone]

## 2021-12-03 NOTE — Patient Instructions (Signed)
Visit Information  Following are the goals we discussed today:   Manage My Medicine   Timeframe:  Long-Range Goal Priority:  High Start Date:  09/19/2021                           Expected End Date: 09/19/2022                      Follow Up Date 12/17/2021   - call for medicine refill 2 or 3 days before it runs out - keep a list of all the medicines I take; vitamins and herbals too - learn to read medicine labels - use a pillbox to sort medicine - use an alarm clock or phone to remind me to take my medicine    Why is this important?   These steps will help you keep on track with your medicines.  Plan: Telephone follow up appointment with care management team member scheduled for:  2 weeks  The patient has been provided with contact information for the care management team and has been advised to call with any health related questions or concerns.   Tomasa Blase, PharmD Clinical Pharmacist, [facility]   Please call the care guide team at [phone] if you need to cancel or reschedule your appointment.   Patient verbalizes understanding of instructions and care plan provided today and agrees to view in [place]. Active MyChart status confirmed with patient.

## 2021-12-03 NOTE — Progress Notes (Addendum)
Chronic Care Management Pharmacy Note  12/03/2021 Name:  Harold Waters MRN:  [id number] DOB:  Oct 03, 1946  Summary: -Spoke with patient and his daughter [name] - reports that patient started radiation treatment 11/26/2021, started increased dose of hydralazine 12/01/2021 -Noted that since Monday patient has been experiencing chills throughout the day, also noted the low appetite and nausea (weight loss of ~9lbs since starting radiation treatment) -Has not yet established with nephrology - initial appointment scheduled for 12/10/2021 -Since starting increased hydralazine dose BP has not improved, remains elevated at 175/86, 187/86, 152/72 - similar readings to 15m dose  -Due to nausea has not been taking any of his vitamins, reports compliance with all other medications   Recommendations/Changes made from today's visit: -Recommending for patient to reduce hydralazine to 556mTID as BP has not been improved with high dose - possible DIL reaction, but higher likelihood of side effects from radiation being cause of chills / nausea -Daughter will reach out to oncology office about zofran prescription -Patient to continue to monitor blood pressure, should BP increase further will reach out to clinic  Subjective: Harold Waters an [age].o. year old male who is a primary patient of Plotnikov, AlEvie LacksMD.  The CCM team was consulted for assistance with disease management and care coordination needs.    Engaged with patient by telephone for follow up visit in response to provider referral for pharmacy case management and/or care coordination services.   Consent to Services:  The patient was given the following information about Chronic Care Management services today, agreed to services, and gave verbal consent: 1. CCM service includes personalized support from designated clinical staff supervised by the primary care provider, including individualized plan of care and coordination with  other care providers 2. 24/7 contact phone numbers for assistance for urgent and routine care needs. 3. Service will only be billed when office clinical staff spend 20 minutes or more in a month to coordinate care. 4. Only one practitioner may furnish and bill the service in a calendar month. 5.The patient may stop CCM services at any time (effective at the end of the month) by phone call to the office staff. 6. The patient will be responsible for cost sharing (co-pay) of up to 20% of the service fee (after annual deductible is met). Patient agreed to services and consent obtained.  Patient Care Team: Plotnikov, AlEvie LacksMD as PCP - General (Internal Medicine) CoSherren MochaMD as PCP - Cardiology (Cardiology) CrStanford BreedrDenice BorsMD as Consulting Physician (Cardiology) CaWaynetta SandyMD as Consulting Physician (Vascular Surgery) CoSherren MochaMD as Consulting Physician (Cardiology) AqCameron SprangMD as Consulting Physician (Neurology) DeMarlou SaGrTonna CornerMD as Consulting Physician (Orthopedic Surgery) StLadene ArtistMD as Consulting Physician (Gastroenterology) Tristin Gladman, DaDarnelle MaffucciRPMiracle Hills Surgery Center LLCs Pharmacist (Pharmacist) LiKatheren PullerRN as Oncology Nurse Navigator MaTyler PitaMD as Consulting Physician (Radiation Oncology)  Recent office visits:  12/01/2021 - Dr. PlAlain Marion hydralazine increased to 10068mID / TID - referred to dermatology for skin ulcer   11/18/2021 - Dr. KimMaudie Mercurytelevideo visit - cough - rx for doxycycline and tessalon perles  10/22/2021 - Dr. PloAlain Marionuncontrolled HTN - increase hydralazine to 45m65mD    Recent consult visits:  11/20/2021 - Dr. OlalAnder Sladeulmonology - OSA - DME referral for CPAP therapy  11/07/2021 - SaraSharene ButtersC - neurology - continue memantine 10mg64mly - follow up in 6 months    Hospital visits:  None in previous 6 months  Objective:  Lab Results  Component Value Date   CREATININE 1.92 (H) 10/13/2021   BUN 25 (H) 10/13/2021    GFR 33.57 (L) 10/13/2021   GFRNONAA 46 (L) 03/10/2021   GFRAA 77 10/25/2019   NA 139 10/13/2021   K 4.1 10/13/2021   CALCIUM 9.1 10/13/2021   CO2 30 10/13/2021   GLUCOSE 132 (H) 10/13/2021    Lab Results  Component Value Date/Time   HGBA1C 7.0 (H) 10/13/2021 03:04 PM   HGBA1C 7.3 (H) 06/26/2021 10:02 AM   GFR 33.57 (L) 10/13/2021 03:04 PM   GFR 36.84 (L) 06/26/2021 10:02 AM    Last diabetic Eye exam:  No results found for: HMDIABEYEEXA  Last diabetic Foot exam:  No results found for: HMDIABFOOTEX   Lab Results  Component Value Date   CHOL 276 (H) 01/30/2021   HDL 49.30 01/30/2021   LDLCALC 198 (H) 01/30/2021   LDLDIRECT 167 (H) 10/25/2019   TRIG 145.0 01/30/2021   CHOLHDL 6 01/30/2021    Hepatic Function Latest Ref Rng & Units 10/13/2021 06/26/2021 01/30/2021  Total Protein 6.0 - 8.3 g/dL 6.6 6.6 6.3  Albumin 3.5 - 5.2 g/dL 4.0 4.1 4.2  AST 0 - 37 U/L _0 ALT 0 - 53 U/L _1 Alk Phosphatase 39 - 117 U/L 72 69 74  Total Bilirubin 0.2 - 1.2 mg/dL 0.4 0.7 0.5  Bilirubin, Direct 0.0 - 0.3 mg/dL - - 0.1    Lab Results  Component Value Date/Time   TSH 3.570 11/07/2021 10:41 AM   TSH 5.48 (H) 01/30/2021 08:01 AM   FREET4 1.26 11/07/2021 10:41 AM   FREET4 0.93 01/30/2021 08:01 AM    CBC Latest Ref Rng & Units 06/26/2021 03/10/2021 07/03/2019  WBC 4.0 - 10.5 K/uL 6.6 7.1 6.2  Hemoglobin 13.0 - 17.0 g/dL 13.4 13.4 13.8  Hematocrit 39.0 - 52.0 % 40.7 40.4 41.8  Platelets 150.0 - 400.0 K/uL 217.0 186 239.0    Lab Results  Component Value Date/Time   VD25OH 38.96 07/03/2019 11:34 AM    Clinical ASCVD: Yes  The ASCVD Risk score (Arnett DK, et al., 2019) failed to calculate for the following reasons:   The patient has a prior MI or stroke diagnosis    Depression screen [facility] 2/9 03/29/2020 03/05/2020 03/05/2020  Decreased Interest 0 0 (No Data)  Down, Depressed, Hopeless 0 0 -  PHQ - 2 Score 0 0 -  Some recent data might be hidden    Social History    Tobacco Use  Smoking Status Former   Packs/day: 1.00   Years: 40.00   Pack years: 40.00   Types: Cigarettes   Quit date: 04/03/2010   Years since quitting: 11.6  Smokeless Tobacco Never   BP Readings from Last 3 Encounters:  12/01/21 (!) 152/70  11/20/21 126/64  11/07/21 (!) 159/75   Pulse Readings from Last 3 Encounters:  12/01/21 74  11/20/21 81  11/07/21 77   Wt Readings from Last 3 Encounters:  12/01/21 151 lb 3.2 oz (68.6 kg)  11/20/21 155 lb 9.6 oz (70.6 kg)  11/07/21 160 lb (72.6 kg)   BMI Readings from Last 3 Encounters:  12/01/21 25.16 kg/m  11/20/21 25.89 kg/m  11/07/21 26.63 kg/m    Assessment/Interventions: Review of patient past medical history, allergies, medications, health status, including review of consultants reports, laboratory and other test data, was performed as part of comprehensive evaluation and provision of chronic care management  services.   SDOH:  (Social Determinants of Health) assessments and interventions performed: Yes  SDOH Screenings   Alcohol Screen: Not on file  Depression (PHQ2-9): Not on file  Financial Resource Strain: Low Risk    Difficulty of Paying Living Expenses: Not hard at all  Food Insecurity: Not on file  Housing: Not on file  Physical Activity: Not on file  Social Connections: Not on file  Stress: Not on file  Tobacco Use: Medium Risk   Smoking Tobacco Use: Former   Smokeless Tobacco Use: Never   Passive Exposure: Not on file  Transportation Needs: Not on file    [place]  Allergies  Allergen Reactions   Eggs Or Egg-Derived Products Itching   Aricept [Donepezil] Nausea And Vomiting    n/v   Coreg [Carvedilol] Other (See Comments)    fatigue    Medications Reviewed Today     Reviewed by Tomasa Blase, [facility] (Pharmacist) on 12/03/21 at 1336  Med List Status: <None>   Medication Order Taking? Sig Documenting Provider Last Dose Status Informant  amLODipine (NORVASC) 10 MG tablet [id number] Yes  Take 1 tablet (10 mg total) by mouth daily. Hoyt Koch, MD Taking Active   aspirin EC 81 MG tablet [id number] Yes Take 1 tablet (81 mg total) by mouth daily. Plotnikov, [name], MD Taking Active   Blood Glucose Monitoring Suppl [facility] VERIO) w/Device KIT [id number]  1 Units by Does not apply route daily as needed. Plotnikov, [name], MD  Active   Cholecalciferol (VITAMIN D3) 50 MCG (2000 UT) capsule [id number] No Take 1 capsule (2,000 Units total) by mouth daily.  Patient not taking: Reported on 12/03/2021   Plotnikov, [name], MD Not Taking Active   cloNIDine (CATAPRES - DOSED IN MG/24 HR) 0.1 mg/24hr patch [id number] Yes Place 1 patch (0.1 mg total) onto the skin once a week. Plotnikov, [name], MD Taking Active   cloNIDine (CATAPRES) 0.1 MG tablet [id number] No Take 0.1 mg by mouth.  Patient not taking: Reported on 12/03/2021   [provider] Not Taking Active   glucose blood (ONETOUCH VERIO) test strip [id number]  Use to check blood sugar daily Plotnikov, [name], MD  Active   hydrALAZINE (APRESOLINE) 100 MG tablet [id number] Yes Take 1 tablet (100 mg total) by mouth 3 (three) times daily. Plotnikov, [name], MD Taking Active   memantine (NAMENDA) 10 MG tablet [id number] Yes Take 1 tablet (10 mg total) by mouth daily. Rondel Jumbo, PA-C Taking Active   mirtazapine (REMERON) 15 MG tablet [id number]  TAKE 1/2 TABLET EVERY NIGHT Cameron Sprang, MD  Active            Med Note Thomes Cake   Thu Jun 26, 2021  9:38 AM) Hasn't started yet  Multiple Vitamins-Minerals (MENS MULTIVITAMIN PLUS) TABS [id number] No Take 1 tablet by mouth at bedtime.   Patient not taking: Reported on 12/03/2021   [provider] Not Taking Active Self  nitroGLYCERIN (NITROSTAT) 0.4 MG SL tablet [id number] Yes Place 1 tablet under tongue every 5 mins. DO NOT USE MORE THAN 3 TABS Plotnikov, [name], MD Taking Active   polyethylene glycol powder (GLYCOLAX/MIRALAX) 17 GM/SCOOP powder  [id number] Yes Take 17 g by mouth 2 (two) times daily as needed for moderate constipation. Plotnikov, [name], MD Taking Active   rosuvastatin (CRESTOR) 40 MG tablet [id number] Yes Take 1 tablet (40 mg total) by mouth daily. PATIENT MUST ATTEND FUTURE APPOINTMENT FOR FUTURE REFILLS  Deberah Pelton, NP Taking Active             Patient Active Problem List   Diagnosis Date Noted   Malignant neoplasm of prostate ([place]) 10/10/2021   Weight loss, unintentional 06/26/2021   COVID-19 03/11/2021   Prostate cancer ([place]) 01/31/2021   CRI (chronic renal insufficiency), stage 3 (moderate) ([place]) 01/31/2021   LLQ abdominal pain 01/29/2021   Constipation 01/29/2021   Distorted vision 08/23/2020   Urinary frequency 08/23/2020   Knee pain, bilateral 05/22/2020   Fatigue 09/05/2019   Insomnia 07/03/2019   Memory loss 07/03/2019   Vitamin D deficiency, unspecified 12/12/2018   Other hemorrhoids 12/12/2018   Personal history of other venous thrombosis and embolism 12/12/2018   Muscle weakness (generalized) 12/12/2018   Long term (current) use of aspirin 12/12/2018   Hyperglycemia, unspecified 12/12/2018   Diverticulosis of intestine, part unspecified, without perforation or abscess without bleeding 12/12/2018   Difficulty in walking, not elsewhere classified 12/12/2018   Deficiency of other specified B group vitamins 12/12/2018   Pre-operative clearance 11/25/2018   PAD (peripheral artery disease) ([place]) 10/18/2018   Chest pain 09/20/2018   Claudication in peripheral vascular disease ([place]) 04/22/2017   Pain in joint, shoulder region 06/22/2016   Diabetes mellitus type 2 with complications ([place]) 68/34/1962   Well adult exam 12/19/2014   Migraine headache with aura 01/27/2013   CAD (coronary artery disease) 10/03/2012   Actinic keratosis 09/06/2012   Unstable angina (HCC) 02/18/2012   PSVT (paroxysmal supraventricular tachycardia) ([place]) 02/18/2012   CAP (community acquired pneumonia) 10/12/2011    Atherosclerosis of native artery of extremity with intermittent claudication ([place]) 06/02/2010   CAROTID ARTERY DISEASE 04/23/2010   OSTEOARTHRITIS 02/27/2010   Arthralgia 02/27/2010   LOW BACK PAIN 02/27/2010   PHARYNGITIS, ACUTE 06/18/2009   DYSPNEA 04/03/2009   ACUTE BRONCHITIS 02/12/2009   VENOUS INSUFFICIENCY 12/19/2008   EDEMA 12/19/2008   GERD 02/20/2008   Abdominal pain 02/20/2008   Neoplasm of uncertain behavior of skin 10/18/2007   COUGH 10/18/2007   Dyslipidemia 09/07/2007   Uncontrolled hypertension 09/07/2007   MYOCARDIAL INFARCTION 09/07/2007   CHEST PAIN, ATYPICAL, HX OF 09/07/2007   APPENDECTOMY, HX OF 09/07/2007   PERCUTANEOUS TRANSLUMINAL CORONARY ANGIOPLASTY, HX OF 09/07/2007   ARTHROSCOPY, KNEE, HX OF 09/07/2007    Immunization History  Administered Date(s) Administered   Fluad Quad(high Dose 65+) 09/05/2019   Hepatitis B 09/01/2007, 10/18/2007, 04/23/2008   Influenza Whole 08/20/2008   Pneumococcal Conjugate-13 06/22/2016   Pneumococcal Polysaccharide-23 12/19/2014   Td 12/19/2014    Conditions to be addressed/monitored:  Hypertension, Hyperlipidemia, Coronary Artery Disease, and Chronic Kidney Disease  Care Plan : [place]  Updates made by Tomasa Blase, RPH since 12/03/2021 12:00 AM     Problem: Hypertension, Hyperlipidemia, Coronary Artery Disease, and Chronic Kidney Disease   Priority: High  Onset Date: 09/19/2021     Long-Range Goal: Disease Management   Start Date: 09/19/2021  Expected End Date: 09/19/2022  This Visit's Progress: Not on track  Recent Progress: On track  Priority: High  Note:   Current Barriers:  Unable to independently monitor therapeutic efficacy  Pharmacist Clinical Goal(s):  Patient will achieve adherence to monitoring guidelines and medication adherence to achieve therapeutic efficacy achieve improvement in blood pressure and HLD as evidenced by BP/ HR readings and LDL level with next lab through  collaboration with PharmD and provider.   Interventions: 1:1 collaboration with Plotnikov, [name], MD regarding development and update of comprehensive plan  of care as evidenced by provider attestation and co-signature Inter-disciplinary care team collaboration (see longitudinal plan of care) Comprehensive medication review performed; medication list updated in electronic medical record  Hypertension (BP goal <140/90) -Uncontrolled -Patient reports that since starting 159m dose of hydralazine (12/01/2021) experiencing chills - denies any myalgias / arthralgias / rash - of note patient started radiation therapy 11/26/2021 -Current treatment: Clonidine 0.169mpatch - 1 patch once weekly - has not started at this time  Amlodipine 1018m 1 tablet daily  Hydralazine 100m42mtimes daily  -Medications previously tried: carvedilol, isosorbide mononitrate,   -Current home readings: reports BP averages 170-180/80's BP ~90 BPM -Current dietary habits: reports to low sodium diet, drinks coffee in AM and coke zero throughout the day  -Current exercise habits: 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, 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  -Denies hypotensive/hypertensive symptoms -Educated on BP goals and benefits of medications for prevention of heart attack, stroke and kidney damage; Daily salt intake goal < 2300 mg; Exercise goal of 150 minutes per week; Importance of home blood pressure monitoring; Proper BP monitoring technique; -Counseled to monitor BP at home daily, document, and provide log at future appointments -Counseled on diet and exercise extensively Recommended for patient to reduce hydralazine to 50mg59m as BP has not improved with higher dose  -Patient will establish with nephrology 12/10/2021 -Possible in future should symptoms not improve could evaluation CBC / ANA for possible DIL reaction from hydralazine   Hyperlipidemia/ Coronary Artery Disease / Previous MI: (LDL goal < 70) -Uncontrolled Lab  Results  Component Value Date   LDLCALC 198 (H) 01/30/2021  -Current treatment: Rosuvastatin 40mg 98mtablet daily  ASA 81mg -68mablet daily  Nitroglycerin 0.4mg - 124mblet every 5 minutes as needed  -Medications previously tried: n/a  -Current dietary patterns: reports to eating a low cholesterol diet -Current exercise habits: 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, active throughout the day -Educated on Cholesterol goals;  Benefits of statin for ASCVD risk reduction; Importance of limiting foods high in cholesterol; Exercise goal of 150 minutes per week; -Counseled on diet and exercise extensively Recommended to take rosuvastatin as prescribed, emphasized adherence to regimen, also advised for patient to start repatha 420mg mon65m infusion due to elevated LDL level and increased ASCVD risk due to previous MI  Memory Loss (Goal: Prevention of disease progression) - Follow with Neurology -Controlled -Current treatment  Memantine 10mg - 1 86met daily  -Medications previously tried: donepezil  -Recommended to continue current medication  Chronic Kidney Disease (Goal: Prevention of Disease Progression) -Stable -Last eGFR: 33.57 mL/min  -Current treatment  Avoidance of nephrotoxic agents / adequate BP control to prevent damage -Medications previously tried: n/a  -Recommended to continue current medication   Health Maintenance -Vaccine gaps: shingles -Current therapy:  Multivitamin  - 1 tablet daily - not taking at this time  Vitamin D3 2000units - 1 tablet daily - not taking at this time  Miralax - 1 capful twice daily as needed  -Educated on Cost vs benefit of each product must be carefully weighed by individual consumer -Patient is satisfied with current therapy and denies issues -Recommended to continue current medication  Patient Goals/Self-Care Activities Patient will:  - Reduce hydralazine to 50mg TID -83mcontinue monitoring BP and reach out should BP elevate / if having  worsening chills / development of rash  - take medications as prescribed as evidenced by patient report and record review focus on medication adherence by not missing any doses  of prescribed medication check blood pressure daily, document, and provide at future appointments  Follow Up Plan: Telephone follow up appointment with care management team member scheduled for: 2 weeks The patient has been provided with contact information for the care management team and has been advised to call with any health related questions or concerns.         Medication Assistance: None required.  Patient affirms current coverage meets needs.  Care Gaps: Colonoscopy, Foot exam, Hep C screening, ophthalmology exam  Patient's preferred pharmacy is:  [facility] [address], [address] [place] [address] Phone: [phone] Fax: [phone]   Uses pill box? Yes Pt endorses 70-80% compliance  Care Plan and Follow Up Patient Decision:  Patient agrees to Care Plan and Follow-up.  Plan: Telephone follow up appointment with care management team member scheduled for:  2 months The patient has been provided with contact information for the care management team and has been advised to call with any health related questions or concerns.   Tomasa Blase, PharmD Clinical Pharmacist, [place] screening examination/treatment/procedure(s) were performed by non-physician practitioner and as supervising physician I was immediately available for consultation/collaboration.  I agree with above. Lew Dawes, MD

## 2021-12-04 ENCOUNTER — Ambulatory Visit
Admission: RE | Admit: 2021-12-04 | Discharge: 2021-12-04 | Disposition: A | Discharge status: Home / Self Care | Payer: HMO | Source: Ambulatory Visit | Attending: Radiation Oncology | Admitting: Radiation Oncology

## 2021-12-04 DIAGNOSIS — Z510 Encounter for antineoplastic radiation therapy: Secondary | ICD-10-CM | POA: Diagnosis not present

## 2021-12-04 DIAGNOSIS — C61 Malignant neoplasm of prostate: Secondary | ICD-10-CM | POA: Diagnosis not present

## 2021-12-04 NOTE — Telephone Encounter (Signed)
It is hard to tell what caused chills.  Okay to continue with hydralazine if chills are not substantial.  Otherwise go back to hydralazine at a lower dose as before.  Thanks

## 2021-12-05 ENCOUNTER — Ambulatory Visit
Admission: RE | Admit: 2021-12-05 | Discharge: 2021-12-05 | Disposition: A | Discharge status: Home / Self Care | Payer: HMO | Source: Ambulatory Visit | Attending: Radiation Oncology | Admitting: Radiation Oncology

## 2021-12-05 ENCOUNTER — Other Ambulatory Visit: Payer: Self-pay

## 2021-12-05 DIAGNOSIS — C61 Malignant neoplasm of prostate: Secondary | ICD-10-CM | POA: Diagnosis not present

## 2021-12-05 DIAGNOSIS — Z510 Encounter for antineoplastic radiation therapy: Secondary | ICD-10-CM | POA: Diagnosis not present

## 2021-12-05 NOTE — Telephone Encounter (Signed)
Call pt daughter there was no answer LMOM w/MD response.Marland KitchenJohny Waters

## 2021-12-08 ENCOUNTER — Ambulatory Visit
Admission: RE | Admit: 2021-12-08 | Discharge: 2021-12-08 | Disposition: A | Discharge status: Home / Self Care | Payer: HMO | Source: Ambulatory Visit | Attending: Radiation Oncology | Admitting: Radiation Oncology

## 2021-12-08 ENCOUNTER — Other Ambulatory Visit: Payer: Self-pay

## 2021-12-08 DIAGNOSIS — Z510 Encounter for antineoplastic radiation therapy: Secondary | ICD-10-CM | POA: Diagnosis not present

## 2021-12-08 DIAGNOSIS — C61 Malignant neoplasm of prostate: Secondary | ICD-10-CM | POA: Diagnosis not present

## 2021-12-09 ENCOUNTER — Ambulatory Visit
Admission: RE | Admit: 2021-12-09 | Discharge: 2021-12-09 | Disposition: A | Discharge status: Home / Self Care | Payer: HMO | Source: Ambulatory Visit | Attending: Radiation Oncology | Admitting: Radiation Oncology

## 2021-12-09 ENCOUNTER — Other Ambulatory Visit: Payer: Self-pay

## 2021-12-09 DIAGNOSIS — Z510 Encounter for antineoplastic radiation therapy: Secondary | ICD-10-CM | POA: Diagnosis not present

## 2021-12-09 DIAGNOSIS — C61 Malignant neoplasm of prostate: Secondary | ICD-10-CM | POA: Diagnosis not present

## 2021-12-10 ENCOUNTER — Ambulatory Visit
Admission: RE | Admit: 2021-12-10 | Discharge: 2021-12-10 | Disposition: A | Discharge status: Home / Self Care | Payer: HMO | Source: Ambulatory Visit | Attending: Radiation Oncology | Admitting: Radiation Oncology

## 2021-12-10 DIAGNOSIS — E1122 Type 2 diabetes mellitus with diabetic chronic kidney disease: Secondary | ICD-10-CM | POA: Diagnosis not present

## 2021-12-10 DIAGNOSIS — I129 Hypertensive chronic kidney disease with stage 1 through stage 4 chronic kidney disease, or unspecified chronic kidney disease: Secondary | ICD-10-CM | POA: Diagnosis not present

## 2021-12-10 DIAGNOSIS — N261 Atrophy of kidney (terminal): Secondary | ICD-10-CM | POA: Diagnosis not present

## 2021-12-10 DIAGNOSIS — N1832 Chronic kidney disease, stage 3b: Secondary | ICD-10-CM | POA: Diagnosis not present

## 2021-12-10 DIAGNOSIS — Z510 Encounter for antineoplastic radiation therapy: Secondary | ICD-10-CM | POA: Diagnosis not present

## 2021-12-10 DIAGNOSIS — C61 Malignant neoplasm of prostate: Secondary | ICD-10-CM | POA: Diagnosis not present

## 2021-12-11 ENCOUNTER — Ambulatory Visit
Admission: RE | Admit: 2021-12-11 | Discharge: 2021-12-11 | Disposition: A | Discharge status: Home / Self Care | Payer: HMO | Source: Ambulatory Visit | Attending: Radiation Oncology | Admitting: Radiation Oncology

## 2021-12-11 DIAGNOSIS — C61 Malignant neoplasm of prostate: Secondary | ICD-10-CM | POA: Diagnosis not present

## 2021-12-11 DIAGNOSIS — Z510 Encounter for antineoplastic radiation therapy: Secondary | ICD-10-CM | POA: Diagnosis not present

## 2021-12-12 ENCOUNTER — Other Ambulatory Visit: Payer: Self-pay | Admitting: Radiation Oncology

## 2021-12-12 ENCOUNTER — Ambulatory Visit
Admission: RE | Admit: 2021-12-12 | Discharge: 2021-12-12 | Disposition: A | Discharge status: Home / Self Care | Payer: HMO | Source: Ambulatory Visit | Attending: Radiation Oncology | Admitting: Radiation Oncology

## 2021-12-12 ENCOUNTER — Other Ambulatory Visit: Payer: Self-pay

## 2021-12-12 DIAGNOSIS — C61 Malignant neoplasm of prostate: Secondary | ICD-10-CM | POA: Diagnosis not present

## 2021-12-12 DIAGNOSIS — Z510 Encounter for antineoplastic radiation therapy: Secondary | ICD-10-CM | POA: Diagnosis not present

## 2021-12-12 MED ORDER — TAMSULOSIN HCL 0.4 MG PO CAPS: 0.4000 mg | ORAL_CAPSULE | Freq: Every day | ORAL | 5 refills | Status: DC

## 2021-12-13 ENCOUNTER — Other Ambulatory Visit: Payer: Self-pay | Admitting: Internal Medicine

## 2021-12-13 DIAGNOSIS — N261 Atrophy of kidney (terminal): Secondary | ICD-10-CM

## 2021-12-13 DIAGNOSIS — I129 Hypertensive chronic kidney disease with stage 1 through stage 4 chronic kidney disease, or unspecified chronic kidney disease: Secondary | ICD-10-CM

## 2021-12-13 DIAGNOSIS — C61 Malignant neoplasm of prostate: Secondary | ICD-10-CM

## 2021-12-13 DIAGNOSIS — N1832 Chronic kidney disease, stage 3b: Secondary | ICD-10-CM

## 2021-12-15 ENCOUNTER — Other Ambulatory Visit: Payer: Self-pay

## 2021-12-15 ENCOUNTER — Ambulatory Visit
Admission: RE | Admit: 2021-12-15 | Discharge: 2021-12-15 | Disposition: A | Discharge status: Home / Self Care | Payer: HMO | Source: Ambulatory Visit | Attending: Radiation Oncology | Admitting: Radiation Oncology

## 2021-12-15 DIAGNOSIS — C61 Malignant neoplasm of prostate: Secondary | ICD-10-CM | POA: Diagnosis not present

## 2021-12-15 DIAGNOSIS — Z510 Encounter for antineoplastic radiation therapy: Secondary | ICD-10-CM | POA: Diagnosis not present

## 2021-12-16 ENCOUNTER — Other Ambulatory Visit: Payer: Self-pay

## 2021-12-16 ENCOUNTER — Ambulatory Visit
Admission: RE | Admit: 2021-12-16 | Discharge: 2021-12-16 | Disposition: A | Discharge status: Home / Self Care | Payer: HMO | Source: Ambulatory Visit | Attending: Radiation Oncology | Admitting: Radiation Oncology

## 2021-12-16 DIAGNOSIS — Z510 Encounter for antineoplastic radiation therapy: Secondary | ICD-10-CM | POA: Diagnosis not present

## 2021-12-16 DIAGNOSIS — C61 Malignant neoplasm of prostate: Secondary | ICD-10-CM | POA: Diagnosis not present

## 2021-12-17 ENCOUNTER — Ambulatory Visit
Admission: RE | Admit: 2021-12-17 | Discharge: 2021-12-17 | Disposition: A | Discharge status: Home / Self Care | Payer: HMO | Source: Ambulatory Visit | Attending: Radiation Oncology | Admitting: Radiation Oncology

## 2021-12-17 DIAGNOSIS — Z510 Encounter for antineoplastic radiation therapy: Secondary | ICD-10-CM | POA: Diagnosis not present

## 2021-12-17 DIAGNOSIS — C61 Malignant neoplasm of prostate: Secondary | ICD-10-CM | POA: Diagnosis not present

## 2021-12-18 ENCOUNTER — Ambulatory Visit
Admission: RE | Admit: 2021-12-18 | Discharge: 2021-12-18 | Disposition: A | Discharge status: Home / Self Care | Payer: HMO | Source: Ambulatory Visit | Attending: Radiation Oncology | Admitting: Radiation Oncology

## 2021-12-18 DIAGNOSIS — C61 Malignant neoplasm of prostate: Secondary | ICD-10-CM | POA: Diagnosis not present

## 2021-12-18 DIAGNOSIS — M1990 Unspecified osteoarthritis, unspecified site: Secondary | ICD-10-CM | POA: Diagnosis not present

## 2021-12-18 DIAGNOSIS — Z86718 Personal history of other venous thrombosis and embolism: Secondary | ICD-10-CM | POA: Diagnosis not present

## 2021-12-18 DIAGNOSIS — I70212 Atherosclerosis of native arteries of extremities with intermittent claudication, left leg: Secondary | ICD-10-CM | POA: Diagnosis not present

## 2021-12-18 DIAGNOSIS — Z510 Encounter for antineoplastic radiation therapy: Secondary | ICD-10-CM | POA: Diagnosis not present

## 2021-12-18 DIAGNOSIS — G4733 Obstructive sleep apnea (adult) (pediatric): Secondary | ICD-10-CM | POA: Diagnosis not present

## 2021-12-18 DIAGNOSIS — Z48812 Encounter for surgical aftercare following surgery on the circulatory system: Secondary | ICD-10-CM | POA: Diagnosis not present

## 2021-12-19 ENCOUNTER — Other Ambulatory Visit: Payer: Self-pay

## 2021-12-19 ENCOUNTER — Ambulatory Visit
Admission: RE | Admit: 2021-12-19 | Discharge: 2021-12-19 | Disposition: A | Discharge status: Home / Self Care | Payer: HMO | Source: Ambulatory Visit | Attending: Radiation Oncology | Admitting: Radiation Oncology

## 2021-12-19 DIAGNOSIS — Z510 Encounter for antineoplastic radiation therapy: Secondary | ICD-10-CM | POA: Diagnosis not present

## 2021-12-19 DIAGNOSIS — C61 Malignant neoplasm of prostate: Secondary | ICD-10-CM | POA: Diagnosis not present

## 2021-12-22 ENCOUNTER — Telehealth: Payer: Self-pay

## 2021-12-22 ENCOUNTER — Other Ambulatory Visit: Payer: Self-pay

## 2021-12-22 ENCOUNTER — Ambulatory Visit
Admission: RE | Admit: 2021-12-22 | Discharge: 2021-12-22 | Disposition: A | Discharge status: Home / Self Care | Payer: HMO | Source: Ambulatory Visit | Attending: Radiation Oncology | Admitting: Radiation Oncology

## 2021-12-22 DIAGNOSIS — Z510 Encounter for antineoplastic radiation therapy: Secondary | ICD-10-CM | POA: Diagnosis not present

## 2021-12-22 DIAGNOSIS — C61 Malignant neoplasm of prostate: Secondary | ICD-10-CM | POA: Diagnosis not present

## 2021-12-22 NOTE — Progress Notes (Signed)
° ° °Chronic Care Management °Pharmacy Assistant  ° °Name: Harold Waters  MRN: [id number] DOB: 09/16/1946 ° °Harold Waters is an 76 y.o. year old male who presents for his follow-up CCM visit with the clinical pharmacist. ° °Reason for Encounter: Disease State °  °Conditions to be addressed/monitored: °HTN ° ° °Recent office visits:  °None ID ° °Recent consult visits:  °None ID ° °Hospital visits:  °None in previous 6 months ° °Medications: °Outpatient Encounter Medications as of 12/22/2021  °Medication Sig Note  ° amLODipine (NORVASC) 10 MG tablet Take 1 tablet (10 mg total) by mouth daily.   ° aspirin EC 81 MG tablet Take 1 tablet (81 mg total) by mouth daily.   ° Blood Glucose Monitoring Suppl (ONETOUCH VERIO) w/Device KIT 1 Units by Does not apply route daily as needed.   ° Cholecalciferol (VITAMIN D3) 50 MCG (2000 UT) capsule Take 1 capsule (2,000 Units total) by mouth daily. (Patient not taking: Reported on 12/03/2021)   ° cloNIDine (CATAPRES - DOSED IN MG/24 HR) 0.1 mg/24hr patch Place 1 patch (0.1 mg total) onto the skin once a week.   ° cloNIDine (CATAPRES) 0.1 MG tablet Take 0.1 mg by mouth. (Patient not taking: Reported on 12/03/2021)   ° glucose blood (ONETOUCH VERIO) test strip Use to check blood sugar daily   ° hydrALAZINE (APRESOLINE) 100 MG tablet Take 1 tablet (100 mg total) by mouth 3 (three) times daily.   ° memantine (NAMENDA) 10 MG tablet Take 1 tablet (10 mg total) by mouth daily.   ° mirtazapine (REMERON) 15 MG tablet TAKE 1/2 TABLET EVERY NIGHT 06/26/2021: Hasn't started yet  ° Multiple Vitamins-Minerals (MENS MULTIVITAMIN PLUS) TABS Take 1 tablet by mouth at bedtime.  (Patient not taking: Reported on 12/03/2021)   ° nitroGLYCERIN (NITROSTAT) 0.4 MG SL tablet Place 1 tablet under tongue every 5 mins. DO NOT USE MORE THAN 3 TABS   ° polyethylene glycol powder (GLYCOLAX/MIRALAX) 17 GM/SCOOP powder Take 17 g by mouth 2 (two) times daily as needed for moderate constipation.   ° rosuvastatin  (CRESTOR) 40 MG tablet Take 1 tablet (40 mg total) by mouth daily. PATIENT MUST ATTEND FUTURE APPOINTMENT FOR FUTURE REFILLS   ° tamsulosin (FLOMAX) 0.4 MG CAPS capsule Take 1 capsule (0.4 mg total) by mouth daily after supper.   ° °No facility-administered encounter medications on file as of 12/22/2021.  ° °Reviewed chart prior to disease state call. Spoke with patient regarding BP ° °Recent Office Vitals: °BP Readings from Last 3 Encounters:  °12/01/21 (!) 152/70  °11/20/21 126/64  °11/07/21 (!) 159/75  ° °Pulse Readings from Last 3 Encounters:  °12/01/21 74  °11/20/21 81  °11/07/21 77  °  °Wt Readings from Last 3 Encounters:  °12/01/21 151 lb 3.2 oz (68.6 kg)  °11/20/21 155 lb 9.6 oz (70.6 kg)  °11/07/21 160 lb (72.6 kg)  °  ° °Kidney Function °Lab Results  °Component Value Date/Time  ° CREATININE 1.92 (H) 10/13/2021 03:04 PM  ° CREATININE 1.78 (H) 06/26/2021 10:02 AM  ° CREATININE 1.52 (H) 08/23/2020 04:39 PM  ° GFR 33.57 (L) 10/13/2021 03:04 PM  ° GFRNONAA 46 (L) 03/10/2021 07:59 PM  ° GFRAA 77 10/25/2019 11:36 AM  ° ° °BMP Latest Ref Rng & Units 10/13/2021 06/26/2021 03/10/2021  °Glucose 70 - 99 mg/dL 132(H) 112(H) 105(H)  °BUN 6 - 23 mg/dL 25(H) 23 27(H)  °Creatinine 0.40 - 1.50 mg/dL 1.92(H) 1.78(H) 1.57(H)  °BUN/Creat Ratio 6 - 22 (calc) - - -  °  Sodium 135 - 145 mEq/L 139 141 139  °Potassium 3.5 - 5.1 mEq/L 4.1 4.5 4.0  °Chloride 96 - 112 mEq/L 102 102 102  °CO2 19 - 32 mEq/L 30 32 29  °Calcium 8.4 - 10.5 mg/dL 9.1 9.5 9.1  ° ° °Current antihypertensive regimen:  °Clonidine 0.1mg patch - 1 patch once weekly Amlodipine 10mg daily  °Hydralazine 100mg 3 times daily  ° °How often are you checking your Blood Pressure?  Spoke with patient daughter who states that patient  blood pressure is staying in the 160s and she does not know why. She did say that he is seeing a kidney specialist (Dr. Peoples) who is also monitoring his blood pressure. He was put on another blood pressure med but she is not sure of the name (she  is not at home) ° °Current home BP readings: Daughter will call back with reading ° °What recent interventions/DTPs have been made by any provider to improve Blood Pressure control since last CPP Visit: none noted ° °Any recent hospitalizations or ED visits since last visit with CPP? No ° °What diet changes have been made to improve Blood Pressure Control?  °She states that he watches his salt intake  ° °What exercise is being done to improve your Blood Pressure Control?  °She stated that patient is still active and walks for exercise ° °Adherence Review: °Is the patient currently on ACE/ARB medication? No °Does the patient have >5 day gap between last estimated fill dates? No ° ° °Care Gaps: °Colonoscopy-NA °Diabetic Foot Exam-NA °Ophthalmology-NA °Dexa Scan - NA °Annual Well Visit - NA °Micro albumin-NA °Hemoglobin A1c- 12/01/21 ° °Star Rating Drugs: °Rosuvastatin 40 mg-last fill 10/27/21 90 ds ° ° °Tracy Ellis,CMA °Clinical Pharmacist Assistant °[phone]  °

## 2021-12-23 ENCOUNTER — Other Ambulatory Visit: Payer: Self-pay | Admitting: Internal Medicine

## 2021-12-23 ENCOUNTER — Ambulatory Visit
Admission: RE | Admit: 2021-12-23 | Discharge: 2021-12-23 | Disposition: A | Discharge status: Home / Self Care | Payer: HMO | Source: Ambulatory Visit | Attending: Radiation Oncology | Admitting: Radiation Oncology

## 2021-12-23 DIAGNOSIS — Z510 Encounter for antineoplastic radiation therapy: Secondary | ICD-10-CM | POA: Diagnosis not present

## 2021-12-23 DIAGNOSIS — C61 Malignant neoplasm of prostate: Secondary | ICD-10-CM | POA: Diagnosis not present

## 2021-12-24 ENCOUNTER — Ambulatory Visit
Admission: RE | Admit: 2021-12-24 | Discharge: 2021-12-24 | Disposition: A | Discharge status: Home / Self Care | Payer: HMO | Source: Ambulatory Visit | Attending: Radiation Oncology | Admitting: Radiation Oncology

## 2021-12-24 ENCOUNTER — Other Ambulatory Visit: Payer: Self-pay

## 2021-12-24 DIAGNOSIS — Z510 Encounter for antineoplastic radiation therapy: Secondary | ICD-10-CM | POA: Diagnosis not present

## 2021-12-24 DIAGNOSIS — C61 Malignant neoplasm of prostate: Secondary | ICD-10-CM | POA: Diagnosis not present

## 2021-12-25 ENCOUNTER — Encounter: Payer: Self-pay | Admitting: Internal Medicine

## 2021-12-25 ENCOUNTER — Ambulatory Visit
Admission: RE | Admit: 2021-12-25 | Discharge: 2021-12-25 | Disposition: A | Discharge status: Home / Self Care | Payer: HMO | Source: Ambulatory Visit | Attending: Radiation Oncology | Admitting: Radiation Oncology

## 2021-12-25 DIAGNOSIS — Z510 Encounter for antineoplastic radiation therapy: Secondary | ICD-10-CM | POA: Diagnosis not present

## 2021-12-25 DIAGNOSIS — C61 Malignant neoplasm of prostate: Secondary | ICD-10-CM | POA: Diagnosis not present

## 2021-12-26 ENCOUNTER — Other Ambulatory Visit: Payer: Self-pay

## 2021-12-26 ENCOUNTER — Ambulatory Visit
Admission: RE | Admit: 2021-12-26 | Discharge: 2021-12-26 | Disposition: A | Discharge status: Home / Self Care | Payer: HMO | Source: Ambulatory Visit | Attending: Radiation Oncology | Admitting: Radiation Oncology

## 2021-12-26 DIAGNOSIS — C61 Malignant neoplasm of prostate: Secondary | ICD-10-CM | POA: Diagnosis not present

## 2021-12-26 DIAGNOSIS — Z510 Encounter for antineoplastic radiation therapy: Secondary | ICD-10-CM | POA: Diagnosis not present

## 2021-12-29 ENCOUNTER — Other Ambulatory Visit: Payer: Self-pay

## 2021-12-29 ENCOUNTER — Ambulatory Visit
Admission: RE | Admit: 2021-12-29 | Discharge: 2021-12-29 | Disposition: A | Discharge status: Home / Self Care | Payer: HMO | Source: Ambulatory Visit | Attending: Radiation Oncology | Admitting: Radiation Oncology

## 2021-12-29 DIAGNOSIS — C61 Malignant neoplasm of prostate: Secondary | ICD-10-CM | POA: Diagnosis not present

## 2021-12-29 DIAGNOSIS — Z510 Encounter for antineoplastic radiation therapy: Secondary | ICD-10-CM | POA: Diagnosis not present

## 2021-12-30 ENCOUNTER — Ambulatory Visit
Admission: RE | Admit: 2021-12-30 | Discharge: 2021-12-30 | Disposition: A | Discharge status: Home / Self Care | Payer: HMO | Source: Ambulatory Visit | Attending: Radiation Oncology | Admitting: Radiation Oncology

## 2021-12-30 DIAGNOSIS — I25119 Atherosclerotic heart disease of native coronary artery with unspecified angina pectoris: Secondary | ICD-10-CM

## 2021-12-30 DIAGNOSIS — N183 Chronic kidney disease, stage 3 unspecified: Secondary | ICD-10-CM

## 2021-12-30 DIAGNOSIS — Z510 Encounter for antineoplastic radiation therapy: Secondary | ICD-10-CM | POA: Diagnosis not present

## 2021-12-30 DIAGNOSIS — I10 Essential (primary) hypertension: Secondary | ICD-10-CM

## 2021-12-30 DIAGNOSIS — C61 Malignant neoplasm of prostate: Secondary | ICD-10-CM | POA: Diagnosis not present

## 2021-12-31 ENCOUNTER — Ambulatory Visit
Admission: RE | Admit: 2021-12-31 | Discharge: 2021-12-31 | Disposition: A | Discharge status: Home / Self Care | Payer: HMO | Source: Ambulatory Visit | Attending: Radiation Oncology | Admitting: Radiation Oncology

## 2021-12-31 ENCOUNTER — Other Ambulatory Visit: Payer: Self-pay

## 2021-12-31 DIAGNOSIS — Z510 Encounter for antineoplastic radiation therapy: Secondary | ICD-10-CM | POA: Diagnosis not present

## 2021-12-31 DIAGNOSIS — C61 Malignant neoplasm of prostate: Secondary | ICD-10-CM | POA: Insufficient documentation

## 2021-12-31 DIAGNOSIS — N1832 Chronic kidney disease, stage 3b: Secondary | ICD-10-CM | POA: Diagnosis not present

## 2021-12-31 DIAGNOSIS — I129 Hypertensive chronic kidney disease with stage 1 through stage 4 chronic kidney disease, or unspecified chronic kidney disease: Secondary | ICD-10-CM | POA: Diagnosis not present

## 2022-01-01 ENCOUNTER — Ambulatory Visit
Admission: RE | Admit: 2022-01-01 | Discharge: 2022-01-01 | Disposition: A | Discharge status: Home / Self Care | Payer: HMO | Source: Ambulatory Visit | Attending: Radiation Oncology | Admitting: Radiation Oncology

## 2022-01-01 DIAGNOSIS — Z510 Encounter for antineoplastic radiation therapy: Secondary | ICD-10-CM | POA: Diagnosis not present

## 2022-01-01 DIAGNOSIS — C61 Malignant neoplasm of prostate: Secondary | ICD-10-CM | POA: Diagnosis not present

## 2022-01-02 ENCOUNTER — Encounter: Payer: Self-pay | Admitting: Urology

## 2022-01-02 ENCOUNTER — Other Ambulatory Visit: Payer: Self-pay

## 2022-01-02 ENCOUNTER — Ambulatory Visit
Admission: RE | Admit: 2022-01-02 | Discharge: 2022-01-02 | Disposition: A | Discharge status: Home / Self Care | Payer: HMO | Source: Ambulatory Visit | Attending: Radiation Oncology | Admitting: Radiation Oncology

## 2022-01-02 DIAGNOSIS — Z510 Encounter for antineoplastic radiation therapy: Secondary | ICD-10-CM | POA: Diagnosis not present

## 2022-01-02 DIAGNOSIS — C61 Malignant neoplasm of prostate: Secondary | ICD-10-CM

## 2022-01-06 ENCOUNTER — Ambulatory Visit
Admission: RE | Admit: 2022-01-06 | Discharge: 2022-01-06 | Disposition: A | Discharge status: Home / Self Care | Payer: HMO | Source: Ambulatory Visit | Attending: Internal Medicine | Admitting: Internal Medicine

## 2022-01-06 ENCOUNTER — Other Ambulatory Visit: Payer: Self-pay

## 2022-01-06 ENCOUNTER — Telehealth: Payer: Self-pay

## 2022-01-06 DIAGNOSIS — N1832 Chronic kidney disease, stage 3b: Secondary | ICD-10-CM | POA: Diagnosis not present

## 2022-01-06 DIAGNOSIS — I10 Essential (primary) hypertension: Secondary | ICD-10-CM | POA: Diagnosis not present

## 2022-01-06 DIAGNOSIS — C61 Malignant neoplasm of prostate: Secondary | ICD-10-CM

## 2022-01-06 DIAGNOSIS — N261 Atrophy of kidney (terminal): Secondary | ICD-10-CM

## 2022-01-06 DIAGNOSIS — I129 Hypertensive chronic kidney disease with stage 1 through stage 4 chronic kidney disease, or unspecified chronic kidney disease: Secondary | ICD-10-CM

## 2022-01-06 NOTE — Progress Notes (Unsigned)
° ° °Chronic Care Management °Pharmacy Assistant  ° °Name: Berthel Antonio Bally  MRN: [id number] DOB: 03/19/1946 ° °Harold Waters is an 76 y.o. year old male who presents for his follow-up CCM visit with the clinical pharmacist. ° °Reason for Encounter: Disease State °  °Conditions to be addressed/monitored: °HTN ° ° °Recent office visits:  °None ID ° °Recent consult visits:  °None ID ° °Hospital visits:  °None since last coordination Call ° °Medications: °Outpatient Encounter Medications as of 01/06/2022  °Medication Sig Note  ° amLODipine (NORVASC) 10 MG tablet TAKE 1 TABLET BY MOUTH EVERY DAY   ° aspirin EC 81 MG tablet Take 1 tablet (81 mg total) by mouth daily.   ° Blood Glucose Monitoring Suppl (ONETOUCH VERIO) w/Device KIT 1 Units by Does not apply route daily as needed.   ° Cholecalciferol (VITAMIN D3) 50 MCG (2000 UT) capsule Take 1 capsule (2,000 Units total) by mouth daily. (Patient not taking: Reported on 12/03/2021)   ° cloNIDine (CATAPRES - DOSED IN MG/24 HR) 0.1 mg/24hr patch Place 1 patch (0.1 mg total) onto the skin once a week.   ° cloNIDine (CATAPRES) 0.1 MG tablet Take 0.1 mg by mouth. (Patient not taking: Reported on 12/03/2021)   ° glucose blood (ONETOUCH VERIO) test strip Use to check blood sugar daily   ° hydrALAZINE (APRESOLINE) 100 MG tablet Take 1 tablet (100 mg total) by mouth 3 (three) times daily.   ° memantine (NAMENDA) 10 MG tablet Take 1 tablet (10 mg total) by mouth daily.   ° mirtazapine (REMERON) 15 MG tablet TAKE 1/2 TABLET EVERY NIGHT 06/26/2021: Hasn't started yet  ° Multiple Vitamins-Minerals (MENS MULTIVITAMIN PLUS) TABS Take 1 tablet by mouth at bedtime.  (Patient not taking: Reported on 12/03/2021)   ° nitroGLYCERIN (NITROSTAT) 0.4 MG SL tablet Place 1 tablet under tongue every 5 mins. DO NOT USE MORE THAN 3 TABS   ° polyethylene glycol powder (GLYCOLAX/MIRALAX) 17 GM/SCOOP powder Take 17 g by mouth 2 (two) times daily as needed for moderate constipation.   ° rosuvastatin  (CRESTOR) 40 MG tablet Take 1 tablet (40 mg total) by mouth daily. PATIENT MUST ATTEND FUTURE APPOINTMENT FOR FUTURE REFILLS   ° tamsulosin (FLOMAX) 0.4 MG CAPS capsule Take 1 capsule (0.4 mg total) by mouth daily after supper.   ° °No facility-administered encounter medications on file as of 01/06/2022.  ° °Reviewed chart prior to disease state call. Spoke with patient regarding BP ° °Recent Office Vitals: °BP Readings from Last 3 Encounters:  °12/01/21 (!) 152/70  °11/20/21 126/64  °11/07/21 (!) 159/75  ° °Pulse Readings from Last 3 Encounters:  °12/01/21 74  °11/20/21 81  °11/07/21 77  °  °Wt Readings from Last 3 Encounters:  °12/01/21 151 lb 3.2 oz (68.6 kg)  °11/20/21 155 lb 9.6 oz (70.6 kg)  °11/07/21 160 lb (72.6 kg)  °  ° °Kidney Function °Lab Results  °Component Value Date/Time  ° CREATININE 1.92 (H) 10/13/2021 03:04 PM  ° CREATININE 1.78 (H) 06/26/2021 10:02 AM  ° CREATININE 1.52 (H) 08/23/2020 04:39 PM  ° GFR 33.57 (L) 10/13/2021 03:04 PM  ° GFRNONAA 46 (L) 03/10/2021 07:59 PM  ° GFRAA 77 10/25/2019 11:36 AM  ° ° °BMP Latest Ref Rng & Units 10/13/2021 06/26/2021 03/10/2021  °Glucose 70 - 99 mg/dL 132(H) 112(H) 105(H)  °BUN 6 - 23 mg/dL 25(H) 23 27(H)  °Creatinine 0.40 - 1.50 mg/dL 1.92(H) 1.78(H) 1.57(H)  °BUN/Creat Ratio 6 - 22 (calc) - - -  °  Sodium 135 - 145 mEq/L 139 141 139  °Potassium 3.5 - 5.1 mEq/L 4.1 4.5 4.0  °Chloride 96 - 112 mEq/L 102 102 102  °CO2 19 - 32 mEq/L 30 32 29  °Calcium 8.4 - 10.5 mg/dL 9.1 9.5 9.1  ° ° °Current antihypertensive regimen:  °Clonidine 0.1mg patch - 1 patch once weekly  °Amlodipine 10mg daily  °Hydralazine 100mg 3 times daily  ° °How often are you checking your Blood Pressure? {CHL HP BP Monitoring Frequency:[id number]} ° °Current home BP readings: *** ° °What recent interventions/DTPs have been made by any provider to improve Blood Pressure control since last CPP Visit: *** ° °Any recent hospitalizations or ED visits since last visit with CPP? {yes/no:20286} ° °What diet  changes have been made to improve Blood Pressure Control?  °*** ° °What exercise is being done to improve your Blood Pressure Control?  °*** ° °Adherence Review: °Is the patient currently on ACE/ARB medication? No °Does the patient have >5 day gap between last estimated fill dates? No ° ° ° °Care Gaps: °Colonoscopy-NA °Diabetic Foot Exam-NA °Ophthalmology-NA °Dexa Scan - NA °Annual Well Visit - NA °Micro albumin-NA °Hemoglobin A1c- 12/01/21 ° °Star Rating Drugs: °Rosuvastatin 40 mg-last fill 10/27/21 90 ds ° ° Tracy Ellis,CMA °Clinical Pharmacist Assistant °[phone]  °

## 2022-01-09 ENCOUNTER — Telehealth: Payer: HMO

## 2022-01-09 NOTE — Progress Notes (Incomplete)
Chronic Care Management Pharmacy Note  Changes from nephro?  BP at home ***  CP 6 minutes ***   01/09/2022 Name:  Harold Waters MRN:  [id number] DOB:  27-Dec-1945  Summary: -Spoke with patient and his daughter [name] - reports that patient started radiation treatment 11/26/2021, started increased dose of hydralazine 12/01/2021 -Noted that since Monday patient has been experiencing chills throughout the day, also noted the low appetite and nausea (weight loss of ~9lbs since starting radiation treatment) -Has not yet established with nephrology - initial appointment scheduled for 12/10/2021 -Since starting increased hydralazine dose BP has not improved, remains elevated at 175/86, 187/86, 152/72 - similar readings to 52m dose  -Due to nausea has not been taking any of his vitamins, reports compliance with all other medications   Recommendations/Changes made from today's visit: -Recommending for patient to reduce hydralazine to 553mTID as BP has not been improved with high dose - possible DIL reaction, but higher likelihood of side effects from radiation being cause of chills / nausea -Daughter will reach out to oncology office about zofran prescription -Patient to continue to monitor blood pressure, should BP increase further will reach out to clinic  Subjective: Harold Waters an [age].o. year old male who is a primary patient of Plotnikov, AlEvie LacksMD.  The CCM team was consulted for assistance with disease management and care coordination needs.    Engaged with patient by telephone for follow up visit in response to provider referral for pharmacy case management and/or care coordination services.   Consent to Services:  The patient was given the following information about Chronic Care Management services today, agreed to services, and gave verbal consent: 1. CCM service includes personalized support from designated clinical staff supervised by the primary care provider,  including individualized plan of care and coordination with other care providers 2. 24/7 contact phone numbers for assistance for urgent and routine care needs. 3. Service will only be billed when office clinical staff spend 20 minutes or more in a month to coordinate care. 4. Only one practitioner may furnish and bill the service in a calendar month. 5.The patient may stop CCM services at any time (effective at the end of the month) by phone call to the office staff. 6. The patient will be responsible for cost sharing (co-pay) of up to 20% of the service fee (after annual deductible is met). Patient agreed to services and consent obtained.  Patient Care Team: Plotnikov, AlEvie LacksMD as PCP - General (Internal Medicine) CoSherren MochaMD as PCP - Cardiology (Cardiology) CrStanford BreedrDenice BorsMD as Consulting Physician (Cardiology) CaWaynetta SandyMD as Consulting Physician (Vascular Surgery) CoSherren MochaMD as Consulting Physician (Cardiology) AqCameron SprangMD as Consulting Physician (Neurology) DeMarlou SaGrTonna CornerMD as Consulting Physician (Orthopedic Surgery) StLadene ArtistMD as Consulting Physician (Gastroenterology) , DaDarnelle MaffucciRPClermont Ambulatory Surgical Centers Pharmacist (Pharmacist) LiKatheren PullerRN as Oncology Nurse Navigator MaTyler PitaMD as Consulting Physician (Radiation Oncology)  Recent office visits:  None since last visit    Recent consult visits:  None since last visit    Hospital visits:  None in previous 6 months  Objective:  Lab Results  Component Value Date   CREATININE 1.92 (H) 10/13/2021   BUN 25 (H) 10/13/2021   GFR 33.57 (L) 10/13/2021   GFRNONAA 46 (L) 03/10/2021   GFRAA 77 10/25/2019   NA 139 10/13/2021   K 4.1 10/13/2021   CALCIUM 9.1 10/13/2021  CO2 30 10/13/2021   GLUCOSE 132 (H) 10/13/2021    Lab Results  Component Value Date/Time   HGBA1C 7.0 (H) 10/13/2021 03:04 PM   HGBA1C 7.3 (H) 06/26/2021 10:02 AM   GFR 33.57 (L) 10/13/2021  03:04 PM   GFR 36.84 (L) 06/26/2021 10:02 AM    Last diabetic Eye exam:  No results found for: HMDIABEYEEXA  Last diabetic Foot exam:  No results found for: HMDIABFOOTEX   Lab Results  Component Value Date   CHOL 276 (H) 01/30/2021   HDL 49.30 01/30/2021   LDLCALC 198 (H) 01/30/2021   LDLDIRECT 167 (H) 10/25/2019   TRIG 145.0 01/30/2021   CHOLHDL 6 01/30/2021    Hepatic Function Latest Ref Rng & Units 10/13/2021 06/26/2021 01/30/2021  Total Protein 6.0 - 8.3 g/dL 6.6 6.6 6.3  Albumin 3.5 - 5.2 g/dL 4.0 4.1 4.2  AST 0 - 37 U/L _0 ALT 0 - 53 U/L _1 Alk Phosphatase 39 - 117 U/L 72 69 74  Total Bilirubin 0.2 - 1.2 mg/dL 0.4 0.7 0.5  Bilirubin, Direct 0.0 - 0.3 mg/dL - - 0.1    Lab Results  Component Value Date/Time   TSH 3.570 11/07/2021 10:41 AM   TSH 5.48 (H) 01/30/2021 08:01 AM   FREET4 1.26 11/07/2021 10:41 AM   FREET4 0.93 01/30/2021 08:01 AM    CBC Latest Ref Rng & Units 06/26/2021 03/10/2021 07/03/2019  WBC 4.0 - 10.5 K/uL 6.6 7.1 6.2  Hemoglobin 13.0 - 17.0 g/dL 13.4 13.4 13.8  Hematocrit 39.0 - 52.0 % 40.7 40.4 41.8  Platelets 150.0 - 400.0 K/uL 217.0 186 239.0    Lab Results  Component Value Date/Time   VD25OH 38.96 07/03/2019 11:34 AM    Clinical ASCVD: Yes  The ASCVD Risk score (Arnett DK, et al., 2019) failed to calculate for the following reasons:   The patient has a prior MI or stroke diagnosis    Depression screen [facility] 2/9 03/29/2020 03/05/2020 03/05/2020  Decreased Interest 0 0 (No Data)  Down, Depressed, Hopeless 0 0 -  PHQ - 2 Score 0 0 -  Some recent data might be hidden    Social History   Tobacco Use  Smoking Status Former   Packs/day: 1.00   Years: 40.00   Pack years: 40.00   Types: Cigarettes   Quit date: 04/03/2010   Years since quitting: 11.7  Smokeless Tobacco Never   BP Readings from Last 3 Encounters:  12/01/21 (!) 152/70  11/20/21 126/64  11/07/21 (!) 159/75   Pulse Readings from Last 3 Encounters:  12/01/21 74   11/20/21 81  11/07/21 77   Wt Readings from Last 3 Encounters:  12/01/21 151 lb 3.2 oz (68.6 kg)  11/20/21 155 lb 9.6 oz (70.6 kg)  11/07/21 160 lb (72.6 kg)   BMI Readings from Last 3 Encounters:  12/01/21 25.16 kg/m  11/20/21 25.89 kg/m  11/07/21 26.63 kg/m    Assessment/Interventions: Review of patient past medical history, allergies, medications, health status, including review of consultants reports, laboratory and other test data, was performed as part of comprehensive evaluation and provision of chronic care management services.   SDOH:  (Social Determinants of Health) assessments and interventions performed: Yes  SDOH Screenings   Alcohol Screen: Not on file  Depression (VAN1-9): Not on file  Financial Resource Strain: Low Risk    Difficulty of Paying Living Expenses: Not hard at all  Food Insecurity: Not on file  Housing: Not on file  Physical Activity: Not on file  Social Connections: Not on file  Stress: Not on file  Tobacco Use: Medium Risk   Smoking Tobacco Use: Former   Smokeless Tobacco Use: Never   Passive Exposure: Not on file  Transportation Needs: Not on file    CCM Care Plan  Allergies  Allergen Reactions   Eggs Or Egg-Derived Products Itching   Aricept [Donepezil] Nausea And Vomiting    n/v   Coreg [Carvedilol] Other (See Comments)    fatigue    Medications Reviewed Today     Reviewed by Tomasa Blase, [facility] (Pharmacist) on 12/03/21 at 1336  Med List Status: <None>   Medication Order Taking? Sig Documenting Provider Last Dose Status Informant  amLODipine (NORVASC) 10 MG tablet [id number] Yes Take 1 tablet (10 mg total) by mouth daily. Hoyt Koch, MD Taking Active   aspirin EC 81 MG tablet [id number] Yes Take 1 tablet (81 mg total) by mouth daily. Plotnikov, [name], MD Taking Active   Blood Glucose Monitoring Suppl [facility] VERIO) w/Device KIT [id number]  1 Units by Does not apply route daily as needed. Plotnikov, [name],  MD  Active   Cholecalciferol (VITAMIN D3) 50 MCG (2000 UT) capsule [id number] No Take 1 capsule (2,000 Units total) by mouth daily.  Patient not taking: Reported on 12/03/2021   Plotnikov, [name], MD Not Taking Active   cloNIDine (CATAPRES - DOSED IN MG/24 HR) 0.1 mg/24hr patch [id number] Yes Place 1 patch (0.1 mg total) onto the skin once a week. Plotnikov, [name], MD Taking Active   cloNIDine (CATAPRES) 0.1 MG tablet [id number] No Take 0.1 mg by mouth.  Patient not taking: Reported on 12/03/2021   [provider] Not Taking Active   glucose blood (ONETOUCH VERIO) test strip [id number]  Use to check blood sugar daily Plotnikov, [name], MD  Active   hydrALAZINE (APRESOLINE) 100 MG tablet [id number] Yes Take 1 tablet (100 mg total) by mouth 3 (three) times daily. Plotnikov, [name], MD Taking Active   memantine (NAMENDA) 10 MG tablet [id number] Yes Take 1 tablet (10 mg total) by mouth daily. Rondel Jumbo, PA-C Taking Active   mirtazapine (REMERON) 15 MG tablet [id number]  TAKE 1/2 TABLET EVERY NIGHT Cameron Sprang, MD  Active            Med Note Thomes Cake   Thu Jun 26, 2021  9:38 AM) Hasn't started yet  Multiple Vitamins-Minerals (MENS MULTIVITAMIN PLUS) TABS [id number] No Take 1 tablet by mouth at bedtime.   Patient not taking: Reported on 12/03/2021   [provider] Not Taking Active Self  nitroGLYCERIN (NITROSTAT) 0.4 MG SL tablet [id number] Yes Place 1 tablet under tongue every 5 mins. DO NOT USE MORE THAN 3 TABS Plotnikov, [name], MD Taking Active   polyethylene glycol powder (GLYCOLAX/MIRALAX) 17 GM/SCOOP powder [id number] Yes Take 17 g by mouth 2 (two) times daily as needed for moderate constipation. Plotnikov, [name], MD Taking Active   rosuvastatin (CRESTOR) 40 MG tablet [id number] Yes Take 1 tablet (40 mg total) by mouth daily. PATIENT MUST ATTEND FUTURE APPOINTMENT FOR FUTURE REFILLS Deberah Pelton, NP Taking Active             Patient Active  Problem List   Diagnosis Date Noted   Malignant neoplasm of prostate ([place]) 10/10/2021   Weight loss, unintentional 06/26/2021   COVID-19 03/11/2021   Prostate cancer ([place]) 01/31/2021   CRI (chronic renal insufficiency), stage 3 (moderate) (  [place]) 01/31/2021   LLQ abdominal pain 01/29/2021   Constipation 01/29/2021   Distorted vision 08/23/2020   Urinary frequency 08/23/2020   Knee pain, bilateral 05/22/2020   Fatigue 09/05/2019   Insomnia 07/03/2019   Memory loss 07/03/2019   Vitamin D deficiency, unspecified 12/12/2018   Other hemorrhoids 12/12/2018   Personal history of other venous thrombosis and embolism 12/12/2018   Muscle weakness (generalized) 12/12/2018   Long term (current) use of aspirin 12/12/2018   Hyperglycemia, unspecified 12/12/2018   Diverticulosis of intestine, part unspecified, without perforation or abscess without bleeding 12/12/2018   Difficulty in walking, not elsewhere classified 12/12/2018   Deficiency of other specified B group vitamins 12/12/2018   Pre-operative clearance 11/25/2018   PAD (peripheral artery disease) ([place]) 10/18/2018   Chest pain 09/20/2018   Claudication in peripheral vascular disease ([place]) 04/22/2017   Pain in joint, shoulder region 06/22/2016   Diabetes mellitus type 2 with complications ([place]) 19/41/7408   Well adult exam 12/19/2014   Migraine headache with aura 01/27/2013   CAD (coronary artery disease) 10/03/2012   Actinic keratosis 09/06/2012   Unstable angina ([facility]) 02/18/2012   PSVT (paroxysmal supraventricular tachycardia) ([place]) 02/18/2012   CAP (community acquired pneumonia) 10/12/2011   Atherosclerosis of native artery of extremity with intermittent claudication ([place]) 06/02/2010   CAROTID ARTERY DISEASE 04/23/2010   OSTEOARTHRITIS 02/27/2010   Arthralgia 02/27/2010   LOW BACK PAIN 02/27/2010   PHARYNGITIS, ACUTE 06/18/2009   DYSPNEA 04/03/2009   ACUTE BRONCHITIS 02/12/2009   VENOUS INSUFFICIENCY 12/19/2008   EDEMA  12/19/2008   GERD 02/20/2008   Abdominal pain 02/20/2008   Neoplasm of uncertain behavior of skin 10/18/2007   COUGH 10/18/2007   Dyslipidemia 09/07/2007   Uncontrolled hypertension 09/07/2007   MYOCARDIAL INFARCTION 09/07/2007   CHEST PAIN, ATYPICAL, HX OF 09/07/2007   APPENDECTOMY, HX OF 09/07/2007   PERCUTANEOUS TRANSLUMINAL CORONARY ANGIOPLASTY, HX OF 09/07/2007   ARTHROSCOPY, KNEE, HX OF 09/07/2007    Immunization History  Administered Date(s) Administered   Fluad Quad(high Dose 65+) 09/05/2019   Hepatitis B 09/01/2007, 10/18/2007, 04/23/2008   Influenza Whole 08/20/2008   Pneumococcal Conjugate-13 06/22/2016   Pneumococcal Polysaccharide-23 12/19/2014   Td 12/19/2014    Conditions to be addressed/monitored:  Hypertension, Hyperlipidemia, Coronary Artery Disease, and Chronic Kidney Disease  There are no care plans that you recently modified to display for this patient.      Medication Assistance: None required.  Patient affirms current coverage meets needs.  Care Gaps: Colonoscopy, Foot exam, Hep C screening, ophthalmology exam  Patient's preferred pharmacy is:  [facility] [place], [place] [address] Phone: [phone] Fax: [phone]   Uses pill box? Yes Pt endorses 70-80% compliance  Care Plan and Follow Up Patient Decision:  Patient agrees to Care Plan and Follow-up.  Plan: Telephone follow up appointment with care management team member scheduled for:  2 months The patient has been provided with contact information for the care management team and has been advised to call with any health related questions or concerns.   Tomasa Blase, PharmD Clinical Pharmacist, [place]

## 2022-01-14 DIAGNOSIS — N1832 Chronic kidney disease, stage 3b: Secondary | ICD-10-CM | POA: Diagnosis not present

## 2022-01-15 DIAGNOSIS — I70212 Atherosclerosis of native arteries of extremities with intermittent claudication, left leg: Secondary | ICD-10-CM | POA: Diagnosis not present

## 2022-01-15 DIAGNOSIS — M1990 Unspecified osteoarthritis, unspecified site: Secondary | ICD-10-CM | POA: Diagnosis not present

## 2022-01-15 DIAGNOSIS — G4733 Obstructive sleep apnea (adult) (pediatric): Secondary | ICD-10-CM | POA: Diagnosis not present

## 2022-01-15 DIAGNOSIS — Z48812 Encounter for surgical aftercare following surgery on the circulatory system: Secondary | ICD-10-CM | POA: Diagnosis not present

## 2022-01-15 DIAGNOSIS — Z86718 Personal history of other venous thrombosis and embolism: Secondary | ICD-10-CM | POA: Diagnosis not present

## 2022-01-21 ENCOUNTER — Other Ambulatory Visit: Payer: Self-pay | Admitting: Internal Medicine

## 2022-01-21 ENCOUNTER — Other Ambulatory Visit: Payer: Self-pay | Admitting: General Practice

## 2022-02-03 DIAGNOSIS — D485 Neoplasm of uncertain behavior of skin: Secondary | ICD-10-CM | POA: Diagnosis not present

## 2022-02-03 DIAGNOSIS — C44319 Basal cell carcinoma of skin of other parts of face: Secondary | ICD-10-CM | POA: Diagnosis not present

## 2022-02-04 ENCOUNTER — Encounter: Payer: Self-pay | Admitting: Urology

## 2022-02-04 ENCOUNTER — Ambulatory Visit
Admission: RE | Admit: 2022-02-04 | Discharge: 2022-02-04 | Disposition: A | Discharge status: Home / Self Care | Payer: HMO | Source: Ambulatory Visit | Attending: Urology | Admitting: Urology

## 2022-02-04 ENCOUNTER — Telehealth: Payer: Self-pay

## 2022-02-04 DIAGNOSIS — C61 Malignant neoplasm of prostate: Secondary | ICD-10-CM | POA: Insufficient documentation

## 2022-02-04 DIAGNOSIS — N1832 Chronic kidney disease, stage 3b: Secondary | ICD-10-CM | POA: Diagnosis not present

## 2022-02-04 NOTE — Telephone Encounter (Signed)
Called patient x3 in reference to his 10:00am-02/04/22 telephone appointment w/ Ashlyn Bruning PA-C. I left my extension [phone] and requested that patient return my call prior to his appointment time, so that I may complete the "nursing" portion of this appointment.  ?

## 2022-02-04 NOTE — Progress Notes (Signed)
Telephone follow-up appointment. I called patient x3 w/ no answer. I called his daughter Zebedee Iba, verified her identity, and began nursing interview. She reports that patient Mr. Harold Waters is having some occasional dysuria and a weakened urine stream. No other issues reported to me at this time. I left my extension [phone] in case patient needs anything. ? ?Meaningful use complete. ?I-PSS score of 9 (mild). ?Flomax 0.'4mg'$  as directed. ?Urology appointment- None ? ?10:00am-02/04/22 telephone appointment w/ Freeman Caldron PA-C has been completed w/ daughter Zebedee Iba. ?

## 2022-02-04 NOTE — Progress Notes (Signed)
?  Radiation Oncology         [phone] ?________________________________ ? ?Name: Harold Waters MRN: [id number]  ?Date: 01/02/2022  DOB: April 05, 1946 ? ?End of Treatment Note ? ?Diagnosis:   76 y.o. gentleman with Stage T1c adenocarcinoma of the prostate with Gleason score of 3+4, and PSA of 14.5.    ? ?Indication for treatment:  Curative, Definitive Radiotherapy      ? ?Radiation treatment dates:   11/26/21 - 01/02/22 ? ?Site/dose:   The prostate was treated to 70 Gy in 28 fractions of 2.5 Gy ? ?Beams/energy:   The patient was treated with IMRT using volumetric arc therapy delivering 6 MV X-rays to clockwise and counterclockwise circumferential arcs with a 90 degree collimator offset to avoid dose scalloping.  Image guidance was performed with daily cone beam CT prior to each fraction to align to gold markers in the prostate and assure proper bladder and rectal fill volumes.  Immobilization was achieved with BodyFix custom mold. ? ?Narrative: The patient tolerated radiation treatment relatively well with only minor urinary irritation and modest fatigue.  He did report some mild dysuria which improved with AZO as needed.  He also experienced increased nocturia, frequency and a weaker flow of stream that improved with Flomax daily.  He denied any abdominal pain, nausea, vomiting, diarrhea or constipation. ? ?Plan: The patient has completed radiation treatment. He will return to radiation oncology clinic for routine followup in one month. I advised him to call or return sooner if he has any questions or concerns related to his recovery or treatment. ?________________________________ ? ?Sheral Apley Tammi Klippel, M.D.  ?

## 2022-02-04 NOTE — Progress Notes (Signed)
?Radiation Oncology         [phone] ?________________________________ ? ?Name: Harold Waters MRN: [id number]  ?Date: 02/04/2022  DOB: [birth year] ? ?Post Treatment Note ? ?CC: Harold Waters, Harold Lacks, MD  Ardis Hughs, MD ? ?Diagnosis:   76 y.o. gentleman with Stage T1c adenocarcinoma of the prostate with Gleason score of 3+4, and PSA of 14.5. ? ?Interval Since Last Radiation:  4 weeks  ?11/26/21 - 01/02/22:  The prostate was treated to 70 Gy in 28 fractions of 2.5 Gy ? ? ?Narrative:  I spoke with the patient and his daughter, Harold Waters, to conduct the routine scheduled 1 month follow up visit via telephone to spare the patient unnecessary potential exposure in the healthcare setting during the current COVID-19 pandemic.  The patient was notified in advance and gave permission to proceed with this visit format. ? ?He tolerated radiation treatment relatively well with only minor urinary irritation and modest fatigue.  He did report some mild dysuria which improved with AZO as needed.  He also experienced increased nocturia, frequency and a weaker flow of stream that improved with Flomax daily.  He denied any abdominal pain, nausea, vomiting, diarrhea or constipation. ?                              ? ?On review of systems, the patient states that he is doing well in general.  He continues with a weaker flow of stream, increased frequency and nocturia but denies dysuria, straining to void, incomplete bladder emptying or incontinence.  He is continue taking Flomax daily as prescribed but no longer requiring the AZO.  He feels like his LUTS are gradually improving and are tolerable at this point.  He has not had any abdominal pain, nausea, vomiting, diarrhea or constipation and has continued with a healthy appetite.  He is having some issues with erectile dysfunction which is likely secondary to his anxiety associated with his cancer diagnosis.  His daughter reports that he has seemed quite anxious, really since  the time of his diagnosis, but otherwise, is without complaints.  He continues with mild fatigue but has been able to remain active and has resumed his normal duties at work.  Overall, he is pleased with his progress to date. ? ?ALLERGIES:  is allergic to eggs or egg-derived products, aricept [donepezil], and coreg [carvedilol]. ? ?Meds: ?Current Outpatient Medications  ?Medication Sig Dispense Refill  ? amLODipine (NORVASC) 10 MG tablet TAKE 1 TABLET BY MOUTH EVERY DAY 90 tablet 1  ? aspirin EC 81 MG tablet Take 1 tablet (81 mg total) by mouth daily. 100 tablet 3  ? Blood Glucose Monitoring Suppl (ONETOUCH VERIO) w/Device KIT 1 Units by Does not apply route daily as needed. 1 kit 1  ? Cholecalciferol (VITAMIN D3) 50 MCG (2000 UT) capsule Take 1 capsule (2,000 Units total) by mouth daily. (Patient not taking: Reported on 12/03/2021) 100 capsule 3  ? cloNIDine (CATAPRES - DOSED IN MG/24 HR) 0.1 mg/24hr patch Place 1 patch (0.1 mg total) onto the skin once a week. 4 patch 12  ? cloNIDine (CATAPRES) 0.1 MG tablet TAKE 1 TABLET BY MOUTH 2-3 TIMES DAILY 270 tablet 3  ? glucose blood (ONETOUCH VERIO) test strip Use to check blood sugar daily 50 each 11  ? hydrALAZINE (APRESOLINE) 100 MG tablet Take 1 tablet (100 mg total) by mouth 3 (three) times daily. 90 tablet 11  ? memantine (NAMENDA) 10 MG  tablet Take 1 tablet (10 mg total) by mouth daily. 90 tablet 3  ? mirtazapine (REMERON) 15 MG tablet TAKE 1/2 TABLET EVERY NIGHT 45 tablet 4  ? Multiple Vitamins-Minerals (MENS MULTIVITAMIN PLUS) TABS Take 1 tablet by mouth at bedtime.  (Patient not taking: Reported on 12/03/2021)    ? nitroGLYCERIN (NITROSTAT) 0.4 MG SL tablet Place 1 tablet under tongue every 5 mins. DO NOT USE MORE THAN 3 TABS 25 tablet 5  ? polyethylene glycol powder (GLYCOLAX/MIRALAX) 17 GM/SCOOP powder Take 17 g by mouth 2 (two) times daily as needed for moderate constipation. 500 g 3  ? rosuvastatin (CRESTOR) 40 MG tablet Take 1 tablet (40 mg total) by mouth  daily. PATIENT MUST ATTEND FUTURE APPOINTMENT FOR FUTURE REFILLS 90 tablet 1  ? tamsulosin (FLOMAX) 0.4 MG CAPS capsule Take 1 capsule (0.4 mg total) by mouth daily after supper. 30 capsule 5  ? ?No current facility-administered medications for this encounter.  ? ? ?Physical Findings: ? vitals were not taken for this visit.  ? /10 ?Unable to assess due to telephone follow-up visit format. ? ?Lab Findings: ?Lab Results  ?Component Value Date  ? WBC 6.6 06/26/2021  ? HGB 13.4 06/26/2021  ? HCT 40.7 06/26/2021  ? MCV 86.0 06/26/2021  ? PLT 217.0 06/26/2021  ? ? ? ?Radiographic Findings: ?US RENAL ARTERY DUPLEX COMPLETE ? ?Result Date: 01/07/2022 ?CLINICAL DATA:  Stage III B chronic kidney disease and hypertension. EXAM: RENAL/URINARY TRACT ULTRASOUND RENAL DUPLEX DOPPLER ULTRASOUND COMPARISON:  None. FINDINGS: Right Kidney: Length: 8.6 x 4.4 x 3.8 cm. Estimated volume of 75 mL. Mildly increased cortical echogenicity. No mass or hydronephrosis visualized. Left Kidney: Length: 10.6 x 6.0 x 4.3 cm. Estimated volume of 142 mL. Increased cortical echogenicity. No mass or hydronephrosis visualized. Bladder:  Mildly distended.  Unremarkable in appearance. RENAL DUPLEX ULTRASOUND Right Renal Artery Velocities: Origin:  87 cm/sec Mid:  49 cm/sec Hilum:  40 cm/sec Interlobar:  16 cm/sec Arcuate:  10 cm/sec Left Renal Artery Velocities: Origin:  75 cm/sec Mid:  75 cm/sec Hilum:  67 cm/sec Interlobar:  28 cm/sec Arcuate:  21 cm/sec Aortic Velocity:  116 cm/sec Right Renal-Aortic Ratios: Origin: 0.8 Mid:  0.4 Hilum: 0.3 Interlobar: 0.1 Arcuate: 0.1 Left Renal-Aortic Ratios: Origin: 0.6 Mid: 0.6 Hilum: 0.6 Interlobar: 0.2 Arcuate: 0.2 Renal artery waveforms are normal bilaterally. Bilateral renal veins are normally patent. IMPRESSION: 1. Smaller right kidney relative to the left by volume. Both kidneys demonstrate mildly increased cortical echogenicity consistent with chronic kidney disease. No evidence of hydronephrosis bilaterally.  2. Unremarkable renal duplex evaluation demonstrating no evidence to suggest significant renal artery stenosis bilaterally. Electronically Signed   By: Aletta Edouard M.D.   On: 01/07/2022 09:38   ? ?Impression/Plan: ?1. 76 y.o. gentleman with Stage T1c adenocarcinoma of the prostate with Gleason score of 3+4, and PSA of 14.5. ?He will continue to follow up with urology for ongoing PSA determinations but does not currently have any follow-up appointments scheduled with Dr. Louis Meckel to his knowledge. He understands what to expect with regards to PSA monitoring going forward. I will look forward to following his response to treatment via correspondence with urology, and would be happy to continue to participate in his care if clinically indicated. I talked to the patient about what to expect in the future, including his risk for erectile dysfunction and rectal bleeding. I encouraged him to call or return to the office if he has any questions regarding his previous radiation or possible radiation  side effects. He was comfortable with this plan and will follow up as needed.  ? ? ? ?Nicholos Johns, PA-C  ?

## 2022-02-15 DIAGNOSIS — G4733 Obstructive sleep apnea (adult) (pediatric): Secondary | ICD-10-CM | POA: Diagnosis not present

## 2022-02-15 DIAGNOSIS — Z86718 Personal history of other venous thrombosis and embolism: Secondary | ICD-10-CM | POA: Diagnosis not present

## 2022-02-15 DIAGNOSIS — M1990 Unspecified osteoarthritis, unspecified site: Secondary | ICD-10-CM | POA: Diagnosis not present

## 2022-02-15 DIAGNOSIS — I70212 Atherosclerosis of native arteries of extremities with intermittent claudication, left leg: Secondary | ICD-10-CM | POA: Diagnosis not present

## 2022-02-15 DIAGNOSIS — Z48812 Encounter for surgical aftercare following surgery on the circulatory system: Secondary | ICD-10-CM | POA: Diagnosis not present

## 2022-02-17 ENCOUNTER — Telehealth: Payer: Self-pay

## 2022-02-17 NOTE — Progress Notes (Signed)
? ? ?Chronic Care Management ?Pharmacy Assistant  ? ?Name: Harold Waters  MRN: [id number] DOB: January 13, 1946 ? ? ?Reason for Encounter: Disease State ?  ?Conditions to be addressed/monitored: ?HTN ? ? ?Recent office visits:  ?None ID ? ?Recent consult visits:  ?None ID ? ?Hospital visits:  ?None since last coordination call ? ?Medications: ?Outpatient Encounter Medications as of 02/17/2022  ?Medication Sig Note  ? amLODipine (NORVASC) 10 MG tablet TAKE 1 TABLET BY MOUTH EVERY DAY   ? aspirin EC 81 MG tablet Take 1 tablet (81 mg total) by mouth daily.   ? Blood Glucose Monitoring Suppl (ONETOUCH VERIO) w/Device KIT 1 Units by Does not apply route daily as needed.   ? Cholecalciferol (VITAMIN D3) 50 MCG (2000 UT) capsule Take 1 capsule (2,000 Units total) by mouth daily. (Patient not taking: Reported on 12/03/2021)   ? cloNIDine (CATAPRES - DOSED IN MG/24 HR) 0.1 mg/24hr patch Place 1 patch (0.1 mg total) onto the skin once a week.   ? cloNIDine (CATAPRES) 0.1 MG tablet TAKE 1 TABLET BY MOUTH 2-3 TIMES DAILY   ? glucose blood (ONETOUCH VERIO) test strip Use to check blood sugar daily   ? hydrALAZINE (APRESOLINE) 100 MG tablet Take 1 tablet (100 mg total) by mouth 3 (three) times daily.   ? memantine (NAMENDA) 10 MG tablet Take 1 tablet (10 mg total) by mouth daily.   ? mirtazapine (REMERON) 15 MG tablet TAKE 1/2 TABLET EVERY NIGHT 06/26/2021: Hasn't started yet  ? Multiple Vitamins-Minerals (MENS MULTIVITAMIN PLUS) TABS Take 1 tablet by mouth at bedtime.  (Patient not taking: Reported on 12/03/2021)   ? nitroGLYCERIN (NITROSTAT) 0.4 MG SL tablet Place 1 tablet under tongue every 5 mins. DO NOT USE MORE THAN 3 TABS   ? olmesartan (BENICAR) 5 MG tablet Take 5 mg by mouth daily.   ? polyethylene glycol powder (GLYCOLAX/MIRALAX) 17 GM/SCOOP powder Take 17 g by mouth 2 (two) times daily as needed for moderate constipation.   ? rosuvastatin (CRESTOR) 40 MG tablet Take 1 tablet (40 mg total) by mouth daily. PATIENT MUST ATTEND  FUTURE APPOINTMENT FOR FUTURE REFILLS   ? tamsulosin (FLOMAX) 0.4 MG CAPS capsule Take 1 capsule (0.4 mg total) by mouth daily after supper.   ? ?No facility-administered encounter medications on file as of 02/17/2022.  ? ?Reviewed chart prior to disease state call. Spoke with patient regarding BP ? ?Recent Office Vitals: ?BP Readings from Last 3 Encounters:  ?12/01/21 (!) 152/70  ?11/20/21 126/64  ?11/07/21 (!) 159/75  ? ?Pulse Readings from Last 3 Encounters:  ?12/01/21 74  ?11/20/21 81  ?11/07/21 77  ?  ?Wt Readings from Last 3 Encounters:  ?12/01/21 151 lb 3.2 oz (68.6 kg)  ?11/20/21 155 lb 9.6 oz (70.6 kg)  ?11/07/21 160 lb (72.6 kg)  ?  ? ?Kidney Function ?Lab Results  ?Component Value Date/Time  ? CREATININE 1.92 (H) 10/13/2021 03:04 PM  ? CREATININE 1.78 (H) 06/26/2021 10:02 AM  ? CREATININE 1.52 (H) 08/23/2020 04:39 PM  ? GFR 33.57 (L) 10/13/2021 03:04 PM  ? GFRNONAA 46 (L) 03/10/2021 07:59 PM  ? GFRAA 77 10/25/2019 11:36 AM  ? ? ? ?  Latest Ref Rng & Units 10/13/2021  ?  3:04 PM 06/26/2021  ? 10:02 AM 03/10/2021  ?  7:59 PM  ?BMP  ?Glucose 70 - 99 mg/dL 132   112   105    ?BUN 6 - 23 mg/dL _0 ?  Creatinine 0.40 - 1.50 mg/dL 1.92   1.78   1.57    ?Sodium 135 - 145 mEq/L 139   141   139    ?Potassium 3.5 - 5.1 mEq/L 4.1   4.5   4.0    ?Chloride 96 - 112 mEq/L 102   102   102    ?CO2 19 - 32 mEq/L 30   32   29    ?Calcium 8.4 - 10.5 mg/dL 9.1   9.5   9.1    ? ?Reviewed chart prior to disease state call. Spoke with patient regarding BP ? ?Current antihypertensive regimen:  ?Clonidine 0.84m patch - 1 patch once weekly (Patient taken off patch on Monday) ?Amlodipine 165mdaily  ?Hydralazine 10037m times daily  ? ?How often are you checking your Blood Pressure?  Spoke with patient daughter BonHorris Latinoo states that she usually goes by and take his blood pressure when she can. She mentioned that lately his blood pressure has been dropping . One day it was 117/90 and the the next it was 90/50. She did say that  he was started on another medication but not sure what it is, will call back with name and a blood pressure reading  ? ?Current home BP readings: na (daughter was suppose to call back with bp reading) last recorded BP was 126/64 on 11/20/21 ? ?What recent interventions/DTPs have been made by any provider to improve Blood Pressure control since last CPP Visit: None noted since last coordination call ? ?Any recent hospitalizations or ED visits since last visit with CPP? No, not since last coordination call ? ?What diet changes have been made to improve Blood Pressure Control?  ?She states that patient is eating more lean meats and vegetables ? ?What exercise is being done to improve your Blood Pressure Control?  ?She states that patient walks daily ? ?Adherence Review: ?Is the patient currently on ACE/ARB medication? No ?Does the patient have >5 day gap between last estimated fill dates? No ? ? ?Care Gaps: ?Colonoscopy-NA ?Diabetic Foot Exam-NA ?Ophthalmology-NA ?Dexa Scan - NA ?Annual Well Visit - NA ?Micro albumin-NA ?Hemoglobin A1c- 12/01/21 ?  ?Star Rating Drugs: ?Rosuvastatin 40 mg-last fill 10/27/21 90 ds ?  ? TraEthelene Hallinical Pharmacist Assistant ?[phone]

## 2022-02-23 DIAGNOSIS — C61 Malignant neoplasm of prostate: Secondary | ICD-10-CM | POA: Diagnosis not present

## 2022-02-24 DIAGNOSIS — N1832 Chronic kidney disease, stage 3b: Secondary | ICD-10-CM | POA: Diagnosis not present

## 2022-02-24 DIAGNOSIS — C44319 Basal cell carcinoma of skin of other parts of face: Secondary | ICD-10-CM | POA: Diagnosis not present

## 2022-02-27 ENCOUNTER — Other Ambulatory Visit: Payer: Self-pay | Admitting: General Practice

## 2022-02-27 ENCOUNTER — Ambulatory Visit: Payer: HMO

## 2022-03-19 ENCOUNTER — Telehealth: Payer: Self-pay

## 2022-03-19 NOTE — Progress Notes (Signed)
Chronic Care Management Pharmacy Assistant   Name: Armon Orvis  MRN: [id number] DOB: Nov 29, 1945  Reason for Encounter: Disease State-Adherence    Recent office visits:  None since the last coordination call 02/17/22  Recent consult visits:  None since the last coordination call 02/17/22  Hospital visits:  None since the last coordination call 02/17/22  Medications: Outpatient Encounter Medications as of 03/19/2022  Medication Sig Note   amLODipine (NORVASC) 10 MG tablet TAKE 1 TABLET BY MOUTH EVERY DAY    aspirin EC 81 MG tablet Take 1 tablet (81 mg total) by mouth daily.    Blood Glucose Monitoring Suppl (ONETOUCH VERIO) w/Device KIT 1 Units by Does not apply route daily as needed.    Cholecalciferol (VITAMIN D3) 50 MCG (2000 UT) capsule Take 1 capsule (2,000 Units total) by mouth daily. (Patient not taking: Reported on 12/03/2021)    cloNIDine (CATAPRES - DOSED IN MG/24 HR) 0.1 mg/24hr patch Place 1 patch (0.1 mg total) onto the skin once a week.    cloNIDine (CATAPRES) 0.1 MG tablet TAKE 1 TABLET BY MOUTH 2-3 TIMES DAILY    glucose blood (ONETOUCH VERIO) test strip Use to check blood sugar daily    hydrALAZINE (APRESOLINE) 100 MG tablet Take 1 tablet (100 mg total) by mouth 3 (three) times daily.    memantine (NAMENDA) 10 MG tablet Take 1 tablet (10 mg total) by mouth daily.    mirtazapine (REMERON) 15 MG tablet TAKE 1/2 TABLET EVERY NIGHT 06/26/2021: Hasn't started yet   Multiple Vitamins-Minerals (MENS MULTIVITAMIN PLUS) TABS Take 1 tablet by mouth at bedtime.  (Patient not taking: Reported on 12/03/2021)    nitroGLYCERIN (NITROSTAT) 0.4 MG SL tablet Place 1 tablet under tongue every 5 mins. DO NOT USE MORE THAN 3 TABS    olmesartan (BENICAR) 5 MG tablet Take 5 mg by mouth daily.    polyethylene glycol powder (GLYCOLAX/MIRALAX) 17 GM/SCOOP powder Take 17 g by mouth 2 (two) times daily as needed for moderate constipation.    rosuvastatin (CRESTOR) 40 MG tablet TAKE 1 TABLET  (40 MG TOTAL) BY MOUTH DAILY.    tamsulosin (FLOMAX) 0.4 MG CAPS capsule Take 1 capsule (0.4 mg total) by mouth daily after supper.    No facility-administered encounter medications on file as of 03/19/2022.   [place] [employer] Review Call   Chart Review:  Have there been any documented new, changed, or discontinued medications since last visit? No (If yes, include name, dose, frequency, date) Has there been any documented recent hospitalizations or ED visits since last visit with Clinical Pharmacist? No Brief Summary (including medication and/or Diagnosis changes):   Adherence Review:  Does the Clinical Pharmacist Assistant have access to adherence rates? Yes Adherence rates for STAR metric medications (List medication(s)/day supply/ last 2 fill dates). Adherence rates for medications indicated for disease state being reviewed (List medication(s)/day supply/ last 2 fill dates). Does the patient have >5 day gap between last estimated fill dates for any of the above medications or other medication gaps? No Reason for medication gaps.   Disease State Questions:  Able to connect with Patient? Yes,spoke with patient daughter [name].   Did patient have any problems with their health recently? No Note problems and Concerns:  Have you had any admissions or emergency room visits or worsening of your condition(s) since last visit? No Details of ED visit, hospital visit and/or worsening condition(s):  Have you had any visits with new specialists or providers since  your last visit? No Explain:  Have you had any new health care problem(s) since your last visit? No New problem(s) reported:  Have you run out of any of your medications since you last spoke with clinical pharmacist? No What caused you to run out of your medications?  Are there any medications you are not taking as prescribed? No What kept you from taking your medications as prescribed?  Are you  having any issues or side effects with your medications? No Note of issues or side effects:  Do you have any other health concerns or questions you want to discuss with your Clinical Pharmacist before your next visit? No, but she states that patient is doing well, he is traveling at this time and that she talks to him weekly and he is doing well and blood pressure is normal. Note additional concerns and questions from Patient.  Are there any health concerns that you feel we can do a better job addressing? No Note Patient's response.  Are you having any problems with any of the following since the last visit: (select all that apply)  None  Details:  12. Any falls since last visit? No  Details: 13. Any increased or uncontrolled pain since last visit? No  Details:  14. Next visit Type: telephone       Visit with:Clinical Pharmacist        Date:06/03/22        Time:1:45 pm  15. Additional Details? No      Care Gaps: Colonoscopy-NA Diabetic Foot Exam-NA Ophthalmology-NA Dexa Scan - NA Annual Well Visit - NA Micro albumin-NA Hemoglobin A1c- 10/13/21   Star Rating Drugs: Rosuvastatin 40 mg-last fill 10/27/21 90 ds    Ethelene Hal Clinical Pharmacist Assistant [phone]

## 2022-04-06 DIAGNOSIS — R3912 Poor urinary stream: Secondary | ICD-10-CM | POA: Diagnosis not present

## 2022-04-06 DIAGNOSIS — G4733 Obstructive sleep apnea (adult) (pediatric): Secondary | ICD-10-CM | POA: Diagnosis not present

## 2022-04-06 DIAGNOSIS — C61 Malignant neoplasm of prostate: Secondary | ICD-10-CM | POA: Diagnosis not present

## 2022-04-06 DIAGNOSIS — R300 Dysuria: Secondary | ICD-10-CM | POA: Diagnosis not present

## 2022-04-06 DIAGNOSIS — R3911 Hesitancy of micturition: Secondary | ICD-10-CM | POA: Diagnosis not present

## 2022-04-07 DIAGNOSIS — I739 Peripheral vascular disease, unspecified: Secondary | ICD-10-CM | POA: Diagnosis not present

## 2022-04-07 DIAGNOSIS — N261 Atrophy of kidney (terminal): Secondary | ICD-10-CM | POA: Diagnosis not present

## 2022-04-07 DIAGNOSIS — N1832 Chronic kidney disease, stage 3b: Secondary | ICD-10-CM | POA: Diagnosis not present

## 2022-04-07 DIAGNOSIS — E1122 Type 2 diabetes mellitus with diabetic chronic kidney disease: Secondary | ICD-10-CM | POA: Diagnosis not present

## 2022-04-07 DIAGNOSIS — I129 Hypertensive chronic kidney disease with stage 1 through stage 4 chronic kidney disease, or unspecified chronic kidney disease: Secondary | ICD-10-CM | POA: Diagnosis not present

## 2022-04-10 DIAGNOSIS — C44319 Basal cell carcinoma of skin of other parts of face: Secondary | ICD-10-CM | POA: Diagnosis not present

## 2022-04-12 ENCOUNTER — Other Ambulatory Visit: Payer: Self-pay | Admitting: Internal Medicine

## 2022-04-14 DIAGNOSIS — C61 Malignant neoplasm of prostate: Secondary | ICD-10-CM | POA: Diagnosis not present

## 2022-04-14 DIAGNOSIS — E1169 Type 2 diabetes mellitus with other specified complication: Secondary | ICD-10-CM | POA: Diagnosis not present

## 2022-04-14 DIAGNOSIS — E663 Overweight: Secondary | ICD-10-CM | POA: Diagnosis not present

## 2022-04-14 DIAGNOSIS — I509 Heart failure, unspecified: Secondary | ICD-10-CM | POA: Diagnosis not present

## 2022-04-14 DIAGNOSIS — E1122 Type 2 diabetes mellitus with diabetic chronic kidney disease: Secondary | ICD-10-CM | POA: Diagnosis not present

## 2022-04-14 DIAGNOSIS — N1832 Chronic kidney disease, stage 3b: Secondary | ICD-10-CM | POA: Diagnosis not present

## 2022-04-14 DIAGNOSIS — I110 Hypertensive heart disease with heart failure: Secondary | ICD-10-CM | POA: Diagnosis not present

## 2022-04-14 DIAGNOSIS — G309 Alzheimer's disease, unspecified: Secondary | ICD-10-CM | POA: Diagnosis not present

## 2022-04-14 DIAGNOSIS — I4891 Unspecified atrial fibrillation: Secondary | ICD-10-CM | POA: Diagnosis not present

## 2022-04-14 DIAGNOSIS — E785 Hyperlipidemia, unspecified: Secondary | ICD-10-CM | POA: Diagnosis not present

## 2022-04-14 DIAGNOSIS — F0280 Dementia in other diseases classified elsewhere without behavioral disturbance: Secondary | ICD-10-CM | POA: Diagnosis not present

## 2022-04-14 DIAGNOSIS — F0150 Vascular dementia without behavioral disturbance: Secondary | ICD-10-CM | POA: Diagnosis not present

## 2022-04-23 DIAGNOSIS — N1832 Chronic kidney disease, stage 3b: Secondary | ICD-10-CM | POA: Diagnosis not present

## 2022-04-23 DIAGNOSIS — I129 Hypertensive chronic kidney disease with stage 1 through stage 4 chronic kidney disease, or unspecified chronic kidney disease: Secondary | ICD-10-CM | POA: Diagnosis not present

## 2022-04-27 ENCOUNTER — Other Ambulatory Visit: Payer: Self-pay | Admitting: Internal Medicine

## 2022-05-04 ENCOUNTER — Ambulatory Visit: Payer: HMO | Admitting: Neurology

## 2022-05-04 ENCOUNTER — Encounter: Payer: Self-pay | Admitting: Neurology

## 2022-05-04 VITALS — BP 167/79 | HR 73 | Resp 18 | Ht 65.0 in | Wt 161.0 lb

## 2022-05-04 DIAGNOSIS — G4730 Sleep apnea, unspecified: Secondary | ICD-10-CM | POA: Diagnosis not present

## 2022-05-04 DIAGNOSIS — F0150 Vascular dementia without behavioral disturbance: Secondary | ICD-10-CM | POA: Diagnosis not present

## 2022-05-04 MED ORDER — MEMANTINE HCL 10 MG PO TABS: 10.0000 mg | ORAL_TABLET | Freq: Every day | ORAL | 3 refills | Status: DC

## 2022-05-04 NOTE — Patient Instructions (Signed)
Good to see you doing well. Continue Memantine '10mg'$  twice a day. Recommend using the CPAP regularly or discussing other options with your sleep specialist. Follow-up in 6 months, call for any changes.   FALL PRECAUTIONS: Be cautious when walking. Scan the area for obstacles that may increase the risk of trips and falls. When getting up in the mornings, sit up at the edge of the bed for a few minutes before getting out of bed. Consider elevating the bed at the head end to avoid drop of blood pressure when getting up. Walk always in a well-lit room (use night lights in the walls). Avoid area rugs or power cords from appliances in the middle of the walkways. Use a walker or a cane if necessary and consider physical therapy for balance exercise. Get your eyesight checked regularly.  FINANCIAL OVERSIGHT: Supervision, especially oversight when making financial decisions or transactions is also recommended as difficulties arise.  HOME SAFETY: Consider the safety of the kitchen when operating appliances like stoves, microwave oven, and blender. Consider having supervision and share cooking responsibilities until no longer able to participate in those. Accidents with firearms and other hazards in the house should be identified and addressed as well.  DRIVING: Regarding driving, in patients with progressive memory problems, driving will be impaired. We advise to have someone else do the driving if trouble finding directions or if minor accidents are reported. Independent driving assessment is available to determine safety of driving.  ABILITY TO BE LEFT ALONE: If patient is unable to contact 911 operator, consider using LifeLine, or when the need is there, arrange for someone to stay with patients. Smoking is a fire hazard, consider supervision or cessation. Risk of wandering should be assessed by caregiver and if detected at any point, supervision and safe proof recommendations should be instituted.  MEDICATION  SUPERVISION: Inability to self-administer medication needs to be constantly addressed. Implement a mechanism to ensure safe administration of the medications.  RECOMMENDATIONS FOR ALL PATIENTS WITH MEMORY PROBLEMS: 1. Continue to exercise (Recommend 30 minutes of walking everyday, or 3 hours every week) 2. Increase social interactions - continue going to [place] and enjoy social gatherings with friends and family 3. Eat healthy, avoid fried foods and eat more fruits and vegetables 4. Maintain adequate blood pressure, blood sugar, and blood cholesterol level. Reducing the risk of stroke and cardiovascular disease also helps promoting better memory. 5. Avoid stressful situations. Live a simple life and avoid aggravations. Organize your time and prepare for the next day in anticipation. 6. Sleep well, avoid any interruptions of sleep and avoid any distractions in the bedroom that may interfere with adequate sleep quality 7. Avoid sugar, avoid sweets as there is a strong link between excessive sugar intake, diabetes, and cognitive impairment We discussed the Mediterranean diet, which has been shown to help patients reduce the risk of progressive memory disorders and reduces cardiovascular risk. This includes eating fish, eat fruits and green leafy vegetables, nuts like almonds and hazelnuts, walnuts, and also use olive oil. Avoid fast foods and fried foods as much as possible. Avoid sweets and sugar as sugar use has been linked to worsening of memory function.  There is always a concern of gradual progression of memory problems. If this is the case, then we may need to adjust level of care according to patient needs. Support, both to the patient and caregiver, should then be put into place.       Mediterranean Diet  Why follow it? Research shows.  Those who follow the Mediterranean diet have a reduced risk of heart disease  The diet is associated with a reduced incidence of Parkinson's and Alzheimer's  diseases People following the diet may have longer life expectancies and lower rates of chronic diseases  The Dietary Guidelines for Americans recommends the Mediterranean diet as an eating plan to promote health and prevent disease  What Is the Mediterranean Diet?  Healthy eating plan based on typical foods and recipes of Mediterranean-style cooking The diet is primarily a plant based diet; these foods should make up a majority of meals   Starches - Plant based foods should make up a majority of meals - They are an important sources of vitamins, minerals, energy, antioxidants, and fiber - Choose whole grains, foods high in fiber and minimally processed items  - Typical grain sources include wheat, oats, barley, corn, brown rice, bulgar, farro, millet, polenta, couscous  - Various types of beans include chickpeas, lentils, fava beans, black beans, white beans   Fruits  Veggies - Large quantities of antioxidant rich fruits & veggies; 6 or more servings  - Vegetables can be eaten raw or lightly drizzled with oil and cooked  - Vegetables common to the traditional Mediterranean Diet include: artichokes, arugula, beets, broccoli, brussel sprouts, cabbage, carrots, celery, collard greens, cucumbers, eggplant, kale, leeks, lemons, lettuce, mushrooms, okra, onions, peas, peppers, potatoes, pumpkin, radishes, rutabaga, shallots, spinach, sweet potatoes, turnips, zucchini - Fruits common to the Mediterranean Diet include: apples, apricots, avocados, cherries, clementines, dates, figs, grapefruits, grapes, melons, nectarines, oranges, peaches, pears, pomegranates, strawberries, tangerines  Fats - Replace butter and margarine with healthy oils, such as olive oil, canola oil, and tahini  - Limit nuts to no more than a handful a day  - Nuts include walnuts, almonds, pecans, pistachios, pine nuts  - Limit or avoid candied, honey roasted or heavily salted nuts - Olives are central to the [employer] -  can be eaten whole or used in a variety of dishes   Meats Protein - Limiting red meat: no more than a few times a month - When eating red meat: choose lean cuts and keep the portion to the size of deck of cards - Eggs: approx. 0 to 4 times a week  - Fish and lean poultry: at least 2 a week  - Healthy protein sources include, chicken, [place], lean beef, lamb - Increase intake of seafood such as tuna, salmon, trout, mackerel, shrimp, scallops - Avoid or limit high fat processed meats such as sausage and bacon  Dairy - Include moderate amounts of low fat dairy products  - Focus on healthy dairy such as fat free yogurt, skim milk, low or reduced fat cheese - Limit dairy products higher in fat such as whole or 2% milk, cheese, ice cream  Alcohol - Moderate amounts of red wine is ok  - No more than 5 oz daily for women (all ages) and men older than age 63  - No more than 10 oz of wine daily for men younger than 43  Other - Limit sweets and other desserts  - Use herbs and spices instead of salt to flavor foods  - Herbs and spices common to the traditional Mediterranean Diet include: basil, bay leaves, chives, cloves, cumin, fennel, garlic, lavender, marjoram, mint, oregano, parsley, pepper, rosemary, sage, savory, sumac, tarragon, thyme   It's not just a diet, it's a lifestyle:  The Mediterranean diet includes lifestyle factors typical of those in the region  Foods, drinks  and meals are best eaten with others and savored Daily physical activity is important for overall good health This could be strenuous exercise like running and aerobics This could also be more leisurely activities such as walking, housework, yard-work, or taking the stairs Moderation is the key; a balanced and healthy diet accommodates most foods and drinks Consider portion sizes and frequency of consumption of certain foods   Meal Ideas & Options:  Breakfast:  Whole wheat toast or whole wheat English muffins with peanut  butter & hard boiled egg Steel cut oats topped with apples & cinnamon and skim milk  Fresh fruit: banana, strawberries, melon, berries, peaches  Smoothies: strawberries, bananas, greek yogurt, peanut butter Low fat greek yogurt with blueberries and granola  Egg white omelet with spinach and mushrooms Breakfast couscous: whole wheat couscous, apricots, skim milk, cranberries  Sandwiches:  Hummus and grilled vegetables (peppers, zucchini, squash) on whole wheat bread   Grilled chicken on whole wheat pita with lettuce, tomatoes, cucumbers or tzatziki  [place] salad on whole wheat bread: tuna salad made with greek yogurt, olives, red peppers, capers, green onions Garlic rosemary lamb pita: lamb sauted with garlic, rosemary, salt & pepper; add lettuce, cucumber, greek yogurt to pita - flavor with lemon juice and black pepper  Seafood:  Mediterranean grilled salmon, seasoned with garlic, basil, parsley, lemon juice and black pepper Shrimp, lemon, and spinach whole-grain pasta salad made with low fat greek yogurt  Seared scallops with lemon orzo  Seared tuna steaks seasoned salt, pepper, coriander topped with tomato mixture of olives, tomatoes, olive oil, minced garlic, parsley, green onions and cappers  Meats:  Herbed greek chicken salad with kalamata olives, cucumber, feta  Red bell peppers stuffed with spinach, bulgur, lean ground beef (or lentils) & topped with feta   Kebabs: skewers of chicken, tomatoes, onions, zucchini, squash  [place] burgers: made with red onions, mint, dill, lemon juice, feta cheese topped with roasted red peppers Vegetarian Cucumber salad: cucumbers, artichoke hearts, celery, red onion, feta cheese, tossed in olive oil & lemon juice  Hummus and whole grain pita points with a greek salad (lettuce, tomato, feta, olives, cucumbers, red onion) Lentil soup with celery, carrots made with vegetable broth, garlic, salt and pepper  Tabouli salad: parsley, bulgur, mint, scallions,  cucumbers, tomato, radishes, lemon juice, olive oil, salt and pepper.

## 2022-05-04 NOTE — Progress Notes (Signed)
NEUROLOGY FOLLOW UP OFFICE NOTE  Harold Waters [id number] [birth year]  HISTORY OF PRESENT ILLNESS: I had the pleasure of seeing Harold Waters in follow-up in the neurology clinic on 05/04/2022.  The patient was last seen by Memory Disorders PA Sharene Butters 6 months ago for memory loss. He is again accompanied by his daughter who helps supplement the history today. MMSE 29/30 in January 2023. He is on Memantine, he reports he has only been taking 1 tablet at bedtime (prescribed as 15m BID). He and his daughter report he has been overall stable, memory is the same. He continues to manage finances without issues. His wife sets up his medications, he takes them by himself and forgets once in a while but overall is on top of it. He denies getting lost driving. He has retired (but does not like to use the word "retired"), still checking on the workers at night. He misplaces his glasses and keys sometimes. He denies any headaches, dizziness, focal numbness/tingling/weakness. His sleep study had shown severe sleep apnea, he has seen Sleep Medicine and started using a CPAP machine, but [place] stopped using it and says he is sleeping good now.     History on Initial Assessment 08/23/2019: This is a pleasant 76year old right-handed man with a history of hypertension, hyperlipidemia, PAD, presenting for evaluation of memory loss. He feels that memory changes started soon after his surgery in February 2020 where he had a left femoral to AK popliteal artery bypass under general anesthesia.  Prior to this, he had minor memory changes, but soon after surgery he noticed that he was unable to do things that he could do easily in the past such as math. He used to do large numbers in the back of his head, but has more difficulty now. He lives with his wife who constantly says something about his memory saying "you forget everything," which upsets him since this was not  happening prior to the surgery. He denies getting lost driving but has missed his exit a few times because he is not focusing or forgets where he is going. He has forgotten bill payments and has had late charges (new as well). He has been forgetting his medications, his wife now helps him. He loses things constantly, his phone is lost all the time. He has put things for the pantry in the freezer. He denies leaving the stove on. He has word-finding difficulties and forgets names more. 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 He has also had a change in sleep pattern, he has always had sleep difficulties getting an average of 4 hours of sleep, but since the surgery he would only get 2-3 hours of sleep. With Trazodone 1 tab qhs he gets 4 hours. He has daytime drowsiness and would drag his feet because he is so tired. Mood is good. No family history of dementia, no history of significant head injuries or alcohol use.   He gets dizzy when walking or changing clothes, no falls. He recalls an episode of double vision 3-4 years ago, this has resolved. He has occasional difficulty swallowing water. He has some numbness in both hands. He denies any headaches, focal numbness/tingling/weakness, bowel/bladder dysfunction, anosmia, or tremors. He denies any prior history of stroke, head CT done in 07/2019 showed  an old left posterior parietal infarct and old left superior cerebellar lacunar infarcts, mild chronic microvascular disease, no acute change.     MRI brain showed mild diffuse atrophy and moderate chronic microvascular disease, chronic small strokes in the left occipital lobe, bilateral cerebellar hemispheres and thalami.     PREVIOUS MEDICATIONS: Aricept 10 mg daily   PAST MEDICAL HISTORY: Past Medical History:  Diagnosis Date   Angina    Chronic stomach ulcer    "get them off and  on" (09/19/2018)   Coronary atherosclerosis of unspecified type of vessel, native or graft    Diverticulosis    DVT of lower extremity (deep venous thrombosis) ([place]) ~ 2010   LLE   Erosive gastritis    Essential hypertension, benign    GERD (gastroesophageal reflux disease)    Headache(784.0) 02/18/12   "lately"   History of kidney stones    Internal hemorrhoids    Lower back pain    Myocardial infarction ([place]) 1997   Osteoarthritis    Other and unspecified hyperlipidemia    PAD (peripheral artery disease) ([place])    with ABI's 0.8 on the right and 0.86 on the left   Pneumonia 1957   PSVT (paroxysmal supraventricular tachycardia) ([place]) 02/18/12   Shortness of breath    "lying down"   Sleep apnea    does not wear c-pap; pt does not recall this hx on 09/19/2018    MEDICATIONS: Current Outpatient Medications on File Prior to Visit  Medication Sig Dispense Refill   amLODipine (NORVASC) 10 MG tablet TAKE 1 TABLET BY MOUTH EVERY DAY 90 tablet 1   aspirin EC 81 MG tablet Take 1 tablet (81 mg total) by mouth daily. 100 tablet 3   Blood Glucose Monitoring Suppl (ONETOUCH VERIO) w/Device KIT 1 Units by Does not apply route daily as needed. 1 kit 1   Cholecalciferol (VITAMIN D3) 50 MCG (2000 UT) capsule Take 1 capsule (2,000 Units total) by mouth daily. 100 capsule 3   glucose blood (ONETOUCH VERIO) test strip Use to check blood sugar daily 50 each 11   memantine (NAMENDA) 10 MG tablet Take 1 tablet (10 mg total) by mouth daily. 90 tablet 3   olmesartan (BENICAR) 5 MG tablet Take 5 mg by mouth daily.     polyethylene glycol powder (GLYCOLAX/MIRALAX) 17 GM/SCOOP powder Take 17 g by mouth 2 (two) times daily as needed for moderate constipation. 500 g 3   rosuvastatin (CRESTOR) 40 MG tablet TAKE 1 TABLET (40 MG TOTAL) BY MOUTH DAILY. 90 tablet 1   tamsulosin (FLOMAX) 0.4 MG CAPS capsule Take 1 capsule (0.4 mg total) by mouth daily after supper. 30 capsule 5   cloNIDine (CATAPRES - DOSED IN MG/24  HR) 0.1 mg/24hr patch Place 1 patch (0.1 mg total) onto the skin once a week. (Patient not taking: Reported on 05/04/2022) 4 patch 12   cloNIDine (CATAPRES) 0.1 MG tablet TAKE 1 TABLET BY MOUTH 2-3 TIMES DAILY (Patient not taking: Reported on 05/04/2022) 270 tablet 3   hydrALAZINE (APRESOLINE) 100 MG tablet Take 1 tablet (100 mg total) by mouth 3 (three) times daily. (Patient not taking: Reported on 05/04/2022) 90 tablet 11   hydrALAZINE (APRESOLINE) 25 MG tablet Take 1 tablet (25 mg total) by mouth 3 (three) times daily. Overdue for follow-up appt  must see provider for future refills (Patient not taking: Reported on 05/04/2022) 270 tablet 0   mirtazapine (REMERON) 15 MG tablet TAKE 1/2 TABLET EVERY NIGHT (Patient not taking: Reported on  05/04/2022) 45 tablet 4   Multiple Vitamins-Minerals (MENS MULTIVITAMIN PLUS) TABS Take 1 tablet by mouth at bedtime.  (Patient not taking: Reported on 12/03/2021)     nitroGLYCERIN (NITROSTAT) 0.4 MG SL tablet Place 1 tablet under tongue every 5 mins. DO NOT USE MORE THAN 3 TABS (Patient not taking: Reported on 05/04/2022) 25 tablet 5   No current facility-administered medications on file prior to visit.    ALLERGIES: Allergies  Allergen Reactions   Eggs Or Egg-Derived Products Itching   Aricept [Donepezil] Nausea And Vomiting    n/v   Coreg [Carvedilol] Other (See Comments)    fatigue    FAMILY HISTORY: Family History  Problem Relation Age of Onset   Ulcers Father        had stomach issues, not sure what happened   Coronary artery disease Brother        male 1st degree relative <50   Colon cancer Neg Hx    Esophageal cancer Neg Hx    Rectal cancer Neg Hx    Stomach cancer Neg Hx     SOCIAL HISTORY: Social History   Socioeconomic History   Marital status: Married    Spouse name: Not on file   Number of children: 2   Years of education: Not on file   Highest education level: Not on file  Occupational History   Occupation: retired  Tobacco Use    Smoking status: Former    Packs/day: 1.00    Years: 40.00    Total pack years: 40.00    Types: Cigarettes    Quit date: 04/03/2010    Years since quitting: 12.0   Smokeless tobacco: Never  Vaping Use   Vaping Use: Never used  Substance and Sexual Activity   Alcohol use: Not Currently    Comment: "used to drink when I was young; quit ~ 1980's"   Drug use: Never   Sexual activity: Yes  Other Topics Concern   Not on file  Social History Narrative   Right handed   Two story home   4 children   Social Determinants of Health   Financial Resource Strain: Low Risk  (09/19/2021)   Overall Financial Resource Strain (CARDIA)    Difficulty of Paying Living Expenses: Not hard at all  Food Insecurity: No Food Insecurity (03/05/2020)   Hunger Vital Sign    Worried About Running Out of Food in the Last Year: Never true    Ran Out of Food in the Last Year: Never true  Transportation Needs: No Transportation Needs (03/05/2020)   PRAPARE - Hydrologist (Medical): No    Lack of Transportation (Non-Medical): No  Physical Activity: Insufficiently Active (01/31/2019)   Exercise Vital Sign    Days of Exercise per Week: 3 days    Minutes of Exercise per Session: 30 min  Stress: No Stress Concern Present (01/31/2019)   [place]    Feeling of Stress : Only a little  Social Connections: Moderately Integrated (01/31/2019)   Social Connection and Isolation Panel [NHANES]    Frequency of Communication with Friends and Family: More than three times a week    Frequency of Social Gatherings with Friends and Family: Twice a week    Attends Religious Services: 1 to 4 times per year    Active Member of [employer] or Organizations: No    Attends Archivist Meetings: Never    Marital Status: Married  Intimate Partner Violence: Not At Risk (01/31/2019)   Humiliation, Afraid, Rape, and Kick questionnaire    Fear of  Current or Ex-Partner: No    Emotionally Abused: No    Physically Abused: No    Sexually Abused: No     PHYSICAL EXAM: Vitals:   05/04/22 1023  BP: (!) 167/79  Pulse: 73  Resp: 18  SpO2: 98%   General: No acute distress Head:  Normocephalic/atraumatic Skin/Extremities: No rash, no edema Neurological Exam: alert and oriented to person, place, and time. No aphasia or dysarthria. Fund of knowledge is appropriate.  Recent and remote memory are intact, 2/3 delayed recall.  Attention and concentration are normal, 4/5 WORLD backwards. Cranial nerves: Pupils equal, round. Extraocular movements intact with no nystagmus. Visual fields full.  No facial asymmetry.  Motor: Bulk and tone normal, muscle strength 5/5 throughout with no pronator drift.   Finger to nose testing intact.  Gait narrow-based and steady, no ataxia. No tremors.     IMPRESSION: This is a pleasant 76 yo RH man with mild vascular dementia without behavioral disturbance. MMSE 29/30 in 11/2021. He had side effects on Donepezil and takes Memantine 12m daily. He would like to go ahead and take it BID as prescribed. Memory overall stable, continue to monitor. He has not been using his CPAP saying sleep is good, we discussed the diagnosis of severe sleep apnea and the need for CPAP or follow-up with Sleep Medicine to discuss plan moving forward if he stops CPAP. Continue to monitor driving. We again discussed the importance of control of vascular risk factors, physical exercise, MIND diet for overall brain health. Follow-up with Memory Disorders PA SSharene Buttersin 6 months, call for any changes.    Thank you for allowing me to participate in his care.  Please do not hesitate to call for any questions or concerns.    KEllouise Newer M.D.   CC: Dr. PAlain Marion

## 2022-05-12 ENCOUNTER — Telehealth: Payer: Self-pay

## 2022-05-12 DIAGNOSIS — I739 Peripheral vascular disease, unspecified: Secondary | ICD-10-CM

## 2022-05-12 DIAGNOSIS — R600 Localized edema: Secondary | ICD-10-CM

## 2022-05-12 NOTE — Telephone Encounter (Signed)
Pt's daughter, [name], called stating that the pt's L eg is swelling, shiny, and he is limping when he walks.  Reviewed pt's chart, returned call, two identifiers used. Pt has been elevating above his heart, with little improvement in swelling and he has been wearing his compression stockings as much as possible. Pt denies any open wounds, any red or hot to touch areas, and any discoloration. Appts for [place] and provider visits. Confirmed understanding.

## 2022-05-13 ENCOUNTER — Other Ambulatory Visit: Payer: Self-pay

## 2022-05-13 DIAGNOSIS — R600 Localized edema: Secondary | ICD-10-CM

## 2022-05-13 NOTE — Progress Notes (Signed)
Office Note     CC:  follow up Requesting Provider:  Plotnikov, [name], MD  HPI: Harold Waters is a 76 y.o. (Sep 07, 1946) male who presents with pain and increased swelling of left lower extremity. His pain is in his left knee and hip. He says this has been present for about 1 year. He says it is most bothersome going from sitting to standing position and on ambulation. He says his left hip in particular just feels " heavy". After about 1/5 mile he says he has to stop and rest and then he can continue walking. He denies any pain, tightness or cramping in rest of his left leg. He has no discomfort in RLE. His left leg started to become more swollen over the past month or so. He no longer wears compression as he says he thought he did not need to continue to do so. He does elevate, which helps with the swelling. He has no tissue loss.   He has history left common femoral to above-the-knee popliteal artery bypass with vein by Dr. Donzetta Matters on 12/06/2018.  He has had edema of his left lower extremity since surgery. He had a venous reflux study performed in July 2021, which demonstrated deep venous reflux with incompetent popliteal, femoral, and common femoral vein as well as incompetent small saphenous vein.  He also has varicose veins in his medial lower leg distal to his popliteal incision.He has previously been measured for  15 to 20 mmHg compression, which he was wearing at time of last visit.   The pt is on a statin for cholesterol management.    The pt is on an aspirin.    Other AC:  none The pt is on CCB for hypertension.  The pt does not have diabetes. Tobacco hx:  former  Past Medical History:  Diagnosis Date   Angina    Chronic stomach ulcer    "get them off and on" (09/19/2018)   Coronary atherosclerosis of unspecified type of vessel, native or graft    Diverticulosis    DVT of lower extremity (deep venous thrombosis) ([place]) ~ 2010   LLE   Erosive gastritis    Essential  hypertension, benign    GERD (gastroesophageal reflux disease)    Headache(784.0) 02/18/12   "lately"   History of kidney stones    Internal hemorrhoids    Lower back pain    Myocardial infarction ([place]) 1997   Osteoarthritis    Other and unspecified hyperlipidemia    PAD (peripheral artery disease) ([facility])    with ABI's 0.8 on the right and 0.86 on the left   Pneumonia 1957   PSVT (paroxysmal supraventricular tachycardia) ([place]) 02/18/12   Shortness of breath    "lying down"   Sleep apnea    does not wear c-pap; pt does not recall this hx on 09/19/2018    Past Surgical History:  Procedure Laterality Date   ARTHROSCOPY KNEE W/ DRILLING Right ~ 2001   BYPASS GRAFT Left 12/05/2018   femoral popliteal    CARDIAC CATHETERIZATION  09/19/2018   CATARACT EXTRACTION     COLONOSCOPY     CORONARY ANGIOPLASTY WITH STENT PLACEMENT  1997   "2"   FEMORAL-POPLITEAL BYPASS GRAFT Left 12/06/2018   Procedure: BYPASS GRAFT FEMORAL-POPLITEAL ARTERY;  Surgeon: Waynetta Sandy, MD;  Location: [facility];  Service: Vascular;  Laterality: Left;   ILIAC ARTERY STENT  04/22/2017   . Placement of a 6 mm x 100 mm Viabahn covered stent  left SFA   INTRAVASCULAR PRESSURE WIRE/FFR STUDY  09/19/2018   INTRAVASCULAR PRESSURE WIRE/FFR STUDY N/A 09/19/2018   Procedure: INTRAVASCULAR PRESSURE WIRE/FFR STUDY;  Surgeon: [name], MD;  Location: [facility];  Service: Cardiovascular;  Laterality: N/A;   LEFT HEART CATH AND CORONARY ANGIOGRAPHY N/A 09/19/2018   Procedure: LEFT HEART CATH AND CORONARY ANGIOGRAPHY;  Surgeon: [name], MD;  Location: [place];  Service: Cardiovascular;  Laterality: N/A;   LEFT HEART CATHETERIZATION WITH CORONARY ANGIOGRAM N/A 02/19/2012   Procedure: LEFT HEART CATHETERIZATION WITH CORONARY ANGIOGRAM;  Surgeon: Larey Dresser, MD;  Location: [facility] C[facility];  Service: Cardiovascular;  Laterality: N/A;   LOWER EXTREMITY ANGIOGRAPHY Bilateral 04/22/2017    Procedure: Lower Extremity Angiography;  Surgeon: [name], MD;  Location: [place] [facility];  Service: Cardiovascular;  Laterality: Bilateral;   LOWER EXTREMITY ANGIOGRAPHY Bilateral 09/19/2018   LOWER EXTREMITY ANGIOGRAPHY Bilateral 09/19/2018   Procedure: LOWER EXTREMITY ANGIOGRAPHY;  Surgeon: [name], MD;  Location: [place] [facility];  Service: Cardiovascular;  Laterality: Bilateral;   PERIPHERAL ARTERIAL STENT GRAFT  09/2010   LLE   PERIPHERAL VASCULAR ATHERECTOMY Left 04/22/2017   Procedure: Peripheral Vascular Atherectomy;  Surgeon: [name], MD;  Location: [place] [facility];  Service: Cardiovascular;  Laterality: Left;  SFA   PERIPHERAL VASCULAR INTERVENTION Left 04/22/2017   Procedure: Peripheral Vascular Intervention;  Surgeon: [name], MD;  Location: [place];  Service: Cardiovascular;  Laterality: Left;  SFA   UPPER GASTROINTESTINAL ENDOSCOPY      Social History   Socioeconomic History   Marital status: Married    Spouse name: Not on file   Number of children: 2   Years of education: Not on file   Highest education level: Not on file  Occupational History   Occupation: retired  Tobacco Use   Smoking status: Former    Packs/day: 1.00    Years: 40.00    Total pack years: 40.00    Types: Cigarettes    Quit date: 04/03/2010    Years since quitting: 12.1    Passive exposure: Never   Smokeless tobacco: Never  Vaping Use   Vaping Use: Never used  Substance and Sexual Activity   Alcohol use: Not Currently    Comment: "used to drink when I was young; quit ~ 1980's"   Drug use: Never   Sexual activity: Yes  Other Topics Concern   Not on file  Social History Narrative   Right handed   Two story home   4 children   Social Determinants of Health   Financial Resource Strain: Low Risk  (09/19/2021)   Overall Financial Resource Strain (CARDIA)    Difficulty of Paying Living Expenses: Not hard at all  Food Insecurity: No  [place] (03/05/2020)   Hunger Vital Sign    Worried About Running Out of Food in the Last Year: Never true    [place] in the Last Year: Never true  Transportation Needs: No Transportation Needs (03/05/2020)   PRAPARE - Hydrologist (Medical): No    Lack of Transportation (Non-Medical): No  Physical Activity: Insufficiently Active (01/31/2019)   Exercise Vital Sign    Days of Exercise per Week: 3 days    Minutes of Exercise per Session: 30 min  Stress: No Stress Concern Present (01/31/2019)   [place]  Feeling of Stress : Only a little  Social Connections: Moderately Integrated (01/31/2019)   Social Connection and Isolation Panel [NHANES]    Frequency of Communication with Friends and Family: More than three times a week    Frequency of Social Gatherings with Friends and Family: Twice a week    Attends Religious Services: 1 to 4 times per year    Active Member of [employer] or Organizations: No    Attends Archivist Meetings: Never    Marital Status: Married  Human resources officer Violence: Not At Risk (01/31/2019)   Humiliation, Afraid, Rape, and Kick questionnaire    Fear of Current or Ex-Partner: No    Emotionally Abused: No    Physically Abused: No    Sexually Abused: No    Family History  Problem Relation Age of Onset   Ulcers Father        had stomach issues, not sure what happened   Coronary artery disease Brother        male 1st degree relative <50   Colon cancer Neg Hx    Esophageal cancer Neg Hx    Rectal cancer Neg Hx    Stomach cancer Neg Hx     Current Outpatient Medications  Medication Sig Dispense Refill   amLODipine (NORVASC) 10 MG tablet TAKE 1 TABLET BY MOUTH EVERY DAY 90 tablet 1   aspirin EC 81 MG tablet Take 1 tablet (81 mg total) by mouth daily. 100 tablet 3   Blood Glucose Monitoring Suppl (ONETOUCH VERIO) w/Device KIT 1 Units by Does not apply  route daily as needed. 1 kit 1   Cholecalciferol (VITAMIN D3) 50 MCG (2000 UT) capsule Take 1 capsule (2,000 Units total) by mouth daily. 100 capsule 3   cloNIDine (CATAPRES - DOSED IN MG/24 HR) 0.1 mg/24hr patch Place 1 patch (0.1 mg total) onto the skin once a week. 4 patch 12   cloNIDine (CATAPRES) 0.1 MG tablet TAKE 1 TABLET BY MOUTH 2-3 TIMES DAILY 270 tablet 3   glucose blood (ONETOUCH VERIO) test strip Use to check blood sugar daily 50 each 11   hydrALAZINE (APRESOLINE) 100 MG tablet Take 1 tablet (100 mg total) by mouth 3 (three) times daily. 90 tablet 11   hydrALAZINE (APRESOLINE) 25 MG tablet Take 1 tablet (25 mg total) by mouth 3 (three) times daily. Overdue for follow-up appt  must see provider for future refills 270 tablet 0   memantine (NAMENDA) 10 MG tablet Take 1 tablet (10 mg total) by mouth daily. 180 tablet 3   mirtazapine (REMERON) 15 MG tablet TAKE 1/2 TABLET EVERY NIGHT 45 tablet 4   Multiple Vitamins-Minerals (MENS MULTIVITAMIN PLUS) TABS Take 1 tablet by mouth at bedtime.     nitroGLYCERIN (NITROSTAT) 0.4 MG SL tablet Place 1 tablet under tongue every 5 mins. DO NOT USE MORE THAN 3 TABS 25 tablet 5   olmesartan (BENICAR) 5 MG tablet Take 5 mg by mouth daily.     polyethylene glycol powder (GLYCOLAX/MIRALAX) 17 GM/SCOOP powder Take 17 g by mouth 2 (two) times daily as needed for moderate constipation. 500 g 3   rosuvastatin (CRESTOR) 40 MG tablet TAKE 1 TABLET (40 MG TOTAL) BY MOUTH DAILY. 90 tablet 1   tamsulosin (FLOMAX) 0.4 MG CAPS capsule Take 1 capsule (0.4 mg total) by mouth daily after supper. 30 capsule 5   No current facility-administered medications for this visit.    Allergies  Allergen Reactions   Eggs Or Egg-Derived Products Itching  Aricept [Donepezil] Nausea And Vomiting    n/v   Coreg [Carvedilol] Other (See Comments)    fatigue     REVIEW OF SYSTEMS:  _0  denotes positive finding, _1  denotes negative finding Cardiac  Comments:  Chest pain or  chest pressure:    Shortness of breath upon exertion:    Short of breath when lying flat:    Irregular heart rhythm:        Vascular    Pain in calf, thigh, or hip brought on by ambulation:    Pain in feet at night that wakes you up from your sleep:     Blood clot in your veins:    Leg swelling:  X       Pulmonary    Oxygen at home:    Productive cough:     Wheezing:         Neurologic    Sudden weakness in arms or legs:     Sudden numbness in arms or legs:     Sudden onset of difficulty speaking or slurred speech:    Temporary loss of vision in one eye:     Problems with dizziness:         Gastrointestinal    Blood in stool:     Vomited blood:         Genitourinary    Burning when urinating:     Blood in urine:        Psychiatric    Major depression:         Hematologic    Bleeding problems:    Problems with blood clotting too easily:        Skin    Rashes or ulcers:        Constitutional    Fever or chills:      PHYSICAL EXAMINATION:  Vitals:   05/15/22 0850  BP: 129/71  Pulse: 65  Resp: 20  Temp: 98.2 F (36.8 C)  TempSrc: Temporal  SpO2: 95%  Weight: 165 lb 3.2 oz (74.9 kg)  Height: _2  (1.651 m)    General:  WDWN in NAD; vital signs documented above Gait: Normal HENT: WNL, normocephalic Pulmonary: normal non-labored breathing Cardiac: regular HR, without  Murmurs without carotid bruit Abdomen: soft, ND Vascular Exam/Pulses:  Right Left  Radial 2+ (normal) 2+ (normal)  Femoral 2+ (normal) 2+ (normal)  Popliteal Not palpable Palpable pulse in BPG  DP 2+ (normal) 2+ (normal)  PT 2+ (normal) 2+ (normal)   Extremities: without ischemic changes, without Gangrene , without cellulitis; without open wounds; bilateral lower extremity edema, L > R Musculoskeletal: no muscle wasting or atrophy  Neurologic: A&O X 3;  No focal weakness or paresthesias are detected Psychiatric:  The pt has Normal affect.   Non-Invasive Vascular Imaging:    +-------+-----------+-----------+------------+------------+  ABI/TBIToday's ABIToday's TBIPrevious ABIPrevious TBI  +-------+-----------+-----------+------------+------------+  Right  1.02       0.65       1.06        0.81          +-------+-----------+-----------+------------+------------+  Left   0.95       0.56       1.03        0.75          +-------+-----------+-----------+------------+------------+   Summary:  RIGHT:  - No evidence of common femoral vein obstruction.     LEFT:  - There is no evidence of deep vein thrombosis in the lower extremity.  - There  is no evidence of superficial venous thrombosis.     - No cystic structure found in the popliteal fossa.        ASSESSMENT/PLAN:: 76 y.o. male here for follow up for PAD and chronic venous insufficiency. He has history of left common femoral to above-the-knee popliteal artery bypass with vein by Dr. Donzetta Matters on 12/06/2018. He has had prior duplex showing chronic deep venous insufficiency and SSV insufficiency. I discussed with him that with these findings and history of vein bypass that he will have chronic swelling. I have encouraged continued daily elevation of his legs above level of his heart. I also have encouraged daily use of 12-20 mmHg knee high compression stockings as well as exercise. His duplex today shows no DVT. I provided reassurance that he does not have any life threatening issues with his legs at this time. His ABIs today also are overall stable. He does have some left hip discomfort that could indicate some buttock claudication however he has great femoral pulse so this is less likely arterial and more likely musculoskeletal. I discussed with patient that if this becomes worse to discuss with PCP and ortho evaluation may be indicated. Will will continue to follow his arterial disease but no indicate for further imaging at this time. - Continue Aspirin and statin  - He was measured again today for 15-20  mmHg compression stockings and purchased a pair at today's visit - His original annual follow up with left lower extremity bypass graft duplex is scheduled in 3-4 months - He knows to call for earlier follow up should he have any new or concerning symptoms   Karoline Caldwell, PA-C [facility] [phone]  Clinic MD:   Virl Cagey

## 2022-05-14 ENCOUNTER — Ambulatory Visit (INDEPENDENT_AMBULATORY_CARE_PROVIDER_SITE_OTHER)
Admission: RE | Admit: 2022-05-14 | Discharge: 2022-05-14 | Disposition: A | Discharge status: Home / Self Care | Payer: HMO | Source: Ambulatory Visit | Attending: Vascular Surgery | Admitting: Vascular Surgery

## 2022-05-14 ENCOUNTER — Ambulatory Visit (HOSPITAL_COMMUNITY)
Admission: RE | Admit: 2022-05-14 | Discharge: 2022-05-14 | Disposition: A | Discharge status: Home / Self Care | Payer: HMO | Source: Ambulatory Visit | Attending: Vascular Surgery | Admitting: Vascular Surgery

## 2022-05-14 DIAGNOSIS — R600 Localized edema: Secondary | ICD-10-CM

## 2022-05-14 DIAGNOSIS — I739 Peripheral vascular disease, unspecified: Secondary | ICD-10-CM | POA: Diagnosis not present

## 2022-05-15 ENCOUNTER — Ambulatory Visit: Payer: HMO | Admitting: Physician Assistant

## 2022-05-15 VITALS — BP 129/71 | HR 65 | Temp 98.2°F | Resp 20 | Ht 65.0 in | Wt 165.2 lb

## 2022-05-15 DIAGNOSIS — I872 Venous insufficiency (chronic) (peripheral): Secondary | ICD-10-CM | POA: Diagnosis not present

## 2022-05-15 DIAGNOSIS — I739 Peripheral vascular disease, unspecified: Secondary | ICD-10-CM | POA: Diagnosis not present

## 2022-05-15 DIAGNOSIS — M7989 Other specified soft tissue disorders: Secondary | ICD-10-CM

## 2022-05-22 ENCOUNTER — Other Ambulatory Visit: Payer: Self-pay

## 2022-05-22 DIAGNOSIS — I872 Venous insufficiency (chronic) (peripheral): Secondary | ICD-10-CM

## 2022-05-22 DIAGNOSIS — I70213 Atherosclerosis of native arteries of extremities with intermittent claudication, bilateral legs: Secondary | ICD-10-CM

## 2022-05-22 DIAGNOSIS — I739 Peripheral vascular disease, unspecified: Secondary | ICD-10-CM

## 2022-06-01 ENCOUNTER — Ambulatory Visit (INDEPENDENT_AMBULATORY_CARE_PROVIDER_SITE_OTHER): Payer: HMO | Admitting: Internal Medicine

## 2022-06-01 ENCOUNTER — Encounter: Payer: Self-pay | Admitting: Internal Medicine

## 2022-06-01 VITALS — BP 118/60 | HR 60 | Resp 18 | Ht 65.0 in | Wt 166.2 lb

## 2022-06-01 DIAGNOSIS — R413 Other amnesia: Secondary | ICD-10-CM | POA: Diagnosis not present

## 2022-06-01 DIAGNOSIS — E118 Type 2 diabetes mellitus with unspecified complications: Secondary | ICD-10-CM

## 2022-06-01 DIAGNOSIS — R5383 Other fatigue: Secondary | ICD-10-CM

## 2022-06-01 DIAGNOSIS — I10 Essential (primary) hypertension: Secondary | ICD-10-CM

## 2022-06-01 DIAGNOSIS — I70212 Atherosclerosis of native arteries of extremities with intermittent claudication, left leg: Secondary | ICD-10-CM | POA: Diagnosis not present

## 2022-06-01 DIAGNOSIS — E785 Hyperlipidemia, unspecified: Secondary | ICD-10-CM | POA: Diagnosis not present

## 2022-06-01 DIAGNOSIS — E1169 Type 2 diabetes mellitus with other specified complication: Secondary | ICD-10-CM

## 2022-06-01 DIAGNOSIS — N183 Chronic kidney disease, stage 3 unspecified: Secondary | ICD-10-CM

## 2022-06-01 LAB — CBC
HCT: 35.5 % — ABNORMAL LOW (ref 39.0–52.0)
Hemoglobin: 11.9 g/dL — ABNORMAL LOW (ref 13.0–17.0)
MCHC: 33.6 g/dL (ref 30.0–36.0)
MCV: 88.1 fl (ref 78.0–100.0)
Platelets: 191 10*3/uL (ref 150.0–400.0)
RBC: 4.03 Mil/uL — ABNORMAL LOW (ref 4.22–5.81)
RDW: 14.4 % (ref 11.5–15.5)
WBC: 5.1 10*3/uL (ref 4.0–10.5)

## 2022-06-01 LAB — COMPREHENSIVE METABOLIC PANEL
ALT: 17 U/L (ref 0–53)
AST: 14 U/L (ref 0–37)
Albumin: 4.2 g/dL (ref 3.5–5.2)
Alkaline Phosphatase: 51 U/L (ref 39–117)
BUN: 40 mg/dL — ABNORMAL HIGH (ref 6–23)
CO2: 27 mEq/L (ref 19–32)
Calcium: 9 mg/dL (ref 8.4–10.5)
Chloride: 102 mEq/L (ref 96–112)
Creatinine, Ser: 2.33 mg/dL — ABNORMAL HIGH (ref 0.40–1.50)
GFR: 26.5 mL/min — ABNORMAL LOW (ref >60.00)
Glucose, Bld: 207 mg/dL — ABNORMAL HIGH (ref 70–99)
Potassium: 4 mEq/L (ref 3.5–5.1)
Sodium: 137 mEq/L (ref 135–145)
Total Bilirubin: 0.5 mg/dL (ref 0.2–1.2)
Total Protein: 6.8 g/dL (ref 6.0–8.3)

## 2022-06-01 LAB — LIPID PANEL
Cholesterol: 177 mg/dL (ref 0–200)
HDL: 43.2 mg/dL (ref >39.00)
LDL Cholesterol: 98 mg/dL (ref 0–99)
NonHDL: 133.74
Total CHOL/HDL Ratio: 4
Triglycerides: 177 mg/dL — ABNORMAL HIGH (ref 0.0–149.0)
VLDL: 35.4 mg/dL (ref 0.0–40.0)

## 2022-06-01 LAB — MICROALBUMIN / CREATININE URINE RATIO
Creatinine,U: 74.2 mg/dL
Microalb Creat Ratio: 21.9 mg/g (ref 0.0–30.0)
Microalb, Ur: 16.3 mg/dL — ABNORMAL HIGH (ref 0.0–1.9)

## 2022-06-01 LAB — HEMOGLOBIN A1C: Hgb A1c MFr Bld: 7.3 % — ABNORMAL HIGH (ref 4.6–6.5)

## 2022-06-01 NOTE — Assessment & Plan Note (Signed)
Checking CMP today for stability. Has seen nephrology and they did vascular US without changes. BP at goal and checking on DM control.

## 2022-06-01 NOTE — Progress Notes (Unsigned)
   Subjective:   Patient ID: Harold Waters, male    DOB: 1946-01-07, 76 y.o.   MRN: [id number]  HPI The patient is a 76 YO man coming in for acute visit which is improving. Wants follow up.  Review of Systems  Constitutional:  Positive for fatigue.  HENT: Negative.    Eyes: Negative.   Respiratory:  Negative for cough, chest tightness and shortness of breath.   Cardiovascular:  Negative for chest pain, palpitations and leg swelling.  Gastrointestinal:  Negative for abdominal distention, abdominal pain, constipation, diarrhea, nausea and vomiting.  Musculoskeletal: Negative.   Skin: Negative.   Neurological: Negative.   Psychiatric/Behavioral: Negative.      Objective:  Physical Exam Constitutional:      Appearance: He is well-developed.  HENT:     Head: Normocephalic and atraumatic.  Cardiovascular:     Rate and Rhythm: Normal rate and regular rhythm.  Pulmonary:     Effort: Pulmonary effort is normal. No respiratory distress.     Breath sounds: Normal breath sounds. No wheezing or rales.  Abdominal:     General: Bowel sounds are normal. There is no distension.     Palpations: Abdomen is soft.     Tenderness: There is no abdominal tenderness. There is no rebound.  Musculoskeletal:     Cervical back: Normal range of motion.  Skin:    General: Skin is warm and dry.     Comments: Foot exam done  Neurological:     Mental Status: He is alert and oriented to person, place, and time.     Coordination: Coordination normal.     Vitals:   06/01/22 0901  BP: 118/60  Pulse: 60  Resp: 18  SpO2: 94%  Weight: 166 lb 3.2 oz (75.4 kg)  Height: '5\' 5"'$  (1.651 m)    Assessment & Plan:

## 2022-06-01 NOTE — Assessment & Plan Note (Signed)
Taking aspirin and crestor. Checking lipid panel and adjust as needed. No rest pain and no change in claudication.

## 2022-06-01 NOTE — Patient Instructions (Signed)
We will check the labs today. 

## 2022-06-02 NOTE — Assessment & Plan Note (Signed)
Foot exam done, checking HgA1c and microalbumin to creatinine ratio and CMP. Checking lipid panel as due. Adjust regimen as needed. Currently diet controlled. Taking ARB and statin. Reminded about eye exam.

## 2022-06-02 NOTE — Assessment & Plan Note (Signed)
Neurology had increased namenda to 20 mg daily from 10 mg daily recently. This did cause extreme tiredness and sleeping all day. He has since returned to 10 mg namenda daily and advised him to continue on that dosing. He is feeling improved now about 3-4 days back on lower dosing.

## 2022-06-02 NOTE — Assessment & Plan Note (Signed)
Checking lipid panel and adjust crestor 40 mg daily as needed for LDL <100 goal.

## 2022-06-02 NOTE — Assessment & Plan Note (Signed)
Checking CBC and CMP and hgA1c to ensure no metabolic cause. Seems most likely related to medications and is resolving s/p medication change back to prior dosing.

## 2022-06-02 NOTE — Assessment & Plan Note (Signed)
Is seeing nephrology for hard to control BP and CKD stage 3. Taking clonidine 0.1 mg daily patch and amlodipine 10 mg daily and hydralazine 100 mg TID and olmesartan 5 mg daily. Checking CMP and adjust as needed. BP at goal today.

## 2022-06-03 ENCOUNTER — Telehealth: Payer: HMO

## 2022-06-08 ENCOUNTER — Ambulatory Visit (INDEPENDENT_AMBULATORY_CARE_PROVIDER_SITE_OTHER): Payer: HMO

## 2022-06-08 DIAGNOSIS — Z0000 Encounter for general adult medical examination without abnormal findings: Secondary | ICD-10-CM

## 2022-06-08 NOTE — Patient Instructions (Signed)
Harold Waters , Thank you for taking time to come for your Medicare Wellness Visit. I appreciate your ongoing commitment to your health goals. Please review the following plan we discussed and let me know if I can assist you in the future.   Screening recommendations/referrals: Colonoscopy: no longer required  Recommended yearly ophthalmology/optometry visit for glaucoma screening and checkup Recommended yearly dental visit for hygiene and checkup  Vaccinations: Influenza vaccine: completed  Pneumococcal vaccine: completed  Tdap vaccine: 12/19/2014 Shingles vaccine: will consider     Advanced directives: none   Conditions/risks identified: none   Next appointment: none   Preventive Care 62 Years and Older, Male Preventive care refers to lifestyle choices and visits with your health care provider that can promote health and wellness. What does preventive care include? A yearly physical exam. This is also called an annual well check. Dental exams once or twice a year. Routine eye exams. Ask your health care provider how often you should have your eyes checked. Personal lifestyle choices, including: Daily care of your teeth and gums. Regular physical activity. Eating a healthy diet. Avoiding tobacco and drug use. Limiting alcohol use. Practicing safe sex. Taking low doses of aspirin every day. Taking vitamin and mineral supplements as recommended by your health care provider. What happens during an annual well check? The services and screenings done by your health care provider during your annual well check will depend on your age, overall health, lifestyle risk factors, and family history of disease. Counseling  Your health care provider may ask you questions about your: Alcohol use. Tobacco use. Drug use. Emotional well-being. Home and relationship well-being. Sexual activity. Eating habits. History of falls. Memory and ability to understand (cognition). Work and work  Statistician. Screening  You may have the following tests or measurements: Height, weight, and BMI. Blood pressure. Lipid and cholesterol levels. These may be checked every 5 years, or more frequently if you are over 21 years old. Skin check. Lung cancer screening. You may have this screening every year starting at age 76 if you have a 30-pack-year history of smoking and currently smoke or have quit within the past 15 years. Fecal occult blood test (FOBT) of the stool. You may have this test every year starting at age 76. Flexible sigmoidoscopy or colonoscopy. You may have a sigmoidoscopy every 5 years or a colonoscopy every 10 years starting at age 21. Prostate cancer screening. Recommendations will vary depending on your family history and other risks. Hepatitis C blood test. Hepatitis B blood test. Sexually transmitted disease (STD) testing. Diabetes screening. This is done by checking your blood sugar (glucose) after you have not eaten for a while (fasting). You may have this done every 1-3 years. Abdominal aortic aneurysm (AAA) screening. You may need this if you are a current or former smoker. Osteoporosis. You may be screened starting at age 76 if you are at high risk. Talk with your health care provider about your test results, treatment options, and if necessary, the need for more tests. Vaccines  Your health care provider may recommend certain vaccines, such as: Influenza vaccine. This is recommended every year. Tetanus, diphtheria, and acellular pertussis (Tdap, Td) vaccine. You may need a Td booster every 10 years. Zoster vaccine. You may need this after age 11. Pneumococcal 13-valent conjugate (PCV13) vaccine. One dose is recommended after age 76. Pneumococcal polysaccharide (PPSV23) vaccine. One dose is recommended after age 76. Talk to your health care provider about which screenings and vaccines you need and  how often you need them. This information is not intended to replace  advice given to you by your health care provider. Make sure you discuss any questions you have with your health care provider. Document Released: 11/15/2015 Document Revised: 07/08/2016 Document Reviewed: 08/20/2015 Elsevier Interactive Patient Education  2017 [employer] Prevention in the Home Falls can cause injuries. They can happen to people of all ages. There are many things you can do to make your home safe and to help prevent falls. What can I do on the outside of my home? Regularly fix the edges of walkways and driveways and fix any cracks. Remove anything that might make you trip as you walk through a door, such as a raised step or threshold. Trim any bushes or trees on the path to your home. Use bright outdoor lighting. Clear any walking paths of anything that might make someone trip, such as rocks or tools. Regularly check to see if handrails are loose or broken. Make sure that both sides of any steps have handrails. Any raised decks and porches should have guardrails on the edges. Have any leaves, snow, or ice cleared regularly. Use sand or salt on walking paths during winter. Clean up any spills in your garage right away. This includes oil or grease spills. What can I do in the bathroom? Use night lights. Install grab bars by the toilet and in the tub and shower. Do not use towel bars as grab bars. Use non-skid mats or decals in the tub or shower. If you need to sit down in the shower, use a plastic, non-slip stool. Keep the floor dry. Clean up any water that spills on the floor as soon as it happens. Remove soap buildup in the tub or shower regularly. Attach bath mats securely with double-sided non-slip rug tape. Do not have throw rugs and other things on the floor that can make you trip. What can I do in the bedroom? Use night lights. Make sure that you have a light by your bed that is easy to reach. Do not use any sheets or blankets that are too big for your bed.  They should not hang down onto the floor. Have a firm chair that has side arms. You can use this for support while you get dressed. Do not have throw rugs and other things on the floor that can make you trip. What can I do in the kitchen? Clean up any spills right away. Avoid walking on wet floors. Keep items that you use a lot in easy-to-reach places. If you need to reach something above you, use a strong step stool that has a grab bar. Keep electrical cords out of the way. Do not use floor polish or wax that makes floors slippery. If you must use wax, use non-skid floor wax. Do not have throw rugs and other things on the floor that can make you trip. What can I do with my stairs? Do not leave any items on the stairs. Make sure that there are handrails on both sides of the stairs and use them. Fix handrails that are broken or loose. Make sure that handrails are as long as the stairways. Check any carpeting to make sure that it is firmly attached to the stairs. Fix any carpet that is loose or worn. Avoid having throw rugs at the top or bottom of the stairs. If you do have throw rugs, attach them to the floor with carpet tape. Make sure that you have  a light switch at the top of the stairs and the bottom of the stairs. If you do not have them, ask someone to add them for you. What else can I do to help prevent falls? Wear shoes that: Do not have high heels. Have rubber bottoms. Are comfortable and fit you well. Are closed at the toe. Do not wear sandals. If you use a stepladder: Make sure that it is fully opened. Do not climb a closed stepladder. Make sure that both sides of the stepladder are locked into place. Ask someone to hold it for you, if possible. Clearly mark and make sure that you can see: Any grab bars or handrails. First and last steps. Where the edge of each step is. Use tools that help you move around (mobility aids) if they are needed. These  include: Canes. Walkers. Scooters. Crutches. Turn on the lights when you go into a dark area. Replace any light bulbs as soon as they burn out. Set up your furniture so you have a clear path. Avoid moving your furniture around. If any of your floors are uneven, fix them. If there are any pets around you, be aware of where they are. Review your medicines with your doctor. Some medicines can make you feel dizzy. This can increase your chance of falling. Ask your doctor what other things that you can do to help prevent falls. This information is not intended to replace advice given to you by your health care provider. Make sure you discuss any questions you have with your health care provider. Document Released: 08/15/2009 Document Revised: 03/26/2016 Document Reviewed: 11/23/2014 Elsevier Interactive Patient Education  2017 [employer].

## 2022-06-08 NOTE — Progress Notes (Cosign Needed Addendum)
Subjective:   Harold Waters is a 76 y.o. male who presents for an Subsequent Medicare Annual Wellness Visit.   I connected with Huston Foley (Daughter Rosiland Oz ) today by telephone and verified that I am speaking with the correct person using two identifiers. Location patient: home Location provider: work Persons participating in the virtual visit: patient, provider.   I discussed the limitations, risks, security and privacy concerns of performing an evaluation and management service by telephone and the availability of in person appointments. I also discussed with the patient that there may be a patient responsible charge related to this service. The patient expressed understanding and verbally consented to this telephonic visit.    Interactive audio and video telecommunications were attempted between this provider and patient, however failed, due to patient having technical difficulties OR patient did not have access to video capability.  We continued and completed visit with audio only.    Review of Systems     Cardiac Risk Factors include: advanced age (>60mn, >>83women);male gender     Objective:    Today's Vitals   There is no height or weight on file to calculate BMI.     06/08/2022   10:29 AM 05/04/2022    9:58 AM 02/04/2022   10:35 AM 11/07/2021    9:38 AM 10/10/2021    2:50 PM 05/01/2021   11:33 AM 03/10/2021    5:39 PM  Advanced Directives  Does Patient Have a Medical Advance Directive? No No No No No No No  Would patient like information on creating a medical advance directive? No - Patient declined          Current Medications (verified) Outpatient Encounter Medications as of 06/08/2022  Medication Sig   amLODipine (NORVASC) 10 MG tablet TAKE 1 TABLET BY MOUTH EVERY DAY   aspirin EC 81 MG tablet Take 1 tablet (81 mg total) by mouth daily.   Blood Glucose Monitoring Suppl (ONETOUCH VERIO) w/Device KIT 1 Units by Does not apply route daily as needed.    Cholecalciferol (VITAMIN D3) 50 MCG (2000 UT) capsule Take 1 capsule (2,000 Units total) by mouth daily.   memantine (NAMENDA) 10 MG tablet Take 1 tablet (10 mg total) by mouth daily.   mirtazapine (REMERON) 15 MG tablet TAKE 1/2 TABLET EVERY NIGHT   Multiple Vitamins-Minerals (MENS MULTIVITAMIN PLUS) TABS Take 1 tablet by mouth at bedtime.   nitroGLYCERIN (NITROSTAT) 0.4 MG SL tablet Place 1 tablet under tongue every 5 mins. DO NOT USE MORE THAN 3 TABS   olmesartan (BENICAR) 5 MG tablet Take 5 mg by mouth daily.   polyethylene glycol powder (GLYCOLAX/MIRALAX) 17 GM/SCOOP powder Take 17 g by mouth 2 (two) times daily as needed for moderate constipation.   rosuvastatin (CRESTOR) 40 MG tablet TAKE 1 TABLET (40 MG TOTAL) BY MOUTH DAILY.   tamsulosin (FLOMAX) 0.4 MG CAPS capsule Take 1 capsule (0.4 mg total) by mouth daily after supper.   cloNIDine (CATAPRES - DOSED IN MG/24 HR) 0.1 mg/24hr patch Place 1 patch (0.1 mg total) onto the skin once a week.   cloNIDine (CATAPRES) 0.1 MG tablet TAKE 1 TABLET BY MOUTH 2-3 TIMES DAILY   glucose blood (ONETOUCH VERIO) test strip Use to check blood sugar daily (Patient not taking: Reported on 06/08/2022)   hydrALAZINE (APRESOLINE) 100 MG tablet Take 1 tablet (100 mg total) by mouth 3 (three) times daily.   hydrALAZINE (APRESOLINE) 25 MG tablet Take 1 tablet (25 mg total) by mouth 3 (  three) times daily. Overdue for follow-up appt  must see provider for future refills   No facility-administered encounter medications on file as of 06/08/2022.    Allergies (verified) Eggs or egg-derived products, Aricept [donepezil], and Coreg [carvedilol]   History: Past Medical History:  Diagnosis Date   Angina    Chronic stomach ulcer    "get them off and on" (09/19/2018)   Coronary atherosclerosis of unspecified type of vessel, native or graft    Diverticulosis    DVT of lower extremity (deep venous thrombosis) ([place]) ~ 2010   LLE   Erosive gastritis    Essential  hypertension, benign    GERD (gastroesophageal reflux disease)    Headache(784.0) 02/18/12   "lately"   History of kidney stones    Internal hemorrhoids    Lower back pain    Myocardial infarction ([place]) 1997   Osteoarthritis    Other and unspecified hyperlipidemia    PAD (peripheral artery disease) ([place])    with ABI's 0.8 on the right and 0.86 on the left   Pneumonia 1957   PSVT (paroxysmal supraventricular tachycardia) ([place]) 02/18/12   Shortness of breath    "lying down"   Sleep apnea    does not wear c-pap; pt does not recall this hx on 09/19/2018   Past Surgical History:  Procedure Laterality Date   ARTHROSCOPY KNEE W/ DRILLING Right ~ 2001   BYPASS GRAFT Left 12/05/2018   femoral popliteal    CARDIAC CATHETERIZATION  09/19/2018   CATARACT EXTRACTION     COLONOSCOPY     CORONARY ANGIOPLASTY WITH STENT PLACEMENT  1997   "2"   FEMORAL-POPLITEAL BYPASS GRAFT Left 12/06/2018   Procedure: BYPASS GRAFT FEMORAL-POPLITEAL ARTERY;  Surgeon: Waynetta Sandy, MD;  Location: [facility];  Service: Vascular;  Laterality: Left;   ILIAC ARTERY STENT  04/22/2017   . Placement of a 6 mm x 100 mm Viabahn covered stent left SFA   INTRAVASCULAR PRESSURE WIRE/FFR STUDY  09/19/2018   INTRAVASCULAR PRESSURE WIRE/FFR STUDY N/A 09/19/2018   Procedure: INTRAVASCULAR PRESSURE WIRE/FFR STUDY;  Surgeon: [name], MD;  Location: [facility];  Service: Cardiovascular;  Laterality: N/A;   LEFT HEART CATH AND CORONARY ANGIOGRAPHY N/A 09/19/2018   Procedure: LEFT HEART CATH AND CORONARY ANGIOGRAPHY;  Surgeon: [name], MD;  Location: [facility];  Service: Cardiovascular;  Laterality: N/A;   LEFT HEART CATHETERIZATION WITH CORONARY ANGIOGRAM N/A 02/19/2012   Procedure: LEFT HEART CATHETERIZATION WITH CORONARY ANGIOGRAM;  Surgeon: Larey Dresser, MD;  Location: [facility];  Service: Cardiovascular;  Laterality: N/A;   LOWER EXTREMITY ANGIOGRAPHY Bilateral 04/22/2017    Procedure: Lower Extremity Angiography;  Surgeon: [name], MD;  Location: [facility];  Service: Cardiovascular;  Laterality: Bilateral;   LOWER EXTREMITY ANGIOGRAPHY Bilateral 09/19/2018   LOWER EXTREMITY ANGIOGRAPHY Bilateral 09/19/2018   Procedure: LOWER EXTREMITY ANGIOGRAPHY;  Surgeon: [name], MD;  Location: [facility];  Service: Cardiovascular;  Laterality: Bilateral;   PERIPHERAL ARTERIAL STENT GRAFT  09/2010   LLE   PERIPHERAL VASCULAR ATHERECTOMY Left 04/22/2017   Procedure: Peripheral Vascular Atherectomy;  Surgeon: [name], MD;  Location: [facility];  Service: Cardiovascular;  Laterality: Left;  SFA   PERIPHERAL VASCULAR INTERVENTION Left 04/22/2017   Procedure: Peripheral Vascular Intervention;  Surgeon: [name], MD;  Location: [facility];  Service: Cardiovascular;  Laterality: Left;  SFA   UPPER GASTROINTESTINAL ENDOSCOPY  Family History  Problem Relation Age of Onset   Ulcers Father        had stomach issues, not sure what happened   Coronary artery disease Brother        male 1st degree relative <50   Colon cancer Neg Hx    Esophageal cancer Neg Hx    Rectal cancer Neg Hx    Stomach cancer Neg Hx    Social History   Socioeconomic History   Marital status: Married    Spouse name: Not on file   Number of children: 2   Years of education: Not on file   Highest education level: Not on file  Occupational History   Occupation: retired  Tobacco Use   Smoking status: Former    Packs/day: 1.00    Years: 40.00    Total pack years: 40.00    Types: Cigarettes    Quit date: 04/03/2010    Years since quitting: 12.1    Passive exposure: Never   Smokeless tobacco: Never  Vaping Use   Vaping Use: Never used  Substance and Sexual Activity   Alcohol use: Not Currently    Comment: "used to drink when I was young; quit ~ 1980's"   Drug use: Never   Sexual activity: Yes  Other Topics Concern   Not on  file  Social History Narrative   Right handed   Two story home   4 children   Social Determinants of Health   Financial Resource Strain: Low Risk  (06/08/2022)   Overall Financial Resource Strain (CARDIA)    Difficulty of Paying Living Expenses: Not hard at all  Food Insecurity: No Food Insecurity (06/08/2022)   Hunger Vital Sign    Worried About Running Out of Food in the Last Year: Never true    Ran Out of Food in the Last Year: Never true  Transportation Needs: No Transportation Needs (06/08/2022)   PRAPARE - Hydrologist (Medical): No    Lack of Transportation (Non-Medical): No  Physical Activity: Insufficiently Active (06/08/2022)   Exercise Vital Sign    Days of Exercise per Week: 3 days    Minutes of Exercise per Session: 30 min  Stress: No Stress Concern Present (06/08/2022)   [place]    Feeling of Stress : Not at all  Social Connections: Moderately Integrated (06/08/2022)   Social Connection and Isolation Panel [NHANES]    Frequency of Communication with Friends and Family: Three times a week    Frequency of Social Gatherings with Friends and Family: Three times a week    Attends Religious Services: More than 4 times per year    Active Member of Clubs or Organizations: No    Attends Archivist Meetings: Never    Marital Status: Married    Tobacco Counseling Counseling given: Not Answered   Clinical Intake:  Pre-visit preparation completed: Yes  Pain : No/denies pain     Nutritional Risks: None Diabetes: No  How often do you need to have someone help you when you read instructions, pamphlets, or other written materials from your doctor or pharmacy?: 1 - Never What is the last grade level you completed in school?: [place]   Interpreter Needed?: No  Information entered by :: L.Wilson,LPN   Activities of Daily Living    06/08/2022   10:24  AM  In your present state of health, do you have any difficulty  performing the following activities:  Hearing? 0  Vision? 0  Difficulty concentrating or making decisions? 0  Walking or climbing stairs? 0  Dressing or bathing? 0  Doing errands, shopping? 0  Preparing Food and eating ? N  Using the Toilet? N  In the past six months, have you accidently leaked urine? N  Do you have problems with loss of bowel control? N  Managing your Medications? N  Managing your Finances? N  Housekeeping or managing your Housekeeping? N    Patient Care Team: Plotnikov, [name], MD as PCP - General (Internal Medicine) [name], MD as PCP - Cardiology (Cardiology) Stanford Breed Denice Bors, MD as Consulting Physician (Cardiology) Waynetta Sandy, MD as Consulting Physician (Vascular Surgery) [name], MD as Consulting Physician (Cardiology) Cameron Sprang, MD as Consulting Physician (Neurology) Marlou Sa Tonna Corner, MD as Consulting Physician (Orthopedic Surgery) Ladene Artist, MD as Consulting Physician (Gastroenterology) Szabat, Darnelle Maffucci, [facility] (Inactive) as Pharmacist (Pharmacist) Tyler Pita, MD as Consulting Physician (Radiation Oncology)  Indicate any recent Medical Services you may have received from other than [facility] providers in the past year (date may be approximate).     Assessment:   This is a routine wellness examination for Harold Waters.  Hearing/Vision screen Vision Screening - Comments:: Annual eye exams   Dietary issues and exercise activities discussed: Current Exercise Habits: Home exercise routine, Type of exercise: walking, Time (Minutes): 30, Frequency (Times/Week): 3, Weekly Exercise (Minutes/Week): 90, Intensity: Mild, Exercise limited by: None identified   Goals Addressed   None    Depression Screen    06/08/2022   10:30 AM 06/08/2022   10:29 AM 06/08/2022   10:22 AM 06/01/2022    9:06 AM 03/29/2020    3:51 PM 03/05/2020    3:29 PM 03/05/2020   12:21 PM   PHQ 2/9 Scores  PHQ - 2 Score 0 0 0 0 0 0 0  PHQ- 9 Score    0       Fall Risk    06/08/2022   10:30 AM 06/01/2022    9:06 AM 05/04/2022    9:58 AM 11/07/2021    9:38 AM 05/01/2021   11:32 AM  Fall Risk   Falls in the past year? 0 0 0 0 0  Number falls in past yr: 0 0 0 0 0  Injury with Fall? 0 0 0 0 0  Follow up Falls evaluation completed;Education provided        FALL RISK PREVENTION PERTAINING TO THE HOME:  Any stairs in or around the home? No  If so, are there any without handrails? No  Home free of loose throw rugs in walkways, pet beds, electrical cords, etc? Yes  Adequate lighting in your home to reduce risk of falls? Yes   ASSISTIVE DEVICES UTILIZED TO PREVENT FALLS:  Life alert? No  Use of a cane, walker or w/c? No  Grab bars in the bathroom? No  Shower chair or bench in shower? No  Elevated toilet seat or a handicapped toilet? No    Cognitive Function:  Cognitive status assessed by telephone conversation daughter  Rosiland Oz  Patient has current diagnosis of cognitive impairment. Patient is followed by neurology for ongoing assessment. Dr.Aquino Patient is unable to complete screening 6CIT or MMSE.      11/07/2021   10:00 AM 05/01/2021    1:00 PM 03/29/2020    4:00 PM  MMSE - Mini Mental State Exam  Orientation to time 5 5 5   Orientation  to Place 5 5 5   Registration 3 3 3   Attention/ Calculation 5 5 4   Recall 3 3 0  Language- name 2 objects 2 2 2   Language- repeat 1 1 1   Language- follow 3 step command 3 3 3   Language- read & follow direction 1 1 1   Write a sentence 1 1 1   Copy design 0 1 1  Total score 29 30 26         06/08/2022   10:31 AM  6CIT Screen  What Year? 0 points  What month? 0 points  What time? 0 points  Count back from 20 0 points  Months in reverse 0 points  Repeat phrase 0 points  Total Score 0 points    Immunizations Immunization History  Administered Date(s) Administered   Fluad Quad(high Dose 65+) 09/05/2019   Hepatitis B  09/01/2007, 10/18/2007, 04/23/2008   Influenza Whole 08/20/2008   Pneumococcal Conjugate-13 06/22/2016   Pneumococcal Polysaccharide-23 12/19/2014   Td 12/19/2014    TDAP status: Up to date  Flu Vaccine status: Up to date  Pneumococcal vaccine status: Up to date  Covid-19 vaccine status: Completed vaccines  Qualifies for Shingles Vaccine? Yes   Zostavax completed No   Shingrix Completed?: No.    Education has been provided regarding the importance of this vaccine. Patient has been advised to call insurance company to determine out of pocket expense if they have not yet received this vaccine. Advised may also receive vaccine at local pharmacy or Health Dept. Verbalized acceptance and understanding.  Screening Tests Health Maintenance  Topic Date Due   OPHTHALMOLOGY EXAM  Never done   Hepatitis C Screening  Never done   Zoster Vaccines- Shingrix (1 of 2) Never done   HEMOGLOBIN A1C  12/02/2022   Diabetic kidney evaluation - GFR measurement  06/02/2023   Diabetic kidney evaluation - Urine ACR  06/02/2023   FOOT EXAM  06/02/2023   TETANUS/TDAP  12/19/2024   Pneumonia Vaccine 68+ Years old  Completed   HPV VACCINES  Aged Out   COLONOSCOPY (Pts 45-41yr Insurance coverage will need to be confirmed)  Discontinued   COVID-19 Vaccine  Discontinued    Health Maintenance  Health Maintenance Due  Topic Date Due   OPHTHALMOLOGY EXAM  Never done   Hepatitis C Screening  Never done   Zoster Vaccines- Shingrix (1 of 2) Never done    Colorectal cancer screening: No longer required.   Lung Cancer Screening: (Low Dose CT Chest recommended if Age 76-80years, 30 pack-year currently smoking OR have quit w/in 15years.) does not qualify.   Lung Cancer Screening Referral: n/a  Additional Screening:  Hepatitis C Screening: does not qualify;   Vision Screening: Recommended annual ophthalmology exams for early detection of glaucoma and other disorders of the eye. Is the patient up to  date with their annual eye exam?  Yes  Who is the provider or what is the name of the office in which the patient attends annual eye exams? [facility]  If pt is not established with a provider, would they like to be referred to a provider to establish care? No .   Dental Screening: Recommended annual dental exams for proper oral hygiene  Community Resource Referral / Chronic Care Management: CRR required this visit?  No   CCM required this visit?  No      Plan:     I have personally reviewed and noted the following in the patient's chart:   Medical and social history  Use of alcohol, tobacco or illicit drugs  Current medications and supplements including opioid prescriptions. Patient is not currently taking opioid prescriptions. Functional ability and status Nutritional status Physical activity Advanced directives List of other physicians Hospitalizations, surgeries, and ER visits in previous 12 months Vitals Screenings to include cognitive, depression, and falls Referrals and appointments  In addition, I have reviewed and discussed with patient certain preventive protocols, quality metrics, and best practice recommendations. A written personalized care plan for preventive services as well as general preventive health recommendations were provided to patient.     Daphane Shepherd, LPN   05/04/2201   Nurse Notes: none    Medical screening examination/treatment/procedure(s) were performed by non-physician practitioner and as supervising physician I was immediately available for consultation/collaboration.  I agree with above. Lew Dawes, MD

## 2022-07-07 ENCOUNTER — Other Ambulatory Visit: Payer: Self-pay | Admitting: Internal Medicine

## 2022-07-13 DIAGNOSIS — N1832 Chronic kidney disease, stage 3b: Secondary | ICD-10-CM | POA: Diagnosis not present

## 2022-07-22 DIAGNOSIS — E1122 Type 2 diabetes mellitus with diabetic chronic kidney disease: Secondary | ICD-10-CM | POA: Diagnosis not present

## 2022-07-22 DIAGNOSIS — N261 Atrophy of kidney (terminal): Secondary | ICD-10-CM | POA: Diagnosis not present

## 2022-07-22 DIAGNOSIS — N1832 Chronic kidney disease, stage 3b: Secondary | ICD-10-CM | POA: Diagnosis not present

## 2022-07-22 DIAGNOSIS — I129 Hypertensive chronic kidney disease with stage 1 through stage 4 chronic kidney disease, or unspecified chronic kidney disease: Secondary | ICD-10-CM | POA: Diagnosis not present

## 2022-07-23 ENCOUNTER — Other Ambulatory Visit: Payer: Self-pay | Admitting: Internal Medicine

## 2022-08-03 DIAGNOSIS — Z8546 Personal history of malignant neoplasm of prostate: Secondary | ICD-10-CM | POA: Diagnosis not present

## 2022-08-03 DIAGNOSIS — R300 Dysuria: Secondary | ICD-10-CM | POA: Diagnosis not present

## 2022-08-16 ENCOUNTER — Other Ambulatory Visit: Payer: Self-pay | Admitting: *Deleted

## 2022-08-16 DIAGNOSIS — I739 Peripheral vascular disease, unspecified: Secondary | ICD-10-CM

## 2022-08-16 DIAGNOSIS — I872 Venous insufficiency (chronic) (peripheral): Secondary | ICD-10-CM

## 2022-08-16 DIAGNOSIS — I70213 Atherosclerosis of native arteries of extremities with intermittent claudication, bilateral legs: Secondary | ICD-10-CM

## 2022-08-19 NOTE — Progress Notes (Signed)
HISTORY AND PHYSICAL     CC:  follow up. Requesting Provider:  Plotnikov, [name], MD  HPI: This is a 76 y.o. male who is here today for follow up for PAD.  Pt has hx of  left common femoral to above-the-knee popliteal artery bypass with vein by Dr. Donzetta Matters on 12/06/2018.  He has had edema of his left lower extremity since surgery. He had a venous reflux study performed in July 2021, which demonstrated deep venous reflux with incompetent popliteal, femoral, and common femoral vein as well as incompetent small saphenous vein.  He also has varicose veins in his medial lower leg distal to his popliteal incision.He has previously been measured for  15 to 20 mmHg compression.   Pt was last seen 05/15/2022 and at that time, he was having pain and increased swelling of the LLE in his knee and hip that had been present for about a year.  It was bothersome going from sitting to standing and walking.  He felt his left hip felt heavy.  He was not having any pain , tightness or cramping at rest in the left leg.  He did not have any issues in the right leg.  He had a DVT study and this was negative.  He was elevating his legs, which was helping with swelling.  He did not have any non healing wounds.   In March, he did have a renal artery u/s that was unremarkable without evidence of RAS.  The pt returns today for follow up.  He states he is doing well.  He does not have any non healing wounds.  He does have some persistent swelling.  He does wear his compression socks and elevates his legs, which helps with his swelling.  He states he can walk a mile without stopping and does not have claudication sx.  He denies any rest pain.   The pt is on a statin for cholesterol management.    The pt is on an aspirin.    Other AC:  none The pt is on CCB, hydralazine, ARB, clonidine for hypertension.  The pt does have diabetes. Tobacco hx:  former  Pt does not have family hx of AAA.  Past Medical History:  Diagnosis Date    Angina    Chronic stomach ulcer    "get them off and on" (09/19/2018)   Coronary atherosclerosis of unspecified type of vessel, native or graft    Diverticulosis    DVT of lower extremity (deep venous thrombosis) ([place]) ~ 2010   LLE   Erosive gastritis    Essential hypertension, benign    GERD (gastroesophageal reflux disease)    Headache(784.0) 02/18/12   "lately"   History of kidney stones    Internal hemorrhoids    Lower back pain    Myocardial infarction ([place]) 1997   Osteoarthritis    Other and unspecified hyperlipidemia    PAD (peripheral artery disease) ([place])    with ABI's 0.8 on the right and 0.86 on the left   Pneumonia 1957   PSVT (paroxysmal supraventricular tachycardia) ([place]) 02/18/12   Shortness of breath    "lying down"   Sleep apnea    does not wear c-pap; pt does not recall this hx on 09/19/2018    Past Surgical History:  Procedure Laterality Date   ARTHROSCOPY KNEE W/ DRILLING Right ~ 2001   BYPASS GRAFT Left 12/05/2018   femoral popliteal    CARDIAC CATHETERIZATION  09/19/2018   CATARACT EXTRACTION  COLONOSCOPY     CORONARY ANGIOPLASTY WITH STENT PLACEMENT  1997   "2"   FEMORAL-POPLITEAL BYPASS GRAFT Left 12/06/2018   Procedure: BYPASS GRAFT FEMORAL-POPLITEAL ARTERY;  Surgeon: Waynetta Sandy, MD;  Location: [facility];  Service: Vascular;  Laterality: Left;   ILIAC ARTERY STENT  04/22/2017   . Placement of a 6 mm x 100 mm Viabahn covered stent left SFA   INTRAVASCULAR PRESSURE WIRE/FFR STUDY  09/19/2018   INTRAVASCULAR PRESSURE WIRE/FFR STUDY N/A 09/19/2018   Procedure: INTRAVASCULAR PRESSURE WIRE/FFR STUDY;  Surgeon: [name], MD;  Location: [facility];  Service: Cardiovascular;  Laterality: N/A;   LEFT HEART CATH AND CORONARY ANGIOGRAPHY N/A 09/19/2018   Procedure: LEFT HEART CATH AND CORONARY ANGIOGRAPHY;  Surgeon: [name], MD;  Location: [facility];  Service: Cardiovascular;  Laterality: N/A;   LEFT HEART  CATHETERIZATION WITH CORONARY ANGIOGRAM N/A 02/19/2012   Procedure: LEFT HEART CATHETERIZATION WITH CORONARY ANGIOGRAM;  Surgeon: Larey Dresser, MD;  Location: [facility];  Service: Cardiovascular;  Laterality: N/A;   LOWER EXTREMITY ANGIOGRAPHY Bilateral 04/22/2017   Procedure: Lower Extremity Angiography;  Surgeon: [name], MD;  Location: [facility];  Service: Cardiovascular;  Laterality: Bilateral;   LOWER EXTREMITY ANGIOGRAPHY Bilateral 09/19/2018   LOWER EXTREMITY ANGIOGRAPHY Bilateral 09/19/2018   Procedure: LOWER EXTREMITY ANGIOGRAPHY;  Surgeon: [name], MD;  Location: [facility];  Service: Cardiovascular;  Laterality: Bilateral;   PERIPHERAL ARTERIAL STENT GRAFT  09/2010   LLE   PERIPHERAL VASCULAR ATHERECTOMY Left 04/22/2017   Procedure: Peripheral Vascular Atherectomy;  Surgeon: [name], MD;  Location: [facility];  Service: Cardiovascular;  Laterality: Left;  SFA   PERIPHERAL VASCULAR INTERVENTION Left 04/22/2017   Procedure: Peripheral Vascular Intervention;  Surgeon: [name], MD;  Location: [facility];  Service: Cardiovascular;  Laterality: Left;  SFA   UPPER GASTROINTESTINAL ENDOSCOPY      Allergies  Allergen Reactions   Eggs Or Egg-Derived Products Itching   Aricept [Donepezil] Nausea And Vomiting    n/v   Coreg [Carvedilol] Other (See Comments)    fatigue    Current Outpatient Medications  Medication Sig Dispense Refill   amLODipine (NORVASC) 10 MG tablet Take 1 tablet (10 mg total) by mouth daily. Annual appt due in Jan must see provider for future refills 90 tablet 1   aspirin EC 81 MG tablet Take 1 tablet (81 mg total) by mouth daily. 100 tablet 3   Blood Glucose Monitoring Suppl (ONETOUCH VERIO) w/Device KIT 1 Units by Does not apply route daily as needed. 1 kit 1   Cholecalciferol (VITAMIN D3) 50 MCG (2000 UT) capsule Take 1 capsule (2,000 Units total) by mouth daily. 100 capsule 3    cloNIDine (CATAPRES - DOSED IN MG/24 HR) 0.1 mg/24hr patch Place 1 patch (0.1 mg total) onto the skin once a week. 4 patch 12   cloNIDine (CATAPRES) 0.1 MG tablet TAKE 1 TABLET BY MOUTH 2-3 TIMES DAILY 270 tablet 3   glucose blood (ONETOUCH VERIO) test strip Use to check blood sugar daily (Patient not taking: Reported on 06/08/2022) 50 each 11   hydrALAZINE (APRESOLINE) 100 MG tablet Take 1 tablet (100 mg total) by mouth 3 (three) times daily. 90 tablet 11   hydrALAZINE (APRESOLINE) 25 MG tablet Take 1 tablet (25 mg total) by mouth 3 (three) times daily. 270 tablet 1   memantine (NAMENDA) 10 MG tablet Take 1 tablet (  10 mg total) by mouth daily. 180 tablet 3   mirtazapine (REMERON) 15 MG tablet TAKE 1/2 TABLET EVERY NIGHT 45 tablet 4   Multiple Vitamins-Minerals (MENS MULTIVITAMIN PLUS) TABS Take 1 tablet by mouth at bedtime.     nitroGLYCERIN (NITROSTAT) 0.4 MG SL tablet Place 1 tablet under tongue every 5 mins. DO NOT USE MORE THAN 3 TABS 25 tablet 5   olmesartan (BENICAR) 5 MG tablet Take 5 mg by mouth daily.     polyethylene glycol powder (GLYCOLAX/MIRALAX) 17 GM/SCOOP powder Take 17 g by mouth 2 (two) times daily as needed for moderate constipation. 500 g 3   rosuvastatin (CRESTOR) 40 MG tablet TAKE 1 TABLET (40 MG TOTAL) BY MOUTH DAILY. 90 tablet 1   tamsulosin (FLOMAX) 0.4 MG CAPS capsule Take 1 capsule (0.4 mg total) by mouth daily after supper. 30 capsule 5   No current facility-administered medications for this visit.    Family History  Problem Relation Age of Onset   Ulcers Father        had stomach issues, not sure what happened   Coronary artery disease Brother        male 1st degree relative <50   Colon cancer Neg Hx    Esophageal cancer Neg Hx    Rectal cancer Neg Hx    Stomach cancer Neg Hx     Social History   Socioeconomic History   Marital status: Married    Spouse name: Not on file   Number of children: 2   Years of education: Not on file   Highest education  level: Not on file  Occupational History   Occupation: retired  Tobacco Use   Smoking status: Former    Packs/day: 1.00    Years: 40.00    Total pack years: 40.00    Types: Cigarettes    Quit date: 04/03/2010    Years since quitting: 12.3    Passive exposure: Never   Smokeless tobacco: Never  Vaping Use   Vaping Use: Never used  Substance and Sexual Activity   Alcohol use: Not Currently    Comment: "used to drink when I was young; quit ~ 1980's"   Drug use: Never   Sexual activity: Yes  Other Topics Concern   Not on file  Social History Narrative   Right handed   Two story home   4 children   Social Determinants of Health   Financial Resource Strain: Low Risk  (06/08/2022)   Overall Financial Resource Strain (CARDIA)    Difficulty of Paying Living Expenses: Not hard at all  Food Insecurity: No Food Insecurity (06/08/2022)   Hunger Vital Sign    Worried About Running Out of Food in the Last Year: Never true    Ran Out of Food in the Last Year: Never true  Transportation Needs: No Transportation Needs (06/08/2022)   PRAPARE - Hydrologist (Medical): No    Lack of Transportation (Non-Medical): No  Physical Activity: Insufficiently Active (06/08/2022)   Exercise Vital Sign    Days of Exercise per Week: 3 days    Minutes of Exercise per Session: 30 min  Stress: No Stress Concern Present (06/08/2022)   [place]    Feeling of Stress : Not at all  Social Connections: Moderately Integrated (06/08/2022)   Social Connection and Isolation Panel [NHANES]    Frequency of Communication with Friends and Family: Three times a week  Frequency of Social Gatherings with Friends and Family: Three times a week    Attends Religious Services: More than 4 times per year    Active Member of Clubs or Organizations: No    Attends Archivist Meetings: Never    Marital Status: Married  Arboriculturist Violence: Not At Risk (06/08/2022)   Humiliation, Afraid, Rape, and Kick questionnaire    Fear of Current or Ex-Partner: No    Emotionally Abused: No    Physically Abused: No    Sexually Abused: No     REVIEW OF SYSTEMS:   [X]  denotes positive finding, [ ]  denotes negative finding Cardiac  Comments:  Chest pain or chest pressure:    Shortness of breath upon exertion:    Short of breath when lying flat:    Irregular heart rhythm:        Vascular    Pain in calf, thigh, or hip brought on by ambulation:    Pain in feet at night that wakes you up from your sleep:     Blood clot in your veins:    Leg swelling:  x       Pulmonary    Oxygen at home:    Productive cough:     Wheezing:         Neurologic    Sudden weakness in arms or legs:     Sudden numbness in arms or legs:     Sudden onset of difficulty speaking or slurred speech:    Temporary loss of vision in one eye:     Problems with dizziness:         Gastrointestinal    Blood in stool:     Vomited blood:         Genitourinary    Burning when urinating:     Blood in urine:        Psychiatric    Major depression:         Hematologic    Bleeding problems:    Problems with blood clotting too easily:        Skin    Rashes or ulcers:        Constitutional    Fever or chills:      PHYSICAL EXAMINATION:  Today's Vitals   08/21/22 1340  BP: (!) 149/74  Pulse: 63  Resp: 16  Temp: 97.8 F (36.6 C)  TempSrc: Temporal  SpO2: 97%  Weight: 167 lb (75.8 kg)  Height: 5' 5"  (1.651 m)   Body mass index is 27.79 kg/m.   General:  WDWN in NAD; vital signs documented above Gait: Not observed HENT: WNL, normocephalic Pulmonary: normal non-labored breathing , without wheezing Cardiac: regular HR, without carotid bruits Abdomen: soft, NT; aortic pulse is not palpable Skin: without rashes Vascular Exam/Pulses:  Right Left  Radial 2+ (normal) 2+ (normal)  Femoral 2+ (normal) 2+ (normal)  Popliteal  Unable to palpate Unable to palpate  DP 2+ (normal) 2+ (normal)  PT 1+ (weak) 1+ (weak)   Extremities: without ischemic changes, without Gangrene , without cellulitis; without open wounds; +LLE swelling Musculoskeletal: no muscle wasting or atrophy  Neurologic: A&O X 3 Psychiatric:  The pt has Normal affect.   Non-Invasive Vascular Imaging:   ABI's/TBI's on 08/21/2022: Right:  1.06/0.64 - Great toe pressure: 104 Left:  1.01/0.75 - Great toe pressure: 122  Arterial duplex on 08/21/2022: Right: Patent bypass graft with disease observed in the proximal/mid segment with velocities suggestive of a 50-70% stenosis.  Previous ABI's/TBI's on 05/14/2022: Right:  1.02/0.65 - Great toe pressure: 81 Left:  0.95/0.56 - Great toe pressure:  70  Previous DVT study 05/14/2022: Negative for DVT LLE and negative for right CFV obstruction     ASSESSMENT/PLAN:: 76 y.o. male here for follow up for PAD with hx of left common femoral to above-the-knee popliteal artery bypass with vein by Dr. Donzetta Matters on 12/06/2018.  He has had edema of his left lower extremity since surgery. He had a venous reflux study performed in July 2021, which demonstrated deep venous reflux with incompetent popliteal, femoral, and common femoral vein as well as incompetent small saphenous vein.  He also has varicose veins in his medial lower leg distal to his popliteal incision.He has previously been measured for  15 to 20 mmHg compression.    -pt doing well with palpable pedal pulses.  He di have a stenosis in th eproximal and mid graft but he is asymptomatic.  We will have him return in 6 months for repeat studies.  He will call sooner if he has any new rest pain or non healing wounds.   -continue asa and statin. -he was measured and received a pair of compression socks today.   He will continue this and elevation. -encouraged him to continue walking program   Leontine Locket, [facility]  MD:   Virl Cagey

## 2022-08-21 ENCOUNTER — Ambulatory Visit (HOSPITAL_COMMUNITY)
Admission: RE | Admit: 2022-08-21 | Discharge: 2022-08-21 | Disposition: A | Discharge status: Home / Self Care | Payer: HMO | Source: Ambulatory Visit | Attending: Vascular Surgery | Admitting: Vascular Surgery

## 2022-08-21 ENCOUNTER — Ambulatory Visit (INDEPENDENT_AMBULATORY_CARE_PROVIDER_SITE_OTHER)
Admission: RE | Admit: 2022-08-21 | Discharge: 2022-08-21 | Disposition: A | Discharge status: Home / Self Care | Payer: HMO | Source: Ambulatory Visit | Attending: Vascular Surgery | Admitting: Vascular Surgery

## 2022-08-21 ENCOUNTER — Ambulatory Visit: Payer: HMO | Admitting: Physician Assistant

## 2022-08-21 VITALS — BP 149/74 | HR 63 | Temp 97.8°F | Resp 16 | Ht 65.0 in | Wt 167.0 lb

## 2022-08-21 DIAGNOSIS — I872 Venous insufficiency (chronic) (peripheral): Secondary | ICD-10-CM | POA: Insufficient documentation

## 2022-08-21 DIAGNOSIS — R600 Localized edema: Secondary | ICD-10-CM

## 2022-08-21 DIAGNOSIS — I739 Peripheral vascular disease, unspecified: Secondary | ICD-10-CM

## 2022-08-21 DIAGNOSIS — I70213 Atherosclerosis of native arteries of extremities with intermittent claudication, bilateral legs: Secondary | ICD-10-CM | POA: Diagnosis not present

## 2022-08-27 ENCOUNTER — Other Ambulatory Visit: Payer: Self-pay

## 2022-08-27 DIAGNOSIS — I739 Peripheral vascular disease, unspecified: Secondary | ICD-10-CM

## 2022-08-27 DIAGNOSIS — I70213 Atherosclerosis of native arteries of extremities with intermittent claudication, bilateral legs: Secondary | ICD-10-CM

## 2022-08-27 DIAGNOSIS — R600 Localized edema: Secondary | ICD-10-CM

## 2022-09-04 ENCOUNTER — Telehealth: Payer: HMO

## 2022-09-16 ENCOUNTER — Other Ambulatory Visit: Payer: Self-pay | Admitting: General Practice

## 2022-10-15 ENCOUNTER — Ambulatory Visit (INDEPENDENT_AMBULATORY_CARE_PROVIDER_SITE_OTHER): Payer: PPO

## 2022-10-15 ENCOUNTER — Ambulatory Visit (INDEPENDENT_AMBULATORY_CARE_PROVIDER_SITE_OTHER): Payer: PPO | Admitting: Internal Medicine

## 2022-10-15 ENCOUNTER — Encounter: Payer: Self-pay | Admitting: Internal Medicine

## 2022-10-15 VITALS — BP 130/62 | HR 61 | Temp 98.2°F | Ht 65.0 in | Wt 165.8 lb

## 2022-10-15 DIAGNOSIS — J069 Acute upper respiratory infection, unspecified: Secondary | ICD-10-CM

## 2022-10-15 DIAGNOSIS — M2550 Pain in unspecified joint: Secondary | ICD-10-CM | POA: Diagnosis not present

## 2022-10-15 DIAGNOSIS — I10 Essential (primary) hypertension: Secondary | ICD-10-CM

## 2022-10-15 DIAGNOSIS — N183 Chronic kidney disease, stage 3 unspecified: Secondary | ICD-10-CM

## 2022-10-15 HISTORY — DX: Acute upper respiratory infection, unspecified: J06.9

## 2022-10-15 MED ORDER — HYDROCODONE BIT-HOMATROP MBR 5-1.5 MG/5ML PO SOLN: 5.0000 mL | Freq: Three times a day (TID) | ORAL | 0 refills | Status: DC | PRN

## 2022-10-15 MED ORDER — AZITHROMYCIN 250 MG PO TABS: ORAL_TABLET | ORAL | 0 refills | Status: DC

## 2022-10-15 NOTE — Assessment & Plan Note (Signed)
BP is good Cont w/meds - list reviewed

## 2022-10-15 NOTE — Assessment & Plan Note (Addendum)
New CXR Z pac Hycodan prn COVID test (-)

## 2022-10-15 NOTE — Assessment & Plan Note (Signed)
Hydrate well Pt had labs w/other doctors

## 2022-10-15 NOTE — Progress Notes (Signed)
Subjective:  Patient ID: Harold Waters, male    DOB: [birth year]  Age: 76 y.o. MRN: [id number]  CC: Cough (Pt states mainly at night) and Leg Pain (C/o pain in (L) leg)   HPI Harold Waters presents for cough x 5 d; yellow sputum C/o LLE pain (L knee) - resolved F/u CRI, HTN  Outpatient Medications Prior to Visit  Medication Sig Dispense Refill   amLODipine (NORVASC) 10 MG tablet Take 1 tablet (10 mg total) by mouth daily. Annual appt due in Jan must see provider for future refills 90 tablet 1   aspirin EC 81 MG tablet Take 1 tablet (81 mg total) by mouth daily. 100 tablet 3   Blood Glucose Monitoring Suppl (ONETOUCH VERIO) w/Device KIT 1 Units by Does not apply route daily as needed. 1 kit 1   Cholecalciferol (VITAMIN D3) 50 MCG (2000 UT) capsule Take 1 capsule (2,000 Units total) by mouth daily. 100 capsule 3   glucose blood (ONETOUCH VERIO) test strip Use to check blood sugar daily 50 each 11   hydrALAZINE (APRESOLINE) 25 MG tablet Take 1 tablet (25 mg total) by mouth 3 (three) times daily. 270 tablet 1   memantine (NAMENDA) 10 MG tablet Take 1 tablet (10 mg total) by mouth daily. 180 tablet 3   mirtazapine (REMERON) 15 MG tablet TAKE 1/2 TABLET EVERY NIGHT 45 tablet 4   Multiple Vitamins-Minerals (MENS MULTIVITAMIN PLUS) TABS Take 1 tablet by mouth at bedtime.     nitroGLYCERIN (NITROSTAT) 0.4 MG SL tablet Place 1 tablet under tongue every 5 mins. DO NOT USE MORE THAN 3 TABS 25 tablet 5   olmesartan (BENICAR) 5 MG tablet Take 5 mg by mouth daily.     polyethylene glycol powder (GLYCOLAX/MIRALAX) 17 GM/SCOOP powder Take 17 g by mouth 2 (two) times daily as needed for moderate constipation. 500 g 3   rosuvastatin (CRESTOR) 40 MG tablet TAKE 1 TABLET BY MOUTH EVERY DAY 90 tablet 0   tamsulosin (FLOMAX) 0.4 MG CAPS capsule Take 1 capsule (0.4 mg total) by mouth daily after supper. 30 capsule 5   cloNIDine (CATAPRES - DOSED IN MG/24 HR) 0.1 mg/24hr patch Place 1 patch (0.1 mg  total) onto the skin once a week. 4 patch 12   cloNIDine (CATAPRES) 0.1 MG tablet TAKE 1 TABLET BY MOUTH 2-3 TIMES DAILY 270 tablet 3   hydrALAZINE (APRESOLINE) 100 MG tablet Take 1 tablet (100 mg total) by mouth 3 (three) times daily. 90 tablet 11   No facility-administered medications prior to visit.    ROS: Review of Systems  Objective:  BP 130/62 (BP Location: Left Arm)   Pulse 61   Temp 98.2 F (36.8 C) (Oral)   Ht _0  (1.651 m)   Wt 165 lb 12.8 oz (75.2 kg)   SpO2 96%   BMI 27.59 kg/m   BP Readings from Last 3 Encounters:  10/15/22 130/62  08/21/22 (!) 149/74  06/01/22 118/60    Wt Readings from Last 3 Encounters:  10/15/22 165 lb 12.8 oz (75.2 kg)  08/21/22 167 lb (75.8 kg)  06/01/22 166 lb 3.2 oz (75.4 kg)    Physical Exam  Lab Results  Component Value Date   WBC 5.1 06/01/2022   HGB 11.9 (L) 06/01/2022   HCT 35.5 (L) 06/01/2022   PLT 191.0 06/01/2022   GLUCOSE 207 (H) 06/01/2022   CHOL 177 06/01/2022   TRIG 177.0 (H) 06/01/2022   HDL 43.20 06/01/2022   LDLDIRECT 167 (H)  10/25/2019   LDLCALC 98 06/01/2022   ALT 17 06/01/2022   AST 14 06/01/2022   NA 137 06/01/2022   K 4.0 06/01/2022   CL 102 06/01/2022   CREATININE 2.33 (H) 06/01/2022   BUN 40 (H) 06/01/2022   CO2 27 06/01/2022   TSH 3.570 11/07/2021   PSA 9.90 (H) 06/26/2021   INR 0.99 11/28/2018   HGBA1C 7.3 (H) 06/01/2022   MICROALBUR 16.3 (H) 06/01/2022    VAS [place] LOWER EXTREMITY BYPASS GRAFT DUPLEX  Result Date: 08/21/2022 LOWER EXTREMITY ARTERIAL DUPLEX STUDY Patient Name:  Harold Waters  Date of Exam:   08/21/2022 Medical Rec #: [id number]             Accession #:    [id number] Date of Birth: May 13, 1946              Patient Gender: M Patient Age:   89 years Exam Location:  [facility] Procedure:      VAS [place] LOWER EXTREMITY BYPASS GRAFT DUPLEX Referring Phys: Servando Snare --------------------------------------------------------------------------------  Indications:  Peripheral artery disease, and Patient states that there is pain in              his left hip that radiates into his leg when walking. He has to sit              down for the pain to decrease. High Risk Factors: Hypertension.  Vascular Interventions: 12/06/2018: Left common femoral artery to above-knee                         popliteal artery bypass graft. Current ABI:            R=1.06, L=1.01 Performing Technologist: Harold Waters RVS, RCS  Examination Guidelines: A complete evaluation includes B-mode imaging, spectral Doppler, color Doppler, and power Doppler as needed of all accessible portions of each vessel. Bilateral testing is considered an integral part of a complete examination. Limited examinations for reoccurring indications may be performed as noted.   Right Graft #1: CFA-AK popliteal +------------------+--------+--------+--------+---------------+                   PSV cm/sStenosisWaveformComments        +------------------+--------+--------+--------+---------------+ Inflow            129             biphasic                +------------------+--------+--------+--------+---------------+ Prox Anastomosis  89              biphasic                +------------------+--------+--------+--------+---------------+ Proximal Graft    113             biphasic                Proximal-mid graft183     50-70%          plaque observed +------------------+--------+--------+--------+---------------+ Mid Graft         45              biphasic                +------------------+--------+--------+--------+---------------+ Distal Graft      42              biphasic                +------------------+--------+--------+--------+---------------+ Distal Anastomosis47  biphasic                +------------------+--------+--------+--------+---------------+ Outflow           59              biphasic                 +------------------+--------+--------+--------+---------------+   Summary: Right: Patent bypass graft with disease observed in the proximal/mid segment with velocities suggestive of a 50-70% stenosis.  See table(s) above for measurements and observations. Electronically signed by Orlie Pollen on 08/21/2022 at 4:43:58 PM.    Final    VAS [place] ABI WITH/WO TBI  Result Date: 08/21/2022  LOWER EXTREMITY DOPPLER STUDY Patient Name:  Zyeir Dymek Nethery  Date of Exam:   08/21/2022 Medical Rec #: [id number]             Accession #:    [id number] Date of Birth: 12-24-1945              Patient Gender: M Patient Age:   35 years Exam Location:  [facility] Procedure:      VAS [place] ABI WITH/WO TBI Referring Phys: --------------------------------------------------------------------------------  Indications: Peripheral artery disease, and Patient states that there is pain in              his left hip that radiates into his leg when walking. He has to sit              down for the pain to decrease. High Risk Factors: Hypertension.  Vascular Interventions: 12/06/2018: Left common femoral artery to above-knee                         popliteal artery bypass graft. Performing Technologist: Harold Waters RVS, RCS  Examination Guidelines: A complete evaluation includes at minimum, Doppler waveform signals and systolic blood pressure reading at the level of bilateral brachial, anterior tibial, and posterior tibial arteries, when vessel segments are accessible. Bilateral testing is considered an integral part of a complete examination. Photoelectric Plethysmograph (PPG) waveforms and toe systolic pressure readings are included as required and additional duplex testing as needed. Limited examinations for reoccurring indications may be performed as noted.  ABI Findings: +---------+------------------+-----+---------+--------+ Right    Rt Pressure (mmHg)IndexWaveform Comment   +---------+------------------+-----+---------+--------+ Brachial 161                                      +---------+------------------+-----+---------+--------+ PTA      172               1.06 triphasic         +---------+------------------+-----+---------+--------+ DP       116               0.71 biphasic          +---------+------------------+-----+---------+--------+ Great Toe104               0.64                   +---------+------------------+-----+---------+--------+ +---------+------------------+-----+---------+-------+ Left     Lt Pressure (mmHg)IndexWaveform Comment +---------+------------------+-----+---------+-------+ Brachial 163                                     +---------+------------------+-----+---------+-------+ PTA      165  1.01 triphasic        +---------+------------------+-----+---------+-------+ DP       156               0.96 biphasic         +---------+------------------+-----+---------+-------+ Great Toe122               0.75                  +---------+------------------+-----+---------+-------+ +-------+-----------+-----------+------------+------------+ ABI/TBIToday's ABIToday's TBIPrevious ABIPrevious TBI +-------+-----------+-----------+------------+------------+ Right  1.06       0.64       1.06        0.81         +-------+-----------+-----------+------------+------------+ Left   1.01       0.75       1.03        0.75         +-------+-----------+-----------+------------+------------+  Bilateral ABIs appear essentially unchanged compared to prior study on 08/25/2021.  Summary: Right: Resting right ankle-brachial index is within normal range. The right toe-brachial index is abnormal. Left: Resting left ankle-brachial index is within normal range. The left toe-brachial index is normal. *See table(s) above for measurements and observations.  Electronically signed by Orlie Pollen on 08/21/2022 at  4:43:35 PM.    Final     Assessment & Plan:   Problem List Items Addressed This Visit     URI (upper respiratory infection) - Primary    New CXR Z pac Hycodan prn COVID test (-)      Relevant Medications   azithromycin (ZITHROMAX Z-PAK) 250 MG tablet   Other Relevant Orders   DG Chest 2 View   Essential hypertension    BP is good Cont w/meds - list reviewed      CRI (chronic renal insufficiency), stage 3 (moderate) ([place])    Hydrate well Pt had labs w/other doctors      Arthralgia    LLE pain esolved         Meds ordered this encounter  Medications   HYDROcodone bit-homatropine (HYCODAN) 5-1.5 MG/5ML syrup    Sig: Take 5 mLs by mouth every 8 (eight) hours as needed for cough.    Dispense:  120 mL    Refill:  0   azithromycin (ZITHROMAX Z-PAK) 250 MG tablet    Sig: As directed    Dispense:  6 tablet    Refill:  0      Follow-up: No follow-ups on file.  Walker Kehr, MD

## 2022-10-15 NOTE — Assessment & Plan Note (Signed)
LLE pain esolved

## 2022-11-03 ENCOUNTER — Ambulatory Visit: Payer: PPO | Admitting: Internal Medicine

## 2022-11-05 NOTE — Progress Notes (Signed)
Assessment/Plan:   Memory Impairment likely of vascular etiology  Harold Waters is a very pleasant 77 y.o. RH male with a history of uncontrolled hypertension, hyperlipidemia, PAD, history of migraines with aura, severe sleep apnea on CPAP, history of prostate cancer, status post seed implant, seen today in 1 year follow-up follow up for memory loss. Patient has a history of vascular dementia without behavioral disturbance.  Patient is currently on memantine 10 mg daily, tolerating well. Prior MRI brain personally reviewed was remarkable for showed mild diffuse atrophy and moderate chronic microvascular disease, chronic small strokes in the left occipital lobe, bilateral cerebellar hemispheres and thalami. Current MMSE is stable at 28/30.       Follow up in  6 months. Continue CPAP for OSA Continue memantine 10 mg daily, side effect discussed Start trazodone 50 mg, take 1/4-1/2 qhs prn for sleep, side effects discussed Follow-up in 6 months    Subjective:    This patient is accompanied in the office by his daughter who supplements the history.  Previous records as well as any outside records available were reviewed prior to todays visit. Patient was last seen on 11/07/2021, at which time his MMSE was 29/30.    Any changes in memory since last visit?  "Just a little bit" He has some difficulties with recent conversations. If he is trying to remember the name of a product he may find it more difficult    repeats oneself?   Occasionally. "Slight change since last year"-daughter says Disoriented when walking into a room?  Patient denies except occasionally not remembering what patient came to the room for   Leaving objects in unusual places? Occasionally. "Went to the closet and left the key  could not find it. Sometimes I can't find my glasses or the phone"  Wandering behavior?  denies   Any personality changes since last visit? "Abrupt temper out of nowhere but not often"-daughter  says  Any worsening depression?:  denies   Hallucinations or paranoia?  denies   Seizures?    denies    Any sleep changes? " I sleep a little less than before, wake up too early, tried Remeron and felt too groggy" . Denies vivid dreams, REM behavior or sleepwalking   Sleep apnea? Endorsed  but does not like CPAP Any hygiene concerns?  denies   Independent of bathing and dressing?  Endorsed  Does the patient needs help with medications? He is in charge   Who is in charge of the finances? He  is in charge     Any changes in appetite?  denies     Patient have trouble swallowing?  denies   Does the patient cook? Endorsed    Any kitchen accidents such as leaving the stove on? Patient denies   Any headaches?   denies   Chronic back pain  denies   Ambulates with difficulty?  denies   Recent falls or head injuries? denies     Unilateral weakness, numbness or tingling? denies   Any tremors?  denies   Any anosmia?  Patient denies   Any incontinence of urine?  He has chronic nocturia. Any bowel dysfunction?   denies      Patient lives  with wife. She is out of the country visiting family Does the patient drive? Endorsed. 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  His daughter works within "side-by-side ".   History  on Initial Assessment 08/23/2019: This is a pleasant 77 year old right-handed man with a history of hypertension, hyperlipidemia, PAD, presenting for evaluation of memory loss. He feels that memory changes started soon after his surgery in February 2020 where he had a left femoral to AK popliteal artery bypass under general anesthesia.  Prior to this, he had minor memory changes, but soon after surgery he noticed that he was unable to do things that he could do easily in the past such as math. He used to do large numbers in the back of his head, but has more difficulty now. He lives with his wife who constantly says  something about his memory saying "you forget everything," which upsets him since this was not happening prior to the surgery. He denies getting lost driving but has missed his exit a few times because he is not focusing or forgets where he is going. He has forgotten bill payments and has had late charges (new as well). He has been forgetting his medications, his wife now helps him. He loses things constantly, his phone is lost all the time. He has put things for the pantry in the freezer. He denies leaving the stove on. He has word-finding difficulties and forgets names more. 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 He has also had a change in sleep pattern, he has always had sleep difficulties getting an average of 4 hours of sleep, but since the surgery he would only get 2-3 hours of sleep. With Trazodone 1 tab qhs he gets 4 hours. He has daytime drowsiness and would drag his feet because he is so tired. Mood is good. No family history of dementia, no history of significant head injuries or alcohol use.   He gets dizzy when walking or changing clothes, no falls. He recalls an episode of double vision 3-4 years ago, this has resolved. He has occasional difficulty swallowing water. He has some numbness in both hands. He denies any headaches, focal numbness/tingling/weakness, bowel/bladder dysfunction, anosmia, or tremors. He denies any prior history of stroke, head CT done in 07/2019 showed an old left posterior parietal infarct and old left superior cerebellar lacunar infarcts, mild chronic microvascular disease, no acute change.     MRI brain showed mild diffuse atrophy and moderate chronic microvascular disease, chronic small strokes in the left occipital lobe, bilateral cerebellar hemispheres and thalami.  PREVIOUS MEDICATIONS:   CURRENT MEDICATIONS:  Outpatient Encounter  Medications as of 11/06/2022  Medication Sig   amLODipine (NORVASC) 10 MG tablet Take 1 tablet (10 mg total) by mouth daily. Annual appt due in Jan must see provider for future refills   aspirin EC 81 MG tablet Take 1 tablet (81 mg total) by mouth daily.   Blood Glucose Monitoring Suppl (ONETOUCH VERIO) w/Device KIT 1 Units by Does not apply route daily as needed.   Cholecalciferol (VITAMIN D3) 50 MCG (2000 UT) capsule Take 1 capsule (2,000 Units total) by mouth daily.   glucose blood (ONETOUCH VERIO) test strip Use to check blood sugar daily   hydrALAZINE (APRESOLINE) 25 MG tablet Take 1 tablet (25 mg total) by mouth 3 (three) times daily.   Multiple Vitamins-Minerals (MENS MULTIVITAMIN PLUS) TABS Take 1 tablet by mouth at bedtime.   nitroGLYCERIN (NITROSTAT) 0.4 MG SL tablet Place 1 tablet under tongue every 5 mins. DO NOT USE MORE  THAN 3 TABS   olmesartan (BENICAR) 5 MG tablet Take 5 mg by mouth daily.   polyethylene glycol powder (GLYCOLAX/MIRALAX) 17 GM/SCOOP powder Take 17 g by mouth 2 (two) times daily as needed for moderate constipation.   rosuvastatin (CRESTOR) 40 MG tablet TAKE 1 TABLET BY MOUTH EVERY DAY   traZODone (DESYREL) 50 MG tablet Take  1/4-1/2 tab at night as needed for insomnia   [DISCONTINUED] memantine (NAMENDA) 10 MG tablet Take 1 tablet (10 mg total) by mouth daily.   [DISCONTINUED] mirtazapine (REMERON) 15 MG tablet TAKE 1/2 TABLET EVERY NIGHT   azithromycin (ZITHROMAX Z-PAK) 250 MG tablet As directed (Patient not taking: Reported on 11/06/2022)   HYDROcodone bit-homatropine (HYCODAN) 5-1.5 MG/5ML syrup Take 5 mLs by mouth every 8 (eight) hours as needed for cough. (Patient not taking: Reported on 11/06/2022)   memantine (NAMENDA) 10 MG tablet Take 1 tablet (10 mg total) by mouth at bedtime.   tamsulosin (FLOMAX) 0.4 MG CAPS capsule Take 1 capsule (0.4 mg total) by mouth daily after supper. (Patient not taking: Reported on 11/06/2022)   No facility-administered encounter  medications on file as of 11/06/2022.       11/06/2022    6:00 PM 11/07/2021   10:00 AM 05/01/2021    1:00 PM  MMSE - Mini Mental State Exam  Orientation to time _0 Orientation to Place _1 Registration _2 Attention/ Calculation _3 Recall _4 Language- name 2 objects _5 Language- repeat _6 Language- follow 3 step command _7 Language- read & follow direction _8 Write a sentence _9 Copy design 1 0 1  Total score _10 No data to display          Objective:     PHYSICAL EXAMINATION:    VITALS:   Vitals:   11/06/22 0933  BP: (!) 149/70  Pulse: 69  Resp: 18  SpO2: 97%  Weight: 168 lb (76.2 kg)  Height: _11  (1.651 m)    GEN:  The patient appears stated age and is in NAD. HEENT:  Normocephalic, atraumatic.   Neurological examination:  General: NAD, well-groomed, appears stated age. Orientation: The patient is alert. Oriented to person, place and date Cranial nerves: There is good facial symmetry.The speech is fluent and clear. No aphasia or dysarthria. Fund of knowledge is appropriate. Recent and remote memory are impaired. Attention and concentration are normal  Able to name objects and repeat phrases.  Hearing is intact to conversational tone.    Sensation: Sensation is intact to light touch throughout Motor: Strength is at least antigravity x4. DTR's 2/4 in UE/LE     Movement examination: Tone: There is normal tone in the UE/LE Abnormal movements:  no tremor.  No myoclonus.  No asterixis.   Coordination:  There is no decremation with RAM's. Normal finger to nose  Gait and Station: The patient has no difficulty arising out of a deep-seated chair without the use of the hands. The patient's stride length is good.  Gait is cautious and narrow.    Thank you for allowing [place] the opportunity to participate in the care of this nice patient. Please do not hesitate to contact us for any questions or concerns.   Total time  spent on today's visit was 35 minutes dedicated to this patient today, preparing  to see patient, examining the patient, ordering tests and/or medications and counseling the patient, documenting clinical information in the EHR or other health record, independently interpreting results and communicating results to the patient/family, discussing treatment and goals, answering patient's questions and coordinating care.  Cc:  Plotnikov, [name], MD  Sharene Butters 11/06/2022 6:33 PM

## 2022-11-06 ENCOUNTER — Encounter: Payer: Self-pay | Admitting: Physician Assistant

## 2022-11-06 ENCOUNTER — Ambulatory Visit (INDEPENDENT_AMBULATORY_CARE_PROVIDER_SITE_OTHER): Payer: PPO | Admitting: Physician Assistant

## 2022-11-06 VITALS — BP 149/70 | HR 69 | Resp 18 | Ht 65.0 in | Wt 168.0 lb

## 2022-11-06 DIAGNOSIS — R413 Other amnesia: Secondary | ICD-10-CM | POA: Diagnosis not present

## 2022-11-06 MED ORDER — MEMANTINE HCL 10 MG PO TABS: 10.0000 mg | ORAL_TABLET | Freq: Every evening | ORAL | 3 refills | Status: DC

## 2022-11-06 MED ORDER — TRAZODONE HCL 50 MG PO TABS: ORAL_TABLET | ORAL | 3 refills | Status: DC

## 2022-11-06 NOTE — Patient Instructions (Addendum)
It was a pleasure to see you today at our office.   Recommendations:  Follow up in 6  months Continue Memantine 10 mg at night . Side effects were discussed  Follow up sleep apnea  Start trazodone 50 Unknown , take 1/4 tab (12.5) to 1/2 tab (25 mg) at night for sleep as needed   Whom to call:  Memory  decline, memory medications: Call our office [phone]   For psychiatric meds, mood meds: Please have your primary care physician manage these medications.      For assessment of decision of mental capacity and competency:  Call Dr. Anthoney Harada, geriatric psychiatrist at [phone]  For guidance in geriatric dementia issues please call [facility] [phone]  For guidance regarding [facility] and if placement were needed at the facility, contact Arnell Asal, Social Worker tel: [phone]  If you have any severe symptoms of a stroke, or other severe issues such as confusion,severe chills or fever, etc call 911 or go to the ER as you may need to be evaluated further   Feel free to visit Facebook page " [employer]" for tips of how to care for people with memory problems.    Feel free to go to the following database for funded clinical studies conducted around the world: http://saunders.com/   https://www.triadclinicaltrials.com/   If you are interested in the AHEAD study or other ongoing studies for Alzheimer's disease feel free to contact to:   [facility] [place] Organization Phone: [phone] [email or web address]       RECOMMENDATIONS FOR ALL PATIENTS WITH MEMORY PROBLEMS: 1. Continue to exercise (Recommend 30 minutes of walking everyday, or 3 hours every week) 2. Increase social interactions - continue going to [place] and enjoy social gatherings with friends and family 3. Eat healthy, avoid fried foods and eat more fruits and  vegetables 4. Maintain adequate blood pressure, blood sugar, and blood cholesterol level. Reducing the risk of stroke and cardiovascular disease also helps promoting better memory. 5. Avoid stressful situations. Live a simple life and avoid aggravations. Organize your time and prepare for the next day in anticipation. 6. Sleep well, avoid any interruptions of sleep and avoid any distractions in the bedroom that may interfere with adequate sleep quality 7. Avoid sugar, avoid sweets as there is a strong link between excessive sugar intake, diabetes, and cognitive impairment We discussed the Mediterranean diet, which has been shown to help patients reduce the risk of progressive memory disorders and reduces cardiovascular risk. This includes eating fish, eat fruits and green leafy vegetables, nuts like almonds and hazelnuts, walnuts, and also use olive oil. Avoid fast foods and fried foods as much as possible. Avoid sweets and sugar as sugar use has been linked to worsening of memory function.  There is always a concern of gradual progression of memory problems. If this is the case, then we may need to adjust level of care according to patient needs. Support, both to the patient and caregiver, should then be put into place.    FALL PRECAUTIONS: Be cautious when walking. Scan the area for obstacles that may increase the risk of trips and falls. When getting up in the mornings, sit up at the edge of the bed for a few minutes before getting out of bed. Consider elevating the bed at the head end to avoid drop of blood pressure when getting up. Walk always in a well-lit room (use night lights in  the walls). Avoid area rugs or power cords from appliances in the middle of the walkways. Use a walker or a cane if necessary and consider physical therapy for balance exercise. Get your eyesight checked regularly.  FINANCIAL OVERSIGHT: Supervision, especially oversight when making financial decisions or transactions is  also recommended.  HOME SAFETY: Consider the safety of the kitchen when operating appliances like stoves, microwave oven, and blender. Consider having supervision and share cooking responsibilities until no longer able to participate in those. Accidents with firearms and other hazards in the house should be identified and addressed as well.   ABILITY TO BE LEFT ALONE: If patient is unable to contact 911 operator, consider using LifeLine, or when the need is there, arrange for someone to stay with patients. Smoking is a fire hazard, consider supervision or cessation. Risk of wandering should be assessed by caregiver and if detected at any point, supervision and safe proof recommendations should be instituted.  MEDICATION SUPERVISION: Inability to self-administer medication needs to be constantly addressed. Implement a mechanism to ensure safe administration of the medications.   DRIVING: Regarding driving, in patients with progressive memory problems, driving will be impaired. We advise to have someone else do the driving if trouble finding directions or if minor accidents are reported. Independent driving assessment is available to determine safety of driving.   If you are interested in the driving assessment, you can contact the following:  [employer] in [place]  [place] [phone]  [place]  [facility] [phone] or [phone]

## 2022-12-17 ENCOUNTER — Other Ambulatory Visit: Payer: Self-pay | Admitting: General Practice

## 2023-01-05 ENCOUNTER — Other Ambulatory Visit: Payer: Self-pay | Admitting: Physician Assistant

## 2023-01-10 ENCOUNTER — Other Ambulatory Visit: Payer: Self-pay | Admitting: General Practice

## 2023-01-11 ENCOUNTER — Encounter: Payer: Self-pay | Admitting: Internal Medicine

## 2023-01-11 ENCOUNTER — Ambulatory Visit: Payer: PPO

## 2023-01-11 ENCOUNTER — Ambulatory Visit (INDEPENDENT_AMBULATORY_CARE_PROVIDER_SITE_OTHER): Payer: PPO | Admitting: Internal Medicine

## 2023-01-11 VITALS — BP 108/68 | HR 62 | Temp 98.3°F | Ht 65.0 in | Wt 169.0 lb

## 2023-01-11 DIAGNOSIS — Z111 Encounter for screening for respiratory tuberculosis: Secondary | ICD-10-CM | POA: Diagnosis not present

## 2023-01-11 DIAGNOSIS — I10 Essential (primary) hypertension: Secondary | ICD-10-CM

## 2023-01-11 DIAGNOSIS — R059 Cough, unspecified: Secondary | ICD-10-CM

## 2023-01-11 NOTE — Progress Notes (Signed)
Subjective:    Patient ID: Harold Waters, male    DOB: 1945/12/18, 77 y.o.   MRN: [id number]      HPI Harold Waters is here for  Chief Complaint  Patient presents with   Cough    Cough x 6 months off and on    He has a cough - feels something in the throat.  That makes him cough.  He had a cold 6 months ago - the zpak and cough syrup helped.   His most recent episode of cough started 4 weeks ago.  It is a dry cough.  He feels mucus in the throat, but nothing comes up.  He had no other symptoms.  He does have hoarseness with the cough. The only thing that has stopped the cough - he drinks warm water in the morning and at night and as needed and that helps.   The cough was worse during the night.  He did have 1 episode that he noticed when he drank cold water and thinks he cannot drink anything cold because of that.  That specific time he was also eating pizza.  Right now he does not have a cough.  He was still concerned about it and wanted to have it checked out.  He denies reflux  He needs a TB test for work  Medications and allergies reviewed with patient and updated if appropriate.  Current Outpatient Medications on File Prior to Visit  Medication Sig Dispense Refill   amLODipine (NORVASC) 10 MG tablet Take 1 tablet (10 mg total) by mouth daily. Annual appt due in Jan must see provider for future refills 90 tablet 1   aspirin EC 81 MG tablet Take 1 tablet (81 mg total) by mouth daily. 100 tablet 3   Blood Glucose Monitoring Suppl (ONETOUCH VERIO) w/Device KIT 1 Units by Does not apply route daily as needed. 1 kit 1   Cholecalciferol (VITAMIN D3) 50 MCG (2000 UT) capsule Take 1 capsule (2,000 Units total) by mouth daily. 100 capsule 3   glucose blood (ONETOUCH VERIO) test strip Use to check blood sugar daily 50 each 11   hydrALAZINE (APRESOLINE) 25 MG tablet Take 1 tablet (25 mg total) by mouth 3 (three) times daily. 270 tablet 1   memantine (NAMENDA) 10 MG tablet Take 1  tablet (10 mg total) by mouth at bedtime. 90 tablet 3   Multiple Vitamins-Minerals (MENS MULTIVITAMIN PLUS) TABS Take 1 tablet by mouth at bedtime.     nitroGLYCERIN (NITROSTAT) 0.4 MG SL tablet Place 1 tablet under tongue every 5 mins. DO NOT USE MORE THAN 3 TABS 25 tablet 5   olmesartan (BENICAR) 5 MG tablet Take 5 mg by mouth daily.     polyethylene glycol powder (GLYCOLAX/MIRALAX) 17 GM/SCOOP powder Take 17 g by mouth 2 (two) times daily as needed for moderate constipation. 500 g 3   rosuvastatin (CRESTOR) 40 MG tablet TAKE 1 TABLET BY MOUTH EVERY DAY 14 tablet 0   traZODone (DESYREL) 50 MG tablet TAKE 1/4 TO 1/2 TABLET BY MOUTH AT NIGHT AS NEEDED FOR INSOMNIA 45 tablet 3   tamsulosin (FLOMAX) 0.4 MG CAPS capsule Take 1 capsule (0.4 mg total) by mouth daily after supper. (Patient not taking: Reported on 11/06/2022) 30 capsule 5   No current facility-administered medications on file prior to visit.    Review of Systems  Constitutional:  Negative for chills, diaphoresis, fever and unexpected weight change.  HENT:  Negative for congestion, ear pain, sinus  pressure, sinus pain, sore throat and trouble swallowing.   Respiratory:  Positive for cough and wheezing (with the cough). Negative for shortness of breath.   Cardiovascular:  Negative for chest pain.  Gastrointestinal:  Negative for nausea.       No gerd       Objective:   Vitals:   01/11/23 1329  BP: 108/68  Pulse: 62  Temp: 98.3 F (36.8 C)  SpO2: 98%   BP Readings from Last 3 Encounters:  01/11/23 108/68  11/06/22 (!) 149/70  10/15/22 130/62   Wt Readings from Last 3 Encounters:  01/11/23 169 lb (76.7 kg)  11/06/22 168 lb (76.2 kg)  10/15/22 165 lb 12.8 oz (75.2 kg)   Body mass index is 28.12 kg/m.    Physical Exam Constitutional:      General: He is not in acute distress.    Appearance: Normal appearance. He is not ill-appearing.  HENT:     Head: Normocephalic and atraumatic.     Mouth/Throat:     Mouth:  Mucous membranes are moist.     Pharynx: No oropharyngeal exudate or posterior oropharyngeal erythema.  Cardiovascular:     Rate and Rhythm: Normal rate and regular rhythm.     Heart sounds: Murmur (2/6 systolic) heard.  Pulmonary:     Effort: Pulmonary effort is normal. No respiratory distress.     Breath sounds: Normal breath sounds. No wheezing or rales.  Abdominal:     General: There is no distension.     Palpations: Abdomen is soft.     Tenderness: There is no abdominal tenderness.  Skin:    General: Skin is warm and dry.  Neurological:     Mental Status: He is alert.        DG Chest 2 View CLINICAL DATA:  Cough for 1 week  EXAM: CHEST - 2 VIEW  COMPARISON:  11/29/2016  FINDINGS: Stable cardiomediastinal silhouette. Aortic atherosclerotic calcification. No focal consolidation, pleural effusion, or pneumothorax. No acute osseous abnormality.  IMPRESSION: No active cardiopulmonary disease.  Electronically Signed   By: Placido Sou M.D.   On: 10/18/2022 00:15      Assessment & Plan:    See Problem List for Assessment and Plan of chronic medical problems.   Hypertension: Chronic Blood pressure well-controlled here today Continue amlodipine 10 mg daily, hydralazine 25 mg 3 times daily, olmesartan 5 mg daily

## 2023-01-11 NOTE — Assessment & Plan Note (Signed)
Had a cough 6 months ago-improved with Z-Pak, Hycodan 4 weeks ago cough recurred without cold symptoms, but did have some hoarseness.  Hycodan was not effective.  Worse with drinking cold beverages.  Drinking warm water twice daily and as needed resolved the cough Concerned about recurring cough-currently does not have a cough No evidence of infection-cough has resolved so no need for further evaluation By history his most recent cough sounds more like it secondary to GERD, which she denies and does not feel like that is possible Can continue to drink warm water twice daily and as needed Advised follow-up if cough recurs

## 2023-01-11 NOTE — Patient Instructions (Addendum)
     Keep drinking the warm water.    Heartburn may be the cause of the cough - monitor for any heartburn.    Medications changes include : none      Return if symptoms worsen or fail to improve.

## 2023-01-13 ENCOUNTER — Encounter: Payer: Self-pay | Admitting: Internal Medicine

## 2023-01-13 ENCOUNTER — Ambulatory Visit (INDEPENDENT_AMBULATORY_CARE_PROVIDER_SITE_OTHER): Payer: PPO | Admitting: *Deleted

## 2023-01-13 DIAGNOSIS — Z111 Encounter for screening for respiratory tuberculosis: Secondary | ICD-10-CM | POA: Diagnosis not present

## 2023-01-18 ENCOUNTER — Other Ambulatory Visit: Payer: Self-pay | Admitting: General Practice

## 2023-01-25 ENCOUNTER — Encounter: Payer: Self-pay | Admitting: Internal Medicine

## 2023-02-03 DIAGNOSIS — Z8546 Personal history of malignant neoplasm of prostate: Secondary | ICD-10-CM | POA: Diagnosis not present

## 2023-02-09 IMAGING — US US RENAL ARTERY STENOSIS
1 series · 13 of 25 positions shown · non-contrast
Comparison: None.

CLINICAL DATA: Stage III B chronic kidney disease and hypertension.

EXAM:
RENAL/URINARY TRACT ULTRASOUND
RENAL DUPLEX DOPPLER ULTRASOUND

[Series 1: us renal artery stenosis · 0.23mm/px · 13 of 57 slices shown]
[im 1/57]
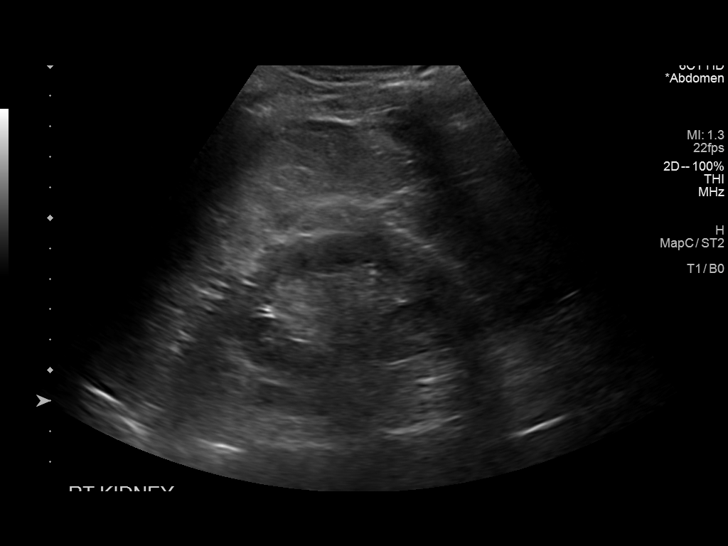
[im 5/57]
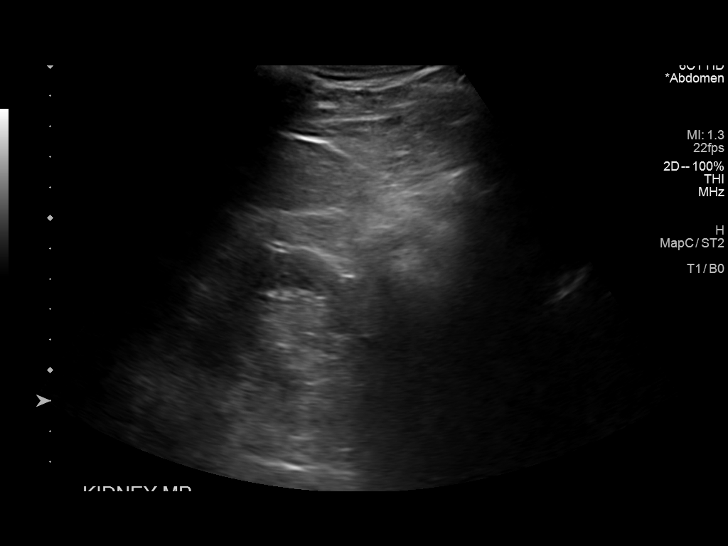
[im 10/57]
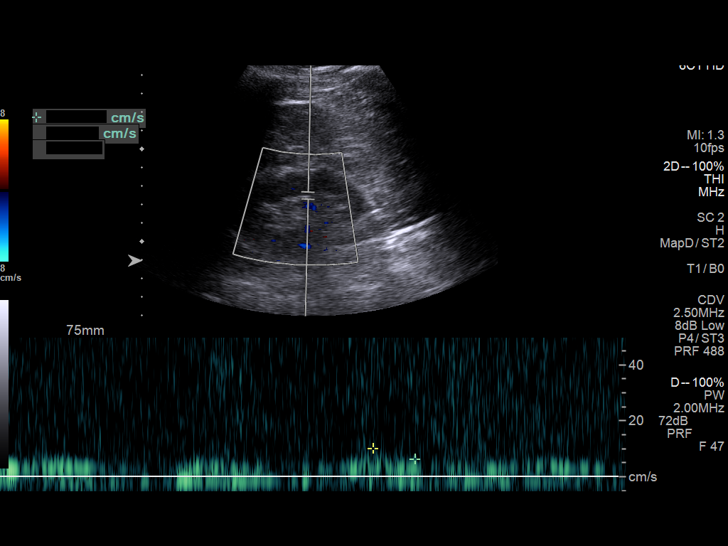
[im 15/57]
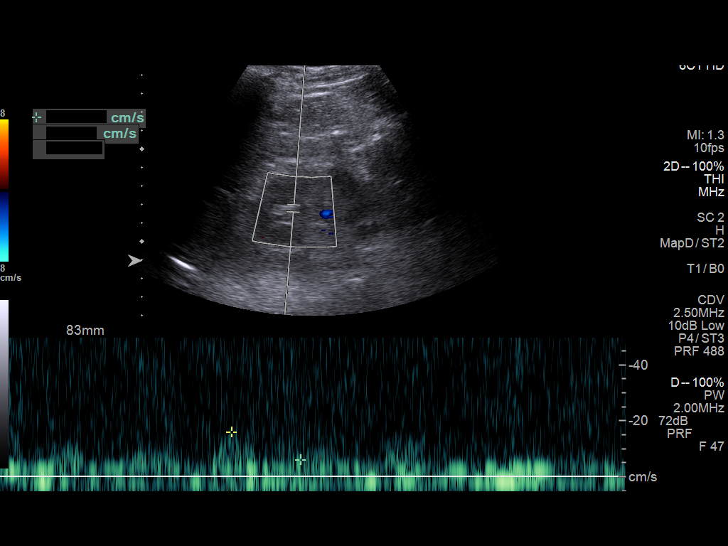
[im 19/57]
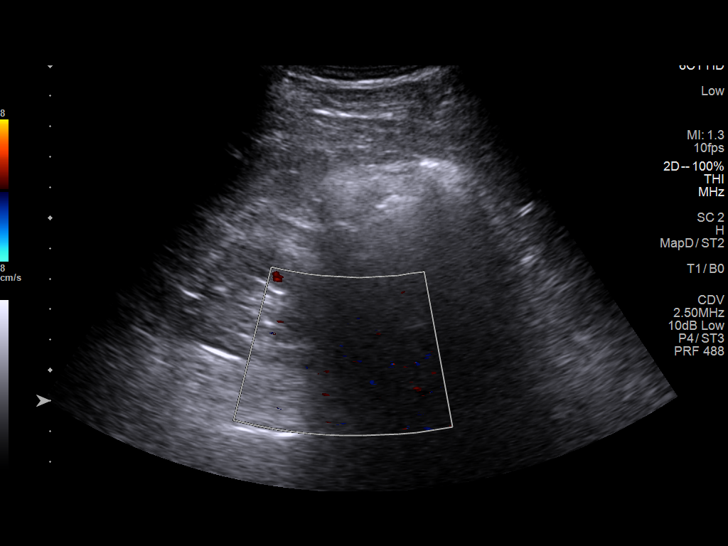
[im 24/57]
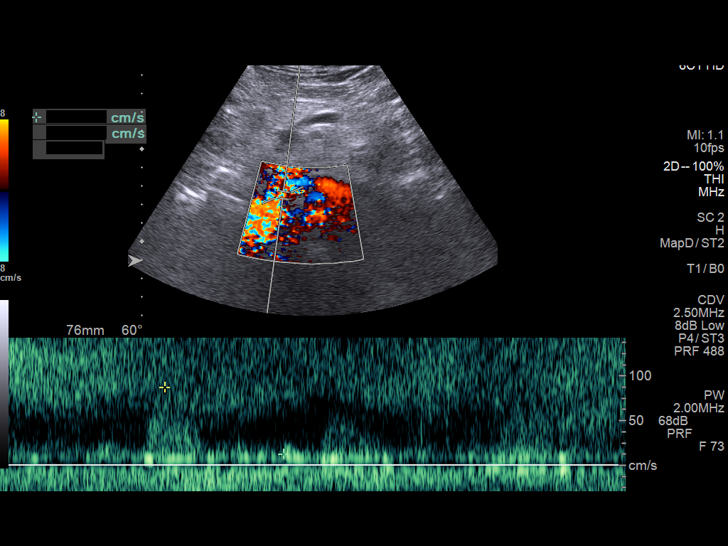
[im 29/57]
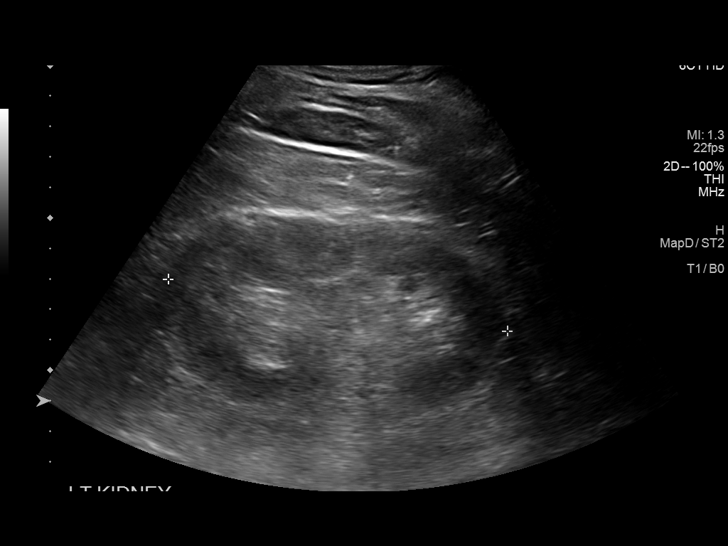
[im 33/57]
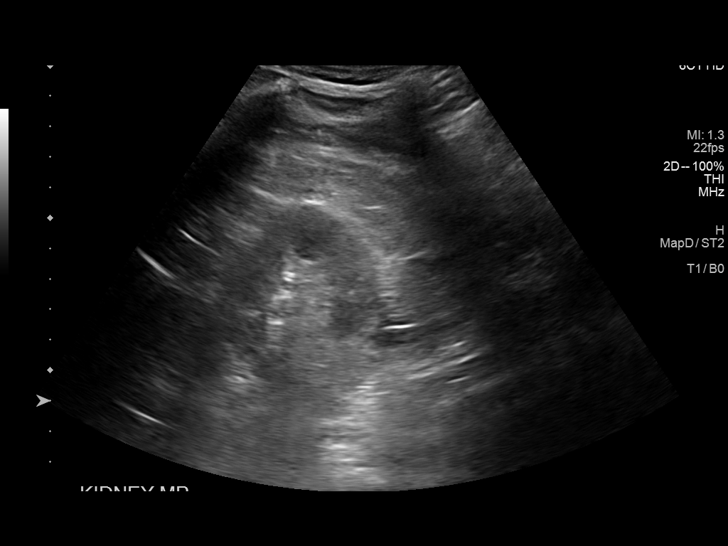
[im 38/57]
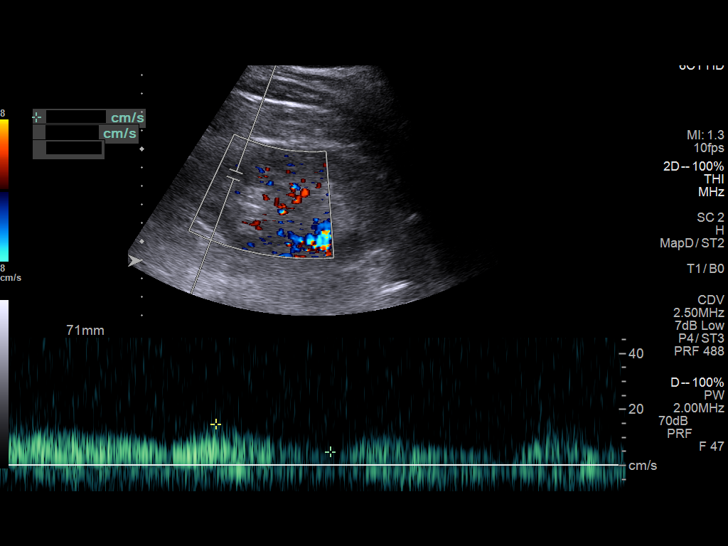
[im 43/57]
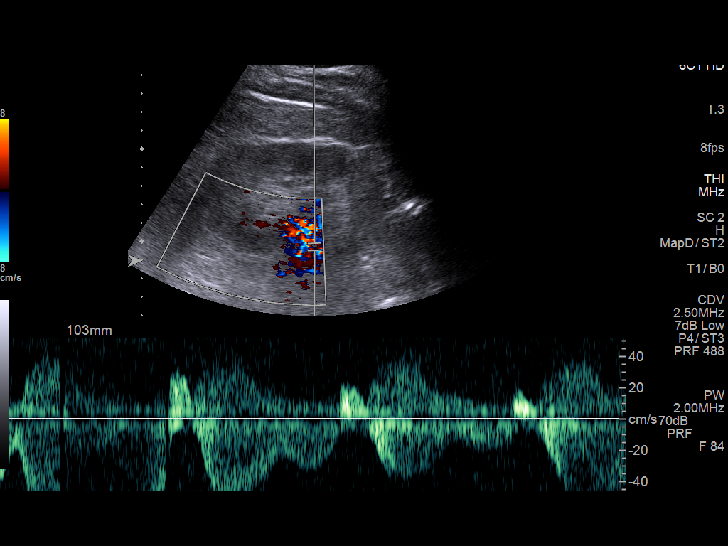
[im 47/57]
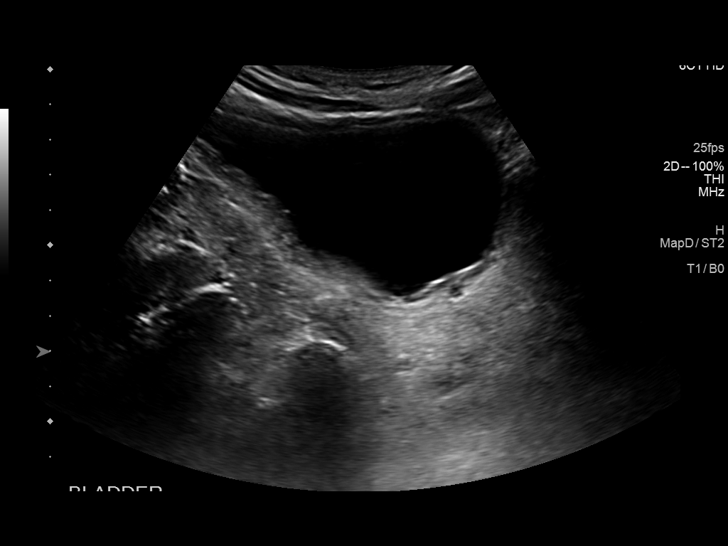
[im 52/57]
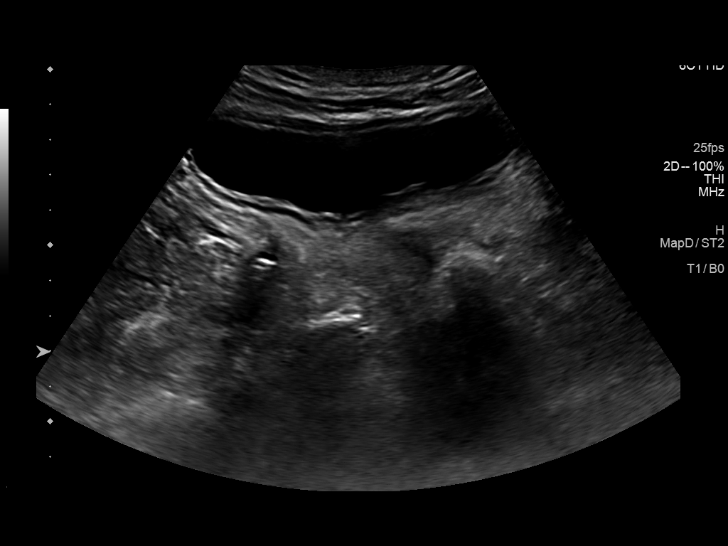
[im 57/57]
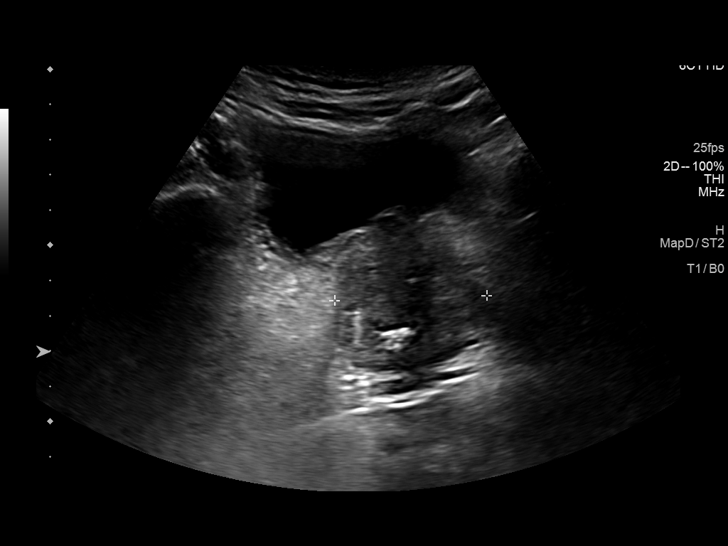

[13 of 25 positions shown; findings below may reference images not displayed]

FINDINGS: Right Kidney:

Length: 8.6 x 4.4 x 3.8 cm. Estimated volume of 75 mL. Mildly
increased cortical echogenicity. No mass or hydronephrosis
visualized.

Left Kidney:

Length: 10.6 x 6.0 x 4.3 cm. Estimated volume of 142 mL. Increased
cortical echogenicity. No mass or hydronephrosis visualized.

Bladder:  Mildly distended.  Unremarkable in appearance.

RENAL DUPLEX ULTRASOUND

Right Renal Artery Velocities:

Origin:  87 cm/sec

Mid:  49 cm/sec

Hilum:  40 cm/sec

Interlobar:  16 cm/sec

Arcuate:  10 cm/sec

Left Renal Artery Velocities:

Origin:  75 cm/sec

Mid:  75 cm/sec

Hilum:  67 cm/sec

Interlobar:  28 cm/sec

Arcuate:  21 cm/sec

Aortic Velocity:  116 cm/sec

Right Renal-Aortic Ratios:

Origin:

Mid:

Hilum:

Interlobar:

Arcuate:

Left Renal-Aortic Ratios:

Origin:

Mid:

Hilum:

Interlobar:

Arcuate:

Renal artery waveforms are normal bilaterally. Bilateral renal veins
are normally patent.
IMPRESSION: 1. Smaller right kidney relative to the left by volume. Both kidneys
demonstrate mildly increased cortical echogenicity consistent with
chronic kidney disease. No evidence of hydronephrosis bilaterally.
2. Unremarkable renal duplex evaluation demonstrating no evidence to
suggest significant renal artery stenosis bilaterally.

## 2023-02-17 ENCOUNTER — Ambulatory Visit (INDEPENDENT_AMBULATORY_CARE_PROVIDER_SITE_OTHER)
Admission: RE | Admit: 2023-02-17 | Discharge: 2023-02-17 | Disposition: A | Discharge status: Home / Self Care | Payer: PPO | Source: Ambulatory Visit | Attending: Surgery | Admitting: Surgery

## 2023-02-17 ENCOUNTER — Ambulatory Visit (HOSPITAL_COMMUNITY)
Admission: RE | Admit: 2023-02-17 | Discharge: 2023-02-17 | Disposition: A | Discharge status: Home / Self Care | Payer: PPO | Source: Ambulatory Visit | Attending: Vascular Surgery | Admitting: Vascular Surgery

## 2023-02-17 DIAGNOSIS — R600 Localized edema: Secondary | ICD-10-CM | POA: Insufficient documentation

## 2023-02-17 DIAGNOSIS — I70213 Atherosclerosis of native arteries of extremities with intermittent claudication, bilateral legs: Secondary | ICD-10-CM

## 2023-02-17 DIAGNOSIS — I739 Peripheral vascular disease, unspecified: Secondary | ICD-10-CM | POA: Diagnosis not present

## 2023-02-17 LAB — VAS US ABI WITH/WO TBI
Left ABI: 0.98
Right ABI: 1.06

## 2023-03-03 ENCOUNTER — Ambulatory Visit (INDEPENDENT_AMBULATORY_CARE_PROVIDER_SITE_OTHER): Payer: PPO | Admitting: Physician Assistant

## 2023-03-03 ENCOUNTER — Other Ambulatory Visit: Payer: Self-pay | Admitting: Internal Medicine

## 2023-03-03 VITALS — BP 133/66 | HR 63 | Temp 97.9°F | Resp 20 | Ht 65.0 in | Wt 169.0 lb

## 2023-03-03 DIAGNOSIS — T82858A Stenosis of vascular prosthetic devices, implants and grafts, initial encounter: Secondary | ICD-10-CM

## 2023-03-03 DIAGNOSIS — I739 Peripheral vascular disease, unspecified: Secondary | ICD-10-CM

## 2023-03-03 DIAGNOSIS — M5416 Radiculopathy, lumbar region: Secondary | ICD-10-CM | POA: Diagnosis not present

## 2023-03-03 MED ORDER — ASPIRIN 81 MG PO TBEC: 81.0000 mg | DELAYED_RELEASE_TABLET | Freq: Every day | ORAL | 0 refills | Status: DC

## 2023-03-03 NOTE — Addendum Note (Signed)
Addended by: Deatra James on: 03/03/2023 01:39 PM   Modules accepted: Orders

## 2023-03-05 ENCOUNTER — Ambulatory Visit: Payer: PPO | Attending: Internal Medicine | Admitting: Internal Medicine

## 2023-03-05 ENCOUNTER — Encounter: Payer: Self-pay | Admitting: Internal Medicine

## 2023-03-05 ENCOUNTER — Encounter: Payer: Self-pay | Admitting: Neurology

## 2023-03-05 VITALS — BP 110/58 | HR 63 | Ht 65.0 in | Wt 170.0 lb

## 2023-03-05 DIAGNOSIS — E1169 Type 2 diabetes mellitus with other specified complication: Secondary | ICD-10-CM

## 2023-03-05 DIAGNOSIS — I351 Nonrheumatic aortic (valve) insufficiency: Secondary | ICD-10-CM

## 2023-03-05 DIAGNOSIS — I25110 Atherosclerotic heart disease of native coronary artery with unstable angina pectoris: Secondary | ICD-10-CM

## 2023-03-05 DIAGNOSIS — E785 Hyperlipidemia, unspecified: Secondary | ICD-10-CM

## 2023-03-05 DIAGNOSIS — I200 Unstable angina: Secondary | ICD-10-CM

## 2023-03-05 DIAGNOSIS — Z8546 Personal history of malignant neoplasm of prostate: Secondary | ICD-10-CM | POA: Diagnosis not present

## 2023-03-05 HISTORY — DX: Nonrheumatic aortic (valve) insufficiency: I35.1

## 2023-03-05 MED ORDER — ROSUVASTATIN CALCIUM 20 MG PO TABS: 20.0000 mg | ORAL_TABLET | Freq: Every day | ORAL | 3 refills | Status: DC

## 2023-03-05 MED ORDER — NITROGLYCERIN 0.4 MG SL SUBL: SUBLINGUAL_TABLET | SUBLINGUAL | 2 refills | Status: AC

## 2023-03-05 MED ORDER — ISOSORBIDE MONONITRATE ER 30 MG PO TB24: 30.0000 mg | ORAL_TABLET | Freq: Every day | ORAL | 3 refills | Status: DC

## 2023-03-05 NOTE — H&P (View-Only) (Signed)
  Office Note   History of Present Illness   Harold Waters is a 77 y.o. (07/02/1946) male who presents for surveillance of PAD.  He has a history of left common femoral to above-knee popliteal artery bypass with vein by Dr. Cain on 12/06/2018.  He has had intermittent edema of his left lower extremity since surgery and has been previously measured for 15 to 20 mmHg compression stockings.   At his last visit with our office he was having some pain in his left leg extending from his hip to his knee.  This was painful when bending over, when walking, or when going from sitting to standing.  At her follow-up today he is doing well.  He denies any claudication, rest pain, nonhealing wounds of the lower extremities.  His left lower extremity edema is currently well-controlled with compression stockings.  He is still having pain in his left leg extending from his hip to his knee.  This pain feels like a burning pain and bothers him when going from a sitting to standing position or when walking.  For the past 2 to 3 months he has also been experiencing the same pain in his right leg and lower back pain.  His lower back pain hurts him the most when bending over and gardening.  He denies any numbness.  Current Outpatient Medications  Medication Sig Dispense Refill   amLODipine (NORVASC) 10 MG tablet Take 1 tablet (10 mg total) by mouth daily. Annual appt due in Jan must see provider for future refills 90 tablet 1   Blood Glucose Monitoring Suppl (ONETOUCH VERIO) w/Device KIT 1 Units by Does not apply route daily as needed. 1 kit 1   Cholecalciferol (VITAMIN D3) 50 MCG (2000 UT) capsule Take 1 capsule (2,000 Units total) by mouth daily. 100 capsule 3   glucose blood (ONETOUCH VERIO) test strip Use to check blood sugar daily 50 each 11   hydrALAZINE (APRESOLINE) 25 MG tablet Take 1 tablet (25 mg total) by mouth 3 (three) times daily. 270 tablet 1   memantine (NAMENDA) 10 MG tablet Take 1 tablet (10 mg  total) by mouth at bedtime. 90 tablet 3   Multiple Vitamins-Minerals (MENS MULTIVITAMIN PLUS) TABS Take 1 tablet by mouth at bedtime.     nitroGLYCERIN (NITROSTAT) 0.4 MG SL tablet Place 1 tablet under tongue every 5 mins. DO NOT USE MORE THAN 3 TABS 25 tablet 5   olmesartan (BENICAR) 5 MG tablet Take 5 mg by mouth daily.     polyethylene glycol powder (GLYCOLAX/MIRALAX) 17 GM/SCOOP powder Take 17 g by mouth 2 (two) times daily as needed for moderate constipation. 500 g 3   rosuvastatin (CRESTOR) 40 MG tablet TAKE 1 TABLET BY MOUTH EVERY DAY 7 tablet 1   tamsulosin (FLOMAX) 0.4 MG CAPS capsule Take 1 capsule (0.4 mg total) by mouth daily after supper. 30 capsule 5   traZODone (DESYREL) 50 MG tablet TAKE 1/4 TO 1/2 TABLET BY MOUTH AT NIGHT AS NEEDED FOR INSOMNIA 45 tablet 3   aspirin EC (ASPIRIN LOW DOSE) 81 MG tablet Take 1 tablet (81 mg total) by mouth daily. Annual appt due in July must see provider for future refills 100 tablet 0   No current facility-administered medications for this visit.    REVIEW OF SYSTEMS (negative unless checked):   Cardiac:  [] Chest pain or chest pressure? [] Shortness of breath upon activity? [] Shortness of breath when lying flat? [] Irregular heart rhythm?  Vascular:  [  x] Pain in calf, thigh, or hip brought on by walking? [] Pain in feet at night that wakes you up from your sleep? [] Blood clot in your veins? [x] Leg swelling?  Pulmonary:  [] Oxygen at home? [] Productive cough? [] Wheezing?  Neurologic:  [] Sudden weakness in arms or legs? [] Sudden numbness in arms or legs? [] Sudden onset of difficult speaking or slurred speech? [] Temporary loss of vision in one eye? [] Problems with dizziness?  Gastrointestinal:  [] Blood in stool? [] Vomited blood?  Genitourinary:  [] Burning when urinating? [] Blood in urine?  Psychiatric:  [] Major depression  Hematologic:  [] Bleeding problems? [] Problems with blood  clotting?  Dermatologic:  [] Rashes or ulcers?  Constitutional:  [] Fever or chills?  Ear/Nose/Throat:  [] Change in hearing? [] Nose bleeds? [] Sore throat?  Musculoskeletal:  [x] Back pain? [x] Joint pain? [] Muscle pain?   Physical Examination   Vitals:   03/03/23 0924  BP: 133/66  Pulse: 63  Resp: 20  Temp: 97.9 F (36.6 C)  TempSrc: Temporal  SpO2: 95%  Weight: 169 lb (76.7 kg)  Height: 5' 5" (1.651 m)   Body mass index is 28.12 kg/m.  General:  WDWN in NAD; vital signs documented above Gait: Not observed HENT: WNL, normocephalic Pulmonary: normal non-labored breathing  Cardiac: regular Abdomen: soft, NT, no masses Skin: without rashes Vascular Exam/Pulses: 2+ DP pulses bilaterally. Unable to palpate popliteal pulses. 2+ femoral pulses bilaterally Extremities: without ischemic changes, without gangrene , without cellulitis; without open wounds;  Musculoskeletal: no muscle wasting or atrophy  Neurologic: A&O X 3;  No focal weakness or paresthesias are detected Psychiatric:  The pt has Normal affect.  Non-Invasive Vascular Imaging ABI (02/17/2023) R:  ABI: 1.06 (1.06),  PT: tri DP: bi TBI:  105 L:  ABI: 0.98 (1.01),  PT: tri DP: bi TBI: 0.7  LLE Bypass Duplex (02/17/2023) Left Graft #1: CFA- AK Popliteal  +--------------------+--------+---------------+--------+---------------+                     PSV cm/sStenosis       WaveformComments         +--------------------+--------+---------------+--------+---------------+  Inflow             117                    biphasic                 +--------------------+--------+---------------+--------+---------------+  Proximal Anastomosis116                    biphasic                 +--------------------+--------+---------------+--------+---------------+  Proximal Graft      91      50-70% stenosisbiphasicplaque observed  Proximal-mid graft  201                                              +--------------------+--------+---------------+--------+---------------+  Mid Graft           27                     biphasic                 +--------------------+--------+---------------+--------+---------------+  Distal Graft          25                     biphasic                 +--------------------+--------+---------------+--------+---------------+  Distal Anastomosis  46                     biphasic                 +--------------------+--------+---------------+--------+---------------+  Outflow            40                     biphasic                 +--------------------+--------+---------------+--------+---------------+   Medical Decision Making   Harold Waters is a 77 y.o. male who presents for surveillance of PAD  Based on the patient's vascular studies, his ABIs are essentially unchanged bilaterally.  His right ABI is 1.06 and left ABI 0.98. Duplex of his left common femoral to above-knee popliteal artery bypass demonstrates a patent bypass with decreased velocities below 40cm/s in the mid and distal portions of the graft.  This is directly after a 50 to 70% stenosis noted in the proximal graft with elevated velocity of 201cm/s He denies any rest pain, claudication, nonhealing wounds of lower extremities.  He has palpable DP pulses bilaterally.  He also has palpable femoral pulses I explained to the patient that he will likely need left lower extremity angiogram with treatment of his bypass graft.  Proximal graft stenosis with reduced velocities below 40cm/s threatens viability of the graft.  He is agreeable to this We will get the patient scheduled for abdominal aortogram with left lower extremity runoff and possible treatment of left common femoral to above-knee popliteal artery bypass graft in the next 2 weeks with Dr. Cain. He can continue his aspirin and statin. The patient has also been dealing with worsening pain in his legs  radiating from his hips down to his knees, exacerbated with bending over and movements from sitting to standing.  Given that the patient has palpable femoral pulses I do not think his pain is vascular in nature.  He also reports lower back pain.  Potentially he is dealing with some sciatica or lumbar spine issues.  I will refer him to an orthopedic doctor for evaluation   Skylarr Liz PA-C [facility] Office: [phone]  Clinic MD: Cain 

## 2023-03-05 NOTE — Progress Notes (Signed)
Cardiology Office Note:    Date:  03/05/2023   ID:  Harold Waters, DOB 27-Jan-1946, MRN [id number]  PCP:  Tresa Garter, MD   [facility] Providers Cardiologist:  Tonny Bollman, MD     Referring MD: Tresa Garter, MD   CC: Establish care CAD, PAD Consulted for the evaluation of CAD at the behest of Plotnikov   History of Present Illness:    Harold Waters is a 77 y.o. male with a hx of coronary artery disease, prior stroke, and peripheral arterial disease (prior SFA stenting 2011, 2018) seen by Drs. Pamala Duffel, and Farley. With prior bypass.  Patient notes that he is doing poorly.   Since last visit notes that he is able to do most ADLs.  Is fairly active per patient and daugther. His nephrologist is weaning off his norvasc and hydralazine and his BP is well controlled. He has nephrology f/u next week  There are no interval hospital/ED visit.   He has LE swelling and is getting a angiogram with Dr. Randie Heinz later this month.  He notes that he has chest pain that is a sharp pain.  No change with exertion.  It radiates to his left arm .  It has been present for at least three weeks.  No SOB/DOE and no PND/Orthopnea.  No weight gain or leg swelling.  No palpitations or syncope .  Past Medical History:  Diagnosis Date   Angina    Chronic stomach ulcer    "get them off and on" (09/19/2018)   Coronary atherosclerosis of unspecified type of vessel, native or graft    Diverticulosis    DVT of lower extremity (deep venous thrombosis) (HCC) ~ 2010   LLE   Erosive gastritis    Essential hypertension, benign    GERD (gastroesophageal reflux disease)    Headache(784.0) 02/18/12   "lately"   History of kidney stones    Internal hemorrhoids    Lower back pain    Myocardial infarction (HCC) 1997   Osteoarthritis    Other and unspecified hyperlipidemia    PAD (peripheral artery disease) (HCC)    with ABI's 0.8 on the right and 0.86 on the left    Pneumonia 1957   PSVT (paroxysmal supraventricular tachycardia) 02/18/12   Shortness of breath    "lying down"   Sleep apnea    does not wear c-pap; pt does not recall this hx on 09/19/2018    Past Surgical History:  Procedure Laterality Date   ARTHROSCOPY KNEE W/ DRILLING Right ~ 2001   BYPASS GRAFT Left 12/05/2018   femoral popliteal    CARDIAC CATHETERIZATION  09/19/2018   CATARACT EXTRACTION     COLONOSCOPY     CORONARY ANGIOPLASTY WITH STENT PLACEMENT  1997   "2"   CORONARY PRESSURE/FFR STUDY  09/19/2018   CORONARY PRESSURE/FFR STUDY N/A 09/19/2018   Procedure: INTRAVASCULAR PRESSURE WIRE/FFR STUDY;  Surgeon: Runell Gess, MD;  Location: [facility] INVASIVE CV LAB;  Service: Cardiovascular;  Laterality: N/A;   FEMORAL-POPLITEAL BYPASS GRAFT Left 12/06/2018   Procedure: BYPASS GRAFT FEMORAL-POPLITEAL ARTERY;  Surgeon: Maeola Harman, MD;  Location: [facility] OR;  Service: Vascular;  Laterality: Left;   ILIAC ARTERY STENT  04/22/2017   . Placement of a 6 mm x 100 mm Viabahn covered stent left SFA   LEFT HEART CATH AND CORONARY ANGIOGRAPHY N/A 09/19/2018   Procedure: LEFT HEART CATH AND CORONARY ANGIOGRAPHY;  Surgeon: Runell Gess, MD;  Location: [facility] INVASIVE  CV LAB;  Service: Cardiovascular;  Laterality: N/A;   LEFT HEART CATHETERIZATION WITH CORONARY ANGIOGRAM N/A 02/19/2012   Procedure: LEFT HEART CATHETERIZATION WITH CORONARY ANGIOGRAM;  Surgeon: Laurey Morale, MD;  Location: [facility] CATH LAB;  Service: Cardiovascular;  Laterality: N/A;   LOWER EXTREMITY ANGIOGRAPHY Bilateral 04/22/2017   Procedure: Lower Extremity Angiography;  Surgeon: Runell Gess, MD;  Location: [facility] INVASIVE CV LAB;  Service: Cardiovascular;  Laterality: Bilateral;   LOWER EXTREMITY ANGIOGRAPHY Bilateral 09/19/2018   LOWER EXTREMITY ANGIOGRAPHY Bilateral 09/19/2018   Procedure: LOWER EXTREMITY ANGIOGRAPHY;  Surgeon: Runell Gess, MD;  Location: [facility] INVASIVE CV LAB;  Service: Cardiovascular;   Laterality: Bilateral;   PERIPHERAL ARTERIAL STENT GRAFT  09/2010   LLE   PERIPHERAL VASCULAR ATHERECTOMY Left 04/22/2017   Procedure: Peripheral Vascular Atherectomy;  Surgeon: Runell Gess, MD;  Location: [facility] INVASIVE CV LAB;  Service: Cardiovascular;  Laterality: Left;  SFA   PERIPHERAL VASCULAR INTERVENTION Left 04/22/2017   Procedure: Peripheral Vascular Intervention;  Surgeon: Runell Gess, MD;  Location: [facility] INVASIVE CV LAB;  Service: Cardiovascular;  Laterality: Left;  SFA   UPPER GASTROINTESTINAL ENDOSCOPY      Current Medications: Current Meds  Medication Sig   aspirin EC (ASPIRIN LOW DOSE) 81 MG tablet Take 1 tablet (81 mg total) by mouth daily. Annual appt due in July must see provider for future refills   Blood Glucose Monitoring Suppl (ONETOUCH VERIO) w/Device KIT 1 Units by Does not apply route daily as needed.   Cholecalciferol (VITAMIN D3) 50 MCG (2000 UT) capsule Take 1 capsule (2,000 Units total) by mouth daily.   glucose blood (ONETOUCH VERIO) test strip Use to check blood sugar daily   isosorbide mononitrate (IMDUR) 30 MG 24 hr tablet Take 1 tablet (30 mg total) by mouth daily.   memantine (NAMENDA) 10 MG tablet Take 1 tablet (10 mg total) by mouth at bedtime.   Multiple Vitamins-Minerals (MENS MULTIVITAMIN PLUS) TABS Take 1 tablet by mouth at bedtime.   olmesartan (BENICAR) 5 MG tablet Take 5 mg by mouth daily.   polyethylene glycol powder (GLYCOLAX/MIRALAX) 17 GM/SCOOP powder Take 17 g by mouth 2 (two) times daily as needed for moderate constipation.   rosuvastatin (CRESTOR) 20 MG tablet Take 1 tablet (20 mg total) by mouth daily.   tamsulosin (FLOMAX) 0.4 MG CAPS capsule Take 1 capsule (0.4 mg total) by mouth daily after supper. (Patient taking differently: Take 0.4 mg by mouth as needed.)   traZODone (DESYREL) 50 MG tablet TAKE 1/4 TO 1/2 TABLET BY MOUTH AT NIGHT AS NEEDED FOR INSOMNIA   [DISCONTINUED] nitroGLYCERIN (NITROSTAT) 0.4 MG SL tablet Place 1 tablet  under tongue every 5 mins. DO NOT USE MORE THAN 3 TABS   [DISCONTINUED] rosuvastatin (CRESTOR) 40 MG tablet TAKE 1 TABLET BY MOUTH EVERY DAY     Allergies:   Egg-derived products, Aricept [donepezil], and Coreg [carvedilol]   Social History   Socioeconomic History   Marital status: Married    Spouse name: Not on file   Number of children: 2   Years of education: Not on file   Highest education level: Not on file  Occupational History   Occupation: retired  Tobacco Use   Smoking status: Former    Packs/day: 1.00    Years: 40.00    Additional pack years: 0.00    Total pack years: 40.00    Types: Cigarettes    Quit date: 04/03/2010    Years since quitting: 12.9  Passive exposure: Never   Smokeless tobacco: Never  Vaping Use   Vaping Use: Never used  Substance and Sexual Activity   Alcohol use: Not Currently    Comment: "used to drink when I was young; quit ~ 1980's"   Drug use: Never   Sexual activity: Yes  Other Topics Concern   Not on file  Social History Narrative   Right handed   Two story home   4 children   Wife lives with him in the home   Caffeine on occasion   Social Determinants of Health   Financial Resource Strain: Low Risk  (06/08/2022)   Overall Financial Resource Strain (CARDIA)    Difficulty of Paying Living Expenses: Not hard at all  Food Insecurity: No Food Insecurity (06/08/2022)   Hunger Vital Sign    Worried About Running Out of Food in the Last Year: Never true    Ran Out of Food in the Last Year: Never true  Transportation Needs: No Transportation Needs (06/08/2022)   PRAPARE - Administrator, Civil Service (Medical): No    Lack of Transportation (Non-Medical): No  Physical Activity: Insufficiently Active (06/08/2022)   Exercise Vital Sign    Days of Exercise per Week: 3 days    Minutes of Exercise per Session: 30 min  Stress: No Stress Concern Present (06/08/2022)   [employer] of Occupational Health - Occupational Stress  Questionnaire    Feeling of Stress : Not at all  Social Connections: Moderately Integrated (06/08/2022)   Social Connection and Isolation Panel [NHANES]    Frequency of Communication with Friends and Family: Three times a week    Frequency of Social Gatherings with Friends and Family: Three times a week    Attends Religious Services: More than 4 times per year    Active Member of Clubs or Organizations: No    Attends Banker Meetings: Never    Marital Status: Married    Social: [place] comes with daugther  Family History: The patient's family history includes Coronary artery disease in his brother; Ulcers in his father. There is no history of Colon cancer, Esophageal cancer, Rectal cancer, or Stomach cancer.  ROS:   Please see the history of present illness.     All other systems reviewed and are negative.  EKGs/Labs/Other Studies Reviewed:    The following studies were reviewed today:  EKG:  EKG is  ordered today.  The ekg ordered today demonstrates  03/05/23: SR rate 63 with LVH with inferior infarct pattern   Cardiac Studies & Procedures   CARDIAC CATHETERIZATION  CARDIAC CATHETERIZATION 09/19/2018  Narrative Images from the original result were not included.   Prox RCA to Mid RCA lesion is 90% stenosed.  Ost 2nd Mrg to 2nd Mrg lesion is 99% stenosed.  Prox LAD lesion is 60% stenosed.  Ost 1st Diag lesion is 50% stenosed.   SUVAN EISENBERGER is a 77 y.o. male   [id number] LOCATION:  FACILITY: [facility] PHYSICIAN: Nanetta Batty, M.D. 03-19-1946   DATE OF PROCEDURE:  09/19/2018  DATE OF DISCHARGE:     CARDIAC CATHETERIZATION    History obtained from chart review.JAHKARI MANIS is a 77 y.o.  thin-appearing married Latino male father of 4 children, grandfather and grandchildren, referred by Dr. Excell Seltzer for peripheral vascular evaluation. I last saw him in the office 05/07/2017. He has a history of treated hyperlipidemia. He has had a remote stroke and  has had intervention on his nondominant right answer complex  coronary arteries in the past. He stopped smoking 8 years ago but did smoke a pack a day for 40 years. He had a stent placed in his left SFA November 2011 by Dr. Excell Seltzer which resulted in improvement in his claudication. Over the last several months he's had reappearance of his claudication which is now lifestyle limiting. Dopplers performed 03/22/17 show a decline in his ABI from 0.96-2.71 with what appears to be an occluded left SFA stent.  I perform peripheral angiography and endovascular intervention on him 04/22/17. He had an occluded left SFA stent which I opened up and placed a Viabahn  covered stent with an excellent result. He did have 3 vessel. His claudication has completely resolved.  Since I saw him a year ago he has developed some recurrent left calf claudication.  He had lower extremity Dopplers performed 11/24/2017 that revealed normal ABIs bilaterally with a patent stent.  In addition, he has noticed some increasing exertional chest pain and shortness of breath.     PROCEDURE DESCRIPTION:  The patient was brought to the second floor [facility] lab in the postabsorptive state. He was not premedicated . His right groin was prepped and shaved in usual sterile fashion. Xylocaine 1% was used for local anesthesia. A 5 French sheath was inserted into the right common femoral artery using standard Seldinger technique under ultrasound guidance.  A 5 French pigtail catheter was used for distal abdominal aortography.  5 French right and left Judkins diagnostic catheters were used for selective coronary angiography, subselective left internal mammary artery angiography and obtain left heart pressures.  A 5 French crossover catheter and endhole catheters were used for left lower extremity angiography with runoff.  On the pigtail was used for the entirety of the case.  Retrograde aorta, left ventricular and pullback pressures were  recorded.  The patient received Angiomax bolus followed by infusion with a therapeutic ACT demonstrated.  Using a 6 [place] XB LAD 3.5 cm guide catheter along with a 0.14 pro-water guidewire and an Assist FFR device the lesion was crossed after pressures were normalized/equalized and adenosine was administered with FFR demonstrated to be 0.82.   Peripheral vascular angiogram-  1: Abdominal aorta- small infrarenal abdominal aortic aneurysm above the iliac bifurcation 2: Left lower extremity- the mid left SFA is chronically occluded at the site of prior stenting with reconstitution of the above-the-knee popliteal by profunda femoris collaterals in the adductor canal.  There was three-vessel runoff with segmental disease in the posterior tibial artery.  Impression Mr. Facemire has three-vessel coronary disease with a high-grade nondominant RCA stent restenosis, high-grade second marginal branch in-stent restenosis, and moderate LAD diagonal branch bifurcation disease with an FFR of 0.82 suggesting borderline hemodynamic significance.  In addition, his left SFA stent is occluded above and below with three-vessel runoff and a highly diseased left posterior tibial artery.  I believe his coronary disease can be treated medically at this time and he will need left femoropopliteal bypass grafting.  I have discussed this with Drs. Cooper and Dr. Randie Heinz.  Nanetta Batty. MD, [facility] 09/19/2018 9:20 AM     Recommend Aspirin 81mg  daily for moderate CAD.  Findings Coronary Findings Diagnostic  Dominance: Left  Left Anterior Descending Prox LAD lesion is 60% stenosed.  First Diagonal Branch Ost 1st Diag lesion is 50% stenosed.  Left Circumflex  Second Obtuse Marginal Branch Ost 2nd Mrg to 2nd Mrg lesion is 99% stenosed. The lesion was previously treated.  Right Coronary Artery Prox RCA  to Mid RCA lesion is 90% stenosed. The lesion was previously treated.  Intervention  No interventions have been  documented.   STRESS TESTS  MYOCARDIAL PERFUSION IMAGING 09/01/2018  Narrative  The left ventricular ejection fraction is mildly decreased (45-54%).  Nuclear stress EF: 46%.  Defect 1: There is a medium defect of mild severity present in the basal inferolateral and mid inferolateral location.  There was no ST segment deviation noted during stress.  No T wave inversion was noted during stress.  Low risk stress nuclear study with a mild fixed inferolateral perfusion defect and mildly depressed left ventricular systolic function. No reversible ischemia is seen.   ECHOCARDIOGRAM  ECHOCARDIOGRAM COMPLETE 08/19/2018  Narrative *[facility]* [address] [place], [place] [address] [phone]  ------------------------------------------------------------------- Transthoracic Echocardiography  Patient:    Aideen, Mediate MR #:       [id number] Study Date: 08/19/2018 Gender:     M Age:        72 Height:     165.1 cm Weight:     77.4 kg BSA:        1.9 m^2 Pt. Status: Room:  SONOGRAPHER  Junious Dresser, RDCS ATTENDING    Nanetta Batty, MD ORDERING     Nanetta Batty, MD REFERRING    Nanetta Batty, MD PERFORMING   [facility], Outpatient  cc:  ------------------------------------------------------------------- LV EF: 60% -   65%  ------------------------------------------------------------------- Indications:      Precordial pain (R07.2).  ------------------------------------------------------------------- History:   PMH:  PAD. Acquired from the patient and from the patient&'s chart.  Coronary artery disease.  Stroke.  PMH: Myocardial infarction.  Risk factors:  Former tobacco use. Hypertension. Diabetes mellitus. Dyslipidemia.  ------------------------------------------------------------------- Study Conclusions  - Left ventricle: The cavity size was normal. There was mild concentric hypertrophy. Systolic function was normal. The estimated ejection  fraction was in the range of 60% to 65%. Wall motion was normal; there were no regional wall motion abnormalities. Doppler parameters are consistent with abnormal left ventricular relaxation (grade 1 diastolic dysfunction). Doppler parameters are consistent with elevated ventricular end-diastolic filling pressure. - Aortic valve: Trileaflet; mildly thickened, mildly calcified leaflets. There was mild regurgitation. - Aortic root: The aortic root was normal in size. - Mitral valve: There was mild regurgitation. - Left atrium: The atrium was normal in size. - Right ventricle: Systolic function was normal. - Right atrium: The atrium was normal in size. - Tricuspid valve: There was mild regurgitation. - Pulmonic valve: There was mild regurgitation. - Pulmonary arteries: Systolic pressure was within the normal range. - Inferior vena cava: The vessel was normal in size. - Pericardium, extracardiac: There was no pericardial effusion.  ------------------------------------------------------------------- Labs, prior tests, procedures, and surgery: Echocardiography (2018).     EF was 65% and PA pressure was 29 (systolic).  Catheterization.    The study demonstrated coronary artery disease. There was a stenosis which was treated with a stent.  ------------------------------------------------------------------- Study data:  Comparison was made to the study of 2018.  Study status:  Routine.  Procedure:  The patient reported no pain pre or post test. Transthoracic echocardiography for diagnosis, for left ventricular function evaluation, for right ventricular function evaluation, and for assessment of valvular function. Image quality was adequate.  Study completion:  There were no complications. Transthoracic echocardiography.  M-mode, complete 2D, spectral Doppler, and color Doppler.  Birthdate:  Patient birthdate: [birth year].  Age:  Patient is 77 yr old.  Sex:  Gender: male. BMI: 28.4 kg/m^2.   Blood pressure:  140/82  Patient status: Outpatient.  Study date:  Study date: 08/19/2018. Study time: 01:05 PM.  Location:  [facility]  -------------------------------------------------------------------  ------------------------------------------------------------------- Left ventricle:  GLS: -18%. The cavity size was normal. There was mild concentric hypertrophy. Systolic function was normal. The estimated ejection fraction was in the range of 60% to 65%. Wall motion was normal; there were no regional wall motion abnormalities. Doppler parameters are consistent with abnormal left ventricular relaxation (grade 1 diastolic dysfunction). Doppler parameters are consistent with elevated ventricular end-diastolic filling pressure.  ------------------------------------------------------------------- Aortic valve:   Trileaflet; mildly thickened, mildly calcified leaflets. Mobility was not restricted. Sclerosis without stenosis. Doppler:  Transvalvular velocity was minimally increased. There was no stenosis. There was mild regurgitation.  ------------------------------------------------------------------- Aorta:  Aortic root: The aortic root was normal in size.  ------------------------------------------------------------------- Mitral valve:   Structurally normal valve.   Mobility was not restricted.  Doppler:  Transvalvular velocity was within the normal range. There was no evidence for stenosis. There was mild regurgitation.    Peak gradient (D): 2 mm Hg.  ------------------------------------------------------------------- Left atrium:  The atrium was normal in size.  ------------------------------------------------------------------- Right ventricle:  The cavity size was normal. Wall thickness was normal. Systolic function was normal.  ------------------------------------------------------------------- Pulmonic valve:    Structurally normal valve.   Cusp separation  was normal.  Doppler:  Transvalvular velocity was within the normal range. There was no evidence for stenosis. There was mild regurgitation.  ------------------------------------------------------------------- Tricuspid valve:   Structurally normal valve.    Doppler: Transvalvular velocity was within the normal range. There was mild regurgitation.  ------------------------------------------------------------------- Pulmonary artery:   The main pulmonary artery was normal-sized. Systolic pressure was within the normal range.  ------------------------------------------------------------------- Right atrium:  The atrium was normal in size.  ------------------------------------------------------------------- Pericardium:  There was no pericardial effusion.  ------------------------------------------------------------------- Systemic veins: Inferior vena cava: The vessel was normal in size.  ------------------------------------------------------------------- Measurements  Left ventricle                          Value        Reference LV ID, ED, PLAX chordal          (L)    42.8  mm     43 - 52 LV ID, ES, PLAX chordal                 29.4  mm     23 - 38 LV fx shortening, PLAX chordal          31    %      >=29 LV PW thickness, ED                     11    mm     --------- IVS/LV PW ratio, ED                     0.87         <=1.3 Stroke volume, 2D                       81    ml     --------- Stroke volume/bsa, 2D                   43    ml/m^2 --------- LV e&', lateral  7.07  cm/s   --------- LV E/e&', lateral                        10.64        --------- LV e&', medial                           5.77  cm/s   --------- LV E/e&', medial                         13.03        --------- LV e&', average                          6.42  cm/s   --------- LV E/e&', average                        11.71        --------- Longitudinal strain, TDI                18    %       ---------  Ventricular septum                      Value        Reference IVS thickness, ED                       9.54  mm     ---------  LVOT                                    Value        Reference LVOT ID, S                              21    mm     --------- LVOT area                               3.46  cm^2   --------- LVOT ID                                 21    mm     --------- LVOT peak velocity, S                   103   cm/s   --------- LVOT mean velocity, S                   67.1  cm/s   --------- LVOT VTI, S                             23.5  cm     --------- Stroke volume (SV), LVOT DP             81.4  ml     --------- Stroke index (SV/bsa), LVOT DP          42.7  ml/m^2 ---------  Aortic valve  Value        Reference Aortic regurg pressure half-time        654   ms     ---------  Aorta                                   Value        Reference Aortic root ID, ED                      31    mm     --------- Ascending aorta ID, A-P, S              34    mm     ---------  Left atrium                             Value        Reference LA ID, A-P, ES                          38    mm     --------- LA ID/bsa, A-P                          2     cm/m^2 <=2.2 LA volume, S                            31.9  ml     --------- LA volume/bsa, S                        16.7  ml/m^2 --------- LA volume, ES, 1-p A4C                  25.6  ml     --------- LA volume/bsa, ES, 1-p A4C              13.4  ml/m^2 --------- LA volume, ES, 1-p A2C                  38.9  ml     --------- LA volume/bsa, ES, 1-p A2C              20.4  ml/m^2 ---------  Mitral valve                            Value        Reference Mitral E-wave peak velocity             75.2  cm/s   --------- Mitral A-wave peak velocity             95.1  cm/s   --------- Mitral deceleration time                180   ms     150 - 230 Mitral peak gradient, D                 2     mm Hg  --------- Mitral  E/A ratio, peak                  0.8          ---------  Pulmonary arteries  Value        Reference PA pressure, S, DP                      24    mm Hg  <=30  Tricuspid valve                         Value        Reference Tricuspid regurg peak velocity          231   cm/s   --------- Tricuspid peak RV-RA gradient           21    mm Hg  ---------  Right atrium                            Value        Reference RA ID, S-I, ES                          37    mm     --------- RA ID, M-L, ES, A4C                     32.3  mm     30 - 46  Systemic veins                          Value        Reference Estimated CVP                           3     mm Hg  ---------  Right ventricle                         Value        Reference RV ID, minor axis, ED, A4C base         39.1  mm     --------- TAPSE                                   16.6  mm     --------- RV pressure, S, DP                      24    mm Hg  <=30 RV s&', lateral, S                       10.7  cm/s   ---------  Legend: (L)  and  (H)  mark values outside specified reference range.  ------------------------------------------------------------------- Prepared and Electronically Authenticated by  Tobias Alexander, M.D. 2019-10-18T17:52:19              Recent Labs: 06/01/2022: ALT 17; BUN 40; Creatinine, Ser 2.33; Hemoglobin 11.9; Platelets 191.0; Potassium 4.0; Sodium 137  Recent Lipid Panel    Component Value Date/Time   CHOL 177 06/01/2022 0919   CHOL 156 05/03/2017 0732   TRIG 177.0 (H) 06/01/2022 0919   HDL 43.20 06/01/2022 0919   HDL 46 05/03/2017 0732   CHOLHDL 4 06/01/2022 0919   VLDL 35.4 06/01/2022 0919   LDLCALC 98 06/01/2022 0919   LDLCALC 80 05/03/2017 0732   LDLDIRECT 167 (  H) 10/25/2019 1136   LDLDIRECT 113.2 08/19/2011 0855        Physical Exam:    VS:  BP (!) 110/58   Pulse 63   Ht 5\' 5"  (1.651 m)   Wt 170 lb (77.1 kg)   SpO2 96%   BMI 28.29 kg/m     Wt Readings from Last 3  Encounters:  03/05/23 170 lb (77.1 kg)  03/03/23 169 lb (76.7 kg)  01/11/23 169 lb (76.7 kg)    GEN:  Well nourished, well developed in no acute distress HEENT: Normal NECK: No JVD CARDIAC: RRR, no rubs, gallops; Mild AI and AS RESPIRATORY:  Clear to auscultation without rales, wheezing or rhonchi  ABDOMEN: Soft, non-tender, non-distended MUSCULOSKELETAL:  No edema; No deformity  SKIN: Warm and dry NEUROLOGIC:  Alert and oriented x 3 PSYCHIATRIC:  Normal affect   ASSESSMENT:    1. Nonrheumatic aortic valve insufficiency   2. Unstable angina pectoris (HCC)   3. Coronary artery disease involving native heart with unstable angina pectoris, unspecified vessel or lesion type [facility])    PLAN:    Coronary artery disease involving native coronary artery of native heart with angina pectoris (HCC) - with prior PCI - worsening symptoms, complicated by his CKD, and plan for IV contrast with VVS - we will attempt medical management (DAPT, and restart of long standing nitrates) - his MCD isn't terrible - he had fatigue on coreg; low hear rates may preclude beta blockage - will get PET MPI - if unable to medically manage may need cath if ok with nephrology   PAD (peripheral artery disease) (HCC)  - has planned angiogram   Essential hypertension  - The patient's blood pressure is controlled on his current regimen.  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  Concerns regarding medicines are outlined above.  Orders Placed This Encounter  Procedures   NM  PET CT CARDIAC PERFUSION MULTI W/ABSOLUTE BLOODFLOW   Lipid panel   ALT   EKG 12-Lead   ECHOCARDIOGRAM COMPLETE   Meds ordered this encounter  Medications   nitroGLYCERIN (NITROSTAT) 0.4 MG SL tablet    Sig: Place 1 tablet under tongue every 5 mins. DO NOT USE MORE THAN 3 TABS    Dispense:  25 tablet    Refill:  2   isosorbide mononitrate (IMDUR) 30 MG 24 hr tablet    Sig: Take 1 tablet (30 mg total) by mouth daily.    Dispense:  90 tablet    Refill:  3   rosuvastatin (CRESTOR) 20 MG tablet    Sig: Take 1 tablet (20 mg total) by mouth daily.    Dispense:  90 tablet    Refill:  3    Patient Instructions  Medication Instructions:  Your physician has recommended you make the following change in your medication:  START: isosorbide mononitrate (Imdur) 30 mg by mouth once daily  START: rosuvastatin (Crestor) 20 mg by mouth once daily  REFILLED: Nitroglycerin   REMOVED: hydralazine and amlodipine from your medication list *If you need a refill on  your cardiac medications before your next appointment, please call your pharmacy*   Lab Work: IN 3 MONTHS: FLP, ALT (nothing to eat or drink 12 hours prior except water and black coffee)  If you have labs (blood work) drawn today and your tests are completely normal, you will receive your results only by: MyChart Message (if you have MyChart) OR A paper copy in the mail If you have any lab test that is abnormal or we need to change your treatment, we will call you to review the results.   Testing/Procedures: Your physician has requested that you have an echocardiogram. Echocardiography is a painless test that uses sound waves to create images of your heart. It provides your doctor with information about the size and shape of your heart and how well your heart's chambers and valves are working. This procedure takes approximately one hour. There are no restrictions for this procedure. Please do NOT wear cologne, perfume, aftershave, or  lotions (deodorant is allowed). Please arrive 15 minutes prior to your appointment time.   Your physician has recommended that you have a Cardiac PET.   Follow-Up: At [facility], you and your health needs are our priority.  As part of our continuing mission to provide you with exceptional heart care, we have created designated Provider Care Teams.  These Care Teams include your primary Cardiologist (physician) and Advanced Practice Providers (APPs -  Physician Assistants and Nurse Practitioners) who all work together to provide you with the care you need, when you need it.    Your next appointment:   3 month(s)  Provider:   Jari Favre, PA-C, Jacolyn Reedy, PA-C, or Tereso Newcomer, PA-C        OTHER INSTRUCTIONS  How to Prepare for Your Cardiac PET/CT Stress Test:  1. Please do not take these medications before your test:   Medications that may interfere with the cardiac pharmacological stress agent (ex. nitrates - including erectile dysfunction medications, isosorbide mononitrate, tamulosin or beta-blockers) the day of the exam. (Erectile dysfunction medication should be held for at least 72 hrs prior to test) Theophylline containing medications for 12 hours. Dipyridamole 48 hours prior to the test. Your remaining medications may be taken with water.  2. Nothing to eat or drink, except water, 3 hours prior to arrival time.   NO caffeine/decaffeinated products, or chocolate 12 hours prior to arrival.  3. NO perfume, cologne or lotion  4. Total time is 1 to 2 hours; you may want to bring reading material for the waiting time.  5. Please report to Radiology at the [facility] Main Entrance 30 minutes early for your test.  [address] [place], [place] [address]  Diabetic Preparation:  Hold oral medications. You may take NPH and Lantus insulin. Do not take Humalog or Humulin R (Regular Insulin) the day of your test. Check blood sugars prior to leaving  the house. If able to eat breakfast prior to 3 hour fasting, you may take all medications, including your insulin, Do not worry if you miss your breakfast dose of insulin - start at your next meal.   In preparation for your appointment, medication and supplies will be purchased.  Appointment availability is limited, so if you need to cancel or reschedule, please call the Radiology Department at [phone]  24 hours in advance to avoid a cancellation fee of $100.00  What to Expect After you Arrive:  Once you arrive and check in for your appointment, you will be  taken to a preparation room within the Radiology Department.  A technologist or Nurse will obtain your medical history, verify that you are correctly prepped for the exam, and explain the procedure.  Afterwards,  an IV will be started in your arm and electrodes will be placed on your skin for EKG monitoring during the stress portion of the exam. Then you will be escorted to the PET/CT scanner.  There, staff will get you positioned on the scanner and obtain a blood pressure and EKG.  During the exam, you will continue to be connected to the EKG and blood pressure machines.  A small, safe amount of a radioactive tracer will be injected in your IV to obtain a series of pictures of your heart along with an injection of a stress agent.    After your Exam:  It is recommended that you eat a meal and drink a caffeinated beverage to counter act any effects of the stress agent.  Drink plenty of fluids for the remainder of the day and urinate frequently for the first couple of hours after the exam.  Your doctor will inform you of your test results within 7-10 business days.  For questions about your test or how to prepare for your test, please call: Rockwell Alexandria, Cardiac Imaging Nurse Navigator  Larey Brick, Cardiac Imaging Nurse Navigator Office: [phone]      Signed, Christell Constant, MD  03/05/2023 3:59 PM    [place]  [facility]

## 2023-03-05 NOTE — Progress Notes (Signed)
Office Note   History of Present Illness   Harold Waters is a 77 y.o. (1945/11/21) male who presents for surveillance of PAD.  He has a history of left common femoral to above-knee popliteal artery bypass with vein by Dr. Randie Heinz on 12/06/2018.  He has had intermittent edema of his left lower extremity since surgery and has been previously measured for 15 to 20 mmHg compression stockings.   At his last visit with our office he was having some pain in his left leg extending from his hip to his knee.  This was painful when bending over, when walking, or when going from sitting to standing.  At her follow-up today he is doing well.  He denies any claudication, rest pain, nonhealing wounds of the lower extremities.  His left lower extremity edema is currently well-controlled with compression stockings.  He is still having pain in his left leg extending from his hip to his knee.  This pain feels like a burning pain and bothers him when going from a sitting to standing position or when walking.  For the past 2 to 3 months he has also been experiencing the same pain in his right leg and lower back pain.  His lower back pain hurts him the most when bending over and gardening.  He denies any numbness.  Current Outpatient Medications  Medication Sig Dispense Refill   amLODipine (NORVASC) 10 MG tablet Take 1 tablet (10 mg total) by mouth daily. Annual appt due in Jan must see provider for future refills 90 tablet 1   Blood Glucose Monitoring Suppl (ONETOUCH VERIO) w/Device KIT 1 Units by Does not apply route daily as needed. 1 kit 1   Cholecalciferol (VITAMIN D3) 50 MCG (2000 UT) capsule Take 1 capsule (2,000 Units total) by mouth daily. 100 capsule 3   glucose blood (ONETOUCH VERIO) test strip Use to check blood sugar daily 50 each 11   hydrALAZINE (APRESOLINE) 25 MG tablet Take 1 tablet (25 mg total) by mouth 3 (three) times daily. 270 tablet 1   memantine (NAMENDA) 10 MG tablet Take 1 tablet (10 mg  total) by mouth at bedtime. 90 tablet 3   Multiple Vitamins-Minerals (MENS MULTIVITAMIN PLUS) TABS Take 1 tablet by mouth at bedtime.     nitroGLYCERIN (NITROSTAT) 0.4 MG SL tablet Place 1 tablet under tongue every 5 mins. DO NOT USE MORE THAN 3 TABS 25 tablet 5   olmesartan (BENICAR) 5 MG tablet Take 5 mg by mouth daily.     polyethylene glycol powder (GLYCOLAX/MIRALAX) 17 GM/SCOOP powder Take 17 g by mouth 2 (two) times daily as needed for moderate constipation. 500 g 3   rosuvastatin (CRESTOR) 40 MG tablet TAKE 1 TABLET BY MOUTH EVERY DAY 7 tablet 1   tamsulosin (FLOMAX) 0.4 MG CAPS capsule Take 1 capsule (0.4 mg total) by mouth daily after supper. 30 capsule 5   traZODone (DESYREL) 50 MG tablet TAKE 1/4 TO 1/2 TABLET BY MOUTH AT NIGHT AS NEEDED FOR INSOMNIA 45 tablet 3   aspirin EC (ASPIRIN LOW DOSE) 81 MG tablet Take 1 tablet (81 mg total) by mouth daily. Annual appt due in July must see provider for future refills 100 tablet 0   No current facility-administered medications for this visit.    REVIEW OF SYSTEMS (negative unless checked):   Cardiac:  []  Chest pain or chest pressure? []  Shortness of breath upon activity? []  Shortness of breath when lying flat? []  Irregular heart rhythm?  Vascular:  [  x] Pain in calf, thigh, or hip brought on by walking? []  Pain in feet at night that wakes you up from your sleep? []  Blood clot in your veins? [x]  Leg swelling?  Pulmonary:  []  Oxygen at home? []  Productive cough? []  Wheezing?  Neurologic:  []  Sudden weakness in arms or legs? []  Sudden numbness in arms or legs? []  Sudden onset of difficult speaking or slurred speech? []  Temporary loss of vision in one eye? []  Problems with dizziness?  Gastrointestinal:  []  Blood in stool? []  Vomited blood?  Genitourinary:  []  Burning when urinating? []  Blood in urine?  Psychiatric:  []  Major depression  Hematologic:  []  Bleeding problems? []  Problems with blood  clotting?  Dermatologic:  []  Rashes or ulcers?  Constitutional:  []  Fever or chills?  Ear/Nose/Throat:  []  Change in hearing? []  Nose bleeds? []  Sore throat?  Musculoskeletal:  [x]  Back pain? [x]  Joint pain? []  Muscle pain?   Physical Examination   Vitals:   03/03/23 0924  BP: 133/66  Pulse: 63  Resp: 20  Temp: 97.9 F (36.6 C)  TempSrc: Temporal  SpO2: 95%  Weight: 169 lb (76.7 kg)  Height: 5\' 5"  (1.651 m)   Body mass index is 28.12 kg/m.  General:  WDWN in NAD; vital signs documented above Gait: Not observed HENT: WNL, normocephalic Pulmonary: normal non-labored breathing  Cardiac: regular Abdomen: soft, NT, no masses Skin: without rashes Vascular Exam/Pulses: 2+ DP pulses bilaterally. Unable to palpate popliteal pulses. 2+ femoral pulses bilaterally Extremities: without ischemic changes, without gangrene , without cellulitis; without open wounds;  Musculoskeletal: no muscle wasting or atrophy  Neurologic: A&O X 3;  No focal weakness or paresthesias are detected Psychiatric:  The pt has Normal affect.  Non-Invasive Vascular Imaging ABI (02/17/2023) R:  ABI: 1.06 (1.06),  PT: tri DP: bi TBI:  105 L:  ABI: 0.98 (1.01),  PT: tri DP: bi TBI: 0.7  LLE Bypass Duplex (02/17/2023) Left Graft #1: CFA- AK Popliteal  +--------------------+--------+---------------+--------+---------------+                     PSV cm/sStenosis       WaveformComments         +--------------------+--------+---------------+--------+---------------+  Inflow             117                    biphasic                 +--------------------+--------+---------------+--------+---------------+  Proximal Anastomosis116                    biphasic                 +--------------------+--------+---------------+--------+---------------+  Proximal Graft      91      50-70% stenosisbiphasicplaque observed  Proximal-mid graft  201                                              +--------------------+--------+---------------+--------+---------------+  Mid Graft           27                     biphasic                 +--------------------+--------+---------------+--------+---------------+  Distal Graft  25                     biphasic                 +--------------------+--------+---------------+--------+---------------+  Distal Anastomosis  46                     biphasic                 +--------------------+--------+---------------+--------+---------------+  Outflow            40                     biphasic                 +--------------------+--------+---------------+--------+---------------+   Medical Decision Making   [facility] is a 77 y.o. male who presents for surveillance of PAD  Based on the patient's vascular studies, his ABIs are essentially unchanged bilaterally.  His right ABI is 1.06 and left ABI 0.98. Duplex of his left common femoral to above-knee popliteal artery bypass demonstrates a patent bypass with decreased velocities below 40cm/s in the mid and distal portions of the graft.  This is directly after a 50 to 70% stenosis noted in the proximal graft with elevated velocity of 201cm/s He denies any rest pain, claudication, nonhealing wounds of lower extremities.  He has palpable DP pulses bilaterally.  He also has palpable femoral pulses I explained to the patient that he will likely need left lower extremity angiogram with treatment of his bypass graft.  Proximal graft stenosis with reduced velocities below 40cm/s threatens viability of the graft.  He is agreeable to this We will get the patient scheduled for abdominal aortogram with left lower extremity runoff and possible treatment of left common femoral to above-knee popliteal artery bypass graft in the next 2 weeks with Dr. Randie Heinz. He can continue his aspirin and statin. The patient has also been dealing with worsening pain in his legs  radiating from his hips down to his knees, exacerbated with bending over and movements from sitting to standing.  Given that the patient has palpable femoral pulses I do not think his pain is vascular in nature.  He also reports lower back pain.  Potentially he is dealing with some sciatica or lumbar spine issues.  I will refer him to an orthopedic doctor for evaluation   Loel Dubonnet PA-C [facility] Office: [phone]  Clinic MD: Randie Heinz

## 2023-03-05 NOTE — Patient Instructions (Addendum)
Medication Instructions:  Your physician has recommended you make the following change in your medication:  START: isosorbide mononitrate (Imdur) 30 mg by mouth once daily  START: rosuvastatin (Crestor) 20 mg by mouth once daily  REFILLED: Nitroglycerin   REMOVED: hydralazine and amlodipine from your medication list *If you need a refill on your cardiac medications before your next appointment, please call your pharmacy*   Lab Work: IN 3 MONTHS: FLP, ALT (nothing to eat or drink 12 hours prior except water and black coffee)  If you have labs (blood work) drawn today and your tests are completely normal, you will receive your results only by: MyChart Message (if you have MyChart) OR A paper copy in the mail If you have any lab test that is abnormal or we need to change your treatment, we will call you to review the results.   Testing/Procedures: Your physician has requested that you have an echocardiogram. Echocardiography is a painless test that uses sound waves to create images of your heart. It provides your doctor with information about the size and shape of your heart and how well your heart's chambers and valves are working. This procedure takes approximately one hour. There are no restrictions for this procedure. Please do NOT wear cologne, perfume, aftershave, or lotions (deodorant is allowed). Please arrive 15 minutes prior to your appointment time.   Your physician has recommended that you have a Cardiac PET.   Follow-Up: At [facility], you and your health needs are our priority.  As part of our continuing mission to provide you with exceptional heart care, we have created designated Provider Care Teams.  These Care Teams include your primary Cardiologist (physician) and Advanced Practice Providers (APPs -  Physician Assistants and Nurse Practitioners) who all work together to provide you with the care you need, when you need it.    Your next appointment:   3  month(s)  Provider:   Jari Favre, PA-C, Jacolyn Reedy, PA-C, or Tereso Newcomer, PA-C        OTHER INSTRUCTIONS  How to Prepare for Your Cardiac PET/CT Stress Test:  1. Please do not take these medications before your test:   Medications that may interfere with the cardiac pharmacological stress agent (ex. nitrates - including erectile dysfunction medications, isosorbide mononitrate, tamulosin or beta-blockers) the day of the exam. (Erectile dysfunction medication should be held for at least 72 hrs prior to test) Theophylline containing medications for 12 hours. Dipyridamole 48 hours prior to the test. Your remaining medications may be taken with water.  2. Nothing to eat or drink, except water, 3 hours prior to arrival time.   NO caffeine/decaffeinated products, or chocolate 12 hours prior to arrival.  3. NO perfume, cologne or lotion  4. Total time is 1 to 2 hours; you may want to bring reading material for the waiting time.  5. Please report to Radiology at the [facility] Main Entrance 30 minutes early for your test.  [address] [place], [place] [address]  Diabetic Preparation:  Hold oral medications. You may take NPH and Lantus insulin. Do not take Humalog or Humulin R (Regular Insulin) the day of your test. Check blood sugars prior to leaving the house. If able to eat breakfast prior to 3 hour fasting, you may take all medications, including your insulin, Do not worry if you miss your breakfast dose of insulin - start at your next meal.   In preparation for your appointment, medication and supplies will  be purchased.  Appointment availability is limited, so if you need to cancel or reschedule, please call the Radiology Department at [phone]  24 hours in advance to avoid a cancellation fee of $100.00  What to Expect After you Arrive:  Once you arrive and check in for your appointment, you will be taken to a preparation room within the Radiology  Department.  A technologist or Nurse will obtain your medical history, verify that you are correctly prepped for the exam, and explain the procedure.  Afterwards,  an IV will be started in your arm and electrodes will be placed on your skin for EKG monitoring during the stress portion of the exam. Then you will be escorted to the PET/CT scanner.  There, staff will get you positioned on the scanner and obtain a blood pressure and EKG.  During the exam, you will continue to be connected to the EKG and blood pressure machines.  A small, safe amount of a radioactive tracer will be injected in your IV to obtain a series of pictures of your heart along with an injection of a stress agent.    After your Exam:  It is recommended that you eat a meal and drink a caffeinated beverage to counter act any effects of the stress agent.  Drink plenty of fluids for the remainder of the day and urinate frequently for the first couple of hours after the exam.  Your doctor will inform you of your test results within 7-10 business days.  For questions about your test or how to prepare for your test, please call: Rockwell Alexandria, Cardiac Imaging Nurse Navigator  Larey Brick, Cardiac Imaging Nurse Navigator Office: [phone]

## 2023-03-09 ENCOUNTER — Other Ambulatory Visit: Payer: Self-pay

## 2023-03-09 DIAGNOSIS — T82858A Stenosis of vascular prosthetic devices, implants and grafts, initial encounter: Secondary | ICD-10-CM

## 2023-03-10 ENCOUNTER — Ambulatory Visit (INDEPENDENT_AMBULATORY_CARE_PROVIDER_SITE_OTHER): Payer: PPO | Admitting: Orthopaedic Surgery

## 2023-03-10 ENCOUNTER — Ambulatory Visit (INDEPENDENT_AMBULATORY_CARE_PROVIDER_SITE_OTHER): Payer: PPO

## 2023-03-10 DIAGNOSIS — M25552 Pain in left hip: Secondary | ICD-10-CM | POA: Diagnosis not present

## 2023-03-10 NOTE — Progress Notes (Signed)
Chief Complaint: Left hip pain     History of Present Illness:    Harold Waters is a 77 y.o. male presents today with ongoing left hip pain that he experiences in the central portion of the buttocks.  He is currently scheduled to have an abdominal aortogram and several weeks.  He is experiencing pain and limited motion particularly when he is on his feet for longer period of time.  He is here today with his daughter.    Surgical History:   None  PMH/PSH/Family History/Social History/Meds/Allergies:    Past Medical History:  Diagnosis Date   Angina    Chronic stomach ulcer    "get them off and on" (09/19/2018)   Coronary atherosclerosis of unspecified type of vessel, native or graft    Diverticulosis    DVT of lower extremity (deep venous thrombosis) (HCC) ~ 2010   LLE   Erosive gastritis    Essential hypertension, benign    GERD (gastroesophageal reflux disease)    Headache(784.0) 02/18/12   "lately"   History of kidney stones    Internal hemorrhoids    Lower back pain    Myocardial infarction (HCC) 1997   Osteoarthritis    Other and unspecified hyperlipidemia    PAD (peripheral artery disease) (HCC)    with ABI's 0.8 on the right and 0.86 on the left   Pneumonia 1957   PSVT (paroxysmal supraventricular tachycardia) 02/18/12   Shortness of breath    "lying down"   Sleep apnea    does not wear c-pap; pt does not recall this hx on 09/19/2018   Past Surgical History:  Procedure Laterality Date   ARTHROSCOPY KNEE W/ DRILLING Right ~ 2001   BYPASS GRAFT Left 12/05/2018   femoral popliteal    CARDIAC CATHETERIZATION  09/19/2018   CATARACT EXTRACTION     COLONOSCOPY     CORONARY ANGIOPLASTY WITH STENT PLACEMENT  1997   "2"   CORONARY PRESSURE/FFR STUDY  09/19/2018   CORONARY PRESSURE/FFR STUDY N/A 09/19/2018   Procedure: INTRAVASCULAR PRESSURE WIRE/FFR STUDY;  Surgeon: Runell Gess, MD;  Location: [facility] INVASIVE CV LAB;   Service: Cardiovascular;  Laterality: N/A;   FEMORAL-POPLITEAL BYPASS GRAFT Left 12/06/2018   Procedure: BYPASS GRAFT FEMORAL-POPLITEAL ARTERY;  Surgeon: Maeola Harman, MD;  Location: [facility] OR;  Service: Vascular;  Laterality: Left;   ILIAC ARTERY STENT  04/22/2017   . Placement of a 6 mm x 100 mm Viabahn covered stent left SFA   LEFT HEART CATH AND CORONARY ANGIOGRAPHY N/A 09/19/2018   Procedure: LEFT HEART CATH AND CORONARY ANGIOGRAPHY;  Surgeon: Runell Gess, MD;  Location: [facility] IN[facility]AS[facility] CV [facility];  Service: Cardiovascular;  Laterality: N/A;   LEFT HEART CATHETERIZATION WITH CORONARY ANGIOGRAM N/A 02/19/2012   Procedure: LEFT HEART CATHETERIZATION WITH CORONARY ANGIOGRAM;  Surgeon: Laurey Morale, MD;  Location: [facility] CATH LAB;  Service: Cardiovascular;  Laterality: N/A;   LOWER EXTREMITY ANGIOGRAPHY Bilateral 04/22/2017   Procedure: Lower Extremity Angiography;  Surgeon: Runell Gess, MD;  Location: [facility] INVASIVE CV LAB;  Service: Cardiovascular;  Laterality: Bilateral;   LOWER EXTREMITY ANGIOGRAPHY Bilateral 09/19/2018   LOWER EXTREMITY ANGIOGRAPHY Bilateral 09/19/2018   Procedure: LOWER EXTREMITY ANGIOGRAPHY;  Surgeon: Runell Gess, MD;  Location: [facility]AS[facility] CV [facility];  Service: Cardiovascular;  Laterality: Bilateral;  PERIPHERAL ARTERIAL STENT GRAFT  09/2010   LLE   PERIPHERAL VASCULAR ATHERECTOMY Left 04/22/2017   Procedure: Peripheral Vascular Atherectomy;  Surgeon: Runell Gess, MD;  Location: [facility] INVASIVE CV LAB;  Service: Cardiovascular;  Laterality: Left;  SFA   PERIPHERAL VASCULAR INTERVENTION Left 04/22/2017   Procedure: Peripheral Vascular Intervention;  Surgeon: Runell Gess, MD;  Location: [facility] INVASIVE CV LAB;  Service: Cardiovascular;  Laterality: Left;  SFA   UPPER GASTROINTESTINAL ENDOSCOPY     Social History   Socioeconomic History   Marital status: Married    Spouse name: Not on file   Number of children: 2   Years of education: Not on file    Highest education level: Not on file  Occupational History   Occupation: retired  Tobacco Use   Smoking status: Former    Packs/day: 1.00    Years: 40.00    Additional pack years: 0.00    Total pack years: 40.00    Types: Cigarettes    Quit date: 04/03/2010    Years since quitting: 12.9    Passive exposure: Never   Smokeless tobacco: Never  Vaping Use   Vaping Use: Never used  Substance and Sexual Activity   Alcohol use: Not Currently    Comment: "used to drink when I was young; quit ~ 1980's"   Drug use: Never   Sexual activity: Yes  Other Topics Concern   Not on file  Social History Narrative   Right handed   Two story home   4 children   Wife lives with him in the home   Caffeine on occasion   Social Determinants of Health   Financial Resource Strain: Low Risk  (06/08/2022)   Overall Financial Resource Strain (CARDIA)    Difficulty of Paying Living Expenses: Not hard at all  Food Insecurity: No Food Insecurity (06/08/2022)   Hunger Vital Sign    Worried About Running Out of Food in the Last Year: Never true    Ran Out of Food in the Last Year: Never true  Transportation Needs: No Transportation Needs (06/08/2022)   PRAPARE - Administrator, Civil Service (Medical): No    Lack of Transportation (Non-Medical): No  Physical Activity: Insufficiently Active (06/08/2022)   Exercise Vital Sign    Days of Exercise per Week: 3 days    Minutes of Exercise per Session: 30 min  Stress: No Stress Concern Present (06/08/2022)   [employer] of Occupational Health - Occupational Stress Questionnaire    Feeling of Stress : Not at all  Social Connections: Moderately Integrated (06/08/2022)   Social Connection and Isolation Panel [NHANES]    Frequency of Communication with Friends and Family: Three times a week    Frequency of Social Gatherings with Friends and Family: Three times a week    Attends Religious Services: More than 4 times per year    Active Member of Clubs or  Organizations: No    Attends Banker Meetings: Never    Marital Status: Married   Family History  Problem Relation Age of Onset   Ulcers Father        had stomach issues, not sure what happened   Coronary artery disease Brother        male 1st degree relative <50   Colon cancer Neg Hx    Esophageal cancer Neg Hx    Rectal cancer Neg Hx    Stomach cancer Neg Hx    Allergies  Allergen Reactions  Egg-Derived Products Itching   Aricept [Donepezil] Nausea And Vomiting    n/v   Coreg [Carvedilol] Other (See Comments)    fatigue   Current Outpatient Medications  Medication Sig Dispense Refill   aspirin EC (ASPIRIN LOW DOSE) 81 MG tablet Take 1 tablet (81 mg total) by mouth daily. Annual appt due in July must see provider for future refills 100 tablet 0   Blood Glucose Monitoring Suppl (ONETOUCH VERIO) w/Device KIT 1 Units by Does not apply route daily as needed. 1 kit 1   Cholecalciferol (VITAMIN D3) 50 MCG (2000 UT) capsule Take 1 capsule (2,000 Units total) by mouth daily. 100 capsule 3   glucose blood (ONETOUCH VERIO) test strip Use to check blood sugar daily 50 each 11   isosorbide mononitrate (IMDUR) 30 MG 24 hr tablet Take 1 tablet (30 mg total) by mouth daily. 90 tablet 3   memantine (NAMENDA) 10 MG tablet Take 1 tablet (10 mg total) by mouth at bedtime. 90 tablet 3   Multiple Vitamins-Minerals (MENS MULTIVITAMIN PLUS) TABS Take 1 tablet by mouth at bedtime.     nitroGLYCERIN (NITROSTAT) 0.4 MG SL tablet Place 1 tablet under tongue every 5 mins. DO NOT USE MORE THAN 3 TABS 25 tablet 2   olmesartan (BENICAR) 5 MG tablet Take 5 mg by mouth daily.     polyethylene glycol powder (GLYCOLAX/MIRALAX) 17 GM/SCOOP powder Take 17 g by mouth 2 (two) times daily as needed for moderate constipation. 500 g 3   rosuvastatin (CRESTOR) 20 MG tablet Take 1 tablet (20 mg total) by mouth daily. 90 tablet 3   tamsulosin (FLOMAX) 0.4 MG CAPS capsule Take 1 capsule (0.4 mg total) by mouth  daily after supper. (Patient taking differently: Take 0.4 mg by mouth as needed.) 30 capsule 5   traZODone (DESYREL) 50 MG tablet TAKE 1/4 TO 1/2 TABLET BY MOUTH AT NIGHT AS NEEDED FOR INSOMNIA 45 tablet 3   No current facility-administered medications for this visit.   No results found.  Review of Systems:   A ROS was performed including pertinent positives and negatives as documented in the HPI.  Physical Exam :   Constitutional: NAD and appears stated age Neurological: Alert and oriented Psych: Appropriate affect and cooperative There were no vitals taken for this visit.   Comprehensive Musculoskeletal Exam:    Left hip with internal rotation of 30 degrees external rotation 40 degrees.  No significant pain with FADIR maneuver.  No crepitus appreciated  Imaging:   Xray (3 views left hip): Cam impingement with mild osteoarthritis of the left hip   I personally reviewed and interpreted the radiographs.   Assessment:   77 y.o. male with mild osteoarthritis of the left hip.  I discussed that we could perform an injection in the left hip although I would defer this at today's visit given the proximity to surgery.  In the meantime he will use Voltaren gel applied directly to the hip.  I will plan to see him back following his vascular surgery and at that time we can consider possible hip injection. Plan :    -Return to clinic for left hip injection following surgery     I personally saw and evaluated the patient, and participated in the management and treatment plan.  Huel Cote, MD Attending Physician, Orthopedic Surgery  This document was dictated using Dragon voice recognition software. A reasonable attempt at proof reading has been made to minimize errors.

## 2023-03-16 ENCOUNTER — Telehealth: Payer: Self-pay | Admitting: Internal Medicine

## 2023-03-16 ENCOUNTER — Telehealth: Payer: Self-pay

## 2023-03-16 NOTE — Telephone Encounter (Signed)
Called patient to schedule Medicare Annual Wellness Visit (AWV). Left message for patient to call back and schedule Medicare Annual Wellness Visit (AWV).  Last date of AWV: 06/08/22 (can schedule anytime, HTA is calendar year)  Please schedule an appointment at any time On Annual Wellness Visit Schedule.   Agnes Lawrence, CMA (AAMA)  [facility]- AWV Program [phone]

## 2023-03-16 NOTE — Telephone Encounter (Signed)
Called patient to schedule Medicare Annual Wellness Visit (AWV). Left message for patient to call back and schedule Medicare Annual Wellness Visit (AWV).  Last date of AWV: 06/08/2022  Please schedule an appointment at any time with [facility].  If any questions, please contact me at [phone].  Thank you ,  Randon Goldsmith Care Guide [facility] AWV TEAM Direct Dial: [phone]

## 2023-03-17 ENCOUNTER — Ambulatory Visit (HOSPITAL_COMMUNITY): Payer: PPO | Attending: Cardiology

## 2023-03-17 DIAGNOSIS — I351 Nonrheumatic aortic (valve) insufficiency: Secondary | ICD-10-CM | POA: Diagnosis not present

## 2023-03-17 DIAGNOSIS — I25110 Atherosclerotic heart disease of native coronary artery with unstable angina pectoris: Secondary | ICD-10-CM | POA: Diagnosis not present

## 2023-03-17 LAB — ECHOCARDIOGRAM COMPLETE
AR max vel: 1.75 cm2
AV Area VTI: 1.87 cm2
AV Area mean vel: 1.74 cm2
AV Mean grad: 10 mmHg
AV Peak grad: 18.3 mmHg
Ao pk vel: 2.14 m/s
Area-P 1/2: 2.74 cm2
MV M vel: 4.57 m/s
MV Peak grad: 83.4 mmHg
P 1/2 time: 389 msec
S' Lateral: 3 cm

## 2023-03-22 ENCOUNTER — Other Ambulatory Visit: Payer: Self-pay

## 2023-03-22 ENCOUNTER — Encounter (HOSPITAL_COMMUNITY)
Admission: RE | Disposition: A | Discharge status: Home / Self Care | Payer: Self-pay | Source: Home / Self Care | Attending: Vascular Surgery

## 2023-03-22 ENCOUNTER — Ambulatory Visit (HOSPITAL_COMMUNITY)
Admission: RE | Admit: 2023-03-22 | Discharge: 2023-03-22 | Disposition: A | Discharge status: Home / Self Care | Payer: PPO | Attending: Vascular Surgery | Admitting: Vascular Surgery

## 2023-03-22 DIAGNOSIS — Z7982 Long term (current) use of aspirin: Secondary | ICD-10-CM | POA: Diagnosis not present

## 2023-03-22 DIAGNOSIS — Y832 Surgical operation with anastomosis, bypass or graft as the cause of abnormal reaction of the patient, or of later complication, without mention of misadventure at the time of the procedure: Secondary | ICD-10-CM | POA: Diagnosis not present

## 2023-03-22 DIAGNOSIS — I70312 Atherosclerosis of unspecified type of bypass graft(s) of the extremities with intermittent claudication, left leg: Secondary | ICD-10-CM | POA: Insufficient documentation

## 2023-03-22 DIAGNOSIS — T82898A Other specified complication of vascular prosthetic devices, implants and grafts, initial encounter: Secondary | ICD-10-CM

## 2023-03-22 DIAGNOSIS — Z79899 Other long term (current) drug therapy: Secondary | ICD-10-CM | POA: Insufficient documentation

## 2023-03-22 DIAGNOSIS — T82858A Stenosis of vascular prosthetic devices, implants and grafts, initial encounter: Secondary | ICD-10-CM

## 2023-03-22 HISTORY — PX: PERIPHERAL VASCULAR ATHERECTOMY: CATH118256

## 2023-03-22 HISTORY — PX: PERIPHERAL VASCULAR INTERVENTION: CATH118257

## 2023-03-22 HISTORY — PX: ABDOMINAL AORTOGRAM W/LOWER EXTREMITY: CATH118223

## 2023-03-22 LAB — POCT I-STAT, CHEM 8
BUN: 28 mg/dL — ABNORMAL HIGH (ref 8–23)
Calcium, Ion: 1.26 mmol/L (ref 1.15–1.40)
Chloride: 103 mmol/L (ref 98–111)
Creatinine, Ser: 2.2 mg/dL — ABNORMAL HIGH (ref 0.61–1.24)
Glucose, Bld: 166 mg/dL — ABNORMAL HIGH (ref 70–99)
HCT: 33 % — ABNORMAL LOW (ref 39.0–52.0)
Hemoglobin: 11.2 g/dL — ABNORMAL LOW (ref 13.0–17.0)
Potassium: 4 mmol/L (ref 3.5–5.1)
Sodium: 139 mmol/L (ref 135–145)
TCO2: 23 mmol/L (ref 22–32)

## 2023-03-22 LAB — GLUCOSE, CAPILLARY: Glucose-Capillary: 139 mg/dL — ABNORMAL HIGH (ref 70–99)

## 2023-03-22 SURGERY — ABDOMINAL AORTOGRAM W/LOWER EXTREMITY
Anesthesia: LOCAL

## 2023-03-22 MED ORDER — ONDANSETRON HCL 4 MG/2ML IJ SOLN: 4.0000 mg | Freq: Four times a day (QID) | INTRAMUSCULAR | Status: DC | PRN

## 2023-03-22 MED ORDER — HEPARIN SODIUM (PORCINE) 1000 UNIT/ML IJ SOLN
INTRAMUSCULAR | Status: DC | PRN
  Administered 2023-03-22: 5000 [IU] via INTRAVENOUS

## 2023-03-22 MED ORDER — CLOPIDOGREL BISULFATE 75 MG PO TABS: 75.0000 mg | ORAL_TABLET | Freq: Every day | ORAL | Status: DC

## 2023-03-22 MED ORDER — LIDOCAINE HCL (PF) 1 % IJ SOLN
INTRAMUSCULAR | Status: DC | PRN
  Administered 2023-03-22: 15 mL via INTRADERMAL

## 2023-03-22 MED ORDER — SODIUM CHLORIDE 0.9 % IV SOLN: 250.0000 mL | INTRAVENOUS | Status: DC | PRN

## 2023-03-22 MED ORDER — CLOPIDOGREL BISULFATE 75 MG PO TABS: 75.0000 mg | ORAL_TABLET | Freq: Every day | ORAL | 11 refills | Status: DC

## 2023-03-22 MED ORDER — CLOPIDOGREL BISULFATE 75 MG PO TABS: 300.0000 mg | ORAL_TABLET | Freq: Once | ORAL | Status: DC

## 2023-03-22 MED ORDER — LIDOCAINE HCL (PF) 1 % IJ SOLN
INTRAMUSCULAR | Status: AC
  Filled 2023-03-22: qty 30

## 2023-03-22 MED ORDER — SODIUM CHLORIDE 0.9 % WEIGHT BASED INFUSION: 1.0000 mL/kg/h | INTRAVENOUS | Status: DC

## 2023-03-22 MED ORDER — MORPHINE SULFATE (PF) 2 MG/ML IV SOLN: 2.0000 mg | INTRAVENOUS | Status: DC | PRN

## 2023-03-22 MED ORDER — HEPARIN SODIUM (PORCINE) 1000 UNIT/ML IJ SOLN
INTRAMUSCULAR | Status: AC
  Filled 2023-03-22: qty 10

## 2023-03-22 MED ORDER — FENTANYL CITRATE (PF) 100 MCG/2ML IJ SOLN
INTRAMUSCULAR | Status: DC | PRN
  Administered 2023-03-22: 50 ug via INTRAVENOUS

## 2023-03-22 MED ORDER — CLOPIDOGREL BISULFATE 300 MG PO TABS
ORAL_TABLET | ORAL | Status: DC | PRN
  Administered 2023-03-22: 300 mg via ORAL

## 2023-03-22 MED ORDER — SODIUM CHLORIDE 0.9% FLUSH: 3.0000 mL | Freq: Two times a day (BID) | INTRAVENOUS | Status: DC

## 2023-03-22 MED ORDER — IODIXANOL 320 MG/ML IV SOLN
INTRAVENOUS | Status: DC | PRN
  Administered 2023-03-22: 35 mL via INTRA_ARTERIAL

## 2023-03-22 MED ORDER — CLOPIDOGREL BISULFATE 300 MG PO TABS
ORAL_TABLET | ORAL | Status: AC
  Filled 2023-03-22: qty 1

## 2023-03-22 MED ORDER — ACETAMINOPHEN 325 MG PO TABS: 650.0000 mg | ORAL_TABLET | ORAL | Status: DC | PRN

## 2023-03-22 MED ORDER — HEPARIN (PORCINE) IN NACL 1000-0.9 UT/500ML-% IV SOLN
INTRAVENOUS | Status: DC | PRN
  Administered 2023-03-22 (×2): 500 mL

## 2023-03-22 MED ORDER — MIDAZOLAM HCL 2 MG/2ML IJ SOLN
INTRAMUSCULAR | Status: AC
  Filled 2023-03-22: qty 2

## 2023-03-22 MED ORDER — MIDAZOLAM HCL 2 MG/2ML IJ SOLN
INTRAMUSCULAR | Status: DC | PRN
  Administered 2023-03-22: 1 mg via INTRAVENOUS

## 2023-03-22 MED ORDER — SODIUM CHLORIDE 0.9 % IV SOLN: INTRAVENOUS | Status: DC

## 2023-03-22 MED ORDER — SODIUM CHLORIDE 0.9% FLUSH: 3.0000 mL | INTRAVENOUS | Status: DC | PRN

## 2023-03-22 MED ORDER — FENTANYL CITRATE (PF) 100 MCG/2ML IJ SOLN
INTRAMUSCULAR | Status: AC
  Filled 2023-03-22: qty 2

## 2023-03-22 MED ORDER — HYDRALAZINE HCL 20 MG/ML IJ SOLN: 5.0000 mg | INTRAMUSCULAR | Status: DC | PRN

## 2023-03-22 MED ORDER — OXYCODONE HCL 5 MG PO TABS: 5.0000 mg | ORAL_TABLET | ORAL | Status: DC | PRN

## 2023-03-22 SURGICAL SUPPLY — 23 items
BALLN STERLING OTW 6X150X150 (BALLOONS) ×2
BALLOON STERLING OTW 6X150X150 (BALLOONS) IMPLANT
CATH AURYON ATHERECTOMY 2.35 (CATHETERS) IMPLANT
CATH CROSS OVER TEMPO 5F (CATHETERS) IMPLANT
CATH OMNI FLUSH 5F 65CM (CATHETERS) IMPLANT
GUIDEWIRE ANGLED .035X150CM (WIRE) IMPLANT
KIT ANGIASSIST CO2 SYSTEM (KITS) IMPLANT
KIT ENCORE 26 ADVANTAGE (KITS) IMPLANT
KIT MICROPUNCTURE NIT STIFF (SHEATH) IMPLANT
KIT PV (KITS) ×3 IMPLANT
SHEATH CATAPULT 6FR 45 (SHEATH) IMPLANT
SHEATH CATAPULT 7FR 45 (SHEATH) IMPLANT
SHEATH PINNACLE 5F 10CM (SHEATH) IMPLANT
SHEATH PINNACLE 7F 10CM (SHEATH) IMPLANT
SHEATH PROBE COVER 6X72 (BAG) IMPLANT
STENT ELUVIA 6X150X130 (Permanent Stent) IMPLANT
STOPCOCK MORSE 400PSI 3WAY (MISCELLANEOUS) IMPLANT
SYR MEDRAD MARK 7 150ML (SYRINGE) ×3 IMPLANT
TRANSDUCER W/STOPCOCK (MISCELLANEOUS) ×3 IMPLANT
TRAY PV CATH (CUSTOM PROCEDURE TRAY) ×3 IMPLANT
TUBING CIL FLEX 10 FLL-RA (TUBING) IMPLANT
WIRE SPARTACORE .014X300CM (WIRE) IMPLANT
WIRE STARTER BENTSON 035X150 (WIRE) IMPLANT

## 2023-03-22 NOTE — Discharge Instructions (Signed)
Femoral Site Care This sheet gives you information about how to care for yourself after your procedure. Your health care provider may also give you more specific instructions. If you have problems or questions, contact your health care provider. What can I expect after the procedure?  After the procedure, it is common to have: Bruising that usually fades within 1-2 weeks. Tenderness at the site. Follow these instructions at home: Wound care Follow instructions from your health care provider about how to take care of your insertion site. Make sure you: Wash your hands with soap and water before you change your bandage (dressing). If soap and water are not available, use hand sanitizer. Remove your dressing as told by your health care provider. 24 hours Do not take baths, swim, or use a hot tub until your health care provider approves. You may shower 24-48 hours after the procedure or as told by your health care provider. Gently wash the site with plain soap and water. Pat the area dry with a clean towel. Do not rub the site. This may cause bleeding. Do not apply powder or lotion to the site. Keep the site clean and dry. Check your femoral site every day for signs of infection. Check for: Redness, swelling, or pain. Fluid or blood. Warmth. Pus or a bad smell. Activity For the first 2-3 days after your procedure, or as long as directed: Avoid climbing stairs as much as possible. Do not squat. Do not lift anything that is heavier than 10 lb (4.5 kg), or the limit that you are told, until your health care provider says that it is safe. For 5 days Rest as directed. Avoid sitting for a long time without moving. Get up to take short walks every 1-2 hours. Do not drive for 24 hours if you were given a medicine to help you relax (sedative). General instructions Take over-the-counter and prescription medicines only as told by your health care provider. Keep all follow-up visits as told by your  health care provider. This is important. Contact a health care provider if you have: A fever or chills. You have redness, swelling, or pain around your insertion site. Get help right away if: The catheter insertion area swells very fast. You pass out. You suddenly start to sweat or your skin gets clammy. The catheter insertion area is bleeding, and the bleeding does not stop when you hold steady pressure on the area. The area near or just beyond the catheter insertion site becomes pale, cool, tingly, or numb. These symptoms may represent a serious problem that is an emergency. Do not wait to see if the symptoms will go away. Get medical help right away. Call your local emergency services (911 in the [place] Do not drive yourself to the hospital. Summary After the procedure, it is common to have bruising that usually fades within 1-2 weeks. Check your femoral site every day for signs of infection. Do not lift anything that is heavier than 10 lb (4.5 kg), or the limit that you are told, until your health care provider says that it is safe. This information is not intended to replace advice given to you by your health care provider. Make sure you discuss any questions you have with your health care provider. Document Revised: 11/01/2017 Document Reviewed: 11/01/2017 Elsevier Patient Education  2020 Elsevier Inc.  

## 2023-03-22 NOTE — Interval H&P Note (Signed)
History and Physical Interval Note:  03/22/2023 8:10 AM  [facility]  has presented today for surgery, with the diagnosis of bypass graft stenosis.  The various methods of treatment have been discussed with the patient and family. After consideration of risks, benefits and other options for treatment, the patient has consented to  Procedure(s): ABDOMINAL AORTOGRAM W/LOWER EXTREMITY (N/A) as a surgical intervention.  The patient's history has been reviewed, patient examined, no change in status, stable for surgery.  I have reviewed the patient's chart and labs.  Questions were answered to the patient's satisfaction.     Lemar Livings

## 2023-03-22 NOTE — Op Note (Signed)
Patient name: Harold Waters MRN: [id number] DOB: 1946/03/14 Sex: male  03/22/2023 Pre-operative Diagnosis: Peripheral arterial disease with impending graft failure left femoral to popliteal artery bypass graft originally performed for life-limiting claudication Post-operative diagnosis:  Same Surgeon:  Luanna Salk. Randie Heinz, MD Procedure Performed: 1.  Ultrasound-guided cannulation and percutaneous closure with Mynx device right common femoral artery 2.  Aortogram with selective catheterization of left lower extremity bypass graft and left lower extremity angiography 3.  Laser athrectomy of the left femoral to above-knee popliteal bypass graft with 2.23mm Auryon 4.  Stent of left lower extremity femoropopliteal bypass using 6 x 150 mm Eluvia 5.  Sedation with fentanyl and Versed for 53 minutes   Indications: 77 year old male with a history of a left lower extremity bypass graft performed for short distance life-limiting claudication.  He now has high-grade stenosis proximally in the bypass graft with decreased velocities distally concerning for impending graft failure and is indicated for aortogram with possible invention of the bypass graft itself.  Findings: Aorta and iliac segments are free of flow-limiting stenosis by CO2 angiography.  Left lower extremity proximal bypass demonstrated 2 areas of webbing that were flow-limiting and after laser arthrectomy and balloon angioplasty there was improvement in flow limitation although these were still evident and these were primarily stented.  Proximally the stent was fully opposed to the entire bypass graft distally it appears to only be opposed in the most distal aspect of the stent with some of the bypass being larger than the stent itself likely due to poststenotic dilatation.  At completion there is brisk flow through the stent and the bypass graft.  There is three-vessel runoff to distal leg proximal ankle dominant via the posterior tibial  artery.   Procedure:  The patient was identified in the holding area and taken to room 8.  The patient was then placed supine on the table and prepped and draped in the usual sterile fashion.  A time out was called.  Ultrasound was used to evaluate the right common femoral artery which was free of disease.  The area was no signs of 1% lidocaine cannulated external followed by wire and sheath.  And images saved to the permanent record.  We placed a Bentson wire followed by a 5 French sheath and Omni catheter to the level of the wound CO2 aortogram was performed and pulled down to the bifurcation limited lower extremity runoff was performed bilaterally.  We then crossed the bifurcation using a crossover catheter with Glidewire and performed left lower extremity angiography and selective the bypass graft itself performing angiography with CO2 and contrast.  The above-noted findings were identified.  We placed a long 7 French sheath patient was heparinized with 5000's of heparin.  We placed an 014 wire through the bypass and then performed laser arthrectomy with a 2.35 mm laser.  After this we ballooned the bypass itself with 6 mm balloon and then performed angiography and elected for stenting with drug-eluting stent 6 x 150 that proximally was well opposed distally there was some mall apposition but the very most distal aspect it was opposed to the wall of the bypass and hopefully this will remodel in the interim where he does not feel requires.  Satisfied with this we exchanged for a short 7 [place] sheath with Mynx device.  Patient tolerated procedure well without immediate complication.   Contrast 35 cc   Tayja Manzer C. Randie Heinz, MD [facility] Office: [phone] Pager: [phone]

## 2023-03-23 ENCOUNTER — Encounter (HOSPITAL_COMMUNITY): Payer: Self-pay | Admitting: Vascular Surgery

## 2023-03-25 DIAGNOSIS — E1122 Type 2 diabetes mellitus with diabetic chronic kidney disease: Secondary | ICD-10-CM | POA: Diagnosis not present

## 2023-03-25 DIAGNOSIS — I739 Peripheral vascular disease, unspecified: Secondary | ICD-10-CM | POA: Diagnosis not present

## 2023-03-25 DIAGNOSIS — N1832 Chronic kidney disease, stage 3b: Secondary | ICD-10-CM | POA: Diagnosis not present

## 2023-03-25 DIAGNOSIS — I129 Hypertensive chronic kidney disease with stage 1 through stage 4 chronic kidney disease, or unspecified chronic kidney disease: Secondary | ICD-10-CM | POA: Diagnosis not present

## 2023-03-25 DIAGNOSIS — I252 Old myocardial infarction: Secondary | ICD-10-CM | POA: Diagnosis not present

## 2023-03-25 DIAGNOSIS — N261 Atrophy of kidney (terminal): Secondary | ICD-10-CM | POA: Diagnosis not present

## 2023-03-30 ENCOUNTER — Telehealth: Payer: Self-pay | Admitting: Vascular Surgery

## 2023-03-30 NOTE — Telephone Encounter (Signed)
-----   Message from Maeola Harman, MD sent at 03/22/2023 11:53 AM EDT ----- Harold Waters [id number] Sep 11, 1946  03/22/2023 Pre-operative Diagnosis: Peripheral arterial disease with impending graft failure left femoral to popliteal artery bypass graft originally performed for life-limiting claudication  Surgeon:  Luanna Salk. Randie Heinz, MD  Procedure Performed: 1.  Ultrasound-guided cannulation and percutaneous closure with Mynx device right common femoral artery 2.  Aortogram with selective catheterization of left lower extremity bypass graft and left lower extremity angiography 3.  Laser athrectomy of the left femoral to above-knee popliteal bypass graft with 2.69mm Auryon 4.  Stent of left lower extremity femoropopliteal bypass using 6 x 150 mm Eluvia 5.  Sedation with fentanyl and Versed for 53 minutes  Please have patient follow-up in 4 to 6 weeks with PA or me with left lower extremity arterial bypass graft duplex and ABIs.  [name]

## 2023-04-14 ENCOUNTER — Telehealth: Payer: Self-pay

## 2023-04-14 DIAGNOSIS — I351 Nonrheumatic aortic (valve) insufficiency: Secondary | ICD-10-CM

## 2023-04-14 DIAGNOSIS — I25110 Atherosclerotic heart disease of native coronary artery with unstable angina pectoris: Secondary | ICD-10-CM

## 2023-04-14 NOTE — Telephone Encounter (Signed)
The patient daughter [name] has been notified of the result and verbalized understanding.  All questions (if any) were answered. Macie Burows, RN 04/14/2023 12:05 PM

## 2023-04-14 NOTE — Telephone Encounter (Signed)
-----   Message from Lendon Ka, RN sent at 03/26/2023  4:10 PM EDT ----- Left message for patient to call back.

## 2023-04-16 ENCOUNTER — Other Ambulatory Visit: Payer: Self-pay | Admitting: *Deleted

## 2023-04-16 DIAGNOSIS — I739 Peripheral vascular disease, unspecified: Secondary | ICD-10-CM

## 2023-04-16 DIAGNOSIS — I70213 Atherosclerosis of native arteries of extremities with intermittent claudication, bilateral legs: Secondary | ICD-10-CM

## 2023-04-16 DIAGNOSIS — T82858A Stenosis of vascular prosthetic devices, implants and grafts, initial encounter: Secondary | ICD-10-CM

## 2023-04-26 ENCOUNTER — Ambulatory Visit (HOSPITAL_COMMUNITY)
Admission: RE | Admit: 2023-04-26 | Discharge: 2023-04-26 | Disposition: A | Discharge status: Home / Self Care | Payer: PPO | Source: Ambulatory Visit | Attending: Surgery | Admitting: Surgery

## 2023-04-26 ENCOUNTER — Ambulatory Visit (INDEPENDENT_AMBULATORY_CARE_PROVIDER_SITE_OTHER)
Admission: RE | Admit: 2023-04-26 | Discharge: 2023-04-26 | Disposition: A | Discharge status: Home / Self Care | Payer: PPO | Source: Ambulatory Visit | Attending: Surgery | Admitting: Surgery

## 2023-04-26 DIAGNOSIS — I739 Peripheral vascular disease, unspecified: Secondary | ICD-10-CM

## 2023-04-26 DIAGNOSIS — I70213 Atherosclerosis of native arteries of extremities with intermittent claudication, bilateral legs: Secondary | ICD-10-CM | POA: Insufficient documentation

## 2023-04-26 DIAGNOSIS — T82858A Stenosis of vascular prosthetic devices, implants and grafts, initial encounter: Secondary | ICD-10-CM | POA: Diagnosis not present

## 2023-04-26 LAB — VAS US ABI WITH/WO TBI
Left ABI: 1.01
Right ABI: 1.04

## 2023-04-28 ENCOUNTER — Ambulatory Visit (INDEPENDENT_AMBULATORY_CARE_PROVIDER_SITE_OTHER): Payer: PPO | Admitting: Physician Assistant

## 2023-04-28 ENCOUNTER — Encounter: Payer: Self-pay | Admitting: Physician Assistant

## 2023-04-28 VITALS — BP 149/72 | HR 65 | Temp 98.4°F | Resp 18 | Ht 65.0 in | Wt 168.1 lb

## 2023-04-28 DIAGNOSIS — I872 Venous insufficiency (chronic) (peripheral): Secondary | ICD-10-CM

## 2023-04-28 DIAGNOSIS — Z9889 Other specified postprocedural states: Secondary | ICD-10-CM

## 2023-04-28 DIAGNOSIS — I739 Peripheral vascular disease, unspecified: Secondary | ICD-10-CM

## 2023-04-28 NOTE — Progress Notes (Signed)
Office Note     CC:  follow up Requesting Provider:  Plotnikov, Georgina Quint, MD  HPI: Harold Waters is a 77 y.o. (04-Nov-1945) male who presents for follow up of peripheral artery disease. He has history of left common femoral to above knee popliteal artery bypass with vein by Dr. Randie Heinz on 12/06/18. He overall has done very well since his surgery. He has had some persistent left lower extremity edema. This has been managed with compression stockings. No recurrent rest pain, claudication or tissue loss. He additionally was complaining of continued bilateral burning and pain in his legs and lower back. He was referred to Orthopedics for evaluation. Also, at his last visit he was noted to have some decreased velocities within his bypass graft that were concerning for threatened bypass. He was therefore scheduled for an Aortogram, Arteriogram of LLE with Dr. Randie Heinz on 03/22/23. At that time he had lazer atherectomy of the left femoral to AK popliteal bypass with stenting of the bypass. Three vessel runoff noted.   Today he returns for follow up with non invasive studies. He reports legs are feeling better. He continues to have pain in his hips and low back on bending, walking or lifting. He has not seen orthopedics for evaluation. He otherwise does not have any pain in his lower legs or feet on ambulation or rest. No tissue loss. He continues to wear compression stocking on left leg but reports swelling is almost resolved. He is medically managed on Aspirin, Plavix and statin.   Past Medical History:  Diagnosis Date   Angina    Chronic stomach ulcer    "get them off and on" (09/19/2018)   Coronary atherosclerosis of unspecified type of vessel, native or graft    Diverticulosis    DVT of lower extremity (deep venous thrombosis) (HCC) ~ 2010   LLE   Erosive gastritis    Essential hypertension, benign    GERD (gastroesophageal reflux disease)    Headache(784.0) 02/18/12   "lately"   History of kidney  stones    Internal hemorrhoids    Lower back pain    Myocardial infarction (HCC) 1997   Osteoarthritis    Other and unspecified hyperlipidemia    PAD (peripheral artery disease) (HCC)    with ABI's 0.8 on the right and 0.86 on the left   Pneumonia 1957   PSVT (paroxysmal supraventricular tachycardia) 02/18/12   Shortness of breath    "lying down"   Sleep apnea    does not wear c-pap; pt does not recall this hx on 09/19/2018    Past Surgical History:  Procedure Laterality Date   ABDOMINAL AORTOGRAM W/LOWER EXTREMITY N/A 03/22/2023   Procedure: ABDOMINAL AORTOGRAM W/LOWER EXTREMITY;  Surgeon: Maeola Harman, MD;  Location: [facility] INVASIVE CV LAB;  Service: Cardiovascular;  Laterality: N/A;   ARTHROSCOPY KNEE W/ DRILLING Right ~ 2001   BYPASS GRAFT Left 12/05/2018   femoral popliteal    CARDIAC CATHETERIZATION  09/19/2018   CATARACT EXTRACTION     COLONOSCOPY     CORONARY ANGIOPLASTY WITH STENT PLACEMENT  1997   "2"   CORONARY PRESSURE/FFR STUDY  09/19/2018   CORONARY PRESSURE/FFR STUDY N/A 09/19/2018   Procedure: INTRAVASCULAR PRESSURE WIRE/FFR STUDY;  Surgeon: Runell Gess, MD;  Location: [facility] INVASIVE CV LAB;  Service: Cardiovascular;  Laterality: N/A;   FEMORAL-POPLITEAL BYPASS GRAFT Left 12/06/2018   Procedure: BYPASS GRAFT FEMORAL-POPLITEAL ARTERY;  Surgeon: Maeola Harman, MD;  Location: [facility] OR;  Service: Vascular;  Laterality:  Left;   ILIAC ARTERY STENT  04/22/2017   . Placement of a 6 mm x 100 mm Viabahn covered stent left SFA   LEFT HEART CATH AND CORONARY ANGIOGRAPHY N/A 09/19/2018   Procedure: LEFT HEART CATH AND CORONARY ANGIOGRAPHY;  Surgeon: Runell Gess, MD;  Location: [facility] INVASIVE CV LAB;  Service: Cardiovascular;  Laterality: N/A;   LEFT HEART CATHETERIZATION WITH CORONARY ANGIOGRAM N/A 02/19/2012   Procedure: LEFT HEART CATHETERIZATION WITH CORONARY ANGIOGRAM;  Surgeon: Laurey Morale, MD;  Location: [facility] CATH LAB;  Service: Cardiovascular;   Laterality: N/A;   LOWER EXTREMITY ANGIOGRAPHY Bilateral 04/22/2017   Procedure: Lower Extremity Angiography;  Surgeon: Runell Gess, MD;  Location: [facility] INVASIVE CV LAB;  Service: Cardiovascular;  Laterality: Bilateral;   LOWER EXTREMITY ANGIOGRAPHY Bilateral 09/19/2018   LOWER EXTREMITY ANGIOGRAPHY Bilateral 09/19/2018   Procedure: LOWER EXTREMITY ANGIOGRAPHY;  Surgeon: Runell Gess, MD;  Location: [facility] INVASIVE CV LAB;  Service: Cardiovascular;  Laterality: Bilateral;   PERIPHERAL ARTERIAL STENT GRAFT  09/2010   LLE   PERIPHERAL VASCULAR ATHERECTOMY Left 04/22/2017   Procedure: Peripheral Vascular Atherectomy;  Surgeon: Runell Gess, MD;  Location: [facility] INVASIVE CV LAB;  Service: Cardiovascular;  Laterality: Left;  SFA   PERIPHERAL VASCULAR ATHERECTOMY  03/22/2023   Procedure: PERIPHERAL VASCULAR ATHERECTOMY;  Surgeon: Maeola Harman, MD;  Location: [facility] INVASIVE CV LAB;  Service: Cardiovascular;;   PERIPHERAL VASCULAR INTERVENTION Left 04/22/2017   Procedure: Peripheral Vascular Intervention;  Surgeon: Runell Gess, MD;  Location: [facility] INVASIVE CV LAB;  Service: Cardiovascular;  Laterality: Left;  SFA   PERIPHERAL VASCULAR INTERVENTION  03/22/2023   Procedure: PERIPHERAL VASCULAR INTERVENTION;  Surgeon: Maeola Harman, MD;  Location: [facility] INVASIVE CV LAB;  Service: Cardiovascular;;   UPPER GASTROINTESTINAL ENDOSCOPY      Social History   Socioeconomic History   Marital status: Married    Spouse name: Not on file   Number of children: 2   Years of education: Not on file   Highest education level: Not on file  Occupational History   Occupation: retired  Tobacco Use   Smoking status: Former    Packs/day: 1.00    Years: 40.00    Additional pack years: 0.00    Total pack years: 40.00    Types: Cigarettes    Quit date: 04/03/2010    Years since quitting: 13.0    Passive exposure: Never   Smokeless tobacco: Never  Vaping Use   Vaping Use: Never used   Substance and Sexual Activity   Alcohol use: Not Currently    Comment: "used to drink when I was young; quit ~ 1980's"   Drug use: Never   Sexual activity: Yes  Other Topics Concern   Not on file  Social History Narrative   Right handed   Two story home   4 children   Wife lives with him in the home   Caffeine on occasion   Social Determinants of Health   Financial Resource Strain: Low Risk  (06/08/2022)   Overall Financial Resource Strain (CARDIA)    Difficulty of Paying Living Expenses: Not hard at all  Food Insecurity: No Food Insecurity (06/08/2022)   Hunger Vital Sign    Worried About Running Out of Food in the Last Year: Never true    Ran Out of Food in the Last Year: Never true  Transportation Needs: No Transportation Needs (06/08/2022)   PRAPARE - Administrator, Civil Service (Medical): No  Lack of Transportation (Non-Medical): No  Physical Activity: Insufficiently Active (06/08/2022)   Exercise Vital Sign    Days of Exercise per Week: 3 days    Minutes of Exercise per Session: 30 min  Stress: No Stress Concern Present (06/08/2022)   [employer] of Occupational Health - Occupational Stress Questionnaire    Feeling of Stress : Not at all  Social Connections: Moderately Integrated (06/08/2022)   Social Connection and Isolation Panel [NHANES]    Frequency of Communication with Friends and Family: Three times a week    Frequency of Social Gatherings with Friends and Family: Three times a week    Attends Religious Services: More than 4 times per year    Active Member of Clubs or Organizations: No    Attends Banker Meetings: Never    Marital Status: Married  Catering manager Violence: Not At Risk (06/08/2022)   Humiliation, Afraid, Rape, and Kick questionnaire    Fear of Current or Ex-Partner: No    Emotionally Abused: No    Physically Abused: No    Sexually Abused: No    Family History  Problem Relation Age of Onset   Ulcers Father         had stomach issues, not sure what happened   Coronary artery disease Brother        male 1st degree relative <50   Colon cancer Neg Hx    Esophageal cancer Neg Hx    Rectal cancer Neg Hx    Stomach cancer Neg Hx     Current Outpatient Medications  Medication Sig Dispense Refill   aspirin EC (ASPIRIN LOW DOSE) 81 MG tablet Take 1 tablet (81 mg total) by mouth daily. Annual appt due in July must see provider for future refills 100 tablet 0   Blood Glucose Monitoring Suppl (ONETOUCH VERIO) w/Device KIT 1 Units by Does not apply route daily as needed. 1 kit 1   chlorthalidone (HYGROTON) 25 MG tablet Take 25 mg by mouth daily.     Cholecalciferol (VITAMIN D3) 50 MCG (2000 UT) capsule Take 1 capsule (2,000 Units total) by mouth daily. 100 capsule 3   clopidogrel (PLAVIX) 75 MG tablet Take 1 tablet (75 mg total) by mouth daily. 30 tablet 11   glucose blood (ONETOUCH VERIO) test strip Use to check blood sugar daily 50 each 11   isosorbide mononitrate (IMDUR) 30 MG 24 hr tablet Take 1 tablet (30 mg total) by mouth daily. 90 tablet 3   memantine (NAMENDA) 10 MG tablet Take 1 tablet (10 mg total) by mouth at bedtime. 90 tablet 3   Multiple Vitamins-Minerals (MENS MULTIVITAMIN PLUS) TABS Take 1 tablet by mouth at bedtime.     nitroGLYCERIN (NITROSTAT) 0.4 MG SL tablet Place 1 tablet under tongue every 5 mins. DO NOT USE MORE THAN 3 TABS 25 tablet 2   olmesartan (BENICAR) 5 MG tablet Take 5 mg by mouth in the morning and at bedtime.     polyethylene glycol powder (GLYCOLAX/MIRALAX) 17 GM/SCOOP powder Take 17 g by mouth 2 (two) times daily as needed for moderate constipation. 500 g 3   rosuvastatin (CRESTOR) 20 MG tablet Take 1 tablet (20 mg total) by mouth daily. 90 tablet 3   sildenafil (VIAGRA) 100 MG tablet Take 50 mg by mouth daily as needed for erectile dysfunction.     tamsulosin (FLOMAX) 0.4 MG CAPS capsule Take 1 capsule (0.4 mg total) by mouth daily after supper. (Patient taking differently:  Take 0.4 mg by  mouth at bedtime as needed (urinary retention).) 30 capsule 5   traZODone (DESYREL) 50 MG tablet TAKE 1/4 TO 1/2 TABLET BY MOUTH AT NIGHT AS NEEDED FOR INSOMNIA (Patient taking differently: Take 25 mg by mouth at bedtime.) 45 tablet 3   No current facility-administered medications for this visit.    Allergies  Allergen Reactions   Egg-Derived Products Itching   Aricept [Donepezil] Nausea And Vomiting   Coreg [Carvedilol] Other (See Comments)    Fatigue     REVIEW OF SYSTEMS:  [X]  denotes positive finding, [ ]  denotes negative finding Cardiac  Comments:  Chest pain or chest pressure:    Shortness of breath upon exertion:    Short of breath when lying flat:    Irregular heart rhythm:        Vascular    Pain in calf, thigh, or hip brought on by ambulation:    Pain in feet at night that wakes you up from your sleep:     Blood clot in your veins:    Leg swelling:         Pulmonary    Oxygen at home:    Productive cough:     Wheezing:         Neurologic    Sudden weakness in arms or legs:     Sudden numbness in arms or legs:     Sudden onset of difficulty speaking or slurred speech:    Temporary loss of vision in one eye:     Problems with dizziness:         Gastrointestinal    Blood in stool:     Vomited blood:         Genitourinary    Burning when urinating:     Blood in urine:        Psychiatric    Major depression:         Hematologic    Bleeding problems:    Problems with blood clotting too easily:        Skin    Rashes or ulcers:        Constitutional    Fever or chills:      PHYSICAL EXAMINATION:  Vitals:   04/28/23 0906  BP: (!) 149/72  Pulse: 65  Resp: 18  Temp: 98.4 F (36.9 C)  TempSrc: Temporal  SpO2: 96%  Weight: 168 lb 1.6 oz (76.2 kg)  Height: 5\' 5"  (1.651 m)    General:  WDWN in NAD; vital signs documented above Gait: Normal HENT: WNL, normocephalic Pulmonary: normal non-labored breathing , without   wheezing Cardiac: regular HR Abdomen: soft, NT, no masses Vascular Exam/Pulses: 2+ femoral pulses, 2+ Dp and PT pulses bilaterally. Feet warm and well perfused Extremities: without ischemic changes, without Gangrene , without cellulitis; without open wounds;  Musculoskeletal: no muscle wasting or atrophy  Neurologic: A&O X 3 Psychiatric:  The pt has Normal affect.   Non-Invasive Vascular Imaging:   +-------+-----------+-----------+------------+------------+  ABI/TBIToday's ABIToday's TBIPrevious ABIPrevious TBI  +-------+-----------+-----------+------------+------------+  Right 1.04       0.69       1.06        0.87          +-------+-----------+-----------+------------+------------+  Left  1.01       0.42       0.98        0.70          +-------+-----------+-----------+------------+------------+   Left Stent(s):  +------------------------------------+--------+--------+---------+--------+   Within proximal fem-pop bypass graftPSV  cm/sStenosisWaveform  Comments  +------------------------------------+--------+--------+---------+--------+   Prox to Stent                       125             biphasic            +------------------------------------+--------+--------+---------+--------+   Proximal Stent                      100             triphasic           +------------------------------------+--------+--------+---------+--------+   Mid Stent                           112             biphasic            +------------------------------------+--------+--------+---------+--------+   Distal Stent                        50              triphasic           +------------------------------------+--------+--------+---------+--------+   Distal to Stent                     63              triphasic           +------------------------------------+--------+--------+---------+--------+    Left Graft #1: Fem-pop   +--------------------+--------+--------+---------+----------------------+                     PSV cm/sStenosisWaveform Comments                +--------------------+--------+--------+---------+----------------------+  Inflow             148             triphasic                        +--------------------+--------+--------+---------+----------------------+  Proximal Anastomosis89              biphasic                         +--------------------+--------+--------+---------+----------------------+  Proximal Graft                               Stent- see stent table  +--------------------+--------+--------+---------+----------------------+  Mid Graft           49              triphasic                        +--------------------+--------+--------+---------+----------------------+  Distal Graft        58              triphasic                        +--------------------+--------+--------+---------+----------------------+  Distal Anastomosis  51              triphasic                        +--------------------+--------+--------+---------+----------------------+  Outflow  69              biphasic                         +--------------------+--------+--------+---------+----------------------+     Summary:  Left: Patent stent within proximal left femoral-popliteal bypass graft.   Patent left femoral-popliteal bypass graft without evidence of hemodynamically significant stenosis.    ASSESSMENT/PLAN:: 77 y.o. male here for follow up for PAD. Recently had Aortogram, Arteriogram of LLE bypass graft with atherectomy and stenting on 03/22/23 by Dr. Randie Heinz. Duplex today shows patent left femoral to popliteal bypass graft and patent SFA stent. ABI overall stable. He is without any claudication, rest pain or tissue loss. He is having continued hip and lower back pain. He has not seen Orthopedics regarding this.  - Continue Aspirin, statin,  Plavix - Encourage walking regimen - continue Compression stocking PRN - Will try to refer to Orthopedics again for his low back and hip pain  - He will follow up in 6 months with ABI and LLE bypass graft duplex  Graceann Congress, PA-C [facility] [phone]  Clinic MD:   Cain/ Edilia Bo

## 2023-05-11 NOTE — Progress Notes (Unsigned)
Assessment/Plan:   Vascular dementia without behavioral disturbance  [facility] is a very pleasant 77 y.o. RH malewith a history ofuncontrolled hypertension, hyperlipidemia, PAD, history of migraines with aura, severe sleep apnea on CPAP, history of prostate cancer, status post seed implant seen today in follow up for memory loss. Patient is currently on .     Follow up in   months. Continue memantine 10 mg daily Continue CPAP for OSA Continue trazodone 25 mg nightly for sleep, side effects discussed Recommend good control of her cardiovascular risk factors Continue to control mood as per PCP    Subjective:    This patient is accompanied in the office by *** who supplements the history.  Previous records as well as any outside records available were reviewed prior to todays visit. Patient was last seen on 11/06/2022 with MMSE of 28/30***   Any changes in memory since last visit? ".  He has some difficulties with recent conversations.  Trying to find the name of the product may be more difficult. repeats oneself?  Endorsed Disoriented when walking into a room?  Patient denies ***  Leaving objects in unusual places?  May misplace things but not in unusual places***  Wandering behavior?  denies   Any personality changes since last visit?  Sometimes may have an abrupt temper out of nowhere but no often.*** Any worsening depression?:  denies   Hallucinations or paranoia?  denies   Seizures? denies    Any sleep changes?  Denies vivid dreams, REM behavior or sleepwalking   Sleep apnea?   denies   Any hygiene concerns? denies  Independent of bathing and dressing?  Endorsed  Does the patient needs help with medications?  Patient is in charge *** Who is in charge of the finances?  Patient is in charge   *** Any changes in appetite?  denies ***   Patient have trouble swallowing? denies   Does the patient cook? No Any headaches?   denies   Chronic back pain  denies    Ambulates with difficulty? denies  *** Recent falls or head injuries? denies     Unilateral weakness, numbness or tingling? denies   Any tremors?  denies   Any anosmia?  Patient denies   Any incontinence of urine?  Endorsed***. Wears diapers Any bowel dysfunction?     denies      Patient lives with  *** Does the patient drive? 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  He is daughter works with him "side-by-side ".     History on Initial Assessment 08/23/2019: This is a pleasant 77 year old right-handed man with a history of hypertension, hyperlipidemia, PAD, presenting for evaluation of memory loss. He feels that memory changes started soon after his surgery in February 2020 where he had a left femoral to AK popliteal artery bypass under general anesthesia.  Prior to this, he had minor memory changes, but soon after surgery he noticed that he was unable to do things that he could do easily in the past such as math. He used to do large numbers in the back of his head, but has more difficulty now. He lives with his wife who constantly says something about his memory saying "you forget everything," which upsets him since this was not happening prior to the surgery. He denies getting lost driving but has missed his exit a few times because he is not focusing or forgets where he is going. He  has forgotten bill payments and has had late charges (new as well). He has been forgetting his medications, his wife now helps him. He loses things constantly, his phone is lost all the time. He has put things for the pantry in the freezer. He denies leaving the stove on. He has word-finding difficulties and forgets names more. 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 He has also had a change in sleep pattern, he has always had sleep difficulties getting an  average of 4 hours of sleep, but since the surgery he would only get 2-3 hours of sleep. With Trazodone 1 tab qhs he gets 4 hours. He has daytime drowsiness and would drag his feet because he is so tired. Mood is good. No family history of dementia, no history of significant head injuries or alcohol use.   He gets dizzy when walking or changing clothes, no falls. He recalls an episode of double vision 3-4 years ago, this has resolved. He has occasional difficulty swallowing water. He has some numbness in both hands. He denies any headaches, focal numbness/tingling/weakness, bowel/bladder dysfunction, anosmia, or tremors. He denies any prior history of stroke, head CT done in 07/2019 showed an old left posterior parietal infarct and old left superior cerebellar lacunar infarcts, mild chronic microvascular disease, no acute change.     MRI brain showed mild diffuse atrophy and moderate chronic microvascular disease, chronic small strokes in the left occipital lobe, bilateral cerebellar hemispheres and thalami.  PREVIOUS MEDICATIONS:   CURRENT MEDICATIONS:  Outpatient Encounter Medications as of 05/12/2023  Medication Sig   aspirin EC (ASPIRIN LOW DOSE) 81 MG tablet Take 1 tablet (81 mg total) by mouth daily. Annual appt due in July must see provider for future refills   Blood Glucose Monitoring Suppl (ONETOUCH VERIO) w/Device KIT 1 Units by Does not apply route daily as needed.   chlorthalidone (HYGROTON) 25 MG tablet Take 25 mg by mouth daily.   Cholecalciferol (VITAMIN D3) 50 MCG (2000 UT) capsule Take 1 capsule (2,000 Units total) by mouth daily.   clopidogrel (PLAVIX) 75 MG tablet Take 1 tablet (75 mg total) by mouth daily.   glucose blood (ONETOUCH VERIO) test strip Use to check blood sugar daily   isosorbide mononitrate (IMDUR) 30 MG 24 hr tablet Take 1 tablet (30 mg total) by mouth daily.   memantine (NAMENDA) 10 MG tablet Take 1 tablet (10 mg total) by mouth at bedtime.   Multiple  Vitamins-Minerals (MENS MULTIVITAMIN PLUS) TABS Take 1 tablet by mouth at bedtime.   nitroGLYCERIN (NITROSTAT) 0.4 MG SL tablet Place 1 tablet under tongue every 5 mins. DO NOT USE MORE THAN 3 TABS   olmesartan (BENICAR) 5 MG tablet Take 5 mg by mouth in the morning and at bedtime.   polyethylene glycol powder (GLYCOLAX/MIRALAX) 17 GM/SCOOP powder Take 17 g by mouth 2 (two) times daily as needed for moderate constipation.   rosuvastatin (CRESTOR) 20 MG tablet Take 1 tablet (20 mg total) by mouth daily.   sildenafil (VIAGRA) 100 MG tablet Take 50 mg by mouth daily as needed for erectile dysfunction.   tamsulosin (FLOMAX) 0.4 MG CAPS capsule Take 1 capsule (0.4 mg total) by mouth daily after supper. (Patient taking differently: Take 0.4 mg by mouth at bedtime as needed (urinary retention).)   traZODone (DESYREL) 50 MG tablet TAKE 1/4 TO 1/2 TABLET  BY MOUTH AT NIGHT AS NEEDED FOR INSOMNIA (Patient taking differently: Take 25 mg by mouth at bedtime.)   No facility-administered encounter medications on file as of 05/12/2023.       11/06/2022    6:00 PM 11/07/2021   10:00 AM 05/01/2021    1:00 PM  MMSE - Mini Mental State Exam  Orientation to time 5 5 5   Orientation to Place 5 5 5   Registration 3 3 3   Attention/ Calculation 5 5 5   Recall 1 3 3   Language- name 2 objects 2 2 2   Language- repeat 1 1 1   Language- follow 3 step command 3 3 3   Language- read & follow direction 1 1 1   Write a sentence 1 1 1   Copy design 1 0 1  Total score 28 29 30        No data to display          Objective:     PHYSICAL EXAMINATION:    VITALS:  There were no vitals filed for this visit.  GEN:  The patient appears stated age and is in NAD. HEENT:  Normocephalic, atraumatic.   Neurological examination:  General: NAD, well-groomed, appears stated age. Orientation: The patient is alert. Oriented to person, place and date Cranial nerves: There is good facial symmetry.The speech is fluent and clear. No  aphasia or dysarthria. Fund of knowledge is appropriate. Recent and remote memory are impaired. Attention and concentration are reduced.  Able to name objects and repeat phrases.  Hearing is intact to conversational tone. *** Sensation: Sensation is intact to light touch throughout Motor: Strength is at least antigravity x4. DTR's 2/4 in UE/LE     Movement examination: Tone: There is normal tone in the UE/LE Abnormal movements:  no tremor.  No myoclonus.  No asterixis.   Coordination:  There is no decremation with RAM's. Normal finger to nose  Gait and Station: The patient has no difficulty arising out of a deep-seated chair without the use of the hands. The patient's stride length is good.  Gait is cautious and narrow.    Thank you for allowing [place] the opportunity to participate in the care of this nice patient. Please do not hesitate to contact us for any questions or concerns.   Total time spent on today's visit was *** minutes dedicated to this patient today, preparing to see patient, examining the patient, ordering tests and/or medications and counseling the patient, documenting clinical information in the EHR or other health record, independently interpreting results and communicating results to the patient/family, discussing treatment and goals, answering patient's questions and coordinating care.  Cc:  Plotnikov, Georgina Quint, MD  Marlowe Kays 05/11/2023 1:48 PM

## 2023-05-12 ENCOUNTER — Ambulatory Visit: Payer: PPO | Admitting: Physician Assistant

## 2023-05-12 ENCOUNTER — Encounter: Payer: Self-pay | Admitting: Physician Assistant

## 2023-05-12 VITALS — BP 104/57 | HR 75 | Resp 20 | Ht 65.0 in | Wt 170.0 lb

## 2023-05-12 DIAGNOSIS — F0150 Vascular dementia without behavioral disturbance: Secondary | ICD-10-CM

## 2023-05-12 MED ORDER — MEMANTINE HCL 10 MG PO TABS: 10.0000 mg | ORAL_TABLET | Freq: Every evening | ORAL | 3 refills | Status: DC

## 2023-05-12 NOTE — Patient Instructions (Signed)
It was a pleasure to see you today at our office.   Recommendations:  Follow up in 6  months Continue Memantine 10 mg at night . Side effects were discussed  Follow up sleep apnea  Continue  trazodone (25 mg) at night for sleep as needed   Whom to call:  Memory  decline, memory medications: Call our office [phone]   For psychiatric meds, mood meds: Please have your primary care physician manage these medications.      For assessment of decision of mental capacity and competency:  Call Dr. Erick Blinks, geriatric psychiatrist at [phone]  For guidance in geriatric dementia issues please call [facility] [phone]     If you have any severe symptoms of a stroke, or other severe issues such as confusion,severe chills or fever, etc call 911 or go to the ER as you may need to be evaluated further             RECOMMENDATIONS FOR ALL PATIENTS WITH MEMORY PROBLEMS: 1. Continue to exercise (Recommend 30 minutes of walking everyday, or 3 hours every week) 2. Increase social interactions - continue going to [place] and enjoy social gatherings with friends and family 3. Eat healthy, avoid fried foods and eat more fruits and vegetables 4. Maintain adequate blood pressure, blood sugar, and blood cholesterol level. Reducing the risk of stroke and cardiovascular disease also helps promoting better memory. 5. Avoid stressful situations. Live a simple life and avoid aggravations. Organize your time and prepare for the next day in anticipation. 6. Sleep well, avoid any interruptions of sleep and avoid any distractions in the bedroom that may interfere with adequate sleep quality 7. Avoid sugar, avoid sweets as there is a strong link between excessive sugar intake, diabetes, and cognitive impairment We discussed the Mediterranean diet, which has been shown to help patients reduce the risk of progressive memory disorders and reduces cardiovascular risk. This includes eating  fish, eat fruits and green leafy vegetables, nuts like almonds and hazelnuts, walnuts, and also use olive oil. Avoid fast foods and fried foods as much as possible. Avoid sweets and sugar as sugar use has been linked to worsening of memory function.  There is always a concern of gradual progression of memory problems. If this is the case, then we may need to adjust level of care according to patient needs. Support, both to the patient and caregiver, should then be put into place.    FALL PRECAUTIONS: Be cautious when walking. Scan the area for obstacles that may increase the risk of trips and falls. When getting up in the mornings, sit up at the edge of the bed for a few minutes before getting out of bed. Consider elevating the bed at the head end to avoid drop of blood pressure when getting up. Walk always in a well-lit room (use night lights in the walls). Avoid area rugs or power cords from appliances in the middle of the walkways. Use a walker or a cane if necessary and consider physical therapy for balance exercise. Get your eyesight checked regularly.  FINANCIAL OVERSIGHT: Supervision, especially oversight when making financial decisions or transactions is also recommended.  HOME SAFETY: Consider the safety of the kitchen when operating appliances like stoves, microwave oven, and blender. Consider having supervision and share cooking responsibilities until no longer able to participate in those. Accidents with firearms and other hazards in the house should be identified and addressed as well.   ABILITY TO BE LEFT ALONE: If  patient is unable to contact 911 operator, consider using Life[facility], or when the need is there, arrange for someone to stay with patients. Smoking is a fire hazard, consider supervision or cessation. Risk of wandering should be assessed by caregiver and if detected at any point, supervision and safe proof recommendations should be instituted.  MEDICATION SUPERVISION: Inability  to self-administer medication needs to be constantly addressed. Implement a mechanism to ensure safe administration of the medications.   DRIVING: Regarding driving, in patients with progressive memory problems, driving will be impaired. We advise to have someone else do the driving if trouble finding directions or if minor accidents are reported. Independent driving assessment is available to determine safety of driving.   If you are interested in the driving assessment, you can contact the following:  [employer] in [place] [phone]  [facility] [phone]  [facility] [phone]  [facility] [phone] or [phone]

## 2023-05-14 ENCOUNTER — Other Ambulatory Visit: Payer: Self-pay

## 2023-05-14 DIAGNOSIS — I739 Peripheral vascular disease, unspecified: Secondary | ICD-10-CM

## 2023-05-17 ENCOUNTER — Ambulatory Visit: Payer: PPO | Admitting: Physician Assistant

## 2023-05-19 ENCOUNTER — Ambulatory Visit (HOSPITAL_BASED_OUTPATIENT_CLINIC_OR_DEPARTMENT_OTHER): Payer: PPO | Admitting: Orthopaedic Surgery

## 2023-05-19 ENCOUNTER — Other Ambulatory Visit: Payer: PPO

## 2023-06-08 ENCOUNTER — Ambulatory Visit: Payer: PPO | Attending: Internal Medicine

## 2023-06-08 DIAGNOSIS — I351 Nonrheumatic aortic (valve) insufficiency: Secondary | ICD-10-CM | POA: Diagnosis not present

## 2023-06-08 DIAGNOSIS — I25110 Atherosclerotic heart disease of native coronary artery with unstable angina pectoris: Secondary | ICD-10-CM | POA: Diagnosis not present

## 2023-06-08 LAB — ALT: ALT: 15 IU/L (ref 0–44)

## 2023-06-08 LAB — LIPID PANEL
Chol/HDL Ratio: 4.9 ratio (ref 0.0–5.0)
Cholesterol, Total: 225 mg/dL — ABNORMAL HIGH (ref 100–199)
HDL: 46 mg/dL (ref >39)
LDL Chol Calc (NIH): 138 mg/dL — ABNORMAL HIGH (ref 0–99)
Triglycerides: 231 mg/dL — ABNORMAL HIGH (ref 0–149)
VLDL Cholesterol Cal: 41 mg/dL — ABNORMAL HIGH (ref 5–40)

## 2023-06-15 ENCOUNTER — Telehealth: Payer: Self-pay

## 2023-06-15 DIAGNOSIS — I25110 Atherosclerotic heart disease of native coronary artery with unstable angina pectoris: Secondary | ICD-10-CM

## 2023-06-15 MED ORDER — EZETIMIBE 10 MG PO TABS: 10.0000 mg | ORAL_TABLET | Freq: Every day | ORAL | 3 refills | Status: DC

## 2023-06-15 NOTE — Telephone Encounter (Signed)
-----   Message from Christell Constant sent at 06/11/2023 12:26 PM EDT ----- Results: LDL above goal on highest tolerate statin Plan: Zetia 10 mg and labs in three months  Christell Constant, MD

## 2023-06-15 NOTE — Telephone Encounter (Signed)
The patient daughter [name] has been notified of the result and verbalized understanding.  All questions (if any) were answered. Macie Burows, RN 06/15/2023 2:46 PM   Daughter reports pt is on an extended vacation.  Will be back in town on 07/08/23.  Daughter will ensure pt starts zetia 10 mg as ordered.  Will have f/u FLP, ALT labs on 10/11/23.  All questions answered.

## 2023-06-15 NOTE — Progress Notes (Deleted)
Cardiology Office Note:  .   Date:  06/15/2023  ID:  Harold Waters, DOB 1946/01/27, MRN [id number] PCP: Tresa Garter, MD  [facility] Providers Cardiologist:  Tonny Bollman, MD { Click to update primary MD,subspecialty MD or APP then REFRESH:1}   History of Present Illness: Harold Waters   Harold Waters is a 77 y.o. male  has a past medical history of coronary artery disease and vascular disease.  He underwent interventions to his RCA and circumflex in the 1990s.  In 2011 he had an unsuccessful attempt at an OM intervention.  A cardiac catheterization in April 2013 resulted in medical therapy.  He is also having vascular disease with prior left S FNA PTA in 2011 with a redo in 2018. A catheterization and November 2019 showed three-vessel coronary artery disease with a 90% RCA, 99% OM 3, 50 to 60% LAD. Again, medical therapy was recommended. His echocardiogram from October 2019 showed normal LVEF. 12/06/2018.  He underwent a successful left femoropopliteal bypass using his SVG.  Last saw Dr. Izora Ribas 03/2023 and feeling poorly PET ordered but not done yet, echo normal LVEF, mild AI  Dr. Randie Heinz 03/2023 impending graft failure left fem to pop treated with laser arthrectomy, stent  ROS: ***  Studies Reviewed: Harold Waters         Prior CV Studies: {Select studies to display:26339}  ***  Risk Assessment/Calculations:   {Does this patient have ATRIAL FIBRILLATION?:[id number]} No BP recorded.  {Refresh Note OR Click here to enter BP  :1}***       Physical Exam:   VS:  There were no vitals taken for this visit.   Wt Readings from Last 3 Encounters:  05/12/23 170 lb (77.1 kg)  04/28/23 168 lb 1.6 oz (76.2 kg)  03/22/23 170 lb (77.1 kg)    GEN: Well nourished, well developed in no acute distress NECK: No JVD; No carotid bruits CARDIAC: ***RRR, no murmurs, rubs, gallops RESPIRATORY:  Clear to auscultation without rales, wheezing or rhonchi  ABDOMEN: Soft, non-tender,  non-distended EXTREMITIES:  No edema; No deformity   ASSESSMENT AND PLAN: .   CAD PCI RCA and CFX 1990 unsuccessful PCI OM 2011, cath 2013, 2019 3V CAD 90% RCA, 99% OM 3, 50 to 60% LADmedical therapy  PAD followed by Dr. Randie Heinz 03/2023 impending graft failure left fem to pop treated with laser arthrectomy, stent  HTN  HLD     {Are you ordering a CV Procedure (e.g. stress test, cath, DCCV, TEE, etc)?   Press F2        :[id number]}  Dispo: ***  Signed, Jacolyn Reedy, PA-C

## 2023-06-18 ENCOUNTER — Encounter: Payer: Self-pay | Admitting: Pharmacist

## 2023-06-18 NOTE — Progress Notes (Signed)
Pharmacy Quality Measure Review  This patient is appearing on a report for being at risk of failing the adherence measure for cholesterol (statin) medications this calendar year.   Medication: rosuvastatin  [facility] report Last fill date: 03/05/2023 for 90 day supply  Checked [facility] database. Actually filled rosuvastatin 20mg  (highest dose he has tolerate) 06/01/2023 for 90 DS.  Insurance report was not up to date. No action needed at this time.   Henrene Pastor, PharmD Clinical Pharmacist [facility]  Population Health [phone]

## 2023-06-29 ENCOUNTER — Ambulatory Visit: Payer: PPO | Admitting: Physician Assistant

## 2023-07-12 ENCOUNTER — Encounter (HOSPITAL_COMMUNITY): Payer: Self-pay

## 2023-07-13 ENCOUNTER — Telehealth (HOSPITAL_COMMUNITY): Payer: Self-pay | Admitting: *Deleted

## 2023-07-13 NOTE — Telephone Encounter (Signed)
Reaching out to patient to offer assistance regarding upcoming cardiac imaging study; pt's daughter answered phone and  verbalizes understanding of appt date/time, parking situation and where to check in, pre-test NPO status and verified current allergies; name and call back number provided for further questions should they arise  Larey Brick RN Navigator Cardiac Imaging [facility] [phone] office [phone] cell  Patient's daughter is aware patient is to avoid Imdur and caffeine prior to his cardiac PET scan.

## 2023-07-14 ENCOUNTER — Encounter (HOSPITAL_COMMUNITY)
Admission: RE | Admit: 2023-07-14 | Discharge: 2023-07-14 | Disposition: A | Discharge status: Home / Self Care | Payer: PPO | Source: Ambulatory Visit | Attending: Internal Medicine | Admitting: Internal Medicine

## 2023-07-14 DIAGNOSIS — I200 Unstable angina: Secondary | ICD-10-CM | POA: Diagnosis not present

## 2023-07-14 LAB — NM PET CT CARDIAC PERFUSION MULTI W/ABSOLUTE BLOODFLOW
LV dias vol: 102 mL (ref 62–150)
LV sys vol: 51 mL
MBFR: 1.61
Nuc Rest EF: 50 %
Nuc Stress EF: 63 %
Rest MBF: 0.66 ml/g/min
Rest Nuclear Isotope Dose: 19.9 mCi
SDS: 5
ST Depression (mm): 0 mm
Stress MBF: 1.06 ml/g/min
Stress Nuclear Isotope Dose: 19.9 mCi

## 2023-07-14 MED ORDER — RUBIDIUM RB82 GENERATOR (RUBYFILL)
19.9200 | PACK | Freq: Once | INTRAVENOUS | Status: AC
  Administered 2023-07-14: 19.92 via INTRAVENOUS

## 2023-07-14 MED ORDER — REGADENOSON 0.4 MG/5ML IV SOLN
INTRAVENOUS | Status: AC
  Filled 2023-07-14: qty 5

## 2023-07-14 MED ORDER — RUBIDIUM RB82 GENERATOR (RUBYFILL)
19.9400 | PACK | Freq: Once | INTRAVENOUS | Status: AC
  Administered 2023-07-14: 19.94 via INTRAVENOUS

## 2023-07-14 MED ORDER — REGADENOSON 0.4 MG/5ML IV SOLN
0.4000 mg | Freq: Once | INTRAVENOUS | Status: AC
  Administered 2023-07-14: 0.4 mg via INTRAVENOUS

## 2023-07-14 NOTE — Progress Notes (Signed)
Patient presents for a cardiac PET stress test and tolerated procedure without incident. Patient maintained acceptable vital signs throughout the test and was offered caffeine after test.  Patient ambulated out of department with a steady gait.  

## 2023-07-18 NOTE — Progress Notes (Signed)
Assessment/Plan:   Vascular dementia without behavioral disturbance***  [facility] is a very pleasant 77 y.o. RH male with a history ofuncontrolled hypertension, hyperlipidemia, PAD, history of migraines with aura, severe sleep apnea noncompliant with CPAP, history of prostate cancer, status post seed implant seen today prior to his scheduled appointment due to self-reported worsening memory loss.  He is on memantine 10 mg daily.  As recalled, she is last MMSE on July 2024 was 30/30.    Follow up in   months. Repeat MRI of the brain to further evaluate his memory complaints, and compared to prior May 2020.[name] Increase memantine to 10 mg twice daily*** Recommend resuming CPAP for OSA, follow-up with prescribing physician Continue trazodone 25 mg nightly for sleep, side effects discussed. Recommend good control of her cardiovascular risk factors.  Plavix and aspirin. Continue to control mood as per PCP     Subjective:    This patient is accompanied in the office by *** who supplements the history.  Previous records as well as any outside records available were reviewed prior to todays visit. Patient was last seen on ***   Any changes in memory since last visit? ".  He has more difficulties with recent conversations or new information.  He has a release to go to the grocery store otherwise he will be forgetting items. repeats oneself?  Endorsed Disoriented when walking into a room?  Patient denies ***  Leaving objects in unusual places?  May misplace things such as glasses and phone but not in unusual places***  Wandering behavior?  denies   Any personality changes since last visit?  Sometimes he may have abrupt temper but not often.  Any worsening depression?:  Denies.   Hallucinations or paranoia?  Denies.   Seizures? denies    Any sleep changes?  Denies vivid dreams, REM behavior or sleepwalking   Sleep apnea?  Endorsed, admits to not using the CPAP*** Any hygiene  concerns? Denies.  Independent of bathing and dressing?  Endorsed  Does the patient needs help with medications?  Patient is in charge *** Who is in charge of the finances?  Patient   is in charge   *** Any changes in appetite?  denies ***   Patient have trouble swallowing? Denies.   Does the patient cook? No Any headaches?   denies   Chronic back pain  denies   Ambulates with difficulty?  He has some arthritic pain, which may limit his ambulation.*** Recent falls or head injuries? denies     Unilateral weakness, numbness or tingling? denies   Any tremors?  Denies   Any anosmia?  Denies   Any incontinence of urine?  Endorsed***, wears diapers Any bowel dysfunction?  Intermittent constipation Patient lives with his wife*** Does the patient drive?  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  His daughter works with him side-by-side.     History on Initial Assessment 08/23/2019: This is a pleasant 77 year old right-handed man with a history of hypertension, hyperlipidemia, PAD, presenting for evaluation of memory loss. He feels that memory changes started soon after his surgery in February 2020 where he had a left femoral to AK popliteal artery bypass under general anesthesia.  Prior to this, he had minor memory changes, but soon after surgery he noticed that he was unable to do things that he could do easily in the past such as math. He used to do large  numbers in the back of his head, but has more difficulty now. He lives with his wife who constantly says something about his memory saying "you forget everything," which upsets him since this was not happening prior to the surgery. He denies getting lost driving but has missed his exit a few times because he is not focusing or forgets where he is going. He has forgotten bill payments and has had late charges (new as well). He has been forgetting his medications, his wife now  helps him. He loses things constantly, his phone is lost all the time. He has put things for the pantry in the freezer. He denies leaving the stove on. He has word-finding difficulties and forgets names more. 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 He has also had a change in sleep pattern, he has always had sleep difficulties getting an average of 4 hours of sleep, but since the surgery he would only get 2-3 hours of sleep. With Trazodone 1 tab qhs he gets 4 hours. He has daytime drowsiness and would drag his feet because he is so tired. Mood is good. No family history of dementia, no history of significant head injuries or alcohol use.   He gets dizzy when walking or changing clothes, no falls. He recalls an episode of double vision 3-4 years ago, this has resolved. He has occasional difficulty swallowing water. He has some numbness in both hands. He denies any headaches, focal numbness/tingling/weakness, bowel/bladder dysfunction, anosmia, or tremors. He denies any prior history of stroke, head CT done in 07/2019 showed an old left posterior parietal infarct and old left superior cerebellar lacunar infarcts, mild chronic microvascular disease, no acute change.     MRI brain showed mild diffuse atrophy and moderate chronic microvascular disease, chronic small strokes in the left occipital lobe, bilateral cerebellar hemispheres and thalami.   PREVIOUS MEDICATIONS:   CURRENT MEDICATIONS:  Outpatient Encounter Medications as of 07/19/2023  Medication Sig   aspirin EC (ASPIRIN LOW DOSE) 81 MG tablet Take 1 tablet (81 mg total) by mouth daily. Annual appt due in July must see provider for future refills   Blood Glucose Monitoring Suppl (ONETOUCH VERIO) w/Device KIT 1 Units by Does not apply route daily as needed.   chlorthalidone (HYGROTON) 25 MG tablet Take 25 mg by mouth daily.    Cholecalciferol (VITAMIN D3) 50 MCG (2000 UT) capsule Take 1 capsule (2,000 Units total) by mouth daily.   clopidogrel (PLAVIX) 75 MG tablet Take 1 tablet (75 mg total) by mouth daily.   cyanocobalamin (VITAMIN B12) 1000 MCG tablet Take 1,000 mcg by mouth daily.   ezetimibe (ZETIA) 10 MG tablet Take 1 tablet (10 mg total) by mouth daily.   glucose blood (ONETOUCH VERIO) test strip Use to check blood sugar daily   isosorbide mononitrate (IMDUR) 30 MG 24 hr tablet Take 1 tablet (30 mg total) by mouth daily.   memantine (NAMENDA) 10 MG tablet Take 1 tablet (10 mg total) by mouth at bedtime.   Multiple Vitamins-Minerals (MENS MULTIVITAMIN PLUS) TABS Take 1 tablet by mouth at bedtime. (Patient not taking: Reported on 05/12/2023)   nitroGLYCERIN (NITROSTAT) 0.4 MG SL tablet Place 1 tablet under tongue every 5 mins. DO NOT USE MORE THAN 3 TABS   olmesartan (BENICAR) 5 MG tablet Take 5 mg by mouth in the morning and at bedtime.  polyethylene glycol powder (GLYCOLAX/MIRALAX) 17 GM/SCOOP powder Take 17 g by mouth 2 (two) times daily as needed for moderate constipation.   rosuvastatin (CRESTOR) 20 MG tablet Take 1 tablet (20 mg total) by mouth daily.   sildenafil (VIAGRA) 100 MG tablet Take 50 mg by mouth daily as needed for erectile dysfunction.   tamsulosin (FLOMAX) 0.4 MG CAPS capsule Take 1 capsule (0.4 mg total) by mouth daily after supper. (Patient taking differently: Take 0.4 mg by mouth at bedtime as needed (urinary retention).)   traZODone (DESYREL) 50 MG tablet TAKE 1/4 TO 1/2 TABLET BY MOUTH AT NIGHT AS NEEDED FOR INSOMNIA (Patient taking differently: Take 25 mg by mouth at bedtime.)   No facility-administered encounter medications on file as of 07/19/2023.       05/12/2023   12:00 PM 11/06/2022    6:00 PM 11/07/2021   10:00 AM  MMSE - Mini Mental State Exam  Orientation to time 5 5 5   Orientation to Place 5 5 5   Registration 3 3 3   Attention/ Calculation 5 5 5   Recall 3 1 3   Language-  name 2 objects 2 2 2   Language- repeat 1 1 1   Language- follow 3 step command 3 3 3   Language- read & follow direction 1 1 1   Write a sentence 1 1 1   Copy design 1 1 0  Total score 30 28 29        No data to display          Objective:     PHYSICAL EXAMINATION:    VITALS:  There were no vitals filed for this visit.  GEN:  The patient appears stated age and is in NAD. HEENT:  Normocephalic, atraumatic.   Neurological examination:  General: NAD, well-groomed, appears stated age. Orientation: The patient is alert. Oriented to person, place and date Cranial nerves: There is good facial symmetry.The speech is fluent and clear. No aphasia or dysarthria. Fund of knowledge is appropriate. Recent and remote memory are impaired. Attention and concentration are reduced.  Able to name objects and repeat phrases.  Hearing is intact to conversational tone. *** Sensation: Sensation is intact to light touch throughout Motor: Strength is at least antigravity x4. DTR's 2/4 in UE/LE     Movement examination: Tone: There is normal tone in the UE/LE Abnormal movements:  no tremor.  No myoclonus.  No asterixis.   Coordination:  There is no decremation with RAM's. Normal finger to nose  Gait and Station: The patient has no difficulty arising out of a deep-seated chair without the use of the hands. The patient's stride length is good.  Gait is cautious and narrow.    Thank you for allowing [place] the opportunity to participate in the care of this nice patient. Please do not hesitate to contact us for any questions or concerns.   Total time spent on today's visit was *** minutes dedicated to this patient today, preparing to see patient, examining the patient, ordering tests and/or medications and counseling the patient, documenting clinical information in the EHR or other health record, independently interpreting results and communicating results to the patient/family, discussing treatment and goals,  answering patient's questions and coordinating care.  Cc:  Plotnikov, Georgina Quint, MD  Marlowe Kays 07/18/2023 4:09 PM

## 2023-07-19 ENCOUNTER — Encounter: Payer: Self-pay | Admitting: Internal Medicine

## 2023-07-19 ENCOUNTER — Ambulatory Visit: Payer: PPO | Attending: Internal Medicine | Admitting: Internal Medicine

## 2023-07-19 ENCOUNTER — Ambulatory Visit: Payer: PPO | Admitting: Physician Assistant

## 2023-07-19 ENCOUNTER — Encounter: Payer: Self-pay | Admitting: Physician Assistant

## 2023-07-19 VITALS — BP 124/63 | HR 63 | Resp 18 | Ht 65.0 in | Wt 167.0 lb

## 2023-07-19 VITALS — BP 90/40 | HR 59 | Ht 65.0 in | Wt 167.8 lb

## 2023-07-19 DIAGNOSIS — I70212 Atherosclerosis of native arteries of extremities with intermittent claudication, left leg: Secondary | ICD-10-CM | POA: Diagnosis not present

## 2023-07-19 DIAGNOSIS — R0602 Shortness of breath: Secondary | ICD-10-CM | POA: Diagnosis not present

## 2023-07-19 DIAGNOSIS — M7989 Other specified soft tissue disorders: Secondary | ICD-10-CM | POA: Insufficient documentation

## 2023-07-19 DIAGNOSIS — E785 Hyperlipidemia, unspecified: Secondary | ICD-10-CM | POA: Diagnosis not present

## 2023-07-19 DIAGNOSIS — E1169 Type 2 diabetes mellitus with other specified complication: Secondary | ICD-10-CM

## 2023-07-19 DIAGNOSIS — F0393 Unspecified dementia, unspecified severity, with mood disturbance: Secondary | ICD-10-CM

## 2023-07-19 DIAGNOSIS — I10 Essential (primary) hypertension: Secondary | ICD-10-CM | POA: Diagnosis not present

## 2023-07-19 DIAGNOSIS — I25110 Atherosclerotic heart disease of native coronary artery with unstable angina pectoris: Secondary | ICD-10-CM | POA: Diagnosis not present

## 2023-07-19 DIAGNOSIS — R413 Other amnesia: Secondary | ICD-10-CM | POA: Diagnosis not present

## 2023-07-19 DIAGNOSIS — I739 Peripheral vascular disease, unspecified: Secondary | ICD-10-CM

## 2023-07-19 DIAGNOSIS — I351 Nonrheumatic aortic (valve) insufficiency: Secondary | ICD-10-CM

## 2023-07-19 MED ORDER — MEMANTINE HCL ER 21 MG PO CP24: 21.0000 mg | ORAL_CAPSULE | Freq: Every evening | ORAL | 11 refills | Status: DC

## 2023-07-19 MED ORDER — SERTRALINE HCL 25 MG PO TABS: 25.0000 mg | ORAL_TABLET | Freq: Every day | ORAL | 11 refills | Status: DC

## 2023-07-19 NOTE — Patient Instructions (Addendum)
It was a pleasure to see you today at our office.   Recommendations:  Follow up in Jan 2025  MRI brain to follow up on the structure and circulation  Neuropsych evaluation  Start Memantine XR 21 mg at night . Side effects were discussed  Follow up sleep apnea with Dr. Posey Rea regarding CPAP Start sertraline 25 mg at night, when  you start, discontinue trazodone  Whom to call:  Memory  decline, memory medications: Call our office [phone]   For psychiatric meds, mood meds: Please have your primary care physician manage these medications.      For assessment of decision of mental capacity and competency:  Call Dr. Erick Blinks, geriatric psychiatrist at [phone]  For guidance in geriatric dementia issues please call [facility] [phone]     If you have any severe symptoms of a stroke, or other severe issues such as confusion,severe chills or fever, etc call 911 or go to the ER as you may need to be evaluated further             RECOMMENDATIONS FOR ALL PATIENTS WITH MEMORY PROBLEMS: 1. Continue to exercise (Recommend 30 minutes of walking everyday, or 3 hours every week) 2. Increase social interactions - continue going to [place] and enjoy social gatherings with friends and family 3. Eat healthy, avoid fried foods and eat more fruits and vegetables 4. Maintain adequate blood pressure, blood sugar, and blood cholesterol level. Reducing the risk of stroke and cardiovascular disease also helps promoting better memory. 5. Avoid stressful situations. Live a simple life and avoid aggravations. Organize your time and prepare for the next day in anticipation. 6. Sleep well, avoid any interruptions of sleep and avoid any distractions in the bedroom that may interfere with adequate sleep quality 7. Avoid sugar, avoid sweets as there is a strong link between excessive sugar intake, diabetes, and cognitive impairment We discussed the Mediterranean diet, which has  been shown to help patients reduce the risk of progressive memory disorders and reduces cardiovascular risk. This includes eating fish, eat fruits and green leafy vegetables, nuts like almonds and hazelnuts, walnuts, and also use olive oil. Avoid fast foods and fried foods as much as possible. Avoid sweets and sugar as sugar use has been linked to worsening of memory function.  There is always a concern of gradual progression of memory problems. If this is the case, then we may need to adjust level of care according to patient needs. Support, both to the patient and caregiver, should then be put into place.    FALL PRECAUTIONS: Be cautious when walking. Scan the area for obstacles that may increase the risk of trips and falls. When getting up in the mornings, sit up at the edge of the bed for a few minutes before getting out of bed. Consider elevating the bed at the head end to avoid drop of blood pressure when getting up. Walk always in a well-lit room (use night lights in the walls). Avoid area rugs or power cords from appliances in the middle of the walkways. Use a walker or a cane if necessary and consider physical therapy for balance exercise. Get your eyesight checked regularly.  FINANCIAL OVERSIGHT: Supervision, especially oversight when making financial decisions or transactions is also recommended.  HOME SAFETY: Consider the safety of the kitchen when operating appliances like stoves, microwave oven, and blender. Consider having supervision and share cooking responsibilities until no longer able to participate in those. Accidents with firearms and other  hazards in the house should be identified and addressed as well.   ABILITY TO BE LEFT ALONE: If patient is unable to contact 911 operator, consider using [employer], or when the need is there, arrange for someone to stay with patients. Smoking is a fire hazard, consider supervision or cessation. Risk of wandering should be assessed by caregiver and  if detected at any point, supervision and safe proof recommendations should be instituted.  MEDICATION SUPERVISION: Inability to self-administer medication needs to be constantly addressed. Implement a mechanism to ensure safe administration of the medications.   DRIVING: Regarding driving, in patients with progressive memory problems, driving will be impaired. We advise to have someone else do the driving if trouble finding directions or if minor accidents are reported. Independent driving assessment is available to determine safety of driving.   If you are interested in the driving assessment, you can contact the following:  [employer] in [place] [phone]  [facility] [phone]  [facility] [phone]  [place] [phone] or [phone]  MRI at [facility] [phone]

## 2023-07-19 NOTE — Addendum Note (Signed)
Addended by: Marlowe Kays E on: 07/19/2023 12:05 PM   Modules accepted: Level of Service

## 2023-07-19 NOTE — Progress Notes (Signed)
Cardiology Office Note:  .    Date:  07/19/2023  ID:  Harold Waters, DOB [birth year], MRN [id number] PCP: Tresa Garter, MD  [facility] Providers Cardiologist:  Tonny Bollman, MD     CC: DOD- follow up stress testing  History of Present Illness: Harold Waters    Harold Waters is a 77 y.o. male with a history of coronary artery disease and peripheral arterial disease, presents for evaluation after a stress test. He had a peripheral arterial disease intervention in May.  He had chest pain and a cardiac PET myocardial perfusion imaging test. His nephrologist was trying to wean him off blood pressure medications due to symptomatic hypotension. He had lower extremity swelling and was experiencing chest pain that radiated to his arm, which led to a stress test. Since starting Imdur 30 mg this has resolved.  Patient notes that he is doing much better.   Since last visit notes notes that he is getting worked up for worsening memory since he returned for [place] .  No chest pain or pressure .  Notes SOB and orthopnea.  Has SOB with 9/11 PET MPI study after Lexiscan. Notes orthopnea.  Stable leg swelling.  No palpitations or syncope.  Relevant histories: .  Social: comes with daughter, he is from [place] ROS: As per HPI.   Studies Reviewed: Harold Waters    DIAGNOSTIC Echocardiogram: Mild to moderate aortic insufficiency, mild aortic stenosis, preserved left ventricular function Stress test: Multiple areas of reversible ischemia, regional defect in circumflex and RCA (07/14/2023)  Cardiac Studies & Procedures   CARDIAC CATHETERIZATION  CARDIAC CATHETERIZATION 09/19/2018  Narrative Images from the original result were not included.   Prox RCA to Mid RCA lesion is 90% stenosed.  Ost 2nd Mrg to 2nd Mrg lesion is 99% stenosed.  Prox LAD lesion is 60% stenosed.  Ost 1st Diag lesion is 50% stenosed.   Harold Waters is a 77 y.o. male   [id number] LOCATION:  FACILITY:  [facility] PHYSICIAN: Nanetta Batty, M.D. [birth year]   DATE OF PROCEDURE:  09/19/2018  DATE OF DISCHARGE:     CARDIAC CATHETERIZATION    History obtained from chart review.Harold Waters is a 77 y.o.  thin-appearing married Latino male father of 4 children, grandfather and grandchildren, referred by Dr. Excell Seltzer for peripheral vascular evaluation. I last saw him in the office 05/07/2017. He has a history of treated hyperlipidemia. He has had a remote stroke and has had intervention on his nondominant right answer complex coronary arteries in the past. He stopped smoking 8 years ago but did smoke a pack a day for 40 years. He had a stent placed in his left SFA November 2011 by Dr. Excell Seltzer which resulted in improvement in his claudication. Over the last several months he's had reappearance of his claudication which is now lifestyle limiting. Dopplers performed 03/22/17 show a decline in his ABI from 0.96-2.71 with what appears to be an occluded left SFA stent.  I perform peripheral angiography and endovascular intervention on him 04/22/17. He had an occluded left SFA stent which I opened up and placed a Viabahn  covered stent with an excellent result. He did have 3 vessel. His claudication has completely resolved.  Since I saw him a year ago he has developed some recurrent left calf claudication.  He had lower extremity Dopplers performed 11/24/2017 that revealed normal ABIs bilaterally with a patent stent.  In addition, he has noticed some increasing exertional chest pain and shortness of breath.  the left circumflex territory (1.42), but is also mildly reduced in the right coronary artery territory   Coronary calcium assessment not performed due to prior revascularization.   Electronically signed by: Thurmon Fair, MD  EXAM: OVER-READ INTERPRETATION  CT CHEST  The following report is a limited chest CT over-read performed by radiologist Dr. Irma Newness [facility] on 07/14/2023. This over-read does not include interpretation of cardiac or coronary anatomy or pathology nor does it include evaluation  of the PET data. The cardiac PET-CT interpretation by the cardiologist is attached.  COMPARISON:  Chest radiographs 10/15/2022.  Abdominal CT 02/23/2008  FINDINGS: Mediastinum/Nodes: No enlarged lymph nodes within the visualized mediastinum.Aortic atherosclerosis and aortic valvular calcifications are noted.  Lungs/Pleura: There is no pleural effusion. Possible mild underlying central airway thickening. The visualized lungs are otherwise clear.  Upper abdomen: No significant findings in the visualized upper abdomen.  Musculoskeletal/Chest wall: No chest wall mass or suspicious osseous findings within the visualized chest.  IMPRESSION: No significant extracardiac findings within the visualized chest.   Electronically Signed By: Harold Waters M.D. On: 07/14/2023 11:38   ECHOCARDIOGRAM  ECHOCARDIOGRAM COMPLETE 03/17/2023  Narrative ECHOCARDIOGRAM REPORT    Patient Name:   Harold Waters Date of Exam: 03/17/2023 Medical Rec #:  [id number]            Height:       65.0 in Accession #:    [id number]           Weight:       170.0 lb Date of Birth:  [birth year]             BSA:          1.846 m Patient Age:    77 years             BP:           110/58 mmHg Patient Gender: M                    HR:           65 bpm. Exam Location:  [address]  Procedure: 2D Echo, 3D Echo, Cardiac Doppler and Color Doppler  Indications:    I35.1 Aortic Insufficiency I25.10 CAD  History:        Patient has prior history of Echocardiogram examinations, most recent 08/19/2018. CAD and Previous Myocardial Infarction, PAD, Aortic Valve Disease, Arrythmias:PSVT, Signs/Symptoms:Shortness of Breath; Risk Factors:Sleep Apnea, Former Smoker, Dyslipidemia, Hypertension and Family History of Coronary Artery Disease. DVT.  Sonographer:    Farrel Conners RDCS Referring Phys: [facility] [employer] [facility] A Aalyah Mansouri  IMPRESSIONS   1. Left ventricular ejection fraction, by estimation, is 60 to 65%. The left  ventricle has normal function. The left ventricle has no regional wall motion abnormalities. Left ventricular diastolic parameters are consistent with Grade I diastolic dysfunction (impaired relaxation). 2. Right ventricular systolic function is normal. The right ventricular size is normal. There is normal pulmonary artery systolic pressure. The estimated right ventricular systolic pressure is 18.1 mmHg. 3. The mitral valve is grossly normal. Mild mitral valve regurgitation. 4. The aortic valve is tricuspid. There is moderate calcification of the aortic valve. There is moderate thickening of the aortic valve. Aortic valve regurgitation is mild to moderate. Mild aortic valve stenosis. Aortic valve area, by VTI measures 1.87 cm. Aortic valve mean gradient measures 10.0 mmHg. Aortic valve Vmax measures 2.14 m/s. 5. The inferior vena cava is normal in size with greater than 50% respiratory variability,  Cardiology Office Note:  .    Date:  07/19/2023  ID:  Harold Waters, DOB [birth year], MRN [id number] PCP: Tresa Garter, MD  [facility] Providers Cardiologist:  Tonny Bollman, MD     CC: DOD- follow up stress testing  History of Present Illness: Harold Waters    Harold Waters is a 77 y.o. male with a history of coronary artery disease and peripheral arterial disease, presents for evaluation after a stress test. He had a peripheral arterial disease intervention in May.  He had chest pain and a cardiac PET myocardial perfusion imaging test. His nephrologist was trying to wean him off blood pressure medications due to symptomatic hypotension. He had lower extremity swelling and was experiencing chest pain that radiated to his arm, which led to a stress test. Since starting Imdur 30 mg this has resolved.  Patient notes that he is doing much better.   Since last visit notes notes that he is getting worked up for worsening memory since he returned for [place] .  No chest pain or pressure .  Notes SOB and orthopnea.  Has SOB with 9/11 PET MPI study after Lexiscan. Notes orthopnea.  Stable leg swelling.  No palpitations or syncope.  Relevant histories: .  Social: comes with daughter, he is from [place] ROS: As per HPI.   Studies Reviewed: Harold Waters    DIAGNOSTIC Echocardiogram: Mild to moderate aortic insufficiency, mild aortic stenosis, preserved left ventricular function Stress test: Multiple areas of reversible ischemia, regional defect in circumflex and RCA (07/14/2023)  Cardiac Studies & Procedures   CARDIAC CATHETERIZATION  CARDIAC CATHETERIZATION 09/19/2018  Narrative Images from the original result were not included.   Prox RCA to Mid RCA lesion is 90% stenosed.  Ost 2nd Mrg to 2nd Mrg lesion is 99% stenosed.  Prox LAD lesion is 60% stenosed.  Ost 1st Diag lesion is 50% stenosed.   Harold Waters is a 77 y.o. male   [id number] LOCATION:  FACILITY:  [facility] PHYSICIAN: Nanetta Batty, M.D. [birth year]   DATE OF PROCEDURE:  09/19/2018  DATE OF DISCHARGE:     CARDIAC CATHETERIZATION    History obtained from chart review.Harold Waters is a 77 y.o.  thin-appearing married Latino male father of 4 children, grandfather and grandchildren, referred by Dr. Excell Seltzer for peripheral vascular evaluation. I last saw him in the office 05/07/2017. He has a history of treated hyperlipidemia. He has had a remote stroke and has had intervention on his nondominant right answer complex coronary arteries in the past. He stopped smoking 8 years ago but did smoke a pack a day for 40 years. He had a stent placed in his left SFA November 2011 by Dr. Excell Seltzer which resulted in improvement in his claudication. Over the last several months he's had reappearance of his claudication which is now lifestyle limiting. Dopplers performed 03/22/17 show a decline in his ABI from 0.96-2.71 with what appears to be an occluded left SFA stent.  I perform peripheral angiography and endovascular intervention on him 04/22/17. He had an occluded left SFA stent which I opened up and placed a Viabahn  covered stent with an excellent result. He did have 3 vessel. His claudication has completely resolved.  Since I saw him a year ago he has developed some recurrent left calf claudication.  He had lower extremity Dopplers performed 11/24/2017 that revealed normal ABIs bilaterally with a patent stent.  In addition, he has noticed some increasing exertional chest pain and shortness of breath.  the left circumflex territory (1.42), but is also mildly reduced in the right coronary artery territory   Coronary calcium assessment not performed due to prior revascularization.   Electronically signed by: Thurmon Fair, MD  EXAM: OVER-READ INTERPRETATION  CT CHEST  The following report is a limited chest CT over-read performed by radiologist Dr. Irma Newness [facility] on 07/14/2023. This over-read does not include interpretation of cardiac or coronary anatomy or pathology nor does it include evaluation  of the PET data. The cardiac PET-CT interpretation by the cardiologist is attached.  COMPARISON:  Chest radiographs 10/15/2022.  Abdominal CT 02/23/2008  FINDINGS: Mediastinum/Nodes: No enlarged lymph nodes within the visualized mediastinum.Aortic atherosclerosis and aortic valvular calcifications are noted.  Lungs/Pleura: There is no pleural effusion. Possible mild underlying central airway thickening. The visualized lungs are otherwise clear.  Upper abdomen: No significant findings in the visualized upper abdomen.  Musculoskeletal/Chest wall: No chest wall mass or suspicious osseous findings within the visualized chest.  IMPRESSION: No significant extracardiac findings within the visualized chest.   Electronically Signed By: Harold Waters M.D. On: 07/14/2023 11:38   ECHOCARDIOGRAM  ECHOCARDIOGRAM COMPLETE 03/17/2023  Narrative ECHOCARDIOGRAM REPORT    Patient Name:   Harold Waters Date of Exam: 03/17/2023 Medical Rec #:  [id number]            Height:       65.0 in Accession #:    [id number]           Weight:       170.0 lb Date of Birth:  [birth year]             BSA:          1.846 m Patient Age:    77 years             BP:           110/58 mmHg Patient Gender: M                    HR:           65 bpm. Exam Location:  [address]  Procedure: 2D Echo, 3D Echo, Cardiac Doppler and Color Doppler  Indications:    I35.1 Aortic Insufficiency I25.10 CAD  History:        Patient has prior history of Echocardiogram examinations, most recent 08/19/2018. CAD and Previous Myocardial Infarction, PAD, Aortic Valve Disease, Arrythmias:PSVT, Signs/Symptoms:Shortness of Breath; Risk Factors:Sleep Apnea, Former Smoker, Dyslipidemia, Hypertension and Family History of Coronary Artery Disease. DVT.  Sonographer:    Farrel Conners RDCS Referring Phys: [facility] [employer] A Aalyah Mansouri  IMPRESSIONS   1. Left ventricular ejection fraction, by estimation, is 60 to 65%. The left  ventricle has normal function. The left ventricle has no regional wall motion abnormalities. Left ventricular diastolic parameters are consistent with Grade I diastolic dysfunction (impaired relaxation). 2. Right ventricular systolic function is normal. The right ventricular size is normal. There is normal pulmonary artery systolic pressure. The estimated right ventricular systolic pressure is 18.1 mmHg. 3. The mitral valve is grossly normal. Mild mitral valve regurgitation. 4. The aortic valve is tricuspid. There is moderate calcification of the aortic valve. There is moderate thickening of the aortic valve. Aortic valve regurgitation is mild to moderate. Mild aortic valve stenosis. Aortic valve area, by VTI measures 1.87 cm. Aortic valve mean gradient measures 10.0 mmHg. Aortic valve Vmax measures 2.14 m/s. 5. The inferior vena cava is normal in size with greater than 50% respiratory variability,  the left circumflex territory (1.42), but is also mildly reduced in the right coronary artery territory   Coronary calcium assessment not performed due to prior revascularization.   Electronically signed by: Thurmon Fair, MD  EXAM: OVER-READ INTERPRETATION  CT CHEST  The following report is a limited chest CT over-read performed by radiologist Dr. Irma Newness [facility] on 07/14/2023. This over-read does not include interpretation of cardiac or coronary anatomy or pathology nor does it include evaluation  of the PET data. The cardiac PET-CT interpretation by the cardiologist is attached.  COMPARISON:  Chest radiographs 10/15/2022.  Abdominal CT 02/23/2008  FINDINGS: Mediastinum/Nodes: No enlarged lymph nodes within the visualized mediastinum.Aortic atherosclerosis and aortic valvular calcifications are noted.  Lungs/Pleura: There is no pleural effusion. Possible mild underlying central airway thickening. The visualized lungs are otherwise clear.  Upper abdomen: No significant findings in the visualized upper abdomen.  Musculoskeletal/Chest wall: No chest wall mass or suspicious osseous findings within the visualized chest.  IMPRESSION: No significant extracardiac findings within the visualized chest.   Electronically Signed By: Harold Waters M.D. On: 07/14/2023 11:38   ECHOCARDIOGRAM  ECHOCARDIOGRAM COMPLETE 03/17/2023  Narrative ECHOCARDIOGRAM REPORT    Patient Name:   Harold Waters Date of Exam: 03/17/2023 Medical Rec #:  [id number]            Height:       65.0 in Accession #:    [id number]           Weight:       170.0 lb Date of Birth:  [birth year]             BSA:          1.846 m Patient Age:    77 years             BP:           110/58 mmHg Patient Gender: M                    HR:           65 bpm. Exam Location:  [address]  Procedure: 2D Echo, 3D Echo, Cardiac Doppler and Color Doppler  Indications:    I35.1 Aortic Insufficiency I25.10 CAD  History:        Patient has prior history of Echocardiogram examinations, most recent 08/19/2018. CAD and Previous Myocardial Infarction, PAD, Aortic Valve Disease, Arrythmias:PSVT, Signs/Symptoms:Shortness of Breath; Risk Factors:Sleep Apnea, Former Smoker, Dyslipidemia, Hypertension and Family History of Coronary Artery Disease. DVT.  Sonographer:    Farrel Conners RDCS Referring Phys: [facility] [employer] A Aalyah Mansouri  IMPRESSIONS   1. Left ventricular ejection fraction, by estimation, is 60 to 65%. The left  ventricle has normal function. The left ventricle has no regional wall motion abnormalities. Left ventricular diastolic parameters are consistent with Grade I diastolic dysfunction (impaired relaxation). 2. Right ventricular systolic function is normal. The right ventricular size is normal. There is normal pulmonary artery systolic pressure. The estimated right ventricular systolic pressure is 18.1 mmHg. 3. The mitral valve is grossly normal. Mild mitral valve regurgitation. 4. The aortic valve is tricuspid. There is moderate calcification of the aortic valve. There is moderate thickening of the aortic valve. Aortic valve regurgitation is mild to moderate. Mild aortic valve stenosis. Aortic valve area, by VTI measures 1.87 cm. Aortic valve mean gradient measures 10.0 mmHg. Aortic valve Vmax measures 2.14 m/s. 5. The inferior vena cava is normal in size with greater than 50% respiratory variability,  Cardiology Office Note:  .    Date:  07/19/2023  ID:  Harold Waters, DOB [birth year], MRN [id number] PCP: Tresa Garter, MD  [facility] Providers Cardiologist:  Tonny Bollman, MD     CC: DOD- follow up stress testing  History of Present Illness: Harold Waters    Harold Waters is a 77 y.o. male with a history of coronary artery disease and peripheral arterial disease, presents for evaluation after a stress test. He had a peripheral arterial disease intervention in May.  He had chest pain and a cardiac PET myocardial perfusion imaging test. His nephrologist was trying to wean him off blood pressure medications due to symptomatic hypotension. He had lower extremity swelling and was experiencing chest pain that radiated to his arm, which led to a stress test. Since starting Imdur 30 mg this has resolved.  Patient notes that he is doing much better.   Since last visit notes notes that he is getting worked up for worsening memory since he returned for [place] .  No chest pain or pressure .  Notes SOB and orthopnea.  Has SOB with 9/11 PET MPI study after Lexiscan. Notes orthopnea.  Stable leg swelling.  No palpitations or syncope.  Relevant histories: .  Social: comes with daughter, he is from [place] ROS: As per HPI.   Studies Reviewed: Harold Waters    DIAGNOSTIC Echocardiogram: Mild to moderate aortic insufficiency, mild aortic stenosis, preserved left ventricular function Stress test: Multiple areas of reversible ischemia, regional defect in circumflex and RCA (07/14/2023)  Cardiac Studies & Procedures   CARDIAC CATHETERIZATION  CARDIAC CATHETERIZATION 09/19/2018  Narrative Images from the original result were not included.   Prox RCA to Mid RCA lesion is 90% stenosed.  Ost 2nd Mrg to 2nd Mrg lesion is 99% stenosed.  Prox LAD lesion is 60% stenosed.  Ost 1st Diag lesion is 50% stenosed.   Harold Waters is a 77 y.o. male   [id number] LOCATION:  FACILITY:  [facility] PHYSICIAN: Nanetta Batty, M.D. [birth year]   DATE OF PROCEDURE:  09/19/2018  DATE OF DISCHARGE:     CARDIAC CATHETERIZATION    History obtained from chart review.Harold Waters is a 77 y.o.  thin-appearing married Latino male father of 4 children, grandfather and grandchildren, referred by Dr. Excell Seltzer for peripheral vascular evaluation. I last saw him in the office 05/07/2017. He has a history of treated hyperlipidemia. He has had a remote stroke and has had intervention on his nondominant right answer complex coronary arteries in the past. He stopped smoking 8 years ago but did smoke a pack a day for 40 years. He had a stent placed in his left SFA November 2011 by Dr. Excell Seltzer which resulted in improvement in his claudication. Over the last several months he's had reappearance of his claudication which is now lifestyle limiting. Dopplers performed 03/22/17 show a decline in his ABI from 0.96-2.71 with what appears to be an occluded left SFA stent.  I perform peripheral angiography and endovascular intervention on him 04/22/17. He had an occluded left SFA stent which I opened up and placed a Viabahn  covered stent with an excellent result. He did have 3 vessel. His claudication has completely resolved.  Since I saw him a year ago he has developed some recurrent left calf claudication.  He had lower extremity Dopplers performed 11/24/2017 that revealed normal ABIs bilaterally with a patent stent.  In addition, he has noticed some increasing exertional chest pain and shortness of breath.

## 2023-07-19 NOTE — Patient Instructions (Signed)
Medication Instructions:  Your physician recommends that you continue on your current medications as directed. Please refer to the Current Medication list given to you today.  *If you need a refill on your cardiac medications before your next appointment, please call your pharmacy*   Lab Work: BMP, BNP  If you have labs (blood work) drawn today and your tests are completely normal, you will receive your results only by: MyChart Message (if you have MyChart) OR A paper copy in the mail If you have any lab test that is abnormal or we need to change your treatment, we will call you to review the results.   Testing/Procedures: NONE   Follow-Up: At [facility], you and your health needs are our priority.  As part of our continuing mission to provide you with exceptional heart care, we have created designated Provider Care Teams.  These Care Teams include your primary Cardiologist (physician) and Advanced Practice Providers (APPs -  Physician Assistants and Nurse Practitioners) who all work together to provide you with the care you need, when you need it.  Your next appointment:   5-7 month(s)  Provider:   Riley Lam, MD

## 2023-07-20 LAB — BASIC METABOLIC PANEL
BUN/Creatinine Ratio: 14 (ref 10–24)
BUN: 29 mg/dL — ABNORMAL HIGH (ref 8–27)
CO2: 22 mmol/L (ref 20–29)
Calcium: 9.6 mg/dL (ref 8.6–10.2)
Chloride: 105 mmol/L (ref 96–106)
Creatinine, Ser: 2.07 mg/dL — ABNORMAL HIGH (ref 0.76–1.27)
Glucose: 97 mg/dL (ref 70–99)
Potassium: 4.7 mmol/L (ref 3.5–5.2)
Sodium: 142 mmol/L (ref 134–144)
eGFR: 32 mL/min/{1.73_m2} — ABNORMAL LOW (ref >59)

## 2023-07-20 LAB — PRO B NATRIURETIC PEPTIDE: NT-Pro BNP: 141 pg/mL (ref 0–486)

## 2023-07-22 ENCOUNTER — Telehealth: Payer: Self-pay

## 2023-07-22 DIAGNOSIS — I25110 Atherosclerotic heart disease of native coronary artery with unstable angina pectoris: Secondary | ICD-10-CM

## 2023-07-22 MED ORDER — EMPAGLIFLOZIN 10 MG PO TABS: 10.0000 mg | ORAL_TABLET | Freq: Every day | ORAL | 0 refills | Status: DC

## 2023-07-22 MED ORDER — EMPAGLIFLOZIN 10 MG PO TABS
10.0000 mg | ORAL_TABLET | Freq: Every day | ORAL | 3 refills | Status: DC
Start: 2023-07-22 — End: 2023-12-08

## 2023-07-22 NOTE — Telephone Encounter (Signed)
-----   Message from Christell Constant sent at 07/21/2023  7:50 AM EDT ----- Regarding: FW: GFR has improved. Normal BNP. Offer Jardiance 10 mg and Bmp for two weeks for kidney protection ----- Message ----- From: Interface, [facility] Lab Results In Sent: 07/20/2023   6:37 AM EDT To: Christell Constant, MD

## 2023-07-22 NOTE — Telephone Encounter (Signed)
The patient daughter has been notified of the result and verbalized understanding.  All questions (if any) were answered. Arvid Right Bellagrace Sylvan, RN 07/22/2023 12:33 PM    Pt provided with 28 days of Jardiance 10 mg samples. LOT #[id number] EXP March 2026- 2 boxes of 7 count. LOT [id number] EXP Nov 2026-boxes of 7 count.    Also provided with 30 day free trial offer and information for pt assistance.  Left at front desk for daughter to pick up tomorrow.  Pt will have f/u labs on 08/09/23.

## 2023-07-23 ENCOUNTER — Encounter: Payer: Self-pay | Admitting: Pharmacist

## 2023-07-23 NOTE — Progress Notes (Signed)
Patient ID: Harold Waters, male   DOB: 04/03/1946, 77 y.o.   MRN: [id number]  Pharmacy Quality Measure Review  This patient is appearing on a report for being at risk of failing the adherence measure for hypertension (ACEi/ARB) medications this calendar year.   Medication: Olmesartan 5 mg daily Last fill date: 01/18/2023 for 90 day supply  Olmesartan was increased to 40 mg 04/27/23. Pt filled a 90 DS 06/07/2023. on Insurance report was not up to date. No action needed at this time.   Arbutus Leas, PharmD, BCPS [facility] [phone]

## 2023-07-30 ENCOUNTER — Encounter: Payer: Self-pay | Admitting: Physician Assistant

## 2023-07-30 DIAGNOSIS — R413 Other amnesia: Secondary | ICD-10-CM

## 2023-08-02 ENCOUNTER — Encounter: Payer: Self-pay | Admitting: Family Medicine

## 2023-08-02 ENCOUNTER — Ambulatory Visit: Payer: PPO

## 2023-08-02 ENCOUNTER — Ambulatory Visit (INDEPENDENT_AMBULATORY_CARE_PROVIDER_SITE_OTHER): Payer: PPO

## 2023-08-02 ENCOUNTER — Telehealth: Payer: Self-pay | Admitting: Internal Medicine

## 2023-08-02 ENCOUNTER — Ambulatory Visit (INDEPENDENT_AMBULATORY_CARE_PROVIDER_SITE_OTHER): Payer: PPO | Admitting: Family Medicine

## 2023-08-02 VITALS — BP 118/60 | HR 75 | Temp 98.2°F | Resp 20 | Ht 65.0 in | Wt 162.0 lb

## 2023-08-02 DIAGNOSIS — R0689 Other abnormalities of breathing: Secondary | ICD-10-CM

## 2023-08-02 DIAGNOSIS — J0110 Acute frontal sinusitis, unspecified: Secondary | ICD-10-CM

## 2023-08-02 DIAGNOSIS — R6889 Other general symptoms and signs: Secondary | ICD-10-CM | POA: Diagnosis not present

## 2023-08-02 DIAGNOSIS — R069 Unspecified abnormalities of breathing: Secondary | ICD-10-CM | POA: Diagnosis not present

## 2023-08-02 DIAGNOSIS — R918 Other nonspecific abnormal finding of lung field: Secondary | ICD-10-CM | POA: Diagnosis not present

## 2023-08-02 LAB — POCT INFLUENZA A/B
Influenza A, POC: NEGATIVE
Influenza B, POC: NEGATIVE

## 2023-08-02 MED ORDER — AMOXICILLIN-POT CLAVULANATE 875-125 MG PO TABS
1.0000 | ORAL_TABLET | Freq: Two times a day (BID) | ORAL | 0 refills | Status: AC
Start: 2023-08-02 — End: 2023-08-09

## 2023-08-02 NOTE — Telephone Encounter (Signed)
Pt daughter called wanting to know if Deliah Boston will send that medication in before 5 because her dad is unable to get proper rest and night. Please advise.

## 2023-08-02 NOTE — Patient Instructions (Signed)
Continue Tylenol Cold & Flu + Robitussin or Delsym

## 2023-08-02 NOTE — Progress Notes (Signed)
Assessment & Plan:  1. Acute non-recurrent frontal sinusitis Education provided on sinus infections. Encouraged symptom management, continuing Tylenol Cold & Flu with Robitussin or Delsym.  2. Abnormal breath sounds - DG Chest 2 View; Future  3. Flu-like symptoms - POCT Influenza A/B  Results for orders placed or performed in visit on 08/02/23  POCT Influenza A/B  Result Value Ref Range   Influenza A, POC Negative Negative   Influenza B, POC Negative Negative    Follow up plan: Return if symptoms worsen or fail to improve.  Deliah Boston, MSN, APRN, FNP-C  Subjective:  HPI: Harold Waters is a 77 y.o. male presenting on 08/02/2023 for URI (Cough, congestion, decreased sleep, body aches, SOB, no fever, ST, HA /Started Thursday  - Covid home test NEG )  Patient is accompanied by his daughter, who he is okay with being present.  Patient complains of cough, head congestion, headache, sore throat, shortness of breath, extreme weakness, decreased sleep and severe body aches. He denies fever. Onset of symptoms was 4 days ago, unchanged since that time. He is drinking plenty of fluids. Evaluation to date: at home COVID test negative. Treatment to date:  Tylenol Cold & Flu, Robitussin DM .  He does not smoke.    ROS: Negative unless specifically indicated above in HPI.   Relevant past medical history reviewed and updated as indicated.   Allergies and medications reviewed and updated.   Current Outpatient Medications:    chlorthalidone (HYGROTON) 25 MG tablet, Take 25 mg by mouth daily., Disp: , Rfl:    clopidogrel (PLAVIX) 75 MG tablet, Take 1 tablet (75 mg total) by mouth daily., Disp: 30 tablet, Rfl: 11   ezetimibe (ZETIA) 10 MG tablet, Take 1 tablet (10 mg total) by mouth daily., Disp: 90 tablet, Rfl: 3   glucose blood (ONETOUCH VERIO) test strip, Use to check blood sugar daily, Disp: 50 each, Rfl: 11   isosorbide mononitrate (IMDUR) 30 MG 24 hr tablet, Take 1 tablet (30  mg total) by mouth daily., Disp: 90 tablet, Rfl: 3   memantine (NAMENDA XR) 21 MG CP24 24 hr capsule, Take 1 capsule (21 mg total) by mouth at bedtime., Disp: 30 capsule, Rfl: 11   olmesartan (BENICAR) 40 MG tablet, Take 40 mg by mouth daily., Disp: , Rfl:    polyethylene glycol powder (GLYCOLAX/MIRALAX) 17 GM/SCOOP powder, Take 17 g by mouth 2 (two) times daily as needed for moderate constipation., Disp: 500 g, Rfl: 3   rosuvastatin (CRESTOR) 20 MG tablet, Take 1 tablet (20 mg total) by mouth daily., Disp: 90 tablet, Rfl: 3   traZODone (DESYREL) 50 MG tablet, Take 25 mg by mouth at bedtime., Disp: , Rfl:    empagliflozin (JARDIANCE) 10 MG TABS tablet, Take 1 tablet (10 mg total) by mouth daily before breakfast. (Patient not taking: Reported on 08/02/2023), Disp: 90 tablet, Rfl: 3   nitroGLYCERIN (NITROSTAT) 0.4 MG SL tablet, Place 1 tablet under tongue every 5 mins. DO NOT USE MORE THAN 3 TABS (Patient not taking: Reported on 08/02/2023), Disp: 25 tablet, Rfl: 2   sertraline (ZOLOFT) 25 MG tablet, Take 1 tablet (25 mg total) by mouth daily. (Patient not taking: Reported on 08/02/2023), Disp: 30 tablet, Rfl: 11  Allergies  Allergen Reactions   Egg-Derived Products Itching   Aricept [Donepezil] Nausea And Vomiting   Coreg [Carvedilol] Other (See Comments)    Fatigue    Objective:   BP 118/60   Pulse 75   Temp 98.2  F (36.8 C)   Resp 20   Ht 5\' 5"  (1.651 m)   Wt 162 lb (73.5 kg)   SpO2 93%   BMI 26.96 kg/m    Physical Exam Vitals reviewed.  Constitutional:      General: He is not in acute distress.    Appearance: Normal appearance. He is not ill-appearing, toxic-appearing or diaphoretic.  HENT:     Head: Normocephalic and atraumatic.     Right Ear: Tympanic membrane, ear canal and external ear normal. There is no impacted cerumen.     Left Ear: Tympanic membrane, ear canal and external ear normal. There is no impacted cerumen.     Nose: Nose normal.     Right Sinus: No maxillary  sinus tenderness or frontal sinus tenderness.     Left Sinus: No maxillary sinus tenderness or frontal sinus tenderness.     Mouth/Throat:     Mouth: Mucous membranes are moist.     Pharynx: Oropharynx is clear. No oropharyngeal exudate or posterior oropharyngeal erythema.     Tonsils: No tonsillar exudate or tonsillar abscesses.  Eyes:     General: No scleral icterus.       Right eye: No discharge.        Left eye: No discharge.     Conjunctiva/sclera: Conjunctivae normal.  Cardiovascular:     Rate and Rhythm: Normal rate.  Pulmonary:     Effort: Pulmonary effort is normal. No respiratory distress.     Breath sounds: Examination of the right-lower field reveals wheezing. Examination of the left-lower field reveals wheezing and rales. Wheezing and rales present.  Musculoskeletal:        General: Normal range of motion.     Cervical back: Normal range of motion.  Lymphadenopathy:     Cervical: No cervical adenopathy.  Skin:    General: Skin is warm and dry.  Neurological:     Mental Status: He is alert and oriented to person, place, and time. Mental status is at baseline.  Psychiatric:        Mood and Affect: Mood normal.        Behavior: Behavior normal.        Thought Content: Thought content normal.        Judgment: Judgment normal.

## 2023-08-02 NOTE — Telephone Encounter (Signed)
Done

## 2023-08-03 MED ORDER — ALBUTEROL SULFATE HFA 108 (90 BASE) MCG/ACT IN AERS
2.0000 | INHALATION_SPRAY | RESPIRATORY_TRACT | 0 refills | Status: DC | PRN
Start: 2023-08-03 — End: 2024-03-17

## 2023-08-06 ENCOUNTER — Telehealth: Payer: Self-pay | Admitting: Internal Medicine

## 2023-08-06 NOTE — Telephone Encounter (Signed)
Called daughter advised medication samples should still be at the front desk.  Reports her, mom and dad (pt) were all really sick.  Tested negative for COVID and flu doesn't know what they had but reports it was really bad.   Daughter reports will come by on Monday to pick up samples.

## 2023-08-06 NOTE — Telephone Encounter (Signed)
Patient calling the office for samples of medication:   1.  What medication and dosage are you requesting samples for?  empagliflozin (JARDIANCE) 10 MG TABS tablet   Take 1 tablet (10 mg total) by mouth daily before breakfast.Patient not taking: Reported on 08/02/2023   2.  Are you currently out of this medication? Yes  Patient's daughter called stating they were supposed to pick up samples, but had gotten sick. Patient's daughter would like to know if the samples are still at the office waiting to pick up. Please advise.

## 2023-08-09 ENCOUNTER — Ambulatory Visit: Payer: PPO

## 2023-08-12 ENCOUNTER — Other Ambulatory Visit: Payer: Self-pay | Admitting: Physician Assistant

## 2023-08-12 ENCOUNTER — Ambulatory Visit: Payer: PPO | Admitting: Physician Assistant

## 2023-08-23 ENCOUNTER — Ambulatory Visit: Payer: PPO | Attending: Internal Medicine

## 2023-08-24 DIAGNOSIS — N1832 Chronic kidney disease, stage 3b: Secondary | ICD-10-CM | POA: Diagnosis not present

## 2023-08-24 DIAGNOSIS — E663 Overweight: Secondary | ICD-10-CM | POA: Diagnosis not present

## 2023-08-24 DIAGNOSIS — G4700 Insomnia, unspecified: Secondary | ICD-10-CM | POA: Diagnosis not present

## 2023-08-24 DIAGNOSIS — I252 Old myocardial infarction: Secondary | ICD-10-CM | POA: Diagnosis not present

## 2023-08-24 DIAGNOSIS — I2510 Atherosclerotic heart disease of native coronary artery without angina pectoris: Secondary | ICD-10-CM | POA: Diagnosis not present

## 2023-08-24 DIAGNOSIS — I130 Hypertensive heart and chronic kidney disease with heart failure and stage 1 through stage 4 chronic kidney disease, or unspecified chronic kidney disease: Secondary | ICD-10-CM | POA: Diagnosis not present

## 2023-08-24 DIAGNOSIS — I70213 Atherosclerosis of native arteries of extremities with intermittent claudication, bilateral legs: Secondary | ICD-10-CM | POA: Diagnosis not present

## 2023-08-24 DIAGNOSIS — I10 Essential (primary) hypertension: Secondary | ICD-10-CM | POA: Diagnosis not present

## 2023-08-24 DIAGNOSIS — I739 Peripheral vascular disease, unspecified: Secondary | ICD-10-CM | POA: Diagnosis not present

## 2023-08-24 DIAGNOSIS — G8929 Other chronic pain: Secondary | ICD-10-CM | POA: Diagnosis not present

## 2023-08-24 DIAGNOSIS — E785 Hyperlipidemia, unspecified: Secondary | ICD-10-CM | POA: Diagnosis not present

## 2023-08-24 DIAGNOSIS — F0150 Vascular dementia without behavioral disturbance: Secondary | ICD-10-CM | POA: Diagnosis not present

## 2023-08-26 ENCOUNTER — Encounter: Payer: Self-pay | Admitting: Internal Medicine

## 2023-08-26 ENCOUNTER — Ambulatory Visit: Payer: PPO | Admitting: Internal Medicine

## 2023-08-30 DIAGNOSIS — C61 Malignant neoplasm of prostate: Secondary | ICD-10-CM | POA: Diagnosis not present

## 2023-08-30 DIAGNOSIS — Z8546 Personal history of malignant neoplasm of prostate: Secondary | ICD-10-CM | POA: Diagnosis not present

## 2023-09-06 DIAGNOSIS — Z8546 Personal history of malignant neoplasm of prostate: Secondary | ICD-10-CM | POA: Diagnosis not present

## 2023-09-09 ENCOUNTER — Other Ambulatory Visit: Payer: PPO

## 2023-09-13 ENCOUNTER — Telehealth: Payer: Self-pay

## 2023-09-13 NOTE — Telephone Encounter (Signed)
Pharmacy Quality Measure Review  This patient is appearing on a report for being at risk of failing the adherence measure for hypertension (ACEi/ARB) medications this calendar year.   Medication: Olmesartan 5 mg daily  Last fill date: 09/09/2023 for 90 day supply  Insurance report was not up to date. No action needed at this time.    Roslyn Smiling, PharmD PGY1 Pharmacy Resident 09/13/2023 2:35 PM

## 2023-09-17 ENCOUNTER — Ambulatory Visit (INDEPENDENT_AMBULATORY_CARE_PROVIDER_SITE_OTHER): Payer: PPO

## 2023-09-17 ENCOUNTER — Encounter: Payer: Self-pay | Admitting: Internal Medicine

## 2023-09-17 DIAGNOSIS — Z0000 Encounter for general adult medical examination without abnormal findings: Secondary | ICD-10-CM | POA: Diagnosis not present

## 2023-09-17 NOTE — Progress Notes (Cosign Needed Addendum)
Subjective:   Harold Waters is a 77 y.o. male who presents for Medicare Annual/Subsequent preventive examination.  Visit Complete: Virtual I connected with  [name] on 09/17/23 by a audio enabled telemedicine application and verified that I am speaking with the correct person using two identifiers. Daughter Harold Waters was also on call.  Patient Location: Home  Provider Location: Office/Clinic  I discussed the limitations of evaluation and management by telemedicine. The patient expressed understanding and agreed to proceed.  Vital Signs: Because this visit was a virtual/telehealth visit, some criteria may be missing or patient reported. Any vitals not documented were not able to be obtained and vitals that have been documented are patient reported.    Cardiac Risk Factors include: advanced age (>61men, >66 women);diabetes mellitus;dyslipidemia;hypertension;male gender     Objective:    Today's Vitals   There is no height or weight on file to calculate BMI.     09/17/2023    9:34 AM 07/19/2023    9:38 AM 05/12/2023    9:22 AM 03/22/2023    9:08 AM 11/06/2022    9:34 AM 06/08/2022   10:29 AM 05/04/2022    9:58 AM  Advanced Directives  Does Patient Have a Medical Advance Directive? No No No No No No No  Would patient like information on creating a medical advance directive?  No - Patient declined No - Patient declined Yes (Inpatient - patient requests chaplain consult to create a medical advance directive)  No - Patient declined     Current Medications (verified) Outpatient Encounter Medications as of 09/17/2023  Medication Sig   albuterol (VENTOLIN HFA) 108 (90 Base) MCG/ACT inhaler Inhale 2 puffs into the lungs every 4 (four) hours as needed for wheezing or shortness of breath.   chlorthalidone (HYGROTON) 25 MG tablet Take 25 mg by mouth daily.   clopidogrel (PLAVIX) 75 MG tablet Take 1 tablet (75 mg total) by mouth daily.   ezetimibe (ZETIA) 10 MG tablet Take 1  tablet (10 mg total) by mouth daily.   glucose blood (ONETOUCH VERIO) test strip Use to check blood sugar daily   memantine (NAMENDA XR) 21 MG CP24 24 hr capsule TAKE 1 CAPSULE (21 MG TOTAL) BY MOUTH AT BEDTIME.   olmesartan (BENICAR) 40 MG tablet Take 40 mg by mouth daily.   polyethylene glycol powder (GLYCOLAX/MIRALAX) 17 GM/SCOOP powder Take 17 g by mouth 2 (two) times daily as needed for moderate constipation.   traZODone (DESYREL) 50 MG tablet Take 25 mg by mouth at bedtime.   empagliflozin (JARDIANCE) 10 MG TABS tablet Take 1 tablet (10 mg total) by mouth daily before breakfast. (Patient not taking: Reported on 08/02/2023)   isosorbide mononitrate (IMDUR) 30 MG 24 hr tablet Take 1 tablet (30 mg total) by mouth daily.   nitroGLYCERIN (NITROSTAT) 0.4 MG SL tablet Place 1 tablet under tongue every 5 mins. DO NOT USE MORE THAN 3 TABS (Patient not taking: Reported on 08/02/2023)   rosuvastatin (CRESTOR) 20 MG tablet Take 1 tablet (20 mg total) by mouth daily.   sertraline (ZOLOFT) 25 MG tablet Take 1 tablet (25 mg total) by mouth daily. (Patient not taking: Reported on 08/02/2023)   No facility-administered encounter medications on file as of 09/17/2023.    Allergies (verified) Egg-derived products, Aricept [donepezil], and Coreg [carvedilol]   History: Past Medical History:  Diagnosis Date   Angina    Chronic stomach ulcer    "get them off and on" (09/19/2018)   Coronary atherosclerosis of  unspecified type of vessel, native or graft    Diverticulosis    DVT of lower extremity (deep venous thrombosis) (HCC) ~ 2010   LLE   Erosive gastritis    Essential hypertension, benign    GERD (gastroesophageal reflux disease)    Headache(784.0) 02/18/12   "lately"   History of kidney stones    Internal hemorrhoids    Lower back pain    Myocardial infarction [facility]) 1997   Osteoarthritis    Other and unspecified hyperlipidemia    PAD (peripheral artery disease) (HCC)    with ABI's 0.8 on the  right and 0.86 on the left   Pneumonia 1957   PSVT (paroxysmal supraventricular tachycardia) (HCC) 02/18/12   Shortness of breath    "lying down"   Sleep apnea    does not wear c-pap; pt does not recall this hx on 09/19/2018   Past Surgical History:  Procedure Laterality Date   ABDOMINAL AORTOGRAM W/LOWER EXTREMITY N/A 03/22/2023   Procedure: ABDOMINAL AORTOGRAM W/LOWER EXTREMITY;  Surgeon: Maeola Harman, MD;  Location: [facility] INVASIVE CV LAB;  Service: Cardiovascular;  Laterality: N/A;   ARTHROSCOPY KNEE W/ DRILLING Right ~ 2001   BYPASS GRAFT Left 12/05/2018   femoral popliteal    CARDIAC CATHETERIZATION  09/19/2018   CATARACT EXTRACTION     COLONOSCOPY     CORONARY ANGIOPLASTY WITH STENT PLACEMENT  1997   "2"   CORONARY PRESSURE/FFR STUDY  09/19/2018   CORONARY PRESSURE/FFR STUDY N/A 09/19/2018   Procedure: INTRAVASCULAR PRESSURE WIRE/FFR STUDY;  Surgeon: Runell Gess, MD;  Location: [facility] INVASIVE CV LAB;  Service: Cardiovascular;  Laterality: N/A;   FEMORAL-POPLITEAL BYPASS GRAFT Left 12/06/2018   Procedure: BYPASS GRAFT FEMORAL-POPLITEAL ARTERY;  Surgeon: Maeola Harman, MD;  Location: [facility] OR;  Service: Vascular;  Laterality: Left;   ILIAC ARTERY STENT  04/22/2017   . Placement of a 6 mm x 100 mm Viabahn covered stent left SFA   LEFT HEART CATH AND CORONARY ANGIOGRAPHY N/A 09/19/2018   Procedure: LEFT HEART CATH AND CORONARY ANGIOGRAPHY;  Surgeon: Runell Gess, MD;  Location: [facility] INVASIVE CV LAB;  Service: Cardiovascular;  Laterality: N/A;   LEFT HEART CATHETERIZATION WITH CORONARY ANGIOGRAM N/A 02/19/2012   Procedure: LEFT HEART CATHETERIZATION WITH CORONARY ANGIOGRAM;  Surgeon: Laurey Morale, MD;  Location[facility] CATH LAB;  Service: Cardiovascular;  Laterality: N/A;   LOWER EXTREMITY ANGIOGRAPHY Bilateral 04/22/2017   Procedure: Lower Extremity Angiography;  Surgeon: Runell Gess, MD;  Location: [facility] INVASIVE CV LAB;  Service: Cardiovascular;   Laterality: Bilateral;   LOWER EXTREMITY ANGIOGRAPHY Bilateral 09/19/2018   LOWER EXTREMITY ANGIOGRAPHY Bilateral 09/19/2018   Procedure: LOWER EXTREMITY ANGIOGRAPHY;  Surgeon: Runell Gess, MD;  Location: [facility] INVASIVE CV LAB;  Service: Cardiovascular;  Laterality: Bilateral;   PERIPHERAL ARTERIAL STENT GRAFT  09/2010   LLE   PERIPHERAL VASCULAR ATHERECTOMY Left 04/22/2017   Procedure: Peripheral Vascular Atherectomy;  Surgeon: Runell Gess, MD;  Location: [facility] INVASIVE CV LAB;  Service: Cardiovascular;  Laterality: Left;  SFA   PERIPHERAL VASCULAR ATHERECTOMY  03/22/2023   Procedure: PERIPHERAL VASCULAR ATHERECTOMY;  Surgeon: Maeola Harman, MD;  Location: [facility] INVASIVE CV LAB;  Service: Cardiovascular;;   PERIPHERAL VASCULAR INTERVENTION Left 04/22/2017   Procedure: Peripheral Vascular Intervention;  Surgeon: Runell Gess, MD;  Location: [facility] INVASIVE CV LAB;  Service: Cardiovascular;  Laterality: Left;  SFA   PERIPHERAL VASCULAR INTERVENTION  03/22/2023   Procedure: PERIPHERAL VASCULAR INTERVENTION;  Surgeon: Maeola Harman, MD;  Location: [facility] INVASIVE CV LAB;  Service: Cardiovascular;;   UPPER GASTROINTESTINAL ENDOSCOPY     Family History  Problem Relation Age of Onset   Ulcers Father        had stomach issues, not sure what happened   Coronary artery disease Brother        male 1st degree relative <50   Colon cancer Neg Hx    Esophageal cancer Neg Hx    Rectal cancer Neg Hx    Stomach cancer Neg Hx    Social History   Socioeconomic History   Marital status: Married    Spouse name: Not on file   Number of children: 2   Years of education: 12   Highest education level: Not on file  Occupational History   Occupation: retired  Tobacco Use   Smoking status: Former    Current packs/day: 0.00    Average packs/day: 1 pack/day for 40.0 years (40.0 ttl pk-yrs)    Types: Cigarettes    Start date: 04/03/1970    Quit date: 04/03/2010    Years since quitting:  13.4    Passive exposure: Never   Smokeless tobacco: Never  Vaping Use   Vaping status: Never Used  Substance and Sexual Activity   Alcohol use: Not Currently    Comment: "used to drink when I was young; quit ~ 1980's"   Drug use: Never   Sexual activity: Yes  Other Topics Concern   Not on file  Social History Narrative   Right handed   Two story home   4 children   Wife lives with him in the home   Caffeine on occasion   Social Determinants of Health   Financial Resource Strain: Low Risk  (09/17/2023)   Overall Financial Resource Strain (CARDIA)    Difficulty of Paying Living Expenses: Not hard at all  Food Insecurity: No Food Insecurity (09/17/2023)   Hunger Vital Sign    Worried About Running Out of Food in the Last Year: Never true    Ran Out of Food in the Last Year: Never true  Transportation Needs: No Transportation Needs (09/17/2023)   PRAPARE - Administrator, Civil Service (Medical): No    Lack of Transportation (Non-Medical): No  Physical Activity: Sufficiently Active (09/17/2023)   Exercise Vital Sign    Days of Exercise per Week: 7 days    Minutes of Exercise per Session: 60 min  Stress: No Stress Concern Present (09/17/2023)   [employer] of Occupational Health - Occupational Stress Questionnaire    Feeling of Stress : Not at all  Social Connections: Moderately Isolated (09/17/2023)   Social Connection and Isolation Panel [NHANES]    Frequency of Communication with Friends and Family: More than three times a week    Frequency of Social Gatherings with Friends and Family: Twice a week    Attends Religious Services: Never    Database administrator or Organizations: No    Attends Engineer, structural: Not on file    Marital Status: Married    Tobacco Counseling Counseling given: Not Answered   Clinical Intake:  Pre-visit preparation completed: Yes  Pain : No/denies pain     Nutritional Risks: None Diabetes: Yes CBG  done?: No Did pt. bring in CBG monitor from home?: No  How often do you need to have someone help you when you read instructions, pamphlets, or other written materials from your doctor or pharmacy?: 1 - Never  Interpreter Needed?: No  Information entered by :: NAllen LPN   Activities of Daily Living    09/17/2023    9:27 AM  In your present state of health, do you have any difficulty performing the following activities:  Hearing? 0  Vision? 1  Comment at night time  Difficulty concentrating or making decisions? 1  Walking or climbing stairs? 1  Comment bad knee  Dressing or bathing? 0  Doing errands, shopping? 1  Preparing Food and eating ? N  Using the Toilet? N  In the past six months, have you accidently leaked urine? N  Do you have problems with loss of bowel control? N  Managing your Medications? Y  Managing your Finances? Y  Housekeeping or managing your Housekeeping? N    Patient Care Team: Plotnikov, Georgina Quint, MD as PCP - General (Internal Medicine) Tonny Bollman, MD as PCP - Cardiology (Cardiology) Jens Som Madolyn Frieze, MD as Consulting Physician (Cardiology) Maeola Harman, MD as Consulting Physician (Vascular Surgery) Tonny Bollman, MD as Consulting Physician (Cardiology) Van Clines, MD as Consulting Physician (Neurology) August Saucer Corrie Mckusick, MD as Consulting Physician (Orthopedic Surgery) Meryl Dare, MD as Consulting Physician (Gastroenterology) Szabat, Vinnie Level, [facility] (Inactive) as Pharmacist (Pharmacist) Margaretmary Dys, MD as Consulting Physician (Radiation Oncology)  Indicate any recent Medical Services you may have received from other than [facility] providers in the past year (date may be approximate).     Assessment:   This is a routine wellness examination for [place].  Hearing/Vision screen Hearing Screening - Comments:: Denies hearing issues Vision Screening - Comments:: No regular eye exams, [facility]   Goals Addressed              This Visit's Progress    Patient Stated       09/17/2023, denies goals       Depression Screen    09/17/2023    9:37 AM 08/02/2023    1:10 PM 01/11/2023    1:35 PM 06/08/2022   10:30 AM 06/08/2022   10:29 AM 06/08/2022   10:22 AM 06/01/2022    9:06 AM  PHQ 2/9 Scores  PHQ - 2 Score 0 1 0 0 0 0 0  PHQ- 9 Score 3  0    0    Fall Risk    09/17/2023    9:35 AM 07/19/2023    9:38 AM 05/12/2023    9:21 AM 01/11/2023    1:35 PM 11/06/2022    9:34 AM  Fall Risk   Falls in the past year? 0 0 0 0 0  Number falls in past yr: 0 0 0 0 0  Injury with Fall? 0 0 0 0 0  Risk for fall due to : Medication side effect   No Fall Risks   Follow up Falls prevention discussed;Falls evaluation completed Falls evaluation completed Falls evaluation completed Falls evaluation completed Falls evaluation completed    MEDICARE RISK AT HOME: Medicare Risk at Home Any stairs in or around the home?: Yes If so, are there any without handrails?: No Home free of loose throw rugs in walkways, pet beds, electrical cords, etc?: Yes Adequate lighting in your home to reduce risk of falls?: Yes Life alert?: No Use of a cane, walker or w/c?: No Grab bars in the bathroom?: No Shower chair or bench in shower?: No Elevated toilet seat or a handicapped toilet?: Yes  TIMED UP AND GO:  Was the test performed?  No    Cognitive Function:  6 CIT not administered. Patient  has diagnosis of dementia and is followed by neurology.      07/19/2023   11:00 AM 05/12/2023   12:00 PM 11/06/2022    6:00 PM 11/07/2021   10:00 AM 05/01/2021    1:00 PM  MMSE - Mini Mental State Exam  Orientation to time 5 5 5 5 5   Orientation to Place 4 5 5 5 5   Registration 3 3 3 3 3   Attention/ Calculation 0 5 5 5 5   Recall 3 3 1 3 3   Language- name 2 objects 2 2 2 2 2   Language- repeat 1 1 1 1 1   Language- follow 3 step command 3 3 3 3 3   Language- read & follow direction 1 1 1 1 1   Write a sentence 1 1 1 1 1   Copy design 0 1 1 0 1   Total score 23 30 28 29 30         06/08/2022   10:31 AM  6CIT Screen  What Year? 0 points  What month? 0 points  What time? 0 points  Count back from 20 0 points  Months in reverse 0 points  Repeat phrase 0 points  Total Score 0 points    Immunizations Immunization History  Administered Date(s) Administered   Fluad Quad(high Dose 65+) 09/05/2019   Hepatitis B 09/01/2007, 10/18/2007, 04/23/2008   Influenza Whole 08/20/2008   PPD Test 01/11/2023, 01/13/2023   Pneumococcal Conjugate-13 06/22/2016   Pneumococcal Polysaccharide-23 12/19/2014   Td 12/19/2014    TDAP status: Up to date  Flu Vaccine status: Due, Education has been provided regarding the importance of this vaccine. Advised may receive this vaccine at local pharmacy or Health Dept. Aware to provide a copy of the vaccination record if obtained from local pharmacy or Health Dept. Verbalized acceptance and understanding.  Pneumococcal vaccine status: Up to date  Covid-19 vaccine status: Information provided on how to obtain vaccines.   Qualifies for Shingles Vaccine? Yes   Zostavax completed No   Shingrix Completed?: No.    Education has been provided regarding the importance of this vaccine. Patient has been advised to call insurance company to determine out of pocket expense if they have not yet received this vaccine. Advised may also receive vaccine at local pharmacy or Health Dept. Verbalized acceptance and understanding.  Screening Tests Health Maintenance  Topic Date Due   OPHTHALMOLOGY EXAM  Never done   Hepatitis C Screening  Never done   Zoster Vaccines- Shingrix (1 of 2) Never done   Lung Cancer Screening  09/04/2004   HEMOGLOBIN A1C  12/02/2022   Diabetic kidney evaluation - Urine ACR  06/02/2023   FOOT EXAM  06/02/2023   INFLUENZA VACCINE  01/31/2024 (Originally 06/03/2023)   Diabetic kidney evaluation - eGFR measurement  07/18/2024   Medicare Annual Wellness (AWV)  09/16/2024   DTaP/Tdap/Td (2 -  Tdap) 12/19/2024   Pneumonia Vaccine 19+ Years old  Completed   HPV VACCINES  Aged Out   Colonoscopy  Discontinued   COVID-19 Vaccine  Discontinued    Health Maintenance  Health Maintenance Due  Topic Date Due   OPHTHALMOLOGY EXAM  Never done   Hepatitis C Screening  Never done   Zoster Vaccines- Shingrix (1 of 2) Never done   Lung Cancer Screening  09/04/2004   HEMOGLOBIN A1C  12/02/2022   Diabetic kidney evaluation - Urine ACR  06/02/2023   FOOT EXAM  06/02/2023    Colorectal cancer screening: No longer required.   Lung Cancer  Screening: (Low Dose CT Chest recommended if Age 26-80 years, 20 pack-year currently smoking OR have quit w/in 15years.) does not qualify.   Lung Cancer Screening Referral: no  Additional Screening:  Hepatitis C Screening: does qualify;  Vision Screening: Recommended annual ophthalmology exams for early detection of glaucoma and other disorders of the eye. Is the patient up to date with their annual eye exam?  No  Who is the provider or what is the name of the office in which the patient attends annual eye exams? [facility] If pt is not established with a provider, would they like to be referred to a provider to establish care? No .   Dental Screening: Recommended annual dental exams for proper oral hygiene  Diabetic Foot Exam: Diabetic Foot Exam: Overdue, Pt has been advised about the importance in completing this exam. Pt is scheduled for diabetic foot exam on next appointment.  Community Resource Referral / Chronic Care Management: CRR required this visit?  No   CCM required this visit?  No     Plan:     I have personally reviewed and noted the following in the patient's chart:   Medical and social history Use of alcohol, tobacco or illicit drugs  Current medications and supplements including opioid prescriptions. Patient is not currently taking opioid prescriptions. Functional ability and status Nutritional status Physical  activity Advanced directives List of other physicians Hospitalizations, surgeries, and ER visits in previous 12 months Vitals Screenings to include cognitive, depression, and falls Referrals and appointments  In addition, I have reviewed and discussed with patient certain preventive protocols, quality metrics, and best practice recommendations. A written personalized care plan for preventive services as well as general preventive health recommendations were provided to patient.     Barb Merino, LPN   19/14/7829   After Visit Summary: (MyChart) Due to this being a telephonic visit, the after visit summary with patients personalized plan was offered to patient via MyChart   Nurse Notes: none  Medical screening examination/treatment/procedure(s) were performed by non-physician practitioner and as supervising physician I was immediately available for consultation/collaboration.  I agree with above. Jacinta Shoe, MD

## 2023-09-17 NOTE — Patient Instructions (Signed)
[name] , Thank you for taking time to come for your Medicare Wellness Visit. I appreciate your ongoing commitment to your health goals. Please review the following plan we discussed and let me know if I can assist you in the future.   Referrals/Orders/Follow-Ups/Clinician Recommendations: none  This is a list of the screening recommended for you and due dates:  Health Maintenance  Topic Date Due   Eye exam for diabetics  Never done   Hepatitis C Screening  Never done   Zoster (Shingles) Vaccine (1 of 2) Never done   Screening for Lung Cancer  09/04/2004   Hemoglobin A1C  12/02/2022   Yearly kidney health urinalysis for diabetes  06/02/2023   Complete foot exam   06/02/2023   Flu Shot  01/31/2024*   Yearly kidney function blood test for diabetes  07/18/2024   Medicare Annual Wellness Visit  09/16/2024   DTaP/Tdap/Td vaccine (2 - Tdap) 12/19/2024   Pneumonia Vaccine  Completed   HPV Vaccine  Aged Out   Colon Cancer Screening  Discontinued   COVID-19 Vaccine  Discontinued  *Topic was postponed. The date shown is not the original due date.    Advanced directives: (ACP Link)Information on Advanced Care Planning can be found at [facility] Advance Health Care Directives Advance Health Care Directives ([email or web address]   Next Medicare Annual Wellness Visit scheduled for next year: Yes  insert Preventive Care attachment Insert FALL PREVENTION attachment if needed

## 2023-09-20 ENCOUNTER — Telehealth: Payer: Self-pay

## 2023-09-20 NOTE — Telephone Encounter (Signed)
MRI brain wo contrast was not done, Patient no showed for [facility].

## 2023-09-23 ENCOUNTER — Ambulatory Visit (INDEPENDENT_AMBULATORY_CARE_PROVIDER_SITE_OTHER): Payer: PPO | Admitting: Internal Medicine

## 2023-09-23 ENCOUNTER — Encounter: Payer: Self-pay | Admitting: Internal Medicine

## 2023-09-23 VITALS — BP 130/70 | HR 65 | Temp 97.5°F | Ht 65.0 in | Wt 157.4 lb

## 2023-09-23 DIAGNOSIS — C61 Malignant neoplasm of prostate: Secondary | ICD-10-CM

## 2023-09-23 DIAGNOSIS — N183 Chronic kidney disease, stage 3 unspecified: Secondary | ICD-10-CM

## 2023-09-23 DIAGNOSIS — I10 Essential (primary) hypertension: Secondary | ICD-10-CM

## 2023-09-23 DIAGNOSIS — N1832 Chronic kidney disease, stage 3b: Secondary | ICD-10-CM | POA: Diagnosis not present

## 2023-09-23 DIAGNOSIS — M1990 Unspecified osteoarthritis, unspecified site: Secondary | ICD-10-CM | POA: Diagnosis not present

## 2023-09-23 DIAGNOSIS — E1122 Type 2 diabetes mellitus with diabetic chronic kidney disease: Secondary | ICD-10-CM | POA: Diagnosis not present

## 2023-09-23 DIAGNOSIS — E118 Type 2 diabetes mellitus with unspecified complications: Secondary | ICD-10-CM

## 2023-09-23 DIAGNOSIS — Z7984 Long term (current) use of oral hypoglycemic drugs: Secondary | ICD-10-CM | POA: Diagnosis not present

## 2023-09-23 DIAGNOSIS — I25119 Atherosclerotic heart disease of native coronary artery with unspecified angina pectoris: Secondary | ICD-10-CM | POA: Diagnosis not present

## 2023-09-23 DIAGNOSIS — I129 Hypertensive chronic kidney disease with stage 1 through stage 4 chronic kidney disease, or unspecified chronic kidney disease: Secondary | ICD-10-CM | POA: Diagnosis not present

## 2023-09-23 MED ORDER — ROSUVASTATIN CALCIUM 20 MG PO TABS: 20.0000 mg | ORAL_TABLET | Freq: Every day | ORAL | 3 refills | Status: DC

## 2023-09-23 NOTE — Assessment & Plan Note (Signed)
S/p XRT - Dr Kathrynn Running

## 2023-09-23 NOTE — Progress Notes (Signed)
Subjective:  Patient ID: Harold Waters, male    DOB: Jan 31, 1946  Age: 77 y.o. MRN: [id number]  CC: Annual Exam (Check Up. PT having sleep issues possibly due to one of this prescriptions. CPAP machine reorder)   HPI [facility] presents for insomnia, CAD, COPD, memory loss  Outpatient Medications Prior to Visit  Medication Sig Dispense Refill   chlorthalidone (HYGROTON) 25 MG tablet Take 25 mg by mouth daily.     clopidogrel (PLAVIX) 75 MG tablet Take 1 tablet (75 mg total) by mouth daily. 30 tablet 11   ezetimibe (ZETIA) 10 MG tablet Take 1 tablet (10 mg total) by mouth daily. 90 tablet 3   memantine (NAMENDA XR) 21 MG CP24 24 hr capsule TAKE 1 CAPSULE (21 MG TOTAL) BY MOUTH AT BEDTIME. 90 capsule 4   olmesartan (BENICAR) 40 MG tablet Take 40 mg by mouth daily.     traZODone (DESYREL) 50 MG tablet Take 25 mg by mouth at bedtime.     albuterol (VENTOLIN HFA) 108 (90 Base) MCG/ACT inhaler Inhale 2 puffs into the lungs every 4 (four) hours as needed for wheezing or shortness of breath. (Patient not taking: Reported on 09/23/2023) 18 g 0   empagliflozin (JARDIANCE) 10 MG TABS tablet Take 1 tablet (10 mg total) by mouth daily before breakfast. (Patient not taking: Reported on 08/02/2023) 90 tablet 3   glucose blood (ONETOUCH VERIO) test strip Use to check blood sugar daily (Patient not taking: Reported on 09/23/2023) 50 each 11   isosorbide mononitrate (IMDUR) 30 MG 24 hr tablet Take 1 tablet (30 mg total) by mouth daily. 90 tablet 3   nitroGLYCERIN (NITROSTAT) 0.4 MG SL tablet Place 1 tablet under tongue every 5 mins. DO NOT USE MORE THAN 3 TABS (Patient not taking: Reported on 08/02/2023) 25 tablet 2   polyethylene glycol powder (GLYCOLAX/MIRALAX) 17 GM/SCOOP powder Take 17 g by mouth 2 (two) times daily as needed for moderate constipation. (Patient not taking: Reported on 09/23/2023) 500 g 3   sertraline (ZOLOFT) 25 MG tablet Take 1 tablet (25 mg total) by mouth daily. (Patient  not taking: Reported on 08/02/2023) 30 tablet 11   rosuvastatin (CRESTOR) 20 MG tablet Take 1 tablet (20 mg total) by mouth daily. 90 tablet 3   No facility-administered medications prior to visit.    ROS: Review of Systems  Constitutional:  Negative for appetite change, fatigue and unexpected weight change.  HENT:  Negative for congestion, nosebleeds, sneezing, sore throat and trouble swallowing.   Eyes:  Negative for itching and visual disturbance.  Respiratory:  Negative for cough.   Cardiovascular:  Negative for chest pain, palpitations and leg swelling.  Gastrointestinal:  Negative for abdominal distention, blood in stool, diarrhea and nausea.  Genitourinary:  Negative for frequency and hematuria.  Musculoskeletal:  Positive for arthralgias and back pain. Negative for gait problem, joint swelling and neck pain.  Skin:  Negative for rash.  Neurological:  Negative for dizziness, tremors, speech difficulty and weakness.  Psychiatric/Behavioral:  Positive for decreased concentration and sleep disturbance. Negative for agitation, dysphoric mood and suicidal ideas. The patient is not nervous/anxious.     Objective:  BP 130/70   Pulse 65   Temp (!) 97.5 F (36.4 C) (Oral)   Ht 5\' 5"  (1.651 m)   Wt 157 lb 6.4 oz (71.4 kg)   SpO2 97%   BMI 26.19 kg/m   BP Readings from Last 3 Encounters:  09/23/23 130/70  08/02/23 118/60  07/19/23 [name])  90/40    Wt Readings from Last 3 Encounters:  09/23/23 157 lb 6.4 oz (71.4 kg)  08/02/23 162 lb (73.5 kg)  07/19/23 167 lb 12.8 oz (76.1 kg)    Physical Exam Constitutional:      General: He is not in acute distress.    Appearance: He is well-developed.     Comments: NAD  Eyes:     Conjunctiva/sclera: Conjunctivae normal.     Pupils: Pupils are equal, round, and reactive to light.  Neck:     Thyroid: No thyromegaly.     Vascular: No JVD.  Cardiovascular:     Rate and Rhythm: Normal rate and regular rhythm.     Heart sounds: Normal  heart sounds. No murmur heard.    No friction rub. No gallop.  Pulmonary:     Effort: Pulmonary effort is normal. No respiratory distress.     Breath sounds: Normal breath sounds. No wheezing or rales.  Chest:     Chest wall: No tenderness.  Abdominal:     General: Bowel sounds are normal. There is no distension.     Palpations: Abdomen is soft. There is no mass.     Tenderness: There is no abdominal tenderness. There is no guarding or rebound.  Musculoskeletal:        General: No tenderness. Normal range of motion.     Cervical back: Normal range of motion.  Lymphadenopathy:     Cervical: No cervical adenopathy.  Skin:    General: Skin is warm and dry.     Findings: No rash.  Neurological:     Mental Status: He is alert and oriented to person, place, and time. Mental status is at baseline.     Cranial Nerves: No cranial nerve deficit.     Motor: No abnormal muscle tone.     Coordination: Coordination normal.     Gait: Gait normal.     Deep Tendon Reflexes: Reflexes are normal and symmetric.  Psychiatric:        Behavior: Behavior normal.        Thought Content: Thought content normal.        Judgment: Judgment normal.     Lab Results  Component Value Date   WBC 4.3 09/24/2023   HGB 11.7 (L) 09/24/2023   HCT 36.0 (L) 09/24/2023   PLT 222.0 09/24/2023   GLUCOSE 145 (H) 09/24/2023   CHOL 139 09/24/2023   TRIG 124.0 09/24/2023   HDL 43.10 09/24/2023   LDLDIRECT 167 (H) 10/25/2019   LDLCALC 71 09/24/2023   ALT 20 09/24/2023   AST 17 09/24/2023   NA 141 09/24/2023   K 4.9 09/24/2023   CL 107 09/24/2023   CREATININE 2.00 (H) 09/24/2023   BUN 22 09/24/2023   CO2 28 09/24/2023   TSH 4.94 09/24/2023   PSA 9.90 (H) 06/26/2021   INR 0.99 11/28/2018   HGBA1C 7.5 (H) 09/24/2023   MICROALBUR 16.3 (H) 06/01/2022    NM PET CT CARDIAC PERFUSION MULTI W/ABSOLUTE BLOODFLOW  Result Date: 07/14/2023   Findings are consistent with ischemia. The study is intermediate risk.   LV  perfusion is abnormal. There is evidence of ischemia. Defect 1: There is a small defect with moderate reduction in uptake present in the apical to mid anterolateral location(s) that is reversible. There is normal wall motion in the defect area. Consistent with ischemia. The defect is consistent with abnormal perfusion in an OM branch of the LCx .   Rest left ventricular function  is normal. Rest EF: 50%. Stress left ventricular function is normal. Stress EF: 63%. End diastolic cavity size is normal. End systolic cavity size is normal.   Myocardial blood flow was computed to be 0.76ml/g/min at rest and 1.42ml/g/min at stress. Global myocardial blood flow reserve was 1.61 and was abnormal. Flow reserve is particularly reduced in the left circumflex territory (1.42), but is also mildly reduced in the right coronary artery territory   Coronary calcium assessment not performed due to prior revascularization.   Electronically signed by: Thurmon Fair, MD EXAM: OVER-READ INTERPRETATION  CT CHEST The following report is a limited chest CT over-read performed by radiologist Dr. Irma Newness [facility] on 07/14/2023. This over-read does not include interpretation of cardiac or coronary anatomy or pathology nor does it include evaluation of the PET data. The cardiac PET-CT interpretation by the cardiologist is attached. COMPARISON:  Chest radiographs 10/15/2022.  Abdominal CT 02/23/2008 FINDINGS: Mediastinum/Nodes: No enlarged lymph nodes within the visualized mediastinum.Aortic atherosclerosis and aortic valvular calcifications are noted. Lungs/Pleura: There is no pleural effusion. Possible mild underlying central airway thickening. The visualized lungs are otherwise clear. Upper abdomen: No significant findings in the visualized upper abdomen. Musculoskeletal/Chest wall: No chest wall mass or suspicious osseous findings within the visualized chest. IMPRESSION: No significant extracardiac findings within the  visualized chest. Electronically Signed   By: Carey Bullocks M.D.   On: 07/14/2023 11:38   Assessment & Plan:   Problem List Items Addressed This Visit     Essential hypertension - Primary    BP is good Cont w/meds - list reviewed       Relevant Medications   rosuvastatin (CRESTOR) 20 MG tablet   Other Relevant Orders   TSH (Completed)   Urinalysis (Completed)   CBC with Differential/Platelet (Completed)   Lipid panel (Completed)   Comprehensive metabolic panel (Completed)   Osteoarthritis   CAD (coronary artery disease)    No CP NTG prn SL      Relevant Medications   rosuvastatin (CRESTOR) 20 MG tablet   Diabetes mellitus type 2 with complications (HCC)    Check A1c      Relevant Medications   rosuvastatin (CRESTOR) 20 MG tablet   Other Relevant Orders   Hemoglobin A1c (Completed)   Prostate cancer (HCC)    S/p XRT - Dr Kathrynn Running      Relevant Medications   rosuvastatin (CRESTOR) 20 MG tablet   Other Relevant Orders   TSH (Completed)   Urinalysis (Completed)   CBC with Differential/Platelet (Completed)   Lipid panel (Completed)   Comprehensive metabolic panel (Completed)   CRI (chronic renal insufficiency), stage 3 (moderate) (HCC)    Hydrate well Pt had labs w/other doctors      Relevant Medications   rosuvastatin (CRESTOR) 20 MG tablet   Other Relevant Orders   TSH (Completed)   Urinalysis (Completed)   CBC with Differential/Platelet (Completed)   Lipid panel (Completed)   Comprehensive metabolic panel (Completed)   Malignant neoplasm of prostate (HCC)    Monitor PSA.  Follow-up with urology.  Status post radiation therapy with Dr. Kathrynn Running         Meds ordered this encounter  Medications   rosuvastatin (CRESTOR) 20 MG tablet    Sig: Take 1 tablet (20 mg total) by mouth daily.    Dispense:  90 tablet    Refill:  3      Follow-up: Return in about 6 months (around 03/22/2024) for a follow-up visit.  Alex Brinklee Cisse,  MD

## 2023-09-23 NOTE — Assessment & Plan Note (Signed)
Hydrate well Pt had labs w/other doctors

## 2023-09-23 NOTE — Assessment & Plan Note (Signed)
Check A1c. 

## 2023-09-23 NOTE — Assessment & Plan Note (Addendum)
BP is good Cont w/meds - list reviewed

## 2023-09-24 ENCOUNTER — Encounter: Payer: Self-pay | Admitting: Pharmacist

## 2023-09-24 ENCOUNTER — Other Ambulatory Visit (INDEPENDENT_AMBULATORY_CARE_PROVIDER_SITE_OTHER): Payer: PPO

## 2023-09-24 DIAGNOSIS — E118 Type 2 diabetes mellitus with unspecified complications: Secondary | ICD-10-CM

## 2023-09-24 DIAGNOSIS — N183 Chronic kidney disease, stage 3 unspecified: Secondary | ICD-10-CM | POA: Diagnosis not present

## 2023-09-24 DIAGNOSIS — C61 Malignant neoplasm of prostate: Secondary | ICD-10-CM | POA: Diagnosis not present

## 2023-09-24 DIAGNOSIS — I10 Essential (primary) hypertension: Secondary | ICD-10-CM

## 2023-09-24 LAB — COMPREHENSIVE METABOLIC PANEL
ALT: 20 U/L (ref 0–53)
AST: 17 U/L (ref 0–37)
Albumin: 4.3 g/dL (ref 3.5–5.2)
Alkaline Phosphatase: 48 U/L (ref 39–117)
BUN: 22 mg/dL (ref 6–23)
CO2: 28 meq/L (ref 19–32)
Calcium: 9.4 mg/dL (ref 8.4–10.5)
Chloride: 107 meq/L (ref 96–112)
Creatinine, Ser: 2 mg/dL — ABNORMAL HIGH (ref 0.40–1.50)
GFR: 31.53 mL/min — ABNORMAL LOW (ref >60.00)
Glucose, Bld: 145 mg/dL — ABNORMAL HIGH (ref 70–99)
Potassium: 4.9 meq/L (ref 3.5–5.1)
Sodium: 141 meq/L (ref 135–145)
Total Bilirubin: 0.6 mg/dL (ref 0.2–1.2)
Total Protein: 6.5 g/dL (ref 6.0–8.3)

## 2023-09-24 LAB — CBC WITH DIFFERENTIAL/PLATELET
Basophils Absolute: 0 10*3/uL (ref 0.0–0.1)
Basophils Relative: 0.5 % (ref 0.0–3.0)
Eosinophils Absolute: 0.7 10*3/uL (ref 0.0–0.7)
Eosinophils Relative: 15.6 % — ABNORMAL HIGH (ref 0.0–5.0)
HCT: 36 % — ABNORMAL LOW (ref 39.0–52.0)
Hemoglobin: 11.7 g/dL — ABNORMAL LOW (ref 13.0–17.0)
Lymphocytes Relative: 22.3 % (ref 12.0–46.0)
Lymphs Abs: 1 10*3/uL (ref 0.7–4.0)
MCHC: 32.6 g/dL (ref 30.0–36.0)
MCV: 90.9 fL (ref 78.0–100.0)
Monocytes Absolute: 0.3 10*3/uL (ref 0.1–1.0)
Monocytes Relative: 7.8 % (ref 3.0–12.0)
Neutro Abs: 2.3 10*3/uL (ref 1.4–7.7)
Neutrophils Relative %: 53.8 % (ref 43.0–77.0)
Platelets: 222 10*3/uL (ref 150.0–400.0)
RBC: 3.96 Mil/uL — ABNORMAL LOW (ref 4.22–5.81)
RDW: 16 % — ABNORMAL HIGH (ref 11.5–15.5)
WBC: 4.3 10*3/uL (ref 4.0–10.5)

## 2023-09-24 LAB — LIPID PANEL
Cholesterol: 139 mg/dL (ref 0–200)
HDL: 43.1 mg/dL (ref >39.00)
LDL Cholesterol: 71 mg/dL (ref 0–99)
NonHDL: 96.13
Total CHOL/HDL Ratio: 3
Triglycerides: 124 mg/dL (ref 0.0–149.0)
VLDL: 24.8 mg/dL (ref 0.0–40.0)

## 2023-09-24 LAB — URINALYSIS
Bilirubin Urine: NEGATIVE
Hgb urine dipstick: NEGATIVE
Ketones, ur: NEGATIVE
Leukocytes,Ua: NEGATIVE
Nitrite: NEGATIVE
Specific Gravity, Urine: 1.015 (ref 1.000–1.030)
Total Protein, Urine: NEGATIVE
Urine Glucose: NEGATIVE
Urobilinogen, UA: 0.2 (ref 0.0–1.0)
pH: 6.5 (ref 5.0–8.0)

## 2023-09-24 LAB — TSH: TSH: 4.94 u[IU]/mL (ref 0.35–5.50)

## 2023-09-24 LAB — HEMOGLOBIN A1C: Hgb A1c MFr Bld: 7.5 % — ABNORMAL HIGH (ref 4.6–6.5)

## 2023-09-24 NOTE — Progress Notes (Signed)
Pharmacy Quality Measure Review  This patient is appearing on a report for being at risk of failing the adherence measure for cholesterol (statin) medications this calendar year.   Medication: rosuvastatin Last fill date: 09/23/2023 for 90 day supply  Insurance report was not up to date. No action needed at this time.   Arbutus Leas, PharmD, BCPS Clinical Pharmacist [facility] [phone]

## 2023-09-26 NOTE — Assessment & Plan Note (Signed)
No CP NTG prn SL

## 2023-09-26 NOTE — Assessment & Plan Note (Signed)
Monitor PSA.  Follow-up with urology.  Status post radiation therapy with Dr. Kathrynn Running

## 2023-10-08 ENCOUNTER — Encounter: Payer: Self-pay | Admitting: *Deleted

## 2023-10-11 ENCOUNTER — Ambulatory Visit: Payer: PPO

## 2023-10-11 DIAGNOSIS — I25110 Atherosclerotic heart disease of native coronary artery with unstable angina pectoris: Secondary | ICD-10-CM

## 2023-10-12 LAB — BASIC METABOLIC PANEL
BUN/Creatinine Ratio: 16 (ref 10–24)
BUN: 35 mg/dL — ABNORMAL HIGH (ref 8–27)
CO2: 24 mmol/L (ref 20–29)
Calcium: 9.3 mg/dL (ref 8.6–10.2)
Chloride: 110 mmol/L — ABNORMAL HIGH (ref 96–106)
Creatinine, Ser: 2.19 mg/dL — ABNORMAL HIGH (ref 0.76–1.27)
Glucose: 134 mg/dL — ABNORMAL HIGH (ref 70–99)
Potassium: 5 mmol/L (ref 3.5–5.2)
Sodium: 145 mmol/L — ABNORMAL HIGH (ref 134–144)
eGFR: 30 mL/min/{1.73_m2} — ABNORMAL LOW (ref >59)

## 2023-10-12 LAB — LIPID PANEL
Chol/HDL Ratio: 2.7 {ratio} (ref 0.0–5.0)
Cholesterol, Total: 135 mg/dL (ref 100–199)
HDL: 50 mg/dL (ref >39)
LDL Chol Calc (NIH): 68 mg/dL (ref 0–99)
Triglycerides: 90 mg/dL (ref 0–149)
VLDL Cholesterol Cal: 17 mg/dL (ref 5–40)

## 2023-10-12 LAB — ALT: ALT: 43 [IU]/L (ref 0–44)

## 2023-11-09 ENCOUNTER — Other Ambulatory Visit: Payer: Self-pay | Admitting: Physician Assistant

## 2023-11-09 ENCOUNTER — Telehealth: Payer: Self-pay | Admitting: Physician Assistant

## 2023-11-09 MED ORDER — MEMANTINE HCL ER 14 MG PO CP24: ORAL_CAPSULE | ORAL | 11 refills | Status: DC

## 2023-11-09 NOTE — Telephone Encounter (Signed)
 Patient has an appt 11/12/23 with sara. He has ran out of his memantine . They changed his dosage and he needs a refill on the correct dosage. He is panicking he is out. The daughter said it seems like he does good on 15mg . She is asking for a new RX for that dosage. 21mg  was took much for him  Pharmacy- [facility] in [place] [phone]

## 2023-11-09 NOTE — Telephone Encounter (Signed)
 Sent to pharmacy Namenda XR 14 mg nightly ( can be taken in the morning if that is how he was doing it). They don's make 15 mg doses . Thanks

## 2023-11-10 ENCOUNTER — Ambulatory Visit: Payer: PPO | Admitting: Physician Assistant

## 2023-11-10 ENCOUNTER — Ambulatory Visit (HOSPITAL_COMMUNITY)
Admission: RE | Admit: 2023-11-10 | Discharge: 2023-11-10 | Disposition: A | Discharge status: Home / Self Care | Payer: PPO | Source: Ambulatory Visit | Attending: Vascular Surgery | Admitting: Vascular Surgery

## 2023-11-10 ENCOUNTER — Ambulatory Visit (INDEPENDENT_AMBULATORY_CARE_PROVIDER_SITE_OTHER)
Admission: RE | Admit: 2023-11-10 | Discharge: 2023-11-10 | Disposition: A | Discharge status: Home / Self Care | Payer: PPO | Source: Ambulatory Visit | Attending: Vascular Surgery | Admitting: Vascular Surgery

## 2023-11-10 VITALS — BP 158/72 | HR 59 | Temp 98.0°F | Resp 18 | Ht 65.0 in | Wt 158.4 lb

## 2023-11-10 DIAGNOSIS — I872 Venous insufficiency (chronic) (peripheral): Secondary | ICD-10-CM | POA: Diagnosis not present

## 2023-11-10 DIAGNOSIS — T82858A Stenosis of vascular prosthetic devices, implants and grafts, initial encounter: Secondary | ICD-10-CM

## 2023-11-10 DIAGNOSIS — I739 Peripheral vascular disease, unspecified: Secondary | ICD-10-CM | POA: Diagnosis not present

## 2023-11-10 LAB — VAS US ABI WITH/WO TBI
Left ABI: 1
Right ABI: 0.98

## 2023-11-10 NOTE — Progress Notes (Signed)
 Office Note     CC:  follow up Requesting Provider:  Plotnikov, Karlynn GAILS, MD  HPI: Harold Waters is a 78 y.o. (12/27/1945) male who presents for routine follow up of PAD.  He has history of left common femoral to above knee popliteal artery bypass with vein by Dr. Sheree on 12/06/18. He has had some persistent left lower extremity edema. This has been managed with compression stockings. No recurrent rest pain, claudication or tissue loss. On follow up in May he was noted to have some decreased velocities within his bypass graft that were concerning for threatened bypass. He was therefore underwent Aortogram, Arteriogram of LLE with Dr. Sheree. At that time he had lazer atherectomy of the left femoral to AK popliteal bypass with stenting of the bypass. Three vessel runoff noted. Following this intervention he was doing well at the time of his last follow up in June.   Today he presents with his granddaughter. He denies any pain in his legs on ambulation or rest. No tissue loss. He does have some knee pain but attributes this to arthritis. He says he walks about 1/2 mile per day. His back and hip pain are somewhat improved from prior visits. He never did get evaluation by Orthopedics. He is medically managed on Aspirin , Plavix  and Statin.   Past Medical History:  Diagnosis Date   Angina    Chronic stomach ulcer    get them off and on (09/19/2018)   Coronary atherosclerosis of unspecified type of vessel, native or graft    Diverticulosis    DVT of lower extremity (deep venous thrombosis) (HCC) ~ 2010   LLE   Erosive gastritis    Essential hypertension, benign    GERD (gastroesophageal reflux disease)    Headache(784.0) 02/18/12   lately   History of kidney stones    Internal hemorrhoids    Lower back pain    Myocardial infarction [facility]) 1997   Osteoarthritis    Other and unspecified hyperlipidemia    PAD (peripheral artery disease) (HCC)    with ABI's 0.8 on the right and 0.86 on  the left   Pneumonia 1957   PSVT (paroxysmal supraventricular tachycardia) (HCC) 02/18/12   Shortness of breath    lying down   Sleep apnea    does not wear c-pap; pt does not recall this hx on 09/19/2018    Past Surgical History:  Procedure Laterality Date   ABDOMINAL AORTOGRAM W/LOWER EXTREMITY N/A 03/22/2023   Procedure: ABDOMINAL AORTOGRAM W/LOWER EXTREMITY;  Surgeon: Sheree Penne Bruckner, MD;  Location: [facility] INVASIVE CV LAB;  Service: Cardiovascular;  Laterality: N/A;   ARTHROSCOPY KNEE W/ DRILLING Right ~ 2001   BYPASS GRAFT Left 12/05/2018   femoral popliteal    CARDIAC CATHETERIZATION  09/19/2018   CATARACT EXTRACTION     COLONOSCOPY     CORONARY ANGIOPLASTY WITH STENT PLACEMENT  1997   2   CORONARY PRESSURE/FFR STUDY  09/19/2018   CORONARY PRESSURE/FFR STUDY N/A 09/19/2018   Procedure: INTRAVASCULAR PRESSURE WIRE/FFR STUDY;  Surgeon: Court Dorn PARAS, MD;  Location: [facility] INVASIVE CV LAB;  Service: Cardiovascular;  Laterality: N/A;   FEMORAL-POPLITEAL BYPASS GRAFT Left 12/06/2018   Procedure: BYPASS GRAFT FEMORAL-POPLITEAL ARTERY;  Surgeon: Sheree Penne Bruckner, MD;  Location: [facility] OR;  Service: Vascular;  Laterality: Left;   ILIAC ARTERY STENT  04/22/2017   . Placement of a 6 mm x 100 mm Viabahn covered stent left SFA   LEFT HEART CATH AND CORONARY ANGIOGRAPHY N/A 09/19/2018  Procedure: LEFT HEART CATH AND CORONARY ANGIOGRAPHY;  Surgeon: Court Dorn PARAS, MD;  Location: [facility] INVASIVE CV LAB;  Service: Cardiovascular;  Laterality: N/A;   LEFT HEART CATHETERIZATION WITH CORONARY ANGIOGRAM N/A 02/19/2012   Procedure: LEFT HEART CATHETERIZATION WITH CORONARY ANGIOGRAM;  Surgeon: Ezra GORMAN Shuck, MD;  Location: [facility] CATH LAB;  Service: Cardiovascular;  Laterality: N/A;   LOWER EXTREMITY ANGIOGRAPHY Bilateral 04/22/2017   Procedure: Lower Extremity Angiography;  Surgeon: Court Dorn PARAS, MD;  Location: [facility] INVASIVE CV LAB;  Service: Cardiovascular;  Laterality: Bilateral;    LOWER EXTREMITY ANGIOGRAPHY Bilateral 09/19/2018   LOWER EXTREMITY ANGIOGRAPHY Bilateral 09/19/2018   Procedure: LOWER EXTREMITY ANGIOGRAPHY;  Surgeon: Court Dorn PARAS, MD;  Location: [facility] INVASIVE CV LAB;  Service: Cardiovascular;  Laterality: Bilateral;   PERIPHERAL ARTERIAL STENT GRAFT  09/2010   LLE   PERIPHERAL VASCULAR ATHERECTOMY Left 04/22/2017   Procedure: Peripheral Vascular Atherectomy;  Surgeon: Court Dorn PARAS, MD;  Location: [facility] INVASIVE CV LAB;  Service: Cardiovascular;  Laterality: Left;  SFA   PERIPHERAL VASCULAR ATHERECTOMY  03/22/2023   Procedure: PERIPHERAL VASCULAR ATHERECTOMY;  Surgeon: Sheree Penne Bruckner, MD;  Location: [facility] INVASIVE CV LAB;  Service: Cardiovascular;;   PERIPHERAL VASCULAR INTERVENTION Left 04/22/2017   Procedure: Peripheral Vascular Intervention;  Surgeon: Court Dorn PARAS, MD;  Location: [facility] INVASIVE CV LAB;  Service: Cardiovascular;  Laterality: Left;  SFA   PERIPHERAL VASCULAR INTERVENTION  03/22/2023   Procedure: PERIPHERAL VASCULAR INTERVENTION;  Surgeon: Sheree Penne Bruckner, MD;  Location: [facility] INVASIVE CV LAB;  Service: Cardiovascular;;   UPPER GASTROINTESTINAL ENDOSCOPY      Social History   Socioeconomic History   Marital status: Married    Spouse name: Not on file   Number of children: 2   Years of education: 12   Highest education level: Not on file  Occupational History   Occupation: retired  Tobacco Use   Smoking status: Former    Current packs/day: 0.00    Average packs/day: 1 pack/day for 40.0 years (40.0 ttl pk-yrs)    Types: Cigarettes    Start date: 04/03/1970    Quit date: 04/03/2010    Years since quitting: 13.6    Passive exposure: Never   Smokeless tobacco: Never  Vaping Use   Vaping status: Never Used  Substance and Sexual Activity   Alcohol use: Not Currently    Comment: used to drink when I was young; quit ~ 1980's   Drug use: Never   Sexual activity: Yes  Other Topics Concern   Not on file  Social  History Narrative   Right handed   Two story home   4 children   Wife lives with him in the home   Caffeine on occasion   Social Drivers of Health   Financial Resource Strain: Low Risk  (09/17/2023)   Overall Financial Resource Strain (CARDIA)    Difficulty of Paying Living Expenses: Not hard at all  Food Insecurity: No Food Insecurity (09/17/2023)   Hunger Vital Sign    Worried About Running Out of Food in the Last Year: Never true    Ran Out of Food in the Last Year: Never true  Transportation Needs: No Transportation Needs (09/17/2023)   PRAPARE - Administrator, Civil Service (Medical): No    Lack of Transportation (Non-Medical): No  Physical Activity: Sufficiently Active (09/17/2023)   Exercise Vital Sign    Days of Exercise per Week: 7 days    Minutes of Exercise per Session: 60 min  Stress: No Stress Concern Present (09/17/2023)   [employer] of Occupational Health - Occupational Stress Questionnaire    Feeling of Stress : Not at all  Social Connections: Moderately Isolated (09/17/2023)   Social Connection and Isolation Panel [NHANES]    Frequency of Communication with Friends and Family: More than three times a week    Frequency of Social Gatherings with Friends and Family: Twice a week    Attends Religious Services: Never    Database Administrator or Organizations: No    Attends Engineer, Structural: Not on file    Marital Status: Married  Catering Manager Violence: Not At Risk (09/17/2023)   Humiliation, Afraid, Rape, and Kick questionnaire    Fear of Current or Ex-Partner: No    Emotionally Abused: No    Physically Abused: No    Sexually Abused: No    Family History  Problem Relation Age of Onset   Ulcers Father        had stomach issues, not sure what happened   Coronary artery disease Brother        male 1st degree relative <50   Colon cancer Neg Hx    Esophageal cancer Neg Hx    Rectal cancer Neg Hx    Stomach cancer Neg Hx      Current Outpatient Medications  Medication Sig Dispense Refill   chlorthalidone (HYGROTON) 25 MG tablet Take 25 mg by mouth daily.     clopidogrel  (PLAVIX ) 75 MG tablet Take 1 tablet (75 mg total) by mouth daily. 30 tablet 11   ezetimibe  (ZETIA ) 10 MG tablet Take 1 tablet (10 mg total) by mouth daily. 90 tablet 3   memantine  (NAMENDA  XR) 14 MG CP24 24 hr capsule Take one capsule nightly 30 capsule 11   olmesartan (BENICAR) 40 MG tablet Take 40 mg by mouth daily.     polyethylene glycol powder (GLYCOLAX /MIRALAX ) 17 GM/SCOOP powder Take 17 g by mouth 2 (two) times daily as needed for moderate constipation. 500 g 3   rosuvastatin  (CRESTOR ) 20 MG tablet Take 1 tablet (20 mg total) by mouth daily. 90 tablet 3   sertraline  (ZOLOFT ) 25 MG tablet Take 1 tablet (25 mg total) by mouth daily. 30 tablet 11   traZODone  (DESYREL ) 50 MG tablet Take 25 mg by mouth at bedtime.     albuterol  (VENTOLIN  HFA) 108 (90 Base) MCG/ACT inhaler Inhale 2 puffs into the lungs every 4 (four) hours as needed for wheezing or shortness of breath. (Patient not taking: Reported on 11/10/2023) 18 g 0   empagliflozin  (JARDIANCE ) 10 MG TABS tablet Take 1 tablet (10 mg total) by mouth daily before breakfast. (Patient not taking: Reported on 11/10/2023) 90 tablet 3   glucose blood (ONETOUCH VERIO) test strip Use to check blood sugar daily (Patient not taking: Reported on 09/23/2023) 50 each 11   isosorbide  mononitrate (IMDUR ) 30 MG 24 hr tablet Take 1 tablet (30 mg total) by mouth daily. 90 tablet 3   nitroGLYCERIN  (NITROSTAT ) 0.4 MG SL tablet Place 1 tablet under tongue every 5 mins. DO NOT USE MORE THAN 3 TABS (Patient not taking: Reported on 08/02/2023) 25 tablet 2   No current facility-administered medications for this visit.    Allergies  Allergen Reactions   Egg-Derived Products Itching   Aricept  [Donepezil ] Nausea And Vomiting   Coreg  [Carvedilol ] Other (See Comments)    Fatigue     REVIEW OF SYSTEMS:  [X]  denotes  positive finding, [ ]  denotes negative finding  Cardiac  Comments:  Chest pain or chest pressure:    Shortness of breath upon exertion:    Short of breath when lying flat:    Irregular heart rhythm:        Vascular    Pain in calf, thigh, or hip brought on by ambulation:    Pain in feet at night that wakes you up from your sleep:     Blood clot in your veins:    Leg swelling:         Pulmonary    Oxygen at home:    Productive cough:     Wheezing:         Neurologic    Sudden weakness in arms or legs:     Sudden numbness in arms or legs:     Sudden onset of difficulty speaking or slurred speech:    Temporary loss of vision in one eye:     Problems with dizziness:         Gastrointestinal    Blood in stool:     Vomited blood:         Genitourinary    Burning when urinating:     Blood in urine:        Psychiatric    Major depression:         Hematologic    Bleeding problems:    Problems with blood clotting too easily:        Skin    Rashes or ulcers:        Constitutional    Fever or chills:      PHYSICAL EXAMINATION:  Vitals:   11/10/23 1031  BP: (!) 158/72  Pulse: (!) 59  Resp: 18  Temp: 98 F (36.7 C)  TempSrc: Temporal  SpO2: 95%  Weight: 158 lb 6.4 oz (71.8 kg)  Height: 5' 5 (1.651 m)    General:  WDWN in NAD; vital signs documented above Gait: Normal HENT: WNL, normocephalic Pulmonary: normal non-labored breathing , without wheezing Cardiac: regular HR Abdomen: soft Vascular Exam/Pulses: 2+ femoral, 2+ Dp pulses bilaterally, feet warm and well perfused Extremities: without ischemic changes, without Gangrene , without cellulitis; without open wounds;  Musculoskeletal: no muscle wasting or atrophy  Neurologic: A&O X 3 Psychiatric:  The pt has Normal affect.   Non-Invasive Vascular Imaging:   +-------+-----------+-----------+------------+------------+  ABI/TBIToday's ABIToday's TBIPrevious ABIPrevious TBI   +-------+-----------+-----------+------------+------------+  Right 0.98       0.77       1.04        0.69          +-------+-----------+-----------+------------+------------+  Left  1.00       0.68       1.01        0.42          +-------+-----------+-----------+------------+------------+   VAS US  Lower extremity bypass graft duplex: Summary:  Left: Patent stent within proximal left femoral-popliteal bypass graft.   Patent left femoral-popliteal bypass graft without evidence of hemodynamically significant stenosis.    ASSESSMENT/PLAN:: 78 y.o. male here for follow up for PAD. He has history of left common femoral to above knee popliteal artery bypass with vein by Dr. Sheree on 12/06/18. He has had some persistent left lower extremity edema. This has been managed with compression stockings. No recurrent rest pain, claudication or tissue loss. On follow up in May he was noted to have some decreased velocities within his bypass graft that were concerning for threatened bypass. He was therefore underwent Aortogram,  Arteriogram of LLE with Dr. Sheree. At that time he had lazer atherectomy of the left femoral to AK popliteal bypass with stenting of the bypass. He has done very well since. He does not have any claudication, rest pain or tissue loss. - ABI is essentially unchanged from 6 months ago - Duplex shows patent stent and patent left fem- popliteal bypass graft - Continue Aspirin , statin, Plavix  - Continue compression stockings as needed  - Encourage walking regimen - Follow up again in 6 months with LLE bypass graft duplex and ABI   Teretha Damme, PA-C [facility] [phone]  Clinic MD:   Sheree

## 2023-11-12 ENCOUNTER — Ambulatory Visit: Payer: PPO | Admitting: Physician Assistant

## 2023-11-23 ENCOUNTER — Other Ambulatory Visit: Payer: Self-pay

## 2023-11-23 DIAGNOSIS — I739 Peripheral vascular disease, unspecified: Secondary | ICD-10-CM

## 2023-11-23 NOTE — Addendum Note (Signed)
Addended by: Leilani Able, Ibraheem Voris A on: 11/23/2023 09:17 AM   Modules accepted: Orders

## 2023-12-01 ENCOUNTER — Ambulatory Visit: Payer: PPO | Admitting: Physician Assistant

## 2023-12-01 ENCOUNTER — Encounter: Payer: Self-pay | Admitting: Internal Medicine

## 2023-12-01 ENCOUNTER — Encounter: Payer: Self-pay | Admitting: Physician Assistant

## 2023-12-01 VITALS — BP 123/54 | HR 74 | Resp 18 | Ht 65.0 in | Wt 158.0 lb

## 2023-12-01 DIAGNOSIS — F0393 Unspecified dementia, unspecified severity, with mood disturbance: Secondary | ICD-10-CM

## 2023-12-01 NOTE — Progress Notes (Signed)
Assessment/Plan:   Dementia, mild of unclear etiology   Harold Waters is a very pleasant 78 y.o. RH male with a history off uncontrolled hypertension, hyperlipidemia, PAD, history of migraines with aura, severe sleep apnea noncompliant with CPAP, history of prostate cancer, status post seed implant seen today in follow up for memory loss. Patient is currently on memantine XR 14 mg nightly after he was unable to tolerate 21 mg due to increase sleepiness. Vascular etiology is suspected. MMSE is 28/30, memory is stable. Patient is able to participate on his ADLs and to drive without difficulties . He has contacted his PCP regarding renewing the CPAP which could be very beneficial.      Follow up in  6 months. Continue memantine XR 14 mg nightly, side effects discussed Start  sertraline 25 mg daily for depression, side effects discussed. Recommend repeating MRI of the brain to further evaluate memory complaints, and compared it with prior on May 2020. Recommend resuming CPAP for OSA, follow-up with prescribing physician. Recommend good control of her cardiovascular risk factors, continue Plavix and aspirin. Continue to control mood as per PCP Monitor driving    Subjective:    This patient is accompanied in the office by his daughter  who supplements the history.  Previous records as well as any outside records available were reviewed prior to todays visit. Patient was last seen on 07/19/2023, with the MMSE of 23/30.    Any changes in memory since last visit? " About the same".  He has difficulties with recent conversations and new information.  He has to write a list to go to the grocery store otherwise he will be forgetting items. repeats oneself?  Endorsed Disoriented when walking into a room?  Endorsed, he is aware of it.   Leaving objects?  May misplace things such as glasses but not in unusual places   Wandering behavior?  denies   Any personality changes since last visit?   He may have abrupt temper, but not often. Any worsening depression?:  Denies.   Hallucinations or paranoia?  Denies.   Seizures? denies    Any sleep changes? Sleeps well with trazodone. Denies vivid dreams, REM behavior or sleepwalking   Sleep apnea?  Endorsed, noncompliant with his CPAP Any hygiene concerns? Denies.  Independent of bathing and dressing?  Endorsed  Does the patient needs help with medications?  Daughter is in charge   Who is in charge of the finances?  Patient  is in charge     Any changes in appetite?  denies     Patient have trouble swallowing? Denies.   Does the patient cook? No Any headaches?   denies   Chronic back pain  denies   Ambulates with difficulty?  He has arthritic pain, which may limit his ambulation.  Recent falls or head injuries? denies     Unilateral weakness, numbness or tingling? denies   Any tremors?  Denies   Any anosmia?  Denies   Any incontinence of urine?  Endorsed, wears diapers Any bowel dysfunction?   Denies      Patient lives with wife   Does the patient drive?  Endorsed, at times he may forget the exit   Continues to work, running cleaning services part-time.  Daughter works with him side-by-side. , planning to "slow down "    History on Initial Assessment 08/23/2019: This is a pleasant 78 year old right-handed man with a history of hypertension, hyperlipidemia, PAD, presenting for evaluation of memory  loss. He feels that memory changes started soon after his surgery in February 2020 where he had a left femoral to AK popliteal artery bypass under general anesthesia.  Prior to this, he had minor memory changes, but soon after surgery he noticed that he was unable to do things that he could do easily in the past such as math. He used to do large numbers in the back of his head, but has more difficulty now. He lives with his wife who constantly says something about his memory saying "you forget everything," which upsets him since this was not  happening prior to the surgery. He denies getting lost driving but has missed his exit a few times because he is not focusing or forgets where he is going. He has forgotten bill payments and has had late charges (new as well). He has been forgetting his medications, his wife now helps him. He loses things constantly, his phone is lost all the time. He has put things for the pantry in the freezer. He denies leaving the stove on. He has word-finding difficulties and forgets names more. 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 He has also had a change in sleep pattern, he has always had sleep difficulties getting an average of 4 hours of sleep, but since the surgery he would only get 2-3 hours of sleep. With Trazodone 1 tab qhs he gets 4 hours. He has daytime drowsiness and would drag his feet because he is so tired. Mood is good. No family history of dementia, no history of significant head injuries or alcohol use.   He gets dizzy when walking or changing clothes, no falls. He recalls an episode of double vision 3-4 years ago, this has resolved. He has occasional difficulty swallowing water. He has some numbness in both hands. He denies any headaches, focal numbness/tingling/weakness, bowel/bladder dysfunction, anosmia, or tremors. He denies any prior history of stroke, head CT done in 07/2019 showed an old left posterior parietal infarct and old left superior cerebellar lacunar infarcts, mild chronic microvascular disease, no acute change.     MRI brain showed mild diffuse atrophy and moderate chronic microvascular disease, chronic small strokes in the left occipital lobe, bilateral cerebellar hemispheres and thalami.   PREVIOUS MEDICATIONS: Memantine 10 mg nightly  CURRENT MEDICATIONS:  Outpatient Encounter Medications as of 12/01/2023  Medication Sig   albuterol (VENTOLIN HFA)  108 (90 Base) MCG/ACT inhaler Inhale 2 puffs into the lungs every 4 (four) hours as needed for wheezing or shortness of breath.   chlorthalidone (HYGROTON) 25 MG tablet Take 25 mg by mouth daily.   clopidogrel (PLAVIX) 75 MG tablet Take 1 tablet (75 mg total) by mouth daily.   empagliflozin (JARDIANCE) 10 MG TABS tablet Take 1 tablet (10 mg total) by mouth daily before breakfast.   ezetimibe (ZETIA) 10 MG tablet Take 1 tablet (10 mg total) by mouth daily.   glucose blood (ONETOUCH VERIO) test strip Use to check blood sugar daily   memantine (NAMENDA XR) 14 MG CP24 24 hr capsule Take one capsule nightly   nitroGLYCERIN (NITROSTAT) 0.4 MG SL tablet Place 1 tablet under tongue every 5 mins. DO NOT USE MORE THAN 3 TABS   olmesartan (BENICAR) 40 MG tablet Take 40 mg by mouth daily.   polyethylene glycol powder (GLYCOLAX/MIRALAX) 17 GM/SCOOP powder Take 17 g by mouth  2 (two) times daily as needed for moderate constipation.   rosuvastatin (CRESTOR) 20 MG tablet Take 1 tablet (20 mg total) by mouth daily.   sertraline (ZOLOFT) 25 MG tablet Take 1 tablet (25 mg total) by mouth daily.   traZODone (DESYREL) 50 MG tablet Take 25 mg by mouth at bedtime.   isosorbide mononitrate (IMDUR) 30 MG 24 hr tablet Take 1 tablet (30 mg total) by mouth daily.   No facility-administered encounter medications on file as of 12/01/2023.       12/01/2023    3:00 PM 07/19/2023   11:00 AM 05/12/2023   12:00 PM  MMSE - Mini Mental State Exam  Orientation to time 5 5 5   Orientation to Place 5 4 5   Registration 3 3 3   Attention/ Calculation 4 0 5  Recall 3 3 3   Language- name 2 objects 2 2 2   Language- repeat 1 1 1   Language- follow 3 step command 3 3 3   Language- read & follow direction 1 1 1   Write a sentence 1 1 1   Copy design 0 0 1  Total score 28 23 30        No data to display          Objective:     PHYSICAL EXAMINATION:    VITALS:   Vitals:   12/01/23 1440  BP: (!) 123/54  Pulse: 74  Resp: 18   SpO2: 99%  Weight: 158 lb (71.7 kg)  Height: 5\' 5"  (1.651 m)    GEN:  The patient appears stated age and is in NAD. HEENT:  Normocephalic, atraumatic.   Neurological examination:  General: NAD, well-groomed, appears stated age. Orientation: The patient is alert. Oriented to person, place and date Cranial nerves: There is good facial symmetry.The speech is fluent and clear. No aphasia or dysarthria. Fund of knowledge is appropriate. Recent and remote memory are impaired. Attention and concentration are normal.  Able to name objects and repeat phrases.  Hearing is intact to conversational tone.   Sensation: Sensation is intact to light touch throughout Motor: Strength is at least antigravity x4. DTR's 2/4 in UE/LE     Movement examination: Tone: There is normal tone in the UE/LE Abnormal movements:  no tremor.  No myoclonus.  No asterixis.   Coordination:  There is no decremation with RAM's. Normal finger to nose  Gait and Station: The patient has no difficulty arising out of a deep-seated chair without the use of the hands. The patient's stride length is good.  Gait is cautious and narrow.    Thank you for allowing [place] the opportunity to participate in the care of this nice patient. Please do not hesitate to contact us for any questions or concerns.   Total time spent on today's visit was 30 minutes dedicated to this patient today, preparing to see patient, examining the patient, ordering tests and/or medications and counseling the patient, documenting clinical information in the EHR or other health record, independently interpreting results and communicating results to the patient/family, discussing treatment and goals, answering patient's questions and coordinating care.  Cc:  Plotnikov, Georgina Quint, MD  Marlowe Kays 12/01/2023 3:29 PM

## 2023-12-01 NOTE — Patient Instructions (Addendum)
It was a pleasure to see you today at our office.   Recommendations:   Continue Memantine XR14 mg at night . Side effects were discussed  Follow up sleep apnea with Dr. Posey Rea regarding CPAP Start sertraline 25 mg at night   Whom to call:  Memory  decline, memory medications: Call our office [phone]   For psychiatric meds, mood meds: Please have your primary care physician manage these medications.      For assessment of decision of mental capacity and competency:  Call Dr. Erick Blinks, geriatric psychiatrist at [phone]  For guidance in geriatric dementia issues please call [facility] [phone]     If you have any severe symptoms of a stroke, or other severe issues such as confusion,severe chills or fever, etc call 911 or go to the ER as you may need to be evaluated further             RECOMMENDATIONS FOR ALL PATIENTS WITH MEMORY PROBLEMS: 1. Continue to exercise (Recommend 30 minutes of walking everyday, or 3 hours every week) 2. Increase social interactions - continue going to [place] and enjoy social gatherings with friends and family 3. Eat healthy, avoid fried foods and eat more fruits and vegetables 4. Maintain adequate blood pressure, blood sugar, and blood cholesterol level. Reducing the risk of stroke and cardiovascular disease also helps promoting better memory. 5. Avoid stressful situations. Live a simple life and avoid aggravations. Organize your time and prepare for the next day in anticipation. 6. Sleep well, avoid any interruptions of sleep and avoid any distractions in the bedroom that may interfere with adequate sleep quality 7. Avoid sugar, avoid sweets as there is a strong link between excessive sugar intake, diabetes, and cognitive impairment We discussed the Mediterranean diet, which has been shown to help patients reduce the risk of progressive memory disorders and reduces cardiovascular risk. This includes eating fish, eat  fruits and green leafy vegetables, nuts like almonds and hazelnuts, walnuts, and also use olive oil. Avoid fast foods and fried foods as much as possible. Avoid sweets and sugar as sugar use has been linked to worsening of memory function.  There is always a concern of gradual progression of memory problems. If this is the case, then we may need to adjust level of care according to patient needs. Support, both to the patient and caregiver, should then be put into place.    FALL PRECAUTIONS: Be cautious when walking. Scan the area for obstacles that may increase the risk of trips and falls. When getting up in the mornings, sit up at the edge of the bed for a few minutes before getting out of bed. Consider elevating the bed at the head end to avoid drop of blood pressure when getting up. Walk always in a well-lit room (use night lights in the walls). Avoid area rugs or power cords from appliances in the middle of the walkways. Use a walker or a cane if necessary and consider physical therapy for balance exercise. Get your eyesight checked regularly.  FINANCIAL OVERSIGHT: Supervision, especially oversight when making financial decisions or transactions is also recommended.  HOME SAFETY: Consider the safety of the kitchen when operating appliances like stoves, microwave oven, and blender. Consider having supervision and share cooking responsibilities until no longer able to participate in those. Accidents with firearms and other hazards in the house should be identified and addressed as well.   ABILITY TO BE LEFT ALONE: If patient is unable to contact 911  operator, consider using LifeLine, or when the need is there, arrange for someone to stay with patients. Smoking is a fire hazard, consider supervision or cessation. Risk of wandering should be assessed by caregiver and if detected at any point, supervision and safe proof recommendations should be instituted.  MEDICATION SUPERVISION: Inability to  self-administer medication needs to be constantly addressed. Implement a mechanism to ensure safe administration of the medications.   DRIVING: Regarding driving, in patients with progressive memory problems, driving will be impaired. We advise to have someone else do the driving if trouble finding directions or if minor accidents are reported. Independent driving assessment is available to determine safety of driving.   If you are interested in the driving assessment, you can contact the following:  [employer] in [place] [phone]  [facility] [phone]  [facility] [phone]  [place] [phone] or [phone]  MRI at [facility] [phone]

## 2023-12-02 ENCOUNTER — Other Ambulatory Visit: Payer: Self-pay | Admitting: Physician Assistant

## 2023-12-02 NOTE — Telephone Encounter (Signed)
Pt has asked "My neurologist would like me to get back on the sleep apnea machine. Do I need another test or can I get the machine."

## 2023-12-04 ENCOUNTER — Other Ambulatory Visit: Payer: Self-pay | Admitting: Internal Medicine

## 2023-12-04 DIAGNOSIS — R0683 Snoring: Secondary | ICD-10-CM

## 2023-12-08 ENCOUNTER — Ambulatory Visit: Payer: PPO | Attending: Internal Medicine | Admitting: Internal Medicine

## 2023-12-08 ENCOUNTER — Encounter: Payer: Self-pay | Admitting: Internal Medicine

## 2023-12-08 VITALS — BP 108/60 | HR 60 | Ht 65.0 in | Wt 159.0 lb

## 2023-12-08 DIAGNOSIS — E1169 Type 2 diabetes mellitus with other specified complication: Secondary | ICD-10-CM | POA: Diagnosis not present

## 2023-12-08 DIAGNOSIS — I351 Nonrheumatic aortic (valve) insufficiency: Secondary | ICD-10-CM | POA: Diagnosis not present

## 2023-12-08 DIAGNOSIS — I25110 Atherosclerotic heart disease of native coronary artery with unstable angina pectoris: Secondary | ICD-10-CM

## 2023-12-08 DIAGNOSIS — N184 Chronic kidney disease, stage 4 (severe): Secondary | ICD-10-CM | POA: Insufficient documentation

## 2023-12-08 DIAGNOSIS — E785 Hyperlipidemia, unspecified: Secondary | ICD-10-CM | POA: Diagnosis not present

## 2023-12-08 DIAGNOSIS — I739 Peripheral vascular disease, unspecified: Secondary | ICD-10-CM

## 2023-12-08 DIAGNOSIS — I25119 Atherosclerotic heart disease of native coronary artery with unspecified angina pectoris: Secondary | ICD-10-CM

## 2023-12-08 HISTORY — DX: Chronic kidney disease, stage 4 (severe): N18.4

## 2023-12-08 MED ORDER — EMPAGLIFLOZIN 10 MG PO TABS: 10.0000 mg | ORAL_TABLET | Freq: Every day | ORAL | 3 refills | Status: AC

## 2023-12-08 NOTE — Patient Instructions (Signed)
 Medication Instructions:  Your physician has recommended you make the following change in your medication:  RESTART: Jardiance  10 mg by mouth once daily before breakfast Around Feb 14.  Please have nephrologist follow up after starting Jardiance .   *If you need a refill on your cardiac medications before your next appointment, please call your pharmacy*   Lab Work: NONE If you have labs (blood work) drawn today and your tests are completely normal, you will receive your results only by: MyChart Message (if you have MyChart) OR A paper copy in the mail If you have any lab test that is abnormal or we need to change your treatment, we will call you to review the results.   Testing/Procedures: MAY 2025- -  - Your physician has requested that you have an echocardiogram. Echocardiography is a painless test that uses sound waves to create images of your heart. It provides your doctor with information about the size and shape of your heart and how well your heart's chambers and valves are working. This procedure takes approximately one hour. There are no restrictions for this procedure. Please do NOT wear cologne, perfume, aftershave, or lotions (deodorant is allowed). Please arrive 15 minutes prior to your appointment time.  Please note: We ask at that you not bring children with you during ultrasound (echo/ vascular) testing. Due to room size and safety concerns, children are not allowed in the ultrasound rooms during exams. Our front office staff cannot provide observation of children in our lobby area while testing is being conducted. An adult accompanying a patient to their appointment will only be allowed in the ultrasound room at the discretion of the ultrasound technician under special circumstances. We apologize for any inconvenience.    Follow-Up: At [facility], you and your health needs are our priority.  As part of our continuing mission to provide you with exceptional heart  care, we have created designated Provider Care Teams.  These Care Teams include your primary Cardiologist (physician) and Advanced Practice Providers (APPs -  Physician Assistants and Nurse Practitioners) who all work together to provide you with the care you need, when you need it.   Your next appointment:   7 month(s)  Provider:   Ozell Fell, MD     Other Instructions

## 2023-12-08 NOTE — Progress Notes (Signed)
 Cardiology Office Note:  .    Date:  12/08/2023  ID:  Harold Waters, DOB 03/19/1946, MRN Harold Waters PCP: Harold Karlynn GAILS, MD  [facility] Providers Cardiologist:  Harold Fell, MD     CC: DOD- F/u CAD  History of Present Illness: Harold Waters    Harold Waters is a 78 y.o. male with a history of coronary artery disease and peripheral arterial disease, presents for evaluation after a stress test.       Relevant histories: .  Social: comes with daughter, he is from [place] 2024: Started on SGLT2i; then stopped due to donut hole ROS: As per HPI.   Studies Reviewed: .      Cardiac Studies & Procedures   CARDIAC CATHETERIZATION  CARDIAC CATHETERIZATION 09/19/2018  Narrative Images from the original result were not included.   Prox RCA to Mid RCA lesion is 90% stenosed.  Ost 2nd Mrg to 2nd Mrg lesion is 99% stenosed.  Prox LAD lesion is 60% stenosed.  Ost 1st Diag lesion is 50% stenosed.   Harold Waters is a 78 y.o. male   Harold Waters LOCATION:  FACILITY: [facility] PHYSICIAN: Dorn Lesches, M.D. Nov 12, 1945   DATE OF PROCEDURE:  09/19/2018  DATE OF DISCHARGE:     CARDIAC CATHETERIZATION    History obtained from chart review.Harold Waters is a 78 y.o.  thin-appearing married Latino male father of 4 children, grandfather and grandchildren, referred by Dr. Fell for peripheral vascular evaluation. I last saw him in the office 05/07/2017. He has a history of treated hyperlipidemia. He has had a remote stroke and has had intervention on his nondominant right answer complex coronary arteries in the past. He stopped smoking 8 years ago but did smoke a pack a day for 40 years. He had a stent placed in his left SFA November 2011 by Dr. Fell which resulted in improvement in his claudication. Over the last several months he's had reappearance of his claudication which is now lifestyle limiting. Dopplers performed 03/22/17 show a decline in his ABI from  0.96-2.71 with what appears to be an occluded left SFA stent.  I perform peripheral angiography and endovascular intervention on him 04/22/17. He had an occluded left SFA stent which I opened up and placed a Viabahn  covered stent with an excellent result. He did have 3 vessel. His claudication has completely resolved.  Since I saw him a year ago he has developed some recurrent left calf claudication.  He had lower extremity Dopplers performed 11/24/2017 that revealed normal ABIs bilaterally with a patent stent.  In addition, he has noticed some increasing exertional chest pain and shortness of breath.     PROCEDURE DESCRIPTION:  The patient was brought to the second floor [facility] cath lab in the postabsorptive state. He was not premedicated . His right groin was prepped and shaved in usual sterile fashion. Xylocaine  1% was used for local anesthesia. A 5 French sheath was inserted into the right common femoral artery using standard Seldinger technique under ultrasound guidance.  A 5 French pigtail catheter was used for distal abdominal aortography.  5 French right and left Judkins diagnostic catheters were used for selective coronary angiography, subselective left internal mammary artery angiography and obtain left heart pressures.  A 5 French crossover catheter and endhole catheters were used for left lower extremity angiography with runoff.  On the pigtail was used for the entirety of the case.  Retrograde aorta, left ventricular and pullback pressures were recorded.  The patient received Angiomax  bolus followed by infusion with a therapeutic ACT demonstrated.  Using a 6 French XB LAD 3.5 cm guide catheter along with a 0.14 pro-water guidewire and an Assist FFR device the lesion was crossed after pressures were normalized/equalized and adenosine  was administered with FFR demonstrated to be 0.82.   Peripheral vascular angiogram-  1: Abdominal aorta- small infrarenal abdominal aortic  aneurysm above the iliac bifurcation 2: Left lower extremity- the mid left SFA is chronically occluded at the site of prior stenting with reconstitution of the above-the-knee popliteal by profunda femoris collaterals in the adductor canal.  There was three-vessel runoff with segmental disease in the posterior tibial artery.  Impression Harold Waters has three-vessel coronary disease with a high-grade nondominant RCA stent restenosis, high-grade second marginal branch in-stent restenosis, and moderate LAD diagonal branch bifurcation disease with an FFR of 0.82 suggesting borderline hemodynamic significance.  In addition, his left SFA stent is occluded above and below with three-vessel runoff and a highly diseased left posterior tibial artery.  I believe his coronary disease can be treated medically at this time and he will need left femoropopliteal bypass grafting.  I have discussed this with Drs. Cooper and Dr. Sheree.  Dorn Lesches. MD, [facility] 09/19/2018 9:20 AM     Recommend Aspirin  81mg  daily for moderate CAD.  Findings Coronary Findings Diagnostic  Dominance: Left  Left Anterior Descending Prox LAD lesion is 60% stenosed.  First Diagonal Branch Ost 1st Diag lesion is 50% stenosed.  Left Circumflex  Second Obtuse Marginal Branch Ost 2nd Mrg to 2nd Mrg lesion is 99% stenosed. The lesion was previously treated.  Right Coronary Artery Prox RCA to Mid RCA lesion is 90% stenosed. The lesion was previously treated.  Intervention  No interventions have been documented.   STRESS TESTS  NM PET CT CARDIAC PERFUSION MULTI W/ABSOLUTE BLOODFLOW 07/14/2023  Narrative   Findings are consistent with ischemia. The study is intermediate risk.   LV perfusion is abnormal. There is evidence of ischemia. Defect 1: There is a small defect with moderate reduction in uptake present in the apical to mid anterolateral location(s) that is reversible. There is normal wall motion in the defect area.  Consistent with ischemia. The defect is consistent with abnormal perfusion in an OM branch of the LCx .   Rest left ventricular function is normal. Rest EF: 50%. Stress left ventricular function is normal. Stress EF: 63%. End diastolic cavity size is normal. End systolic cavity size is normal.   Myocardial blood flow was computed to be 0.66ml/g/min at rest and 1.06ml/g/min at stress. Global myocardial blood flow reserve was 1.61 and was abnormal. Flow reserve is particularly reduced in the left circumflex territory (1.42), but is also mildly reduced in the right coronary artery territory   Coronary calcium  assessment not performed due to prior revascularization.   Electronically signed by: Jerel Balding, MD  EXAM: OVER-READ INTERPRETATION  CT CHEST  The following report is a limited chest CT over-read performed by radiologist Dr. Elsie Ko [facility] on 07/14/2023. This over-read does not include interpretation of cardiac or coronary anatomy or pathology nor does it include evaluation of the PET data. The cardiac PET-CT interpretation by the cardiologist is attached.  COMPARISON:  Chest radiographs 10/15/2022.  Abdominal CT 02/23/2008  FINDINGS: Mediastinum/Nodes: No enlarged lymph nodes within the visualized mediastinum.Aortic atherosclerosis and aortic valvular calcifications are noted.  Lungs/Pleura: There is no pleural effusion. Possible mild underlying central airway thickening. The visualized lungs are otherwise clear.  Upper abdomen: No significant findings in the visualized upper abdomen.  Musculoskeletal/Chest wall: No chest wall mass or suspicious osseous findings within the visualized chest.  IMPRESSION: No significant extracardiac findings within the visualized chest.   Electronically Signed By: Elsie Perone M.D. On: 07/14/2023 11:38  ECHOCARDIOGRAM  ECHOCARDIOGRAM COMPLETE 03/17/2023  Narrative ECHOCARDIOGRAM REPORT    Patient Name:    Amadeus Oyama Legrande Date of Exam: 03/17/2023 Medical Rec #:  Harold Waters            Height:       65.0 in Accession #:    [id number]           Weight:       170.0 lb Date of Birth:  [birth year]             BSA:          1.846 m Patient Age:    77 years             BP:           110/58 mmHg Patient Gender: M                    HR:           65 bpm. Exam Location:  [address]  Procedure: 2D Echo, 3D Echo, Cardiac Doppler and Color Doppler  Indications:    I35.1 Aortic Insufficiency I25.10 CAD  History:        Patient has prior history of Echocardiogram examinations, most recent 08/19/2018. CAD and Previous Myocardial Infarction, PAD, Aortic Valve Disease, Arrythmias:PSVT, Signs/Symptoms:Shortness of Breath; Risk Factors:Sleep Apnea, Former Smoker, Dyslipidemia, Hypertension and Family History of Coronary Artery Disease. DVT.  Sonographer:    Heather Hawks RDCS Referring Phys: [facility] A Lazar Tierce  IMPRESSIONS   1. Left ventricular ejection fraction, by estimation, is 60 to 65%. The left ventricle has normal function. The left ventricle has no regional wall motion abnormalities. Left ventricular diastolic parameters are consistent with Grade I diastolic dysfunction (impaired relaxation). 2. Right ventricular systolic function is normal. The right ventricular size is normal. There is normal pulmonary artery systolic pressure. The estimated right ventricular systolic pressure is 18.1 mmHg. 3. The mitral valve is grossly normal. Mild mitral valve regurgitation. 4. The aortic valve is tricuspid. There is moderate calcification of the aortic valve. There is moderate thickening of the aortic valve. Aortic valve regurgitation is mild to moderate. Mild aortic valve stenosis. Aortic valve area, by VTI measures 1.87 cm. Aortic valve mean gradient measures 10.0 mmHg. Aortic valve Vmax measures 2.14 m/s. 5. The inferior vena cava is normal in size with greater than 50% respiratory variability,  suggesting right atrial pressure of 3 mmHg.  Comparison(s): Compared to prior TTE in 2019, the AR appears mild to mod (previously mild) and there is now mild AS (mean gradient ).  FINDINGS Left Ventricle: Left ventricular ejection fraction, by estimation, is 60 to 65%. The left ventricle has normal function. The left ventricle has no regional wall motion abnormalities. 3D ejection fraction reviewed and evaluated as part of the interpretation. Alternate measurement of EF is felt to be most reflective of LV function. The left ventricular internal cavity size was normal in size. There is no left ventricular hypertrophy. Left ventricular diastolic parameters are consistent with Grade I diastolic dysfunction (impaired relaxation).  Right Ventricle: The right ventricular size is normal. No increase in right ventricular wall thickness. Right ventricular systolic function is normal. There is normal pulmonary artery systolic pressure.  The tricuspid regurgitant velocity is 1.94 m/s, and with an assumed right atrial pressure of 3 mmHg, the estimated right ventricular systolic pressure is 18.1 mmHg.  Left Atrium: Left atrial size was normal in size.  Right Atrium: Right atrial size was normal in size.  Pericardium: There is no evidence of pericardial effusion.  Mitral Valve: The mitral valve is grossly normal. There is mild thickening of the mitral valve leaflet(s). There is mild calcification of the mitral valve leaflet(s). Mild to moderate mitral annular calcification. Mild mitral valve regurgitation.  Tricuspid Valve: The tricuspid valve is normal in structure. Tricuspid valve regurgitation is mild.  Aortic Valve: The aortic valve is tricuspid. There is moderate calcification of the aortic valve. There is moderate thickening of the aortic valve. Aortic valve regurgitation is mild to moderate. Aortic regurgitation PHT measures 389 msec. Mild aortic stenosis is present. Aortic valve mean gradient  measures 10.0 mmHg. Aortic valve peak gradient measures 18.3 mmHg. Aortic valve area, by VTI measures 1.87 cm.  Pulmonic Valve: The pulmonic valve was normal in structure. Pulmonic valve regurgitation is mild.  Aorta: The aortic root is normal in size and structure.  Venous: The inferior vena cava is normal in size with greater than 50% respiratory variability, suggesting right atrial pressure of 3 mmHg.  IAS/Shunts: The atrial septum is grossly normal.   LEFT VENTRICLE PLAX 2D LVIDd:         4.80 cm   Diastology LVIDs:         3.00 cm   LV e' medial:    5.66 cm/s LV PW:         0.90 cm   LV E/e' medial:  11.1 LV IVS:        0.80 cm   LV e' lateral:   8.81 cm/s LVOT diam:     2.30 cm   LV E/e' lateral: 7.2 LV SV:         98 LV SV Index:   53 LVOT Area:     4.15 cm  3D Volume EF: 3D EF:        73 % LV EDV:       161 ml LV ESV:       43 ml LV SV:        118 ml  RIGHT VENTRICLE RV Basal diam:  3.30 cm RV S prime:     9.93 cm/s TAPSE (M-mode): 2.0 cm RVSP:           18.1 mmHg  LEFT ATRIUM             Index        RIGHT ATRIUM           Index LA diam:        4.20 cm 2.28 cm/m   RA Pressure: 3.00 mmHg LA Vol (A2C):   60.7 ml 32.88 ml/m  RA Area:     16.10 cm LA Vol (A4C):   51.1 ml 27.68 ml/m  RA Volume:   43.40 ml  23.51 ml/m LA Biplane Vol: 58.0 ml 31.42 ml/m AORTIC VALVE AV Area (Vmax):    1.75 cm AV Area (Vmean):   1.74 cm AV Area (VTI):     1.87 cm AV Vmax:           214.00 cm/s AV Vmean:          142.000 cm/s AV VTI:            0.523 m AV Peak Grad:  18.3 mmHg AV Mean Grad:      10.0 mmHg LVOT Vmax:         90.10 cm/s LVOT Vmean:        59.450 cm/s LVOT VTI:          0.235 m LVOT/AV VTI ratio: 0.45 AI PHT:            389 msec  AORTA Ao Root diam: 3.40 cm Ao Asc diam:  3.50 cm  MITRAL VALVE                TRICUSPID VALVE MV Area (PHT): cm          TR Peak grad:   15.1 mmHg MV Decel Time: 277 msec     TR Vmax:        194.00 cm/s MR Peak grad:  83.4 mmHg     Estimated RAP:  3.00 mmHg MR Mean grad: 56.5 mmHg     RVSP:           18.1 mmHg MR Vmax:      456.50 cm/s MR Vmean:     353.5 cm/s    SHUNTS MV E velocity: 63.05 cm/s   Systemic VTI:  0.24 m MV A velocity: 103.50 cm/s  Systemic Diam: 2.30 cm MV E/A ratio:  0.61  Powell Sorrow MD Electronically signed by Powell Sorrow MD Signature Date/Time: 03/17/2023/1:08:00 PM    Final              Physical Exam:    VS:  BP 108/60   Pulse 60   Ht 5' 5 (1.651 m)   Wt 159 lb (72.1 kg)   BMI 26.46 kg/m    Wt Readings from Last 3 Encounters:  12/08/23 159 lb (72.1 kg)  12/01/23 158 lb (71.7 kg)  11/10/23 158 lb 6.4 oz (71.8 kg)    Gen: No distress   Neck: No JVD Ears: Bilateral Dempsey Sign Cardiac: No Rubs or Gallops, systolic murmur with diastolic murmur, RRR, Distant heart sounds +2 radial pulses Respiratory: CTAB, normal effort, normal  respiratory rate GI: Soft, nontender, non-distended  MS: +1 bilateral edema;  moves all extremities Integument: Skin feels warm Neuro:  At time of evaluation, alert and oriented to person/place/time/situation  Psych: Normal affect, patient feels well   ASSESSMENT AND PLAN: .    Chronic Kidney Disease (CKD) Stage 4 CKD stage 4 with borderline kidney function. Plan to start Jardiance  to slow CKD progression and monitor kidney function. - Start Jardiance  approximately one week before lab work with Dr. Dalene on December 17, 2023 - Order labs to recheck kidney function one to two weeks after starting Jardiance   Coronary Artery Disease (CAD) CAD managed medically with no new chest pain. Plan to continue conservative management unless symptoms worsen, in which case consider intervention with minimal contrast due to CKD. - Continue current medical management - Follow up with Dr. Wonda in the fall  Aortic Valve Disease Mild to moderate non-rheumatic aortic valve disease with no significant progression. Echocardiogram scheduled  for May 2025. Consider earlier intervention if significant progression is noted. - Continue monitoring - Ensure echocardiogram is scheduled for May 2025  Peripheral Arterial Disease (PAD) Statin Myopathy - Continue current management, asymptomatic   Memory Issues Memory issues improved on reduced dose of memantine  (14 mg). Contributing factors include stress and sleep apnea.  - Continue memantine  14 mg - Address contributing factors such as stress  General Health Maintenance Discussed the importance of managing stress and sleep  apnea to improve overall health and memory issues. - Continue working on stress management and sleep apnea treatment  Follow-up - Follow up with Dr. Wonda in the fall - Follow up with Dr. Dalene for lab work on December 17, 2023.    Stanly Leavens, MD FASE [facility] Cardiologist [facility]  [address] [place], [address] [place], [place] [address] [phone]  10:30 AM

## 2023-12-24 ENCOUNTER — Encounter: Payer: Self-pay | Admitting: Pulmonary Disease

## 2023-12-27 DIAGNOSIS — I129 Hypertensive chronic kidney disease with stage 1 through stage 4 chronic kidney disease, or unspecified chronic kidney disease: Secondary | ICD-10-CM | POA: Diagnosis not present

## 2023-12-27 DIAGNOSIS — N1832 Chronic kidney disease, stage 3b: Secondary | ICD-10-CM | POA: Diagnosis not present

## 2023-12-27 DIAGNOSIS — E1122 Type 2 diabetes mellitus with diabetic chronic kidney disease: Secondary | ICD-10-CM | POA: Diagnosis not present

## 2023-12-27 DIAGNOSIS — N261 Atrophy of kidney (terminal): Secondary | ICD-10-CM | POA: Diagnosis not present

## 2023-12-31 ENCOUNTER — Other Ambulatory Visit: Payer: Self-pay | Admitting: Physician Assistant

## 2024-01-11 ENCOUNTER — Encounter (HOSPITAL_BASED_OUTPATIENT_CLINIC_OR_DEPARTMENT_OTHER): Payer: Self-pay

## 2024-03-02 DIAGNOSIS — Z8546 Personal history of malignant neoplasm of prostate: Secondary | ICD-10-CM | POA: Diagnosis not present

## 2024-03-06 ENCOUNTER — Other Ambulatory Visit (HOSPITAL_COMMUNITY): Payer: PPO

## 2024-03-06 ENCOUNTER — Ambulatory Visit (HOSPITAL_COMMUNITY): Payer: PPO | Attending: Cardiovascular Disease

## 2024-03-06 DIAGNOSIS — N184 Chronic kidney disease, stage 4 (severe): Secondary | ICD-10-CM | POA: Insufficient documentation

## 2024-03-06 DIAGNOSIS — I351 Nonrheumatic aortic (valve) insufficiency: Secondary | ICD-10-CM | POA: Diagnosis not present

## 2024-03-06 LAB — ECHOCARDIOGRAM COMPLETE
AR max vel: 1.85 cm2
AV Area VTI: 1.82 cm2
AV Area mean vel: 1.75 cm2
AV Mean grad: 12.3 mmHg
AV Peak grad: 22.3 mmHg
Ao pk vel: 2.36 m/s
Area-P 1/2: 2.48 cm2
P 1/2 time: 508 ms
S' Lateral: 2.3 cm

## 2024-03-09 DIAGNOSIS — Z8546 Personal history of malignant neoplasm of prostate: Secondary | ICD-10-CM | POA: Diagnosis not present

## 2024-03-17 ENCOUNTER — Other Ambulatory Visit: Payer: Self-pay

## 2024-03-17 ENCOUNTER — Telehealth: Payer: Self-pay | Admitting: Nurse Practitioner

## 2024-03-17 ENCOUNTER — Ambulatory Visit: Payer: Self-pay

## 2024-03-17 ENCOUNTER — Inpatient Hospital Stay (HOSPITAL_COMMUNITY)
Admission: EM | Admit: 2024-03-17 | Discharge: 2024-03-19 | DRG: 682 | Disposition: A | Discharge status: Home / Self Care | Attending: Student | Admitting: Student

## 2024-03-17 ENCOUNTER — Ambulatory Visit

## 2024-03-17 ENCOUNTER — Ambulatory Visit: Admitting: Nurse Practitioner

## 2024-03-17 ENCOUNTER — Ambulatory Visit: Payer: Self-pay | Admitting: Nurse Practitioner

## 2024-03-17 ENCOUNTER — Emergency Department (HOSPITAL_COMMUNITY)

## 2024-03-17 VITALS — BP 96/54 | HR 85 | Temp 99.0°F | Wt 153.0 lb

## 2024-03-17 DIAGNOSIS — Z91012 Allergy to eggs: Secondary | ICD-10-CM

## 2024-03-17 DIAGNOSIS — Z7984 Long term (current) use of oral hypoglycemic drugs: Secondary | ICD-10-CM

## 2024-03-17 DIAGNOSIS — I959 Hypotension, unspecified: Secondary | ICD-10-CM | POA: Diagnosis not present

## 2024-03-17 DIAGNOSIS — F419 Anxiety disorder, unspecified: Secondary | ICD-10-CM | POA: Diagnosis not present

## 2024-03-17 DIAGNOSIS — K219 Gastro-esophageal reflux disease without esophagitis: Secondary | ICD-10-CM | POA: Diagnosis present

## 2024-03-17 DIAGNOSIS — I129 Hypertensive chronic kidney disease with stage 1 through stage 4 chronic kidney disease, or unspecified chronic kidney disease: Secondary | ICD-10-CM | POA: Diagnosis not present

## 2024-03-17 DIAGNOSIS — E1151 Type 2 diabetes mellitus with diabetic peripheral angiopathy without gangrene: Secondary | ICD-10-CM | POA: Diagnosis present

## 2024-03-17 DIAGNOSIS — E871 Hypo-osmolality and hyponatremia: Secondary | ICD-10-CM | POA: Diagnosis not present

## 2024-03-17 DIAGNOSIS — J189 Pneumonia, unspecified organism: Secondary | ICD-10-CM | POA: Diagnosis not present

## 2024-03-17 DIAGNOSIS — Z955 Presence of coronary angioplasty implant and graft: Secondary | ICD-10-CM

## 2024-03-17 DIAGNOSIS — E861 Hypovolemia: Secondary | ICD-10-CM | POA: Diagnosis present

## 2024-03-17 DIAGNOSIS — I252 Old myocardial infarction: Secondary | ICD-10-CM

## 2024-03-17 DIAGNOSIS — Z7902 Long term (current) use of antithrombotics/antiplatelets: Secondary | ICD-10-CM | POA: Diagnosis not present

## 2024-03-17 DIAGNOSIS — E785 Hyperlipidemia, unspecified: Secondary | ICD-10-CM | POA: Diagnosis present

## 2024-03-17 DIAGNOSIS — E872 Acidosis, unspecified: Secondary | ICD-10-CM | POA: Diagnosis not present

## 2024-03-17 DIAGNOSIS — F32A Depression, unspecified: Secondary | ICD-10-CM | POA: Diagnosis not present

## 2024-03-17 DIAGNOSIS — R413 Other amnesia: Secondary | ICD-10-CM | POA: Diagnosis present

## 2024-03-17 DIAGNOSIS — I25119 Atherosclerotic heart disease of native coronary artery with unspecified angina pectoris: Secondary | ICD-10-CM | POA: Diagnosis not present

## 2024-03-17 DIAGNOSIS — E118 Type 2 diabetes mellitus with unspecified complications: Secondary | ICD-10-CM | POA: Diagnosis present

## 2024-03-17 DIAGNOSIS — N1832 Chronic kidney disease, stage 3b: Secondary | ICD-10-CM | POA: Diagnosis not present

## 2024-03-17 DIAGNOSIS — N179 Acute kidney failure, unspecified: Secondary | ICD-10-CM | POA: Diagnosis not present

## 2024-03-17 DIAGNOSIS — I2510 Atherosclerotic heart disease of native coronary artery without angina pectoris: Secondary | ICD-10-CM | POA: Diagnosis not present

## 2024-03-17 DIAGNOSIS — E1165 Type 2 diabetes mellitus with hyperglycemia: Secondary | ICD-10-CM | POA: Diagnosis not present

## 2024-03-17 DIAGNOSIS — Z86718 Personal history of other venous thrombosis and embolism: Secondary | ICD-10-CM | POA: Diagnosis not present

## 2024-03-17 DIAGNOSIS — I10 Essential (primary) hypertension: Secondary | ICD-10-CM | POA: Diagnosis present

## 2024-03-17 DIAGNOSIS — R053 Chronic cough: Secondary | ICD-10-CM | POA: Diagnosis present

## 2024-03-17 DIAGNOSIS — Z8711 Personal history of peptic ulcer disease: Secondary | ICD-10-CM

## 2024-03-17 DIAGNOSIS — Z87442 Personal history of urinary calculi: Secondary | ICD-10-CM

## 2024-03-17 DIAGNOSIS — Z888 Allergy status to other drugs, medicaments and biological substances status: Secondary | ICD-10-CM

## 2024-03-17 DIAGNOSIS — I4719 Other supraventricular tachycardia: Secondary | ICD-10-CM | POA: Diagnosis not present

## 2024-03-17 DIAGNOSIS — I739 Peripheral vascular disease, unspecified: Secondary | ICD-10-CM | POA: Diagnosis present

## 2024-03-17 DIAGNOSIS — G4730 Sleep apnea, unspecified: Secondary | ICD-10-CM | POA: Diagnosis present

## 2024-03-17 DIAGNOSIS — I700 Atherosclerosis of aorta: Secondary | ICD-10-CM | POA: Diagnosis not present

## 2024-03-17 DIAGNOSIS — I359 Nonrheumatic aortic valve disorder, unspecified: Secondary | ICD-10-CM | POA: Diagnosis present

## 2024-03-17 DIAGNOSIS — Z8249 Family history of ischemic heart disease and other diseases of the circulatory system: Secondary | ICD-10-CM

## 2024-03-17 DIAGNOSIS — R059 Cough, unspecified: Secondary | ICD-10-CM | POA: Diagnosis not present

## 2024-03-17 DIAGNOSIS — E1122 Type 2 diabetes mellitus with diabetic chronic kidney disease: Secondary | ICD-10-CM | POA: Diagnosis not present

## 2024-03-17 DIAGNOSIS — J984 Other disorders of lung: Secondary | ICD-10-CM | POA: Diagnosis not present

## 2024-03-17 DIAGNOSIS — Z87891 Personal history of nicotine dependence: Secondary | ICD-10-CM | POA: Diagnosis not present

## 2024-03-17 DIAGNOSIS — R918 Other nonspecific abnormal finding of lung field: Secondary | ICD-10-CM | POA: Diagnosis not present

## 2024-03-17 DIAGNOSIS — Z79899 Other long term (current) drug therapy: Secondary | ICD-10-CM

## 2024-03-17 HISTORY — DX: Pneumonia, unspecified organism: J18.9

## 2024-03-17 LAB — POCT RESPIRATORY SYNCYTIAL VIRUS: RSV Rapid Ag: NEGATIVE

## 2024-03-17 LAB — COMPREHENSIVE METABOLIC PANEL WITH GFR
ALT: 23 U/L (ref 0–53)
AST: 14 U/L (ref 0–37)
Albumin: 4.3 g/dL (ref 3.5–5.2)
Alkaline Phosphatase: 52 U/L (ref 39–117)
BUN: 49 mg/dL — ABNORMAL HIGH (ref 6–23)
CO2: 26 meq/L (ref 19–32)
Calcium: 9.1 mg/dL (ref 8.4–10.5)
Chloride: 103 meq/L (ref 96–112)
Creatinine, Ser: 3.59 mg/dL — ABNORMAL HIGH (ref 0.40–1.50)
GFR: 15.58 mL/min — ABNORMAL LOW (ref >60.00)
Glucose, Bld: 159 mg/dL — ABNORMAL HIGH (ref 70–99)
Potassium: 4.4 meq/L (ref 3.5–5.1)
Sodium: 138 meq/L (ref 135–145)
Total Bilirubin: 0.8 mg/dL (ref 0.2–1.2)
Total Protein: 7.3 g/dL (ref 6.0–8.3)

## 2024-03-17 LAB — TROPONIN I (HIGH SENSITIVITY)
Troponin I (High Sensitivity): 9 ng/L (ref <18)
Troponin I (High Sensitivity): 9 ng/L (ref <18)

## 2024-03-17 LAB — BASIC METABOLIC PANEL WITH GFR
Anion gap: 16 — ABNORMAL HIGH (ref 5–15)
BUN: 54 mg/dL — ABNORMAL HIGH (ref 8–23)
CO2: 18 mmol/L — ABNORMAL LOW (ref 22–32)
Calcium: 8.8 mg/dL — ABNORMAL LOW (ref 8.9–10.3)
Chloride: 100 mmol/L (ref 98–111)
Creatinine, Ser: 3.93 mg/dL — ABNORMAL HIGH (ref 0.61–1.24)
GFR, Estimated: 15 mL/min — ABNORMAL LOW (ref >60)
Glucose, Bld: 122 mg/dL — ABNORMAL HIGH (ref 70–99)
Potassium: 4.2 mmol/L (ref 3.5–5.1)
Sodium: 134 mmol/L — ABNORMAL LOW (ref 135–145)

## 2024-03-17 LAB — CBC
HCT: 36.9 % — ABNORMAL LOW (ref 39.0–52.0)
Hemoglobin: 11.8 g/dL — ABNORMAL LOW (ref 13.0–17.0)
MCH: 29.8 pg (ref 26.0–34.0)
MCHC: 32 g/dL (ref 30.0–36.0)
MCV: 93.2 fL (ref 80.0–100.0)
Platelets: 188 10*3/uL (ref 150–400)
RBC: 3.96 MIL/uL — ABNORMAL LOW (ref 4.22–5.81)
RDW: 13.4 % (ref 11.5–15.5)
WBC: 6.2 10*3/uL (ref 4.0–10.5)
nRBC: 0 % (ref 0.0–0.2)

## 2024-03-17 LAB — POC COVID19 BINAXNOW: SARS Coronavirus 2 Ag: NEGATIVE

## 2024-03-17 LAB — CBC WITH DIFFERENTIAL/PLATELET
Basophils Absolute: 0 10*3/uL (ref 0.0–0.1)
Basophils Relative: 0.3 % (ref 0.0–3.0)
Eosinophils Absolute: 0.1 10*3/uL (ref 0.0–0.7)
Eosinophils Relative: 1 % (ref 0.0–5.0)
HCT: 35.9 % — ABNORMAL LOW (ref 39.0–52.0)
Hemoglobin: 11.8 g/dL — ABNORMAL LOW (ref 13.0–17.0)
Lymphocytes Relative: 15 % (ref 12.0–46.0)
Lymphs Abs: 1 10*3/uL (ref 0.7–4.0)
MCHC: 32.7 g/dL (ref 30.0–36.0)
MCV: 90.4 fl (ref 78.0–100.0)
Monocytes Absolute: 0.8 10*3/uL (ref 0.1–1.0)
Monocytes Relative: 12.7 % — ABNORMAL HIGH (ref 3.0–12.0)
Neutro Abs: 4.6 10*3/uL (ref 1.4–7.7)
Neutrophils Relative %: 71 % (ref 43.0–77.0)
Platelets: 191 10*3/uL (ref 150.0–400.0)
RBC: 3.97 Mil/uL — ABNORMAL LOW (ref 4.22–5.81)
RDW: 14.2 % (ref 11.5–15.5)
WBC: 6.5 10*3/uL (ref 4.0–10.5)

## 2024-03-17 LAB — POCT INFLUENZA A/B
Influenza A, POC: NEGATIVE
Influenza B, POC: NEGATIVE

## 2024-03-17 LAB — CBG MONITORING, ED: Glucose-Capillary: 95 mg/dL (ref 70–99)

## 2024-03-17 LAB — I-STAT CG4 LACTIC ACID, ED: Lactic Acid, Venous: 0.8 mmol/L (ref 0.5–1.9)

## 2024-03-17 LAB — HEMOGLOBIN A1C
Hgb A1c MFr Bld: 6.7 % — ABNORMAL HIGH (ref 4.8–5.6)
Mean Plasma Glucose: 145.59 mg/dL

## 2024-03-17 LAB — POCT RAPID STREP A (OFFICE): Rapid Strep A Screen: NEGATIVE

## 2024-03-17 MED ORDER — DOXYCYCLINE HYCLATE 100 MG PO TABS: 100.0000 mg | ORAL_TABLET | Freq: Two times a day (BID) | ORAL | 0 refills | Status: DC

## 2024-03-17 MED ORDER — CLOPIDOGREL BISULFATE 75 MG PO TABS
75.0000 mg | ORAL_TABLET | Freq: Every day | ORAL | Status: DC
  Administered 2024-03-18 – 2024-03-19 (×2): 75 mg via ORAL
  Filled 2024-03-17 (×2): qty 1

## 2024-03-17 MED ORDER — ALBUTEROL SULFATE HFA 108 (90 BASE) MCG/ACT IN AERS: 2.0000 | INHALATION_SPRAY | RESPIRATORY_TRACT | 0 refills | Status: DC | PRN

## 2024-03-17 MED ORDER — GUAIFENESIN-DM 100-10 MG/5ML PO SYRP
5.0000 mL | ORAL_SOLUTION | ORAL | Status: DC | PRN
  Administered 2024-03-19: 5 mL via ORAL
  Filled 2024-03-17: qty 5

## 2024-03-17 MED ORDER — SODIUM CHLORIDE 0.9 % IV BOLUS
1000.0000 mL | Freq: Once | INTRAVENOUS | Status: AC
  Administered 2024-03-17: 1000 mL via INTRAVENOUS

## 2024-03-17 MED ORDER — PROCHLORPERAZINE EDISYLATE 10 MG/2ML IJ SOLN: 5.0000 mg | Freq: Four times a day (QID) | INTRAMUSCULAR | Status: DC | PRN

## 2024-03-17 MED ORDER — ROSUVASTATIN CALCIUM 20 MG PO TABS
20.0000 mg | ORAL_TABLET | Freq: Every day | ORAL | Status: DC
  Administered 2024-03-18 – 2024-03-19 (×2): 20 mg via ORAL
  Filled 2024-03-17: qty 4
  Filled 2024-03-17: qty 1

## 2024-03-17 MED ORDER — SODIUM CHLORIDE 0.9 % IV SOLN
500.0000 mg | Freq: Once | INTRAVENOUS | Status: AC
  Administered 2024-03-17: 500 mg via INTRAVENOUS
  Filled 2024-03-17: qty 5

## 2024-03-17 MED ORDER — IPRATROPIUM-ALBUTEROL 0.5-2.5 (3) MG/3ML IN SOLN
3.0000 mL | Freq: Four times a day (QID) | RESPIRATORY_TRACT | Status: DC
  Administered 2024-03-18 (×2): 3 mL via RESPIRATORY_TRACT
  Filled 2024-03-17: qty 3

## 2024-03-17 MED ORDER — SODIUM CHLORIDE 0.9 % IV SOLN
1.0000 g | INTRAVENOUS | Status: DC
  Administered 2024-03-18: 1 g via INTRAVENOUS
  Filled 2024-03-17: qty 10

## 2024-03-17 MED ORDER — SODIUM CHLORIDE 0.9 % IV SOLN
1.0000 g | Freq: Once | INTRAVENOUS | Status: AC
  Administered 2024-03-17: 1 g via INTRAVENOUS
  Filled 2024-03-17: qty 10

## 2024-03-17 MED ORDER — ENOXAPARIN SODIUM 30 MG/0.3ML IJ SOSY
30.0000 mg | PREFILLED_SYRINGE | Freq: Every day | INTRAMUSCULAR | Status: DC
  Administered 2024-03-18 – 2024-03-19 (×2): 30 mg via SUBCUTANEOUS
  Filled 2024-03-17 (×2): qty 0.3

## 2024-03-17 MED ORDER — INSULIN ASPART 100 UNIT/ML IJ SOLN: 0.0000 [IU] | Freq: Every day | INTRAMUSCULAR | Status: DC

## 2024-03-17 MED ORDER — AZITHROMYCIN 500 MG IV SOLR
500.0000 mg | INTRAVENOUS | Status: DC
  Administered 2024-03-19: 500 mg via INTRAVENOUS
  Filled 2024-03-17: qty 5

## 2024-03-17 MED ORDER — INSULIN ASPART 100 UNIT/ML IJ SOLN
0.0000 [IU] | Freq: Three times a day (TID) | INTRAMUSCULAR | Status: DC
  Administered 2024-03-18: 2 [IU] via SUBCUTANEOUS
  Administered 2024-03-18 – 2024-03-19 (×3): 1 [IU] via SUBCUTANEOUS

## 2024-03-17 MED ORDER — SODIUM CHLORIDE 0.9 % IV BOLUS: 500.0000 mL | Freq: Once | INTRAVENOUS | Status: DC

## 2024-03-17 MED ORDER — SODIUM CHLORIDE 0.9 % IV SOLN: INTRAVENOUS | Status: AC

## 2024-03-17 MED ORDER — MELATONIN 5 MG PO TABS: 5.0000 mg | ORAL_TABLET | Freq: Every evening | ORAL | Status: DC | PRN

## 2024-03-17 MED ORDER — ACETAMINOPHEN 325 MG PO TABS: 650.0000 mg | ORAL_TABLET | Freq: Four times a day (QID) | ORAL | Status: DC | PRN

## 2024-03-17 MED ORDER — IPRATROPIUM-ALBUTEROL 0.5-2.5 (3) MG/3ML IN SOLN
3.0000 mL | Freq: Once | RESPIRATORY_TRACT | Status: AC
  Administered 2024-03-17: 3 mL via RESPIRATORY_TRACT
  Filled 2024-03-17: qty 3

## 2024-03-17 MED ORDER — POLYETHYLENE GLYCOL 3350 17 G PO PACK: 17.0000 g | PACK | Freq: Every day | ORAL | Status: DC | PRN

## 2024-03-17 MED ORDER — EZETIMIBE 10 MG PO TABS
10.0000 mg | ORAL_TABLET | Freq: Every day | ORAL | Status: DC
  Administered 2024-03-18 – 2024-03-19 (×2): 10 mg via ORAL
  Filled 2024-03-17 (×2): qty 1

## 2024-03-17 MED ORDER — BENZONATATE 100 MG PO CAPS: 100.0000 mg | ORAL_CAPSULE | Freq: Two times a day (BID) | ORAL | 0 refills | Status: DC | PRN

## 2024-03-17 NOTE — Assessment & Plan Note (Signed)
 Acute, concern for community-acquired pneumonia Check CBC with differential, CMP, stat chest x-ray Point-of-care COVID, RSV, flu, strep: Negative Treat with doxycycline  100 mg twice daily x 10 days, albuterol  inhaler every 4 hours as needed for wheezing, Tessalon  Perles 100 mg twice daily as needed for cough suppression. Follow-up Monday or Tuesday for close monitoring. Discussed that if symptoms worsen at all in 24 to 48 hours patient should proceed to the emergency department, also discussed that if symptoms do not improve within the next 48 hours he should proceed to the emergency department.

## 2024-03-17 NOTE — ED Provider Triage Note (Signed)
 Emergency Medicine Provider Triage Evaluation Note  [facility] [place] , a 78 y.o. male  was evaluated in triage.  Pt complains of weakness.  The second patient seen by his primary care physician today and diagnosed with pneumonia.  Patient started on doxycycline .  Patient was sent to the emergency department for evaluation due to worsening kidney function.  Patient reports he has not been eating or drinking well  Review of Systems  Positive: Cough and congestion Negative: Fever  Physical Exam  BP (!) 82/43 (BP Location: Right Arm)   Pulse 79   Temp 98.3 F (36.8 C)   Resp 18   Ht 5\' 5"  (1.651 m)   Wt 69.4 kg   SpO2 92%   BMI 25.46 kg/m  Gen:   Awake, no distress   Resp:  Normal effort  MSK:   Moves extremities without difficulty  Other:    Medical Decision Making  Medically screening exam initiated at 7:58 PM.  Appropriate orders placed.  [facility] [place] was informed that the remainder of the evaluation will be completed by another provider, this initial triage assessment does not replace that evaluation, and the importance of remaining in the ED until their evaluation is complete.  Patient's blood pressure is 82/43.  Patient's notes from primary care physician reviewed.  IV ordered.   Sandi Crosby, [place] 03/17/24 2003

## 2024-03-17 NOTE — Telephone Encounter (Signed)
 Copied from CRM [id number]. Topic: Clinical - Red Word Triage >> Mar 17, 2024  9:47 AM Magdalene [employer] wrote: Red Word that prompted transfer to Nurse Triage: has not slept in days due to cough and congestion which is affecting dementia.   Chief Complaint: Cough Symptoms: Cough, difficulty sleeping, loss of appetite  Frequency: Persistent cough Pertinent Negatives: Patient denies fever Disposition: [] ED /[] Urgent Care (no appt availability in office) / [x] Appointment(In office/virtual)/ []  [facility]/ [] Home Care/ [] Refused Recommended Disposition /[]  Mobile Bus/ []  Follow-up with PCP Additional Notes: Patient's daughter called to report that the patient has had a cough that began earlier this week. She states his cough has been worsening and is causing him difficulty sleeping. She states that the patient has also had some loss of appetite. Patient has not had any fevers. Patient tested negative for COVID and flu via OTC rapid test. Appointment made for the patient today for evaluation and treatment of his symptoms.    Reason for Disposition  [1] Continuous (nonstop) coughing interferes with work or school AND [2] no improvement using cough treatment per Care Advice  Answer Assessment - Initial Assessment Questions 1. ONSET: "When did the cough begin?"      This week 2. SEVERITY: "How bad is the cough today?"      Moderate  3. SPUTUM: "Describe the color of your sputum" (none, dry cough; clear, white, yellow, green)     Yes, daughter unsure of color 4. HEMOPTYSIS: "Are you coughing up any blood?" If so ask: "How much?" (flecks, streaks, tablespoons, etc.)     No 5. DIFFICULTY BREATHING: "Are you having difficulty breathing?" If Yes, ask: "How bad is it?" (e.g., mild, moderate, severe)    - MILD: No SOB at rest, mild SOB with walking, speaks normally in sentences, can lie down, no retractions, pulse < 100.    - MODERATE: SOB at rest, SOB with minimal exertion and prefers to  sit, cannot lie down flat, speaks in phrases, mild retractions, audible wheezing, pulse 100-120.    - SEVERE: Very SOB at rest, speaks in single words, struggling to breathe, sitting hunched forward, retractions, pulse > 120      Shortness of breath with coughing  6. FEVER: "Do you have a fever?" If Yes, ask: "What is your temperature, how was it measured, and when did it start?"     No 7. CARDIAC HISTORY: "Do you have any history of heart disease?" (e.g., heart attack, congestive heart failure)      Yes 8. LUNG HISTORY: "Do you have any history of lung disease?"  (e.g., pulmonary embolus, asthma, emphysema)     No 9. PE RISK FACTORS: "Do you have a history of blood clots?" (or: recent major surgery, recent prolonged travel, bedridden)     No 10. OTHER SYMPTOMS: "Do you have any other symptoms?" (e.g., runny nose, wheezing, chest pain)       Loss of appetite, difficulty sleeping  Protocols used: Cough - Acute Productive-A-AH

## 2024-03-17 NOTE — Patient Instructions (Signed)
 If you feel any worse over the next 24 to 48 hours, then go to the emergency department.  If by Sunday afternoon you are not feeling any better at all proceed to the emergency department.

## 2024-03-17 NOTE — ED Notes (Signed)
 Patient transported to CT

## 2024-03-17 NOTE — Telephone Encounter (Signed)
 I was able to call and reach patient on telephone this afternoon regarding his lab results.  It does show AKI with GFR dropped from baseline of 30 down to 15.58 and creatinine has increased from 2.19 up to 3.59.  BUN also elevated at 49.  Patient's blood pressure was also soft at 96/54 while in the office today.  I have called him and recommended he proceed to the emergency department for emergent evaluation and treatment of AKI and possibly community-acquired pneumonia as well.  He reports his understanding and will call his daughter to transport him to hospital..

## 2024-03-17 NOTE — ED Provider Notes (Signed)
 [place] EMERGENCY DEPARTMENT AT [facility] Provider Note   CSN: [id number] Arrival date & time: 03/17/24  1911     History  Chief Complaint  Patient presents with   abnormal labs    [facility] is a 78 y.o. male with past medical history sniffing for hypertension, hyperlipidemia, GERD, CKD, multiple previous MI, PSVT, peripheral artery disease who presents with concern for cough, low blood pressure, AKI.  Creatinine around 3.9 from baseline around 2.  Has had cough, shortness of breath for around 1 week.  PCP sent here for AKI admission, and probable pneumonia.  HPI     Home Medications Prior to Admission medications   Medication Sig Start Date End Date Taking? Authorizing Provider  albuterol  (VENTOLIN  HFA) 108 (90 Base) MCG/ACT inhaler Inhale 2 puffs into the lungs every 4 (four) hours as needed for wheezing or shortness of breath. 03/17/24   Zorita Hiss, NP  benzonatate  (TESSALON ) 100 MG capsule Take 1 capsule (100 mg total) by mouth 2 (two) times daily as needed for cough. 03/17/24   Gray, Sarah E, NP  chlorthalidone (HYGROTON) 25 MG tablet Take 25 mg by mouth daily. 12/22/22   [provider]  clopidogrel  (PLAVIX ) 75 MG tablet Take 1 tablet (75 mg total) by mouth daily. 03/22/23 03/21/24  Adine Hoof, MD  doxycycline  (VIBRA -TABS) 100 MG tablet Take 1 tablet (100 mg total) by mouth 2 (two) times daily. 03/17/24   Zorita Hiss, NP  empagliflozin  (JARDIANCE ) 10 MG TABS tablet Take 1 tablet (10 mg total) by mouth daily before breakfast. 12/17/23   Chandrasekhar, Mahesh A, MD  ezetimibe  (ZETIA ) 10 MG tablet Take 1 tablet (10 mg total) by mouth daily. 06/15/23   Gloriann Larger A, MD  glucose blood (ONETOUCH VERIO) test strip Use to check blood sugar daily 12/30/18   Plotnikov, Aleksei V, MD  isosorbide  mononitrate (IMDUR ) 30 MG 24 hr tablet Take 1 tablet (30 mg total) by mouth daily. 03/05/23 12/08/23  Jann Melody, MD  memantine   (NAMENDA  XR) 14 MG CP24 24 hr capsule TAKE 1 CAPSULE BY MOUTH NIGHTLY 12/02/23   Wertman, Sara E, PA-C  nitroGLYCERIN  (NITROSTAT ) 0.4 MG SL tablet Place 1 tablet under tongue every 5 mins. DO NOT USE MORE THAN 3 TABS 03/05/23   Chandrasekhar, Mahesh A, MD  olmesartan (BENICAR) 40 MG tablet Take 40 mg by mouth daily.    [provider]  polyethylene glycol powder (GLYCOLAX /MIRALAX ) 17 GM/SCOOP powder Take 17 g by mouth 2 (two) times daily as needed for moderate constipation. 01/29/21   Plotnikov, Aleksei V, MD  rosuvastatin  (CRESTOR ) 20 MG tablet Take 1 tablet (20 mg total) by mouth daily. 09/23/23 12/22/23  Plotnikov, Oakley Bellman, MD  sertraline  (ZOLOFT ) 25 MG tablet Take 1 tablet (25 mg total) by mouth daily. 07/19/23   Wertman, Sara E, PA-C  traZODone  (DESYREL ) 50 MG tablet TAKE 1/4 TO 1/2 TABLET BY MOUTH AT NIGHT AS NEEDED FOR INSOMNIA 12/31/23   Wertman, Sara E, PA-C      Allergies    Egg-derived products, Aricept  [donepezil ], and Coreg  [carvedilol ]    Review of Systems   Review of Systems  Respiratory:  Positive for cough and shortness of breath.   All other systems reviewed and are negative.   Physical Exam Updated Vital Signs BP (!) 101/53   Pulse 76   Temp 98.3 F (36.8 C)   Resp (!) 23   Ht 5\' 5"  (1.651 m)   Wt 69.4  kg   SpO2 96%   BMI 25.46 kg/m  Physical Exam Vitals and nursing note reviewed.  Constitutional:      General: He is not in acute distress.    Appearance: Normal appearance. He is ill-appearing.  HENT:     Head: Normocephalic and atraumatic.  Eyes:     General:        Right eye: No discharge.        Left eye: No discharge.  Cardiovascular:     Rate and Rhythm: Normal rate and regular rhythm.     Heart sounds: No murmur heard.    No friction rub. No gallop.  Pulmonary:     Comments: Increased respiratory effort, some wheezing, coarse rhonchi throughout Abdominal:     General: Bowel sounds are normal.     Palpations: Abdomen is soft.  Skin:     General: Skin is warm and dry.     Capillary Refill: Capillary refill takes less than 2 seconds.  Neurological:     Mental Status: He is alert and oriented to person, place, and time.  Psychiatric:        Mood and Affect: Mood normal.        Behavior: Behavior normal.     ED Results / Procedures / Treatments   Labs (all labs ordered are listed, but only abnormal results are displayed) Labs Reviewed  CBC - Abnormal; Notable for the following components:      Result Value   RBC 3.96 (*)    Hemoglobin 11.8 (*)    HCT 36.9 (*)    All other components within normal limits  BASIC METABOLIC PANEL WITH GFR - Abnormal; Notable for the following components:   Sodium 134 (*)    CO2 18 (*)    Glucose, Bld 122 (*)    BUN 54 (*)    Creatinine, Ser 3.93 (*)    Calcium  8.8 (*)    GFR, Estimated 15 (*)    Anion gap 16 (*)    All other components within normal limits  CULTURE, BLOOD (ROUTINE X 2)  CULTURE, BLOOD (ROUTINE X 2)  I-STAT CG4 LACTIC ACID, ED  TROPONIN I (HIGH SENSITIVITY)  TROPONIN I (HIGH SENSITIVITY)    EKG None  Radiology CT CHEST WO CONTRAST Result Date: 03/17/2024 CLINICAL DATA:  Abnormal chest x-ray EXAM: CT CHEST WITHOUT CONTRAST TECHNIQUE: Multidetector CT imaging of the chest was performed following the standard protocol without IV contrast. RADIATION DOSE REDUCTION: This exam was performed according to the departmental dose-optimization program which includes automated exposure control, adjustment of the mA and/or kV according to patient size and/or use of iterative reconstruction technique. COMPARISON:  Chest x-ray from earlier in the same day, CT from 07/14/23 FINDINGS: Cardiovascular: Limited due to lack of IV contrast. Atherosclerotic calcifications of the aorta are noted without aneurysmal dilatation. Diffuse coronary calcifications are noted. No cardiac enlargement is seen. Mediastinum/Nodes: The thoracic inlet is within normal limits. No hilar or mediastinal  adenopathy is noted. The esophagus as visualized is within normal limits. Lungs/Pleura: Lungs are well aerated bilaterally. Bronchial wall thickening is noted diffusely in the lower lobes bilaterally with associated focal infiltrate in the posterior lower lobes bilaterally. These changes could be related to aspiration but more likely secondary to multifocal pneumonia. These are new from the prior PET-CT but similar in appearance to that on current chest x-ray. No parenchymal nodules are seen. No effusion is noted. Upper Abdomen: Visualized upper abdomen is within normal limits. Musculoskeletal: Degenerative changes  of the thoracic spine are noted. No acute rib seen. IMPRESSION: Bibasilar infiltrates consistent with multifocal pneumonia. No other acute abnormality is noted. Aortic Atherosclerosis (ICD10-I70.0). Electronically Signed   By: Violeta Grey M.D.   On: 03/17/2024 22:00   DG Chest 2 View Result Date: 03/17/2024 CLINICAL DATA:  Cough. EXAM: CHEST - 2 VIEW COMPARISON:  Chest radiograph dated 08/02/2023. FINDINGS: Left lung base density similar to prior radiograph may represent chronic atelectasis/scarring. Recurrent pneumonia is not excluded. The right lung is clear. No pleural effusion pneumothorax. Stable cardiac silhouette no acute osseous pathology. IMPRESSION: Left lung base density may represent chronic atelectasis/scarring. Recurrent pneumonia is not excluded. Electronically Signed   By: Angus Bark M.D.   On: 03/17/2024 14:35    Procedures .Critical Care  Performed by: Nelly Banco, PA-C Authorized by: Nelly Banco, PA-C   Critical care provider statement:    Critical care time (minutes):  35   Critical care was necessary to treat or prevent imminent or life-threatening deterioration of the following conditions:  Shock and respiratory failure   Critical care was time spent personally by me on the following activities:  Development of treatment plan with patient or  surrogate, discussions with consultants, evaluation of patient's response to treatment, examination of patient, ordering and review of laboratory studies, ordering and review of radiographic studies, ordering and performing treatments and interventions, pulse oximetry, re-evaluation of patient's condition and review of old charts   Care discussed with: admitting provider       Medications Ordered in ED Medications  azithromycin  (ZITHROMAX ) 500 mg in sodium chloride  0.9 % 250 mL IVPB (500 mg Intravenous New Bag/Given 03/17/24 2212)  sodium chloride  0.9 % bolus 1,000 mL (1,000 mLs Intravenous New Bag/Given 03/17/24 2050)  cefTRIAXone  (ROCEPHIN ) 1 g in sodium chloride  0.9 % 100 mL IVPB (0 g Intravenous Stopped 03/17/24 2208)  ipratropium-albuterol  (DUONEB) 0.5-2.5 (3) MG/3ML nebulizer solution 3 mL (3 mLs Nebulization Given 03/17/24 2157)    ED Course/ Medical Decision Making/ A&P                                 Medical Decision Making Amount and/or Complexity of Data Reviewed Labs: ordered. Radiology: ordered.  Risk Prescription drug management.   This patient is a 78 y.o. male  who presents to the ED for concern of shob, weakness.   Differential diagnoses prior to evaluation: The emergent differential diagnosis includes, but is not limited to,  asthma exacerbation, COPD exacerbation, acute upper respiratory infection, acute bronchitis, chronic bronchitis, interstitial lung disease, ARDS, PE, pneumonia, atypical ACS, carbon monoxide poisoning, spontaneous pneumothorax, new CHF vs CHF exacerbation, versus other, CVA, spinal cord injury, ACS, arrhythmia, syncope, orthostatic hypotension, sepsis, hypoglycemia, hypoxia, electrolyte disturbance, endocrine disorder, anemia, environmental exposure, polypharmacy . This is not an exhaustive differential.   Past Medical History / Co-morbidities / Social History: hypertension, hyperlipidemia, GERD, CKD, multiple previous MI, PSVT, peripheral artery  disease  Additional history: Chart reviewed. Pertinent results include: Reviewed lab work prior to arrival with AKI, and plain film chest x-ray concerning for pneumonia  Physical Exam: Physical exam performed. The pertinent findings include: Quite hypotensive on arrival, blood pressure 82/45, improved after some fluid resuscitation although still somewhat soft, he has had some tachypnea, increased work of breathing, wheezing, rhonchi.  Normal oxygen saturation on room air and no fever however.    Lab Tests/Imaging studies: I personally interpreted labs/imaging and the pertinent results  include:  CBC with mild anemia but no significant leukocytosis, hemoglobin 11.8, BMP with mild hyponatremia, sodium 134, bicarb deficit, CO2 18 with anion gap of 16, he has significant AKI, BUN 54, creatinine 3.93.  Normal lactic acid, initial troponin normal in context of some nonspecific chest pain.  CT chest shows atypical multifocal pneumonia I agree with the radiologist interpretation.  Cardiac monitoring: EKG obtained and interpreted by myself and attending physician which shows: NSR, no acute st-t changes   Medications: I ordered medication including fluid bolus, Rocephin , azithromycin , DuoNeb, work of breathing improved, antibiotics given for pneumonia, patient will require admission for AKI and multifocal pneumonia.  I have reviewed the patients home medicines and have made adjustments as needed.   Disposition: After consideration of the diagnostic results and the patients response to treatment, I feel that would benefit from admission as discussed above.   Consults: I spoke with the hospitalist, Dr Del Favia who agrees to admission for multifocal pneumonia and AKI as discussed.  Final Clinical Impression(s) / ED Diagnoses Final diagnoses:  Multifocal pneumonia  AKI (acute kidney injury) [facility])    Rx / DC Orders ED Discharge Orders     None         Nelly Banco, PA-C 03/17/24 2245     Lowery Rue, DO 03/17/24 2248

## 2024-03-17 NOTE — Telephone Encounter (Signed)
 Please call patient and schedule him to be seen by an available provider at [facility] on 03/20/24 or 03/21/24 for close follow-up of community acquired pneumonia.   He was advised to do this before he left the office, but it does not appear that he stopped at the front to schedule a follow-up.

## 2024-03-17 NOTE — Progress Notes (Signed)
 Established Patient Office Visit  Subjective   Patient ID: Harold Waters, male    DOB: 03-10-1946  Age: 78 y.o. MRN: [id number]  Chief Complaint  Patient presents with   Cough    Harold Waters arrives today for acute visit.  He is 78 y/o male with pmh of GERD, MI, sleep apnea, Hyperlipidemia, hypertension, CAD, DVT in 2010, diverticulosis, gastritis, osteoarthritis, PAD.  He reports 1 week history of cough and bodyaches.  Cough is productive, has fatigue and anorexia as well.  Feels that he is wheezing also experiencing dizziness and headaches.  Denies any underlying respiratory disease such as COPD or asthma.    Review of Systems  Constitutional:  Positive for malaise/fatigue. Negative for chills and fever.       (+) loss of appetitie  Respiratory:  Positive for cough, sputum production and wheezing. Negative for shortness of breath.   Cardiovascular:  Negative for chest pain and palpitations.  Neurological:  Positive for dizziness and headaches.      Objective:     BP (!) 96/54   Pulse 85   Temp 99 F (37.2 C) (Temporal)   Wt 153 lb (69.4 kg)   SpO2 94%   BMI 25.46 kg/m  BP Readings from Last 3 Encounters:  03/17/24 (!) 96/54  12/08/23 108/60  12/01/23 (!) 123/54   Wt Readings from Last 3 Encounters:  03/17/24 153 lb (69.4 kg)  12/08/23 159 lb (72.1 kg)  12/01/23 158 lb (71.7 kg)      Physical Exam Vitals reviewed.  Constitutional:      Appearance: Normal appearance.  HENT:     Head: Normocephalic and atraumatic.  Cardiovascular:     Rate and Rhythm: Normal rate and regular rhythm.  Pulmonary:     Effort: Pulmonary effort is normal.     Breath sounds: Wheezing and rhonchi present.  Musculoskeletal:     Cervical back: Neck supple.  Skin:    General: Skin is warm and dry.  Neurological:     Mental Status: He is alert and oriented to person, place, and time.  Psychiatric:        Mood and Affect: Mood normal.        Behavior: Behavior normal.         Thought Content: Thought content normal.        Judgment: Judgment normal.      Results for orders placed or performed in visit on 03/17/24  POCT rapid strep A  Result Value Ref Range   Rapid Strep A Screen Negative Negative  POC COVID-19 BinaxNow  Result Value Ref Range   SARS Coronavirus 2 Ag Negative Negative  POCT Influenza A/B  Result Value Ref Range   Influenza A, POC Negative Negative   Influenza B, POC Negative Negative  POCT respiratory syncytial virus  Result Value Ref Range   RSV Rapid Ag negative       The ASCVD Risk score (Arnett DK, et al., 2019) failed to calculate for the following reasons:   Risk score cannot be calculated because patient has a medical history suggesting prior/existing ASCVD    Assessment & Plan:   Problem List Items Addressed This Visit       Respiratory   Community acquired pneumonia - Primary   Acute, concern for community-acquired pneumonia Check CBC with differential, CMP, stat chest x-ray Point-of-care COVID, RSV, flu, strep: Negative Treat with doxycycline  100 mg twice daily x 10 days, albuterol  inhaler every 4 hours as needed for  wheezing, Tessalon  Perles 100 mg twice daily as needed for cough suppression. Follow-up Monday or Tuesday for close monitoring. Discussed that if symptoms worsen at all in 24 to 48 hours patient should proceed to the emergency department, also discussed that if symptoms do not improve within the next 48 hours he should proceed to the emergency department.      Relevant Medications   doxycycline  (VIBRA -TABS) 100 MG tablet   albuterol  (VENTOLIN  HFA) 108 (90 Base) MCG/ACT inhaler   benzonatate  (TESSALON ) 100 MG capsule   Other Relevant Orders   CBC with Differential/Platelet   Comprehensive metabolic panel with GFR   DG Chest 2 View   POCT rapid strep A (Completed)   POC COVID-19 BinaxNow (Completed)   POCT Influenza A/B (Completed)   POCT respiratory syncytial virus (Completed)    Return in  about 3 days (around 03/20/2024) for F/U with provder that has opening on MON or TUES of next week for close f/u CAP.    Zorita Hiss, NP

## 2024-03-17 NOTE — H&P (Addendum)
 History and Physical  Harold Waters ZOX:[id number] DOB: 16-Aug-1946 DOA: 03/17/2024  Referring physician: Earnie Gola, PA-EDP PCP: Genia Kettering, MD  Outpatient Specialists: Cardiology, pulmonary, vascular surgery, neurology. Patient coming from: Home through his PCPs office.  Chief Complaint: Persistent cough  HPI: Harold Waters is a 78 y.o. male with medical history significant for type 2 diabetes, hypertension, CKD 3B, coronary artery disease, peripheral artery disease status post left common femoral to above-knee popliteal artery bypass with vein by Dr. Vikki Graves in 2020, had some persistent left lower extremity edema, managed with compression stockings, memory impairment on memantine , mild to moderate nonrheumatic aortic valve disease, who initially presented to his primary care provider due to persistent cough where he was diagnosed with community-acquired pneumonia and prescribed p.o. doxycycline  100 mg twice daily x 10 days, albuterol  inhaler as needed, and Tessalon  Perles as needed.  Labs were obtained.  The patient was called from home with the results of his lab work which showed elevation in creatinine above baseline with concern for AKI on CKD 3B.  The patient was advised to go to the ER for further evaluation.  In the ER, hypotensive, responded well to IV fluid bolus.  CT chest without contrast revealed multifocal infiltrates suggestive of pneumonia.  The patient was started on Rocephin  and azithromycin  empirically for CAP.  Also received IV fluid for creatinine of 3.93.  Baseline creatinine of 2.0.  TRH, hospitalist service, was asked to admit.  ED Course: Temperature 99.  BP 82/43 with MAP of 56, pulse rate 79.  Respiration rate 18, O2 saturation 92% on room air.  Lab studies notable for serum sodium 134, serum bicarb 18, glucose 122, BUN 54, creatinine 3.93, anion gap 16.  GFR 15.  Hemoglobin 11.8.  WBC 6.2, platelet count 188.  Review of Systems: Review of  systems as noted in the HPI. All other systems reviewed and are negative.   Past Medical History:  Diagnosis Date   Angina    Chronic stomach ulcer    "get them off and on" (09/19/2018)   Coronary atherosclerosis of unspecified type of vessel, native or graft    Diverticulosis    DVT of lower extremity (deep venous thrombosis) (HCC) ~ 2010   LLE   Erosive gastritis    Essential hypertension, benign    GERD (gastroesophageal reflux disease)    Headache(784.0) 02/18/12   "lately"   History of kidney stones    Internal hemorrhoids    Lower back pain    Myocardial infarction (HCC) 1997   Osteoarthritis    Other and unspecified hyperlipidemia    PAD (peripheral artery disease) (HCC)    with ABI's 0.8 on the right and 0.86 on the left   Pneumonia 1957   PSVT (paroxysmal supraventricular tachycardia) (HCC) 02/18/12   Shortness of breath    "lying down"   Sleep apnea    does not wear c-pap; pt does not recall this hx on 09/19/2018   Past Surgical History:  Procedure Laterality Date   ABDOMINAL AORTOGRAM W/LOWER EXTREMITY N/A 03/22/2023   Procedure: ABDOMINAL AORTOGRAM W/LOWER EXTREMITY;  Surgeon: Adine Hoof, MD;  Location: [facility] INVASIVE CV LAB;  Service: Cardiovascular;  Laterality: N/A;   ARTHROSCOPY KNEE W/ DRILLING Right ~ 2001   BYPASS GRAFT Left 12/05/2018   femoral popliteal    CARDIAC CATHETERIZATION  09/19/2018   CATARACT EXTRACTION     COLONOSCOPY     CORONARY ANGIOPLASTY WITH STENT PLACEMENT  1997   "2"  CORONARY PRESSURE/FFR STUDY  09/19/2018   CORONARY PRESSURE/FFR STUDY N/A 09/19/2018   Procedure: INTRAVASCULAR PRESSURE WIRE/FFR STUDY;  Surgeon: Avanell Leigh, MD;  Location: [facility] INVASIVE CV LAB;  Service: Cardiovascular;  Laterality: N/A;   FEMORAL-POPLITEAL BYPASS GRAFT Left 12/06/2018   Procedure: BYPASS GRAFT FEMORAL-POPLITEAL ARTERY;  Surgeon: Adine Hoof, MD;  Location: [facility] OR;  Service: Vascular;  Laterality: Left;   ILIAC ARTERY  STENT  04/22/2017   . Placement of a 6 mm x 100 mm Viabahn covered stent left SFA   LEFT HEART CATH AND CORONARY ANGIOGRAPHY N/A 09/19/2018   Procedure: LEFT HEART CATH AND CORONARY ANGIOGRAPHY;  Surgeon: Avanell Leigh, MD;  Location: [facility] INVASIVE CV LAB;  Service: Cardiovascular;  Laterality: N/A;   LEFT HEART CATHETERIZATION WITH CORONARY ANGIOGRAM N/A 02/19/2012   Procedure: LEFT HEART CATHETERIZATION WITH CORONARY ANGIOGRAM;  Surgeon: Darlis Eisenmenger, MD;  Location: [facility] CATH LAB;  Service: Cardiovascular;  Laterality: N/A;   LOWER EXTREMITY ANGIOGRAPHY Bilateral 04/22/2017   Procedure: Lower Extremity Angiography;  Surgeon: Avanell Leigh, MD;  Location: [facility] INVASIVE CV LAB;  Service: Cardiovascular;  Laterality: Bilateral;   LOWER EXTREMITY ANGIOGRAPHY Bilateral 09/19/2018   LOWER EXTREMITY ANGIOGRAPHY Bilateral 09/19/2018   Procedure: LOWER EXTREMITY ANGIOGRAPHY;  Surgeon: Avanell Leigh, MD;  Location: [facility] INVASIVE CV LAB;  Service: Cardiovascular;  Laterality: Bilateral;   PERIPHERAL ARTERIAL STENT GRAFT  09/2010   LLE   PERIPHERAL VASCULAR ATHERECTOMY Left 04/22/2017   Procedure: Peripheral Vascular Atherectomy;  Surgeon: Avanell Leigh, MD;  Location: [facility] INVASIVE CV LAB;  Service: Cardiovascular;  Laterality: Left;  SFA   PERIPHERAL VASCULAR ATHERECTOMY  03/22/2023   Procedure: PERIPHERAL VASCULAR ATHERECTOMY;  Surgeon: Adine Hoof, MD;  Location: [facility] INVASIVE CV LAB;  Service: Cardiovascular;;   PERIPHERAL VASCULAR INTERVENTION Left 04/22/2017   Procedure: Peripheral Vascular Intervention;  Surgeon: Avanell Leigh, MD;  Location: [facility] INVASIVE CV LAB;  Service: Cardiovascular;  Laterality: Left;  SFA   PERIPHERAL VASCULAR INTERVENTION  03/22/2023   Procedure: PERIPHERAL VASCULAR INTERVENTION;  Surgeon: Adine Hoof, MD;  Location: [facility] INVASIVE CV LAB;  Service: Cardiovascular;;   UPPER GASTROINTESTINAL ENDOSCOPY      Social History:  reports that he  quit smoking about 13 years ago. His smoking use included cigarettes. He started smoking about 53 years ago. He has a 40 pack-year smoking history. He has never been exposed to tobacco smoke. He has never used smokeless tobacco. He reports that he does not currently use alcohol. He reports that he does not use drugs.   Allergies  Allergen Reactions   Egg-Derived Products Itching   Aricept  [Donepezil ] Nausea And Vomiting   Coreg  [Carvedilol ] Other (See Comments)    Fatigue    Family History  Problem Relation Age of Onset   Ulcers Father        had stomach issues, not sure what happened   Coronary artery disease Brother        male 1st degree relative <50   Colon cancer Neg Hx    Esophageal cancer Neg Hx    Rectal cancer Neg Hx    Stomach cancer Neg Hx       Prior to Admission medications   Medication Sig Start Date End Date Taking? Authorizing Provider  albuterol  (VENTOLIN  HFA) 108 (90 Base) MCG/ACT inhaler Inhale 2 puffs into the lungs every 4 (four) hours as needed for wheezing or shortness of breath. 03/17/24   Zorita Hiss, NP  benzonatate  (TESSALON )  100 MG capsule Take 1 capsule (100 mg total) by mouth 2 (two) times daily as needed for cough. 03/17/24   Gray, Sarah E, NP  chlorthalidone (HYGROTON) 25 MG tablet Take 25 mg by mouth daily. 12/22/22   [provider]  clopidogrel  (PLAVIX ) 75 MG tablet Take 1 tablet (75 mg total) by mouth daily. 03/22/23 03/21/24  Adine Hoof, MD  doxycycline  (VIBRA -TABS) 100 MG tablet Take 1 tablet (100 mg total) by mouth 2 (two) times daily. 03/17/24   Zorita Hiss, NP  empagliflozin  (JARDIANCE ) 10 MG TABS tablet Take 1 tablet (10 mg total) by mouth daily before breakfast. 12/17/23   Chandrasekhar, Mahesh A, MD  ezetimibe  (ZETIA ) 10 MG tablet Take 1 tablet (10 mg total) by mouth daily. 06/15/23   Gloriann Larger A, MD  glucose blood (ONETOUCH VERIO) test strip Use to check blood sugar daily 12/30/18   Plotnikov, Aleksei V, MD   isosorbide  mononitrate (IMDUR ) 30 MG 24 hr tablet Take 1 tablet (30 mg total) by mouth daily. 03/05/23 12/08/23  Jann Melody, MD  memantine  (NAMENDA  XR) 14 MG CP24 24 hr capsule TAKE 1 CAPSULE BY MOUTH NIGHTLY 12/02/23   Wertman, Sara E, PA-C  nitroGLYCERIN  (NITROSTAT ) 0.4 MG SL tablet Place 1 tablet under tongue every 5 mins. DO NOT USE MORE THAN 3 TABS 03/05/23   Chandrasekhar, Mahesh A, MD  olmesartan (BENICAR) 40 MG tablet Take 40 mg by mouth daily.    [provider]  polyethylene glycol powder (GLYCOLAX /MIRALAX ) 17 GM/SCOOP powder Take 17 g by mouth 2 (two) times daily as needed for moderate constipation. 01/29/21   Plotnikov, Aleksei V, MD  rosuvastatin  (CRESTOR ) 20 MG tablet Take 1 tablet (20 mg total) by mouth daily. 09/23/23 12/22/23  Plotnikov, Oakley Bellman, MD  sertraline  (ZOLOFT ) 25 MG tablet Take 1 tablet (25 mg total) by mouth daily. 07/19/23   Wertman, Sara E, PA-C  traZODone  (DESYREL ) 50 MG tablet TAKE 1/4 TO 1/2 TABLET BY MOUTH AT NIGHT AS NEEDED FOR INSOMNIA 12/31/23   Rosi Converse, PA-C    Physical Exam: BP (!) 101/53   Pulse 76   Temp 98.3 F (36.8 C)   Resp (!) 23   Ht 5\' 5"  (1.651 m)   Wt 69.4 kg   SpO2 96%   BMI 25.46 kg/m   General: 78 y.o. year-old male well developed well nourished in no acute distress.  Alert and oriented x3. Cardiovascular: Regular rate and rhythm with no rubs or gallops.  No thyromegaly or JVD noted.  No lower extremity edema. 2/4 pulses in all 4 extremities. Respiratory: Diffuse rales and wheezing but.  Poor inspiratory effort. Abdomen: Soft nontender nondistended with normal bowel sounds x4 quadrants. Muskuloskeletal: No cyanosis, clubbing or edema noted bilaterally Neuro: CN II-XII intact, strength, sensation, reflexes Skin: No ulcerative lesions noted or rashes Psychiatry: Judgement and insight appear normal. Mood is appropriate for condition and setting          Labs on Admission:  Basic Metabolic Panel: Recent Labs   Lab 03/17/24 1429 03/17/24 2045  NA 138 134*  K 4.4 4.2  CL 103 100  CO2 26 18*  GLUCOSE 159* 122*  BUN 49* 54*  CREATININE 3.59* 3.93*  CALCIUM  9.1 8.8*   Liver Function Tests: Recent Labs  Lab 03/17/24 1429  AST 14  ALT 23  ALKPHOS 52  BILITOT 0.8  PROT 7.3  ALBUMIN 4.3   No results for input(s): "LIPASE", "AMYLASE" in the last 168 hours. No  results for input(s): "AMMONIA" in the last 168 hours. CBC: Recent Labs  Lab 03/17/24 1429 03/17/24 2045  WBC 6.5 6.2  NEUTROABS 4.6  --   HGB 11.8* 11.8*  HCT 35.9* 36.9*  MCV 90.4 93.2  PLT 191.0 188   Cardiac Enzymes: No results for input(s): "CKTOTAL", "CKMB", "CKMBINDEX", "TROPONINI" in the last 168 hours.  BNP (last 3 results) No results for input(s): "BNP" in the last 8760 hours.  ProBNP (last 3 results) Recent Labs    07/19/23 1219  PROBNP 141    CBG: No results for input(s): "GLUCAP" in the last 168 hours.  Radiological Exams on Admission: CT CHEST WO CONTRAST Result Date: 03/17/2024 CLINICAL DATA:  Abnormal chest x-ray EXAM: CT CHEST WITHOUT CONTRAST TECHNIQUE: Multidetector CT imaging of the chest was performed following the standard protocol without IV contrast. RADIATION DOSE REDUCTION: This exam was performed according to the departmental dose-optimization program which includes automated exposure control, adjustment of the mA and/or kV according to patient size and/or use of iterative reconstruction technique. COMPARISON:  Chest x-ray from earlier in the same day, CT from 07/14/23 FINDINGS: Cardiovascular: Limited due to lack of IV contrast. Atherosclerotic calcifications of the aorta are noted without aneurysmal dilatation. Diffuse coronary calcifications are noted. No cardiac enlargement is seen. Mediastinum/Nodes: The thoracic inlet is within normal limits. No hilar or mediastinal adenopathy is noted. The esophagus as visualized is within normal limits. Lungs/Pleura: Lungs are well aerated bilaterally.  Bronchial wall thickening is noted diffusely in the lower lobes bilaterally with associated focal infiltrate in the posterior lower lobes bilaterally. These changes could be related to aspiration but more likely secondary to multifocal pneumonia. These are new from the prior PET-CT but similar in appearance to that on current chest x-ray. No parenchymal nodules are seen. No effusion is noted. Upper Abdomen: Visualized upper abdomen is within normal limits. Musculoskeletal: Degenerative changes of the thoracic spine are noted. No acute rib seen. IMPRESSION: Bibasilar infiltrates consistent with multifocal pneumonia. No other acute abnormality is noted. Aortic Atherosclerosis (ICD10-I70.0). Electronically Signed   By: Violeta Grey M.D.   On: 03/17/2024 22:00   DG Chest 2 View Result Date: 03/17/2024 CLINICAL DATA:  Cough. EXAM: CHEST - 2 VIEW COMPARISON:  Chest radiograph dated 08/02/2023. FINDINGS: Left lung base density similar to prior radiograph may represent chronic atelectasis/scarring. Recurrent pneumonia is not excluded. The right lung is clear. No pleural effusion pneumothorax. Stable cardiac silhouette no acute osseous pathology. IMPRESSION: Left lung base density may represent chronic atelectasis/scarring. Recurrent pneumonia is not excluded. Electronically Signed   By: Angus Bark M.D.   On: 03/17/2024 14:35    EKG: I independently viewed the EKG done and my findings are as followed: Sinus rhythm rate of 74.  Nonspecific EKG QTc 449.  Assessment/Plan Present on Admission:  CAP (community acquired pneumonia)  Active Problems:   CAP (community acquired pneumonia)  Multifocal pneumonia, POA Continue Rocephin  and azithromycin  initiated in the ER Received 1 dose of IV Solu-Medrol  due to wheezing Bronchodilators Antitussives as needed Pulmonary toilet Early mobilization with fall precautions Baseline procalcitonin Maintain O2 saturation above 92% Repeat CBC in the morning  AKI on  CKD 3B, prerenal in the setting of poor oral intake Decreased appetite and poor oral intake for the past few days. Baseline creatinine appears to be 2.0 with GFR of 31 Presented with creatinine of 3.93 with GFR 15. Avoid nephrotoxic agents, dehydration, and hypotension Hold off home chlorthalidone and olmesartan Gentle IV fluid hydration Monitor urine output  Repeat BMP in the morning. Follow UA  High anion gap metabolic acidosis Serum bicarb 18, anion gap 16 Continue to treat underlying conditions. Repeat BMP in the morning  Type 2 diabetes with hyperglycemia Last hemoglobin A1c 7.5 on 09/24/2023 Repeat hemoglobin A1c Start insulin  sliding scale Heart healthy carb modified diet  Mild hypovolemic hyponatremia Presented with serum sodium 134 Continue IV fluid NS at 75 cc/h x 1 day  Hypertension, currently with soft Bps Hold off home Imdur , chlorthalidone, and olmesartan. Continue to closely monitor vital signs. Maintain MAP greater than 65  Coronary artery disease/peripheral artery disease Follows with vascular surgery and cardiology Resume home clopidogrel , Zetia , and Crestor .  Hyperlipidemia Resume home Crestor   Memory impairment Chronic anxiety/depression Currently alert and oriented x 3 Resume home regimen memantine  and Zoloft    Critical care time: 65 minutes.   DVT prophylaxis: Subcu Lovenox  daily.  Code Status: Full code.  Family Communication: None at bedside.  Disposition Plan: Admitted to telemetry medical unit.  Consults called: None.  Admission status: Inpatient status.   Status is: Inpatient The patient requires at least 2 midnights for further evaluation and treatment of present condition.   Bary Boss MD [facility] Pager [phone]  If 7PM-7AM, please contact night-coverage [email or web address] Password [facility]  03/17/2024, 10:49 PM

## 2024-03-17 NOTE — ED Triage Notes (Signed)
 The pt has had a cough for one week.  He saw his doctor earlier today and had blood work drawn  he was called back 1800 and was told that hid blood work showed a kidney problem and to come to the ed. His bp and become low and he had a fleeting moment of chest pain while in the waiting room

## 2024-03-18 ENCOUNTER — Other Ambulatory Visit: Payer: Self-pay

## 2024-03-18 ENCOUNTER — Encounter (HOSPITAL_COMMUNITY): Payer: Self-pay | Admitting: Internal Medicine

## 2024-03-18 DIAGNOSIS — N179 Acute kidney failure, unspecified: Secondary | ICD-10-CM

## 2024-03-18 DIAGNOSIS — I959 Hypotension, unspecified: Secondary | ICD-10-CM

## 2024-03-18 DIAGNOSIS — J189 Pneumonia, unspecified organism: Secondary | ICD-10-CM | POA: Diagnosis not present

## 2024-03-18 HISTORY — DX: Acute kidney failure, unspecified: N17.9

## 2024-03-18 HISTORY — DX: Hypotension, unspecified: I95.9

## 2024-03-18 LAB — BASIC METABOLIC PANEL WITH GFR
Anion gap: 12 (ref 5–15)
BUN: 50 mg/dL — ABNORMAL HIGH (ref 8–23)
CO2: 19 mmol/L — ABNORMAL LOW (ref 22–32)
Calcium: 8.4 mg/dL — ABNORMAL LOW (ref 8.9–10.3)
Chloride: 108 mmol/L (ref 98–111)
Creatinine, Ser: 3.07 mg/dL — ABNORMAL HIGH (ref 0.61–1.24)
GFR, Estimated: 20 mL/min — ABNORMAL LOW (ref >60)
Glucose, Bld: 176 mg/dL — ABNORMAL HIGH (ref 70–99)
Potassium: 4.3 mmol/L (ref 3.5–5.1)
Sodium: 139 mmol/L (ref 135–145)

## 2024-03-18 LAB — EXPECTORATED SPUTUM ASSESSMENT W GRAM STAIN, RFLX TO RESP C

## 2024-03-18 LAB — GLUCOSE, CAPILLARY
Glucose-Capillary: 105 mg/dL — ABNORMAL HIGH (ref 70–99)
Glucose-Capillary: 129 mg/dL — ABNORMAL HIGH (ref 70–99)
Glucose-Capillary: 138 mg/dL — ABNORMAL HIGH (ref 70–99)
Glucose-Capillary: 170 mg/dL — ABNORMAL HIGH (ref 70–99)
Glucose-Capillary: 185 mg/dL — ABNORMAL HIGH (ref 70–99)

## 2024-03-18 LAB — MAGNESIUM: Magnesium: 2.2 mg/dL (ref 1.7–2.4)

## 2024-03-18 LAB — CBC
HCT: 34.3 % — ABNORMAL LOW (ref 39.0–52.0)
Hemoglobin: 10.8 g/dL — ABNORMAL LOW (ref 13.0–17.0)
MCH: 29.3 pg (ref 26.0–34.0)
MCHC: 31.5 g/dL (ref 30.0–36.0)
MCV: 93.2 fL (ref 80.0–100.0)
Platelets: 187 10*3/uL (ref 150–400)
RBC: 3.68 MIL/uL — ABNORMAL LOW (ref 4.22–5.81)
RDW: 13.5 % (ref 11.5–15.5)
WBC: 5.4 10*3/uL (ref 4.0–10.5)
nRBC: 0 % (ref 0.0–0.2)

## 2024-03-18 LAB — PHOSPHORUS: Phosphorus: 4 mg/dL (ref 2.5–4.6)

## 2024-03-18 LAB — PROCALCITONIN: Procalcitonin: 0.25 ng/mL

## 2024-03-18 LAB — TSH: TSH: 2.246 u[IU]/mL (ref 0.350–4.500)

## 2024-03-18 LAB — CORTISOL: Cortisol, Plasma: 11.7 ug/dL

## 2024-03-18 MED ORDER — ORAL CARE MOUTH RINSE: 15.0000 mL | OROMUCOSAL | Status: DC | PRN

## 2024-03-18 MED ORDER — GUAIFENESIN ER 600 MG PO TB12
600.0000 mg | ORAL_TABLET | Freq: Two times a day (BID) | ORAL | Status: DC
  Administered 2024-03-18 – 2024-03-19 (×4): 600 mg via ORAL
  Filled 2024-03-18 (×4): qty 1

## 2024-03-18 MED ORDER — METHYLPREDNISOLONE SODIUM SUCC 40 MG IJ SOLR
40.0000 mg | INTRAMUSCULAR | Status: AC
  Administered 2024-03-18: 40 mg via INTRAVENOUS
  Filled 2024-03-18: qty 1

## 2024-03-18 MED ORDER — SERTRALINE HCL 50 MG PO TABS
25.0000 mg | ORAL_TABLET | Freq: Every day | ORAL | Status: DC
  Administered 2024-03-18 – 2024-03-19 (×2): 25 mg via ORAL
  Filled 2024-03-18 (×2): qty 1

## 2024-03-18 MED ORDER — IPRATROPIUM-ALBUTEROL 0.5-2.5 (3) MG/3ML IN SOLN
3.0000 mL | RESPIRATORY_TRACT | Status: AC
  Filled 2024-03-18: qty 3

## 2024-03-18 MED ORDER — TRAZODONE HCL 50 MG PO TABS
25.0000 mg | ORAL_TABLET | Freq: Every day | ORAL | Status: DC
  Administered 2024-03-18: 25 mg via ORAL
  Filled 2024-03-18: qty 1

## 2024-03-18 MED ORDER — INSULIN GLARGINE-YFGN 100 UNIT/ML ~~LOC~~ SOLN
10.0000 [IU] | Freq: Every day | SUBCUTANEOUS | Status: DC
  Administered 2024-03-18 – 2024-03-19 (×2): 10 [IU] via SUBCUTANEOUS
  Filled 2024-03-18 (×2): qty 0.1

## 2024-03-18 MED ORDER — SODIUM CHLORIDE 3 % IN NEBU
4.0000 mL | INHALATION_SOLUTION | Freq: Two times a day (BID) | RESPIRATORY_TRACT | Status: DC
  Administered 2024-03-18 – 2024-03-19 (×3): 4 mL via RESPIRATORY_TRACT
  Filled 2024-03-18 (×3): qty 4

## 2024-03-18 MED ORDER — MEMANTINE HCL ER 14 MG PO CP24
14.0000 mg | ORAL_CAPSULE | Freq: Every day | ORAL | Status: DC
  Administered 2024-03-18: 14 mg via ORAL
  Filled 2024-03-18: qty 1

## 2024-03-18 MED ORDER — IPRATROPIUM-ALBUTEROL 0.5-2.5 (3) MG/3ML IN SOLN
3.0000 mL | Freq: Two times a day (BID) | RESPIRATORY_TRACT | Status: DC
  Administered 2024-03-18 – 2024-03-19 (×2): 3 mL via RESPIRATORY_TRACT
  Filled 2024-03-18 (×2): qty 3

## 2024-03-18 NOTE — Plan of Care (Signed)

## 2024-03-18 NOTE — Evaluation (Signed)
 Physical Therapy Evaluation Patient Details Name: Harold Waters MRN: [id number] DOB: 07/06/1946 Today's Date: 03/18/2024  History of Present Illness  Pt is a 78 y.o. male admitted 5/16 with CAP. PMH:  DMII, HTN, CKD, CAD, PAD  Clinical Impression  PT eval complete. Pt independent bed mobility and transfers. Supervision amb 400' without AD. Steady gait. SpO2 94% on RA. No further skilled PT intervention indicated. Will defer further hallway amb to mobility team if pt remains in acute care. PT signing off.        If plan is discharge home, recommend the following:     Can travel by private vehicle        Equipment Recommendations None recommended by PT  Recommendations for Other Services       Functional Status Assessment Patient has not had a recent decline in their functional status     Precautions / Restrictions Precautions Precautions: None Recall of Precautions/Restrictions: Intact      Mobility  Bed Mobility Overal bed mobility: Independent                  Transfers Overall transfer level: Independent Equipment used: None                    Ambulation/Gait Ambulation/Gait assistance: Supervision Gait Distance (Feet): 400 Feet Assistive device: None Gait Pattern/deviations: WFL(Within Functional Limits) Gait velocity: WFL Gait velocity interpretation: >2.62 ft/sec, indicative of community ambulatory   General Gait Details: Steady gait. SpO2 94% on RA  Stairs Stairs:  (deferred due to IV)          Wheelchair Mobility     Tilt Bed    Modified Rankin (Stroke Patients Only)       Balance Overall balance assessment: Mild deficits observed, not formally tested                                           Pertinent Vitals/Pain Pain Assessment Pain Assessment: No/denies pain    Home Living Family/patient expects to be discharged to:: Private residence Living Arrangements: Spouse/significant other Available  Help at Discharge: Family;Available 24 hours/day Type of Home: House Home Access: Stairs to enter Entrance Stairs-Rails: Right Entrance Stairs-Number of Steps: 2   Home Layout: Two level;Bed/bath upstairs;1/2 bath on main level Home Equipment: Cane - single Librarian, academic (2 wheels)      Prior Function Prior Level of Function : Independent/Modified Independent;Driving             Mobility Comments: no AD at baseline       Extremity/Trunk Assessment   Upper Extremity Assessment Upper Extremity Assessment: Overall WFL for tasks assessed    Lower Extremity Assessment Lower Extremity Assessment: Overall WFL for tasks assessed    Cervical / Trunk Assessment Cervical / Trunk Assessment: Normal  Communication   Communication Communication: No apparent difficulties    Cognition Arousal: Alert Behavior During Therapy: WFL for tasks assessed/performed   PT - Cognitive impairments: No apparent impairments                         Following commands: Intact       Cueing       General Comments General comments (skin integrity, edema, etc.): VSS on RA    Exercises     Assessment/Plan    PT Assessment Patient does not need any  further PT services  PT Problem List         PT Treatment Interventions      PT Goals (Current goals can be found in the Care Plan section)  Acute Rehab PT Goals Patient Stated Goal: home PT Goal Formulation: All assessment and education complete, DC therapy    Frequency       Co-evaluation               AM-PAC PT "6 Clicks" Mobility  Outcome Measure Help needed turning from your back to your side while in a flat bed without using bedrails?: None Help needed moving from lying on your back to sitting on the side of a flat bed without using bedrails?: None Help needed moving to and from a bed to a chair (including a wheelchair)?: None Help needed standing up from a chair using your arms (e.g., wheelchair or  bedside chair)?: None Help needed to walk in hospital room?: None Help needed climbing 3-5 steps with a railing? : A Little 6 Click Score: 23    End of Session Equipment Utilized During Treatment: Gait belt Activity Tolerance: Patient tolerated treatment well Patient left: in bed;with call bell/phone within reach Nurse Communication: Mobility status PT Visit Diagnosis: Difficulty in walking, not elsewhere classified (R26.2)    Time: 0981-1914 PT Time Calculation (min) (ACUTE ONLY): 16 min   Charges:   PT Evaluation $PT Eval Low Complexity: 1 Low   PT General Charges $$ ACUTE PT VISIT: 1 Visit         Dorothye Gathers., PT  Office # [phone]   Guadelupe Leech 03/18/2024, 12:25 PM

## 2024-03-18 NOTE — Progress Notes (Signed)
 New Admission Note:  Arrival Method: Stretcher Mental Orientation: Alert and oriented x4 Telemetry: Box 03 NSR Assessment: Completed Skin: warm and dry IV: NSL Pain: Denies Tubes: N/A Safety Measures: Safety Fall Prevention Plan initiated.  Admission: Completed 5 M  Orientation: Patient has been orientated to the room, unit and the staff. Welcome booklet given.  Family: N/A  Orders have been reviewed and implemented. Will continue to monitor the patient. Call light has been placed within reach and bed alarm has been activated.   Woodward Head BSN, RN  Phone Number: [phone]

## 2024-03-18 NOTE — Plan of Care (Signed)
   Problem: Education: Goal: Knowledge of General Education information will improve Description Including pain rating scale, medication(s)/side effects and non-pharmacologic comfort measures Outcome: Progressing

## 2024-03-18 NOTE — Progress Notes (Signed)
 PROGRESS NOTE  Delvin Hedeen Becka ZOX:[id number] DOB: 07-Dec-1945   PCP: Genia Kettering, MD  Patient is from: Home.  Independently ambulates at baseline.  DOA: 03/17/2024 LOS: 1  Chief complaints Chief Complaint  Patient presents with   abnormal labs     Brief Narrative / Interim history: 78 year old M with PMH of CAD, PAD s/p bypass in 2020, DM-2, CKD-3B, HTN, anxiety, depression and mild cognitive impairment sent to ED by PCP due to AKI.  Patient saw PCP for persistent cough and diagnosed with CAP for which she was prescribed doxycycline , albuterol  and Tessalon  Perles.  Later, he received a call due to concern for AKI, and was advised to go to ED.  In ED, hypotensive to 82/43 but improved after IV fluid.  Other vital stable.  93% on RA. Na 134. Cr 3.93 (b/l~2.0).  BUN 54.  Bicarb 18.  AG 16.  WBC 6.2. Noncontrast CT chest with bibasilar infiltrates consistent with multifocal pneumonia.  Patient was started on ceftriaxone , Zithromax  and IV fluid, and admitted for multifocal pneumonia, hypotension and AKI.   Subjective: Seen and examined earlier this morning.  No major events overnight or this morning.  Reports feeling better in terms of energy and breathing.  Still with cough which is productive with whitish phlegm.  Denies hemoptysis.  Denies chest pain, GI or UTI symptoms.  Objective: Vitals:   03/18/24 0300 03/18/24 0504 03/18/24 0850 03/18/24 0907  BP:  (!) 103/53  (!) 105/53  Pulse:  81  78  Resp:  17    Temp:  99.2 F (37.3 C)  98.2 F (36.8 C)  TempSrc:  Oral    SpO2:  94% 93% 93%  Weight:      Height: 5\' 5"  (1.651 m)       Examination:  GENERAL: No apparent distress.  Nontoxic. HEENT: MMM.  Vision and hearing grossly intact.  NECK: Supple.  No apparent JVD.  RESP:  No IWOB.  Crackles bilaterally. CVS:  RRR. Heart sounds normal.  ABD/GI/GU: BS+. Abd soft, NTND.  MSK/EXT:  Moves extremities. No apparent deformity. No edema.  SKIN: no apparent skin lesion  or wound NEURO: Awake, alert and oriented appropriately.  No apparent focal neuro deficit. PSYCH: Calm. Normal affect.   Consultants:  None  Procedures: None  Microbiology summarized: Blood cultures NGTD  Assessment and plan: Multifocal pneumonia, POA: Initially seen by PCP for persistent cough.  Noncontrast CT with bibasilar infiltrates concerning for multifocal pneumonia.  Reports feeling better but continues to endorse productive cough with whitish phlegm.  Pro-Cal 0.25.  Exam with bilateral crackles over lower lung fields. -Continue ceftriaxone  and Zithromax  -Follow sputum culture -Check urine strep pneumo and Legionella antigens -Incentive spirometry, OOB, PT/OT   AKI on CKD-3B: Likely prerenal in the setting of acute illness, hypotension and medications.  Improving with IV fluid. Recent Labs    03/22/23 0859 07/19/23 1219 09/24/23 0810 10/11/23 0816 03/17/24 1429 03/17/24 2045 03/18/24 0814  BUN 28* 29* 22 35* 49* 54* 50*  CREATININE 2.20* 2.07* 2.00* 2.19* 3.59* 3.93* 3.07*  - Continue IV fluids - Avoid nephrotoxic meds-hold home [place], Jardiance , Benicar - Avoid hypotension  Hypotension: Likely due to acute illness and antihypertensive meds.  TSH and cortisol normal. - Continue holding home chlorthalidone, Jardiance , Imdur , Benicar - Continue IV fluid.  High anion gap metabolic acidosis: In the setting of AKI and azotemia.  Lactic acid normal.  Improving. -Continue monitoring   NIDDM-2 with hyperglycemia: A1c 6.7% Recent Labs  Lab 03/17/24  2322 03/18/24 0047 03/18/24 0726 03/18/24 1129  GLUCAP 95 105* 138* 185*  - Add Semglee 10 units daily - Continue SSI-sensitive - Continue holding Jardiance   Mild hypovolemic hyponatremia: Resolved   History of CAD/HLD: No chest pain.  Serial troponin negative. -Continue home Plavix , Zetia  and Crestor .   - Hold home antihypertensive meds  History of PAD s/p bypass in 2020 -Antiplatelet and Crestor  as  above  Memory impairment/anxiety/depression: Patient is well-oriented. -Continue home meds  Body mass index is 25.46 kg/m.          DVT prophylaxis:  enoxaparin  (LOVENOX ) injection 30 mg Start: 03/18/24 1000  Code Status: Full code Family Communication: None at bedside Level of care: Telemetry Medical Status is: Inpatient Remains inpatient appropriate because: Multifocal pneumonia, hypotension and AKI   Final disposition: Home   55 minutes with more than 50% spent in reviewing records, counseling patient/family and coordinating care.   Sch Meds:  Scheduled Meds:  clopidogrel   75 mg Oral Daily   enoxaparin  (LOVENOX ) injection  30 mg Subcutaneous Daily   ezetimibe   10 mg Oral Daily   guaiFENesin   600 mg Oral BID   insulin  aspart  0-5 Units Subcutaneous QHS   insulin  aspart  0-9 Units Subcutaneous TID WC   insulin  glargine-yfgn  10 Units Subcutaneous Daily   ipratropium-albuterol   3 mL Nebulization BID   memantine   14 mg Oral QHS   rosuvastatin   20 mg Oral Daily   sertraline   25 mg Oral Daily   sodium chloride  HYPERTONIC  4 mL Nebulization BID   traZODone   25 mg Oral QHS   Continuous Infusions:  sodium chloride  100 mL/hr at 03/17/24 2326   azithromycin  (ZITHROMAX ) 500 mg in sodium chloride  0.9 % 250 mL IVPB     cefTRIAXone  (ROCEPHIN )  IV     PRN Meds:.acetaminophen , guaiFENesin -dextromethorphan, melatonin, mouth rinse, polyethylene glycol, prochlorperazine   Antimicrobials: Anti-infectives (From admission, onward)    Start     Dose/Rate Route Frequency Ordered Stop   03/18/24 2200  cefTRIAXone  (ROCEPHIN ) 1 g in sodium chloride  0.9 % 100 mL IVPB        1 g 200 mL/hr over 30 Minutes Intravenous Every 24 hours 03/17/24 2302     03/18/24 2200  azithromycin  (ZITHROMAX ) 500 mg in sodium chloride  0.9 % 250 mL IVPB        500 mg 250 mL/hr  Intravenous Every 24 hours 03/17/24 2302     03/17/24 2130  cefTRIAXone  (ROCEPHIN ) 1 g in sodium chloride  0.9 % 100 mL IVPB         1 g 200 mL/hr over 30 Minutes Intravenous  Once 03/17/24 2126 03/17/24 2208   03/17/24 2130  azithromycin  (ZITHROMAX ) 500 mg in sodium chloride  0.9 % 250 mL IVPB        500 mg 250 mL/hr over 60 Minutes Intravenous  Once 03/17/24 2126 03/17/24 2314        I have personally reviewed the following labs and images: CBC: Recent Labs  Lab 03/17/24 1429 03/17/24 2045 03/18/24 0814  WBC 6.5 6.2 5.4  NEUTROABS 4.6  --   --   HGB 11.8* 11.8* 10.8*  HCT 35.9* 36.9* 34.3*  MCV 90.4 93.2 93.2  PLT 191.0 188 187   BMP &GFR Recent Labs  Lab 03/17/24 1429 03/17/24 2045 03/18/24 0814  NA 138 134* 139  K 4.4 4.2 4.3  CL 103 100 108  CO2 26 18* 19*  GLUCOSE 159* 122* 176*  BUN 49* 54* 50*  CREATININE  3.59* 3.93* 3.07*  CALCIUM  9.1 8.8* 8.4*  MG  --   --  2.2  PHOS  --   --  4.0   Estimated Creatinine Clearance: 17.3 mL/min (A) (by C-G formula based on SCr of 3.07 mg/dL (H)). Liver & Pancreas: Recent Labs  Lab 03/17/24 1429  AST 14  ALT 23  ALKPHOS 52  BILITOT 0.8  PROT 7.3  ALBUMIN 4.3   No results for input(s): "LIPASE", "AMYLASE" in the last 168 hours. No results for input(s): "AMMONIA" in the last 168 hours. Diabetic: Recent Labs    03/17/24 2045  HGBA1C 6.7*   Recent Labs  Lab 03/17/24 2322 03/18/24 0047 03/18/24 0726 03/18/24 1129  GLUCAP 95 105* 138* 185*   Cardiac Enzymes: No results for input(s): "CKTOTAL", "CKMB", "CKMBINDEX", "TROPONINI" in the last 168 hours. Recent Labs    07/19/23 1219  PROBNP 141   Coagulation Profile: No results for input(s): "INR", "PROTIME" in the last 168 hours. Thyroid  Function Tests: Recent Labs    03/18/24 0814  TSH 2.246   Lipid Profile: No results for input(s): "CHOL", "HDL", "LDLCALC", "TRIG", "CHOLHDL", "LDLDIRECT" in the last 72 hours. Anemia Panel: No results for input(s): "VITAMINB12", "FOLATE", "FERRITIN", "TIBC", "IRON", "RETICCTPCT" in the last 72 hours. Urine analysis:    Component Value  Date/Time   COLORURINE YELLOW 09/24/2023 0810   APPEARANCEUR CLEAR 09/24/2023 0810   LABSPEC 1.015 09/24/2023 0810   PHURINE 6.5 09/24/2023 0810   GLUCOSEU NEGATIVE 09/24/2023 0810   HGBUR NEGATIVE 09/24/2023 0810   BILIRUBINUR NEGATIVE 09/24/2023 0810   KETONESUR NEGATIVE 09/24/2023 0810   PROTEINUR NEGATIVE 08/23/2020 1639   UROBILINOGEN 0.2 09/24/2023 0810   NITRITE NEGATIVE 09/24/2023 0810   LEUKOCYTESUR NEGATIVE 09/24/2023 0810   Sepsis Labs: Invalid input(s): "PROCALCITONIN", "LACTICIDVEN"  Microbiology: Recent Results (from the past 240 hours)  Blood culture (routine x 2)     Status: None (Preliminary result)   Collection Time: 03/17/24  8:45 PM   Specimen: BLOOD  Result Value Ref Range Status   Specimen Description BLOOD RIGHT ANTECUBITAL  Final   Special Requests   Final    BOTTLES DRAWN AEROBIC AND ANAEROBIC Blood Culture adequate volume   Culture   Final    NO GROWTH < 12 HOURS Performed at [facility], [address] [place], [place] [address]    Report Status PENDING  Incomplete  Blood culture (routine x 2)     Status: None (Preliminary result)   Collection Time: 03/17/24  8:51 PM   Specimen: BLOOD  Result Value Ref Range Status   Specimen Description BLOOD LEFT ANTECUBITAL  Final   Special Requests   Final    BOTTLES DRAWN AEROBIC AND ANAEROBIC Blood Culture adequate volume   Culture   Final    NO GROWTH < 12 HOURS Performed at [facility], [address], [place], [place] [address]    Report Status PENDING  Incomplete    Radiology Studies: CT CHEST WO CONTRAST Result Date: 03/17/2024 CLINICAL DATA:  Abnormal chest x-ray EXAM: CT CHEST WITHOUT CONTRAST TECHNIQUE: Multidetector CT imaging of the chest was performed following the standard protocol without IV contrast. RADIATION DOSE REDUCTION: This exam was performed according to the departmental dose-optimization program which includes automated exposure control, adjustment of the mA and/or  kV according to patient size and/or use of iterative reconstruction technique. COMPARISON:  Chest x-ray from earlier in the same day, CT from 07/14/23 FINDINGS: Cardiovascular: Limited due to lack of IV contrast.  Atherosclerotic calcifications of the aorta are noted without aneurysmal dilatation. Diffuse coronary calcifications are noted. No cardiac enlargement is seen. Mediastinum/Nodes: The thoracic inlet is within normal limits. No hilar or mediastinal adenopathy is noted. The esophagus as visualized is within normal limits. Lungs/Pleura: Lungs are well aerated bilaterally. Bronchial wall thickening is noted diffusely in the lower lobes bilaterally with associated focal infiltrate in the posterior lower lobes bilaterally. These changes could be related to aspiration but more likely secondary to multifocal pneumonia. These are new from the prior PET-CT but similar in appearance to that on current chest x-ray. No parenchymal nodules are seen. No effusion is noted. Upper Abdomen: Visualized upper abdomen is within normal limits. Musculoskeletal: Degenerative changes of the thoracic spine are noted. No acute rib seen. IMPRESSION: Bibasilar infiltrates consistent with multifocal pneumonia. No other acute abnormality is noted. Aortic Atherosclerosis (ICD10-I70.0). Electronically Signed   By: Violeta Grey M.D.   On: 03/17/2024 22:00   DG Chest 2 View Result Date: 03/17/2024 CLINICAL DATA:  Cough. EXAM: CHEST - 2 VIEW COMPARISON:  Chest radiograph dated 08/02/2023. FINDINGS: Left lung base density similar to prior radiograph may represent chronic atelectasis/scarring. Recurrent pneumonia is not excluded. The right lung is clear. No pleural effusion pneumothorax. Stable cardiac silhouette no acute osseous pathology. IMPRESSION: Left lung base density may represent chronic atelectasis/scarring. Recurrent pneumonia is not excluded. Electronically Signed   By: Angus Bark M.D.   On: 03/17/2024 14:35      Camdan Burdi T.  Timote[facility] Carre[facility]  If 7PM-7AM, please contact night-coverage [email or web address] 03/18/2024, 1:00 PM

## 2024-03-19 DIAGNOSIS — E118 Type 2 diabetes mellitus with unspecified complications: Secondary | ICD-10-CM | POA: Diagnosis not present

## 2024-03-19 DIAGNOSIS — I25119 Atherosclerotic heart disease of native coronary artery with unspecified angina pectoris: Secondary | ICD-10-CM | POA: Diagnosis not present

## 2024-03-19 DIAGNOSIS — N179 Acute kidney failure, unspecified: Secondary | ICD-10-CM | POA: Diagnosis not present

## 2024-03-19 DIAGNOSIS — I10 Essential (primary) hypertension: Secondary | ICD-10-CM

## 2024-03-19 DIAGNOSIS — R413 Other amnesia: Secondary | ICD-10-CM

## 2024-03-19 DIAGNOSIS — I739 Peripheral vascular disease, unspecified: Secondary | ICD-10-CM

## 2024-03-19 DIAGNOSIS — J189 Pneumonia, unspecified organism: Secondary | ICD-10-CM | POA: Diagnosis not present

## 2024-03-19 LAB — CBC
HCT: 31.9 % — ABNORMAL LOW (ref 39.0–52.0)
Hemoglobin: 10.6 g/dL — ABNORMAL LOW (ref 13.0–17.0)
MCH: 29.9 pg (ref 26.0–34.0)
MCHC: 33.2 g/dL (ref 30.0–36.0)
MCV: 90.1 fL (ref 80.0–100.0)
Platelets: 219 10*3/uL (ref 150–400)
RBC: 3.54 MIL/uL — ABNORMAL LOW (ref 4.22–5.81)
RDW: 13.2 % (ref 11.5–15.5)
WBC: 8.2 10*3/uL (ref 4.0–10.5)
nRBC: 0 % (ref 0.0–0.2)

## 2024-03-19 LAB — RENAL FUNCTION PANEL
Albumin: 2.8 g/dL — ABNORMAL LOW (ref 3.5–5.0)
Anion gap: 9 (ref 5–15)
BUN: 53 mg/dL — ABNORMAL HIGH (ref 8–23)
CO2: 17 mmol/L — ABNORMAL LOW (ref 22–32)
Calcium: 8.5 mg/dL — ABNORMAL LOW (ref 8.9–10.3)
Chloride: 113 mmol/L — ABNORMAL HIGH (ref 98–111)
Creatinine, Ser: 2.37 mg/dL — ABNORMAL HIGH (ref 0.61–1.24)
GFR, Estimated: 27 mL/min — ABNORMAL LOW (ref >60)
Glucose, Bld: 175 mg/dL — ABNORMAL HIGH (ref 70–99)
Phosphorus: 4.2 mg/dL (ref 2.5–4.6)
Potassium: 4.7 mmol/L (ref 3.5–5.1)
Sodium: 139 mmol/L (ref 135–145)

## 2024-03-19 LAB — STREP PNEUMONIAE URINARY ANTIGEN: Strep Pneumo Urinary Antigen: NEGATIVE

## 2024-03-19 LAB — MAGNESIUM: Magnesium: 2.2 mg/dL (ref 1.7–2.4)

## 2024-03-19 LAB — GLUCOSE, CAPILLARY: Glucose-Capillary: 131 mg/dL — ABNORMAL HIGH (ref 70–99)

## 2024-03-19 MED ORDER — AZITHROMYCIN 500 MG PO TABS: 500.0000 mg | ORAL_TABLET | Freq: Once | ORAL | Status: DC

## 2024-03-19 MED ORDER — AMOXICILLIN-POT CLAVULANATE 500-125 MG PO TABS
1.0000 | ORAL_TABLET | Freq: Two times a day (BID) | ORAL | 0 refills | Status: AC
Start: 2024-03-19 — End: 2024-03-23

## 2024-03-19 MED ORDER — AZITHROMYCIN 250 MG PO TABS: 250.0000 mg | ORAL_TABLET | Freq: Every day | ORAL | 0 refills | Status: DC

## 2024-03-19 MED ORDER — GUAIFENESIN ER 600 MG PO TB12: 600.0000 mg | ORAL_TABLET | Freq: Two times a day (BID) | ORAL | Status: AC

## 2024-03-19 NOTE — Plan of Care (Signed)

## 2024-03-19 NOTE — Progress Notes (Signed)
 OT Cancellation Note  Patient Details Name: Harold Waters MRN: [id number] DOB: 1946/01/04   Cancelled Treatment:    Reason Eval/Treat Not Completed: OT screened, no needs identified, will sign off. Per PT pt independent, with no self-care concerns. OT is signing off on this pt.   Harold Waters C, OT  Acute Rehabilitation Services Office [phone] Secure chat preferred   Harold Waters 03/19/2024, 7:07 AM

## 2024-03-19 NOTE — Discharge Summary (Signed)
 Physician Discharge Summary  Harold Waters WGN:[id number] DOB: 12/15/1945 DOA: 03/17/2024  PCP: Genia Kettering, MD  Admit date: 03/17/2024 Discharge date: 03/19/24  Admitted From: Home Disposition: Home Recommendations for Outpatient Follow-up:  Follow up with PCP in 1 week BP meds discontinued on discharge.  Reassess blood pressure and renal function at follow-up Please follow up on the following pending results: None  Home Health: No need identified Equipment/Devices: No need identified  Discharge Condition: Stable CODE STATUS: Full code  Follow-up Information     Plotnikov, Oakley Bellman, MD. Schedule an appointment as soon as possible for a visit in 1 week(s).   Specialty: Internal Medicine Contact information: [address] [place] [address] [phone]                 Hospital course 78 year old M with PMH of CAD, PAD s/p bypass in 2020, DM-2, CKD-3B, HTN, anxiety, depression and mild cognitive impairment sent to ED by PCP due to AKI.  Patient saw PCP for persistent cough and diagnosed with CAP for which she was prescribed doxycycline , albuterol  and Tessalon  Perles.  Later, he received a call due to concern for AKI, and was advised to go to ED.   In ED, hypotensive to 82/43 but improved after IV fluid.  Other vital stable.  93% on RA. Na 134. Cr 3.93 (b/l~2.0).  BUN 54.  Bicarb 18.  AG 16.  WBC 6.2. Noncontrast CT chest with bibasilar infiltrates consistent with multifocal pneumonia.  Patient was started on ceftriaxone , Zithromax  and IV fluid, and admitted for multifocal pneumonia, hypotension and AKI.  The next day, patient felt better and AKI improved.  Blood cultures negative.  MRSA PCR screen negative.  Independently ambulated in the hallway without distress.  On the day of discharge, continues to feel better.  AKI improved further.  Felt well and ready to go home.  He is discharged on p.o. Augmentin  for 3 more days and Zithromax  for 2 more  days.  Given soft blood pressure, home antihypertensive meds and diuretics discontinued on discharge.  Reassess blood pressure and renal function and resume antihypertensive meds as appropriate outpatient.  See individual problem list below for more.   Problems addressed during this hospitalization Multifocal pneumonia, POA: Initially seen by PCP for persistent cough.  Noncontrast CT with bibasilar infiltrates concerning for multifocal pneumonia.  Pro-Cal 0.25.  Patient's symptoms improved.  Felt well and ready to go home.  Blood cultures NGTD.  MRSA PCR screen negative.  Received ceftriaxone  and Zithromax  for 2 days.  Discharged on p.o. Augmentin  and Zithromax  to complete a total of 5 days course.  Outpatient follow-up with PCP in 1 week.   AKI on CKD-3B: Likely prerenal in the setting of acute illness, hypotension and medications. with IV fluid.  b/l Cr ~2.0-2.2.  AKI resolving. Recent Labs    03/22/23 0859 07/19/23 1219 09/24/23 0810 10/11/23 0816 03/17/24 1429 03/17/24 2045 03/18/24 0814 03/19/24 0803  BUN 28* 29* 22 35* 49* 54* 50* 53*  CREATININE 2.20* 2.07* 2.00* 2.19* 3.59* 3.93* 3.07* 2.37*  - Discontinued home antihypertensive meds and diuretics to avoid hypotension  Hypotension: Likely due to acute illness and antihypertensive meds.  TSH and cortisol normal.  Hypotension resolved. Discontinued home chlorthalidone, Jardiance , Imdur , Benicar   High anion gap metabolic acidosis:  Lactic acid normal.  Improved.  Anticipate further improvement with improvement of AKI   NIDDM-2 with hyperglycemia: A1c 6.7%.  Discontinued home Jardiance  given AKI and concern for hypotension.  Consider  resuming outpatient    Mild hypovolemic hyponatremia: Resolved   History of CAD/HLD: No chest pain.  Serial troponin negative. -Continue home Plavix , Zetia  and Crestor .     History of PAD s/p bypass in 2020 -Antiplatelet and Crestor  as above   Memory impairment/anxiety/depression: Patient is  well-oriented with good insight.. -Continue home meds                Time spent 35 minutes  Vital signs Vitals:   03/18/24 1931 03/19/24 0531 03/19/24 0748 03/19/24 0814  BP:  125/61  (!) 108/52  Pulse: 63 74  63  Temp:  98.2 F (36.8 C)  (!) 97.4 F (36.3 C)  Resp: 15     Height:      Weight:      SpO2: 97% 96% 96% 95%  TempSrc:      BMI (Calculated):         Discharge exam  GENERAL: No apparent distress.  Nontoxic. HEENT: MMM.  Vision and hearing grossly intact.  NECK: Supple.  No apparent JVD.  RESP:  No IWOB.  Fair aeration bilaterally. CVS:  RRR. Heart sounds normal.  ABD/GI/GU: BS+. Abd soft, NTND.  MSK/EXT:  Moves extremities. No apparent deformity. No edema.  SKIN: no apparent skin lesion or wound NEURO: Awake and alert. Oriented appropriately.  No apparent focal neuro deficit. PSYCH: Calm. Normal affect.   Discharge Instructions Discharge Instructions     Diet - low sodium heart healthy   Complete by: As directed    Discharge instructions   Complete by: As directed    It has been a pleasure taking care of you!  You were hospitalized due to pneumonia and a low blood pressure and acute on chronic kidney failure.  You have been started on antibiotics for pneumonia.  Your symptoms improved.  We are discharging you on more antibiotics to complete treatment course. Your blood pressure and kidney function improved with intravenous fluid hydration and stopping your blood pressure medication.  We have discontinued all your blood pressure medication.  Please check your blood pressure at home and keep blood pressure log.  Check in with your primary care doctor next week to have your blood pressure and kidney function rechecked. Please review your new medication list and the directions on your medications before you take them.   Take care,   Increase activity slowly   Complete by: As directed       Allergies as of 03/19/2024       Reactions   Egg-derived  Products Itching   Boiled eggs    Aricept  [donepezil ] Nausea And Vomiting   Coreg  [carvedilol ] Other (See Comments)   Fatigue        Medication List     PAUSE taking these medications    empagliflozin  10 MG Tabs tablet Wait to take this until: Mar 26, 2024 Commonly known as: JARDIANCE  Take 1 tablet (10 mg total) by mouth daily before breakfast.       STOP taking these medications    chlorthalidone 25 MG tablet Commonly known as: HYGROTON   DELSYM PO   doxycycline  100 MG tablet Commonly known as: VIBRA -TABS   isosorbide  mononitrate 30 MG 24 hr tablet Commonly known as: IMDUR    olmesartan 40 MG tablet Commonly known as: BENICAR       TAKE these medications    acetaminophen  500 MG tablet Commonly known as: TYLENOL  Take 500 mg by mouth every 6 (six) hours as needed for mild pain (pain score  1-3) or moderate pain (pain score 4-6).   albuterol  108 (90 Base) MCG/ACT inhaler Commonly known as: VENTOLIN  HFA Inhale 2 puffs into the lungs every 4 (four) hours as needed for wheezing or shortness of breath.   amoxicillin -clavulanate 500-125 MG tablet Commonly known as: Augmentin  Take 1 tablet by mouth in the morning and at bedtime for 4 days.   azithromycin  250 MG tablet Commonly known as: Zithromax  Take 1 tablet (250 mg total) by mouth daily.   benzonatate  100 MG capsule Commonly known as: TESSALON  Take 1 capsule (100 mg total) by mouth 2 (two) times daily as needed for cough.   cholecalciferol 25 MCG (1000 UNIT) tablet Commonly known as: VITAMIN D3 Take 1,000 Units by mouth in the morning.   clopidogrel  75 MG tablet Commonly known as: Plavix  Take 1 tablet (75 mg total) by mouth daily. What changed: when to take this   ezetimibe  10 MG tablet Commonly known as: ZETIA  Take 1 tablet (10 mg total) by mouth daily. What changed: when to take this   guaiFENesin  600 MG 12 hr tablet Commonly known as: MUCINEX  Take 1 tablet (600 mg total) by mouth 2 (two)  times daily for 5 days.   memantine  14 MG Cp24 24 hr capsule Commonly known as: NAMENDA  XR TAKE 1 CAPSULE BY MOUTH NIGHTLY   nitroGLYCERIN  0.4 MG SL tablet Commonly known as: Nitrostat  Place 1 tablet under tongue every 5 mins. DO NOT USE MORE THAN 3 TABS   polyethylene glycol powder 17 GM/SCOOP powder Commonly known as: GLYCOLAX /MIRALAX  Take 17 g by mouth 2 (two) times daily as needed for moderate constipation.   rosuvastatin  20 MG tablet Commonly known as: CRESTOR  Take 1 tablet (20 mg total) by mouth daily. What changed: when to take this   sertraline  25 MG tablet Commonly known as: Zoloft  Take 1 tablet (25 mg total) by mouth daily.   traZODone  50 MG tablet Commonly known as: DESYREL  TAKE 1/4 TO 1/2 TABLET BY MOUTH AT NIGHT AS NEEDED FOR INSOMNIA What changed: See the new instructions.   vitamin B-12 500 MCG tablet Commonly known as: CYANOCOBALAMIN  Take 500 mcg by mouth in the morning.        Consultations: None  Procedures/Studies:   CT CHEST WO CONTRAST Result Date: 03/17/2024 CLINICAL DATA:  Abnormal chest x-ray EXAM: CT CHEST WITHOUT CONTRAST TECHNIQUE: Multidetector CT imaging of the chest was performed following the standard protocol without IV contrast. RADIATION DOSE REDUCTION: This exam was performed according to the departmental dose-optimization program which includes automated exposure control, adjustment of the mA and/or kV according to patient size and/or use of iterative reconstruction technique. COMPARISON:  Chest x-ray from earlier in the same day, CT from 07/14/23 FINDINGS: Cardiovascular: Limited due to lack of IV contrast. Atherosclerotic calcifications of the aorta are noted without aneurysmal dilatation. Diffuse coronary calcifications are noted. No cardiac enlargement is seen. Mediastinum/Nodes: The thoracic inlet is within normal limits. No hilar or mediastinal adenopathy is noted. The esophagus as visualized is within normal limits. Lungs/Pleura:  Lungs are well aerated bilaterally. Bronchial wall thickening is noted diffusely in the lower lobes bilaterally with associated focal infiltrate in the posterior lower lobes bilaterally. These changes could be related to aspiration but more likely secondary to multifocal pneumonia. These are new from the prior PET-CT but similar in appearance to that on current chest x-ray. No parenchymal nodules are seen. No effusion is noted. Upper Abdomen: Visualized upper abdomen is within normal limits. Musculoskeletal: Degenerative changes of the thoracic spine  are noted. No acute rib seen. IMPRESSION: Bibasilar infiltrates consistent with multifocal pneumonia. No other acute abnormality is noted. Aortic Atherosclerosis (ICD10-I70.0). Electronically Signed   By: Violeta Grey M.D.   On: 03/17/2024 22:00   DG Chest 2 View Result Date: 03/17/2024 CLINICAL DATA:  Cough. EXAM: CHEST - 2 VIEW COMPARISON:  Chest radiograph dated 08/02/2023. FINDINGS: Left lung base density similar to prior radiograph may represent chronic atelectasis/scarring. Recurrent pneumonia is not excluded. The right lung is clear. No pleural effusion pneumothorax. Stable cardiac silhouette no acute osseous pathology. IMPRESSION: Left lung base density may represent chronic atelectasis/scarring. Recurrent pneumonia is not excluded. Electronically Signed   By: Angus Bark M.D.   On: 03/17/2024 14:35   ECHOCARDIOGRAM COMPLETE Result Date: 03/06/2024    ECHOCARDIOGRAM REPORT   Patient Name:   Harold Waters Weekley Date of Exam: 03/06/2024 Medical Rec #:  [id number]            Height:       65.0 in Accession #:    [id number]           Weight:       159.0 lb Date of Birth:  November 23, 1945             BSA:          1.794 m Patient Age:    78 years             BP:           108/60 mmHg Patient Gender: M                    HR:           60 bpm. Exam Location:  [address] Procedure: 2D Echo, 3D Echo, Cardiac Doppler and Color Doppler (Both Spectral            and  Color Flow Doppler were utilized during procedure). Indications:    I35.0 Aortic Stenosis                 I35.1 Aortic Insufficiency  History:        Patient has prior history of Echocardiogram examinations, most                 recent 03/17/2023. CAD and Previous Myocardial Infarction, PAD,                 Aortic Valve Disease, Arrythmias:PSVT, Signs/Symptoms:Shortness                 of Breath; Risk Factors:Hypertension, Diabetes, Dyslipidemia,                 Former Smoker and Family History of Coronary Artery Disease.  Sonographer:    Ewing Holiday RDCS Referring Phys: ALEKSEI V PLOTNIKOV IMPRESSIONS  1. Left ventricular ejection fraction, by estimation, is 70 to 75%. Left ventricular ejection fraction by 3D volume is 71 %. The left ventricle has hyperdynamic function. The left ventricle has no regional wall motion abnormalities. Left ventricular diastolic parameters are consistent with Grade I diastolic dysfunction (impaired relaxation).  2. Right ventricular systolic function is normal. The right ventricular size is normal. There is normal pulmonary artery systolic pressure.  3. Left atrial size was mildly dilated.  4. Right atrial size was mild to moderately dilated.  5. The mitral valve is normal in structure. Trivial mitral valve regurgitation. No evidence of mitral stenosis.  6. The aortic valve is tricuspid. Aortic valve regurgitation is mild. Mild aortic  valve stenosis. Aortic regurgitation PHT measures 508 msec. Aortic valve area, by VTI measures 1.82 cm. Aortic valve mean gradient measures 12.2 mmHg. Aortic valve Vmax measures 2.36 m/s.  7. The inferior vena cava is normal in size with greater than 50% respiratory variability, suggesting right atrial pressure of 3 mmHg. FINDINGS  Left Ventricle: Left ventricular ejection fraction, by estimation, is 70 to 75%. Left ventricular ejection fraction by 3D volume is 71 %. The left ventricle has hyperdynamic function. The left ventricle has no regional wall  motion abnormalities. The left ventricular internal cavity size was normal in size. There is no left ventricular hypertrophy. Left ventricular diastolic parameters are consistent with Grade I diastolic dysfunction (impaired relaxation). Right Ventricle: The right ventricular size is normal. No increase in right ventricular wall thickness. Right ventricular systolic function is normal. There is normal pulmonary artery systolic pressure. The tricuspid regurgitant velocity is 2.37 m/s, and  with an assumed right atrial pressure of 3 mmHg, the estimated right ventricular systolic pressure is 25.5 mmHg. Left Atrium: Left atrial size was mildly dilated. Right Atrium: Right atrial size was mild to moderately dilated. Pericardium: There is no evidence of pericardial effusion. Mitral Valve: The mitral valve is normal in structure. Trivial mitral valve regurgitation. No evidence of mitral valve stenosis. Tricuspid Valve: The tricuspid valve is normal in structure. Tricuspid valve regurgitation is trivial. No evidence of tricuspid stenosis. Aortic Valve: The aortic valve is tricuspid. Aortic valve regurgitation is mild. Aortic regurgitation PHT measures 508 msec. Mild aortic stenosis is present. Aortic valve mean gradient measures 12.2 mmHg. Aortic valve peak gradient measures 22.3 mmHg. Aortic valve area, by VTI measures 1.82 cm. Pulmonic Valve: The pulmonic valve was normal in structure. Pulmonic valve regurgitation is trivial. No evidence of pulmonic stenosis. Aorta: The aortic root is normal in size and structure. Venous: The inferior vena cava is normal in size with greater than 50% respiratory variability, suggesting right atrial pressure of 3 mmHg. IAS/Shunts: No atrial level shunt detected by color flow Doppler. Additional Comments: 3D was performed not requiring image post processing on an independent workstation and was normal.  LEFT VENTRICLE PLAX 2D LVIDd:         4.40 cm         Diastology LVIDs:         2.30 cm          LV e' medial:    5.84 cm/s LV PW:         1.10 cm         LV E/e' medial:  10.2 LV IVS:        0.90 cm         LV e' lateral:   9.88 cm/s LVOT diam:     2.30 cm         LV E/e' lateral: 6.0 LV SV:         109 LV SV Index:   61 LVOT Area:     4.15 cm        3D Volume EF                                LV 3D EF:    Left  ventricul                                             ar                                             ejection                                             fraction                                             by 3D                                             volume is                                             71 %.                                 3D Volume EF:                                3D EF:        71 %                                LV EDV:       122 ml                                LV ESV:       35 ml                                LV SV:        87 ml RIGHT VENTRICLE RV Basal diam:  4.10 cm RV Mid diam:    3.50 cm RV S prime:     13.40 cm/s TAPSE (M-mode): 2.0 cm RVSP:           25.5 mmHg LEFT ATRIUM             Index        RIGHT ATRIUM           Index LA diam:        4.30 cm 2.40 cm/m   RA Pressure: 3.00 mmHg LA Vol (A2C):   57.5 ml 32.04 ml/m  RA Area:     19.80 cm LA Vol (A4C):   54.2 ml 30.21 ml/m  RA  Volume:   59.30 ml  33.05 ml/m LA Biplane Vol: 56.6 ml 31.54 ml/m  AORTIC VALVE AV Area (Vmax):    1.85 cm AV Area (Vmean):   1.75 cm AV Area (VTI):     1.82 cm AV Vmax:           236.00 cm/s AV Vmean:          163.500 cm/s AV VTI:            0.598 m AV Peak Grad:      22.3 mmHg AV Mean Grad:      12.2 mmHg LVOT Vmax:         105.00 cm/s LVOT Vmean:        69.000 cm/s LVOT VTI:          0.262 m LVOT/AV VTI ratio: 0.44 AI PHT:            508 msec  AORTA Ao Root diam: 3.00 cm Ao Asc diam:  3.50 cm MITRAL VALVE                TRICUSPID VALVE MV Area (PHT)  cm          TR Peak grad:   22.5 mmHg MV Decel Time: 305 msec     TR Vmax:         237.00 cm/s MV E velocity: 59.57 cm/s   Estimated RAP:  3.00 mmHg MV A velocity: 113.67 cm/s  RVSP:           25.5 mmHg MV E/A ratio:  0.52                             SHUNTS                             Systemic VTI:  0.26 m                             Systemic Diam: 2.30 cm Harold Sos MD Electronically signed by Harold Sos MD Signature Date/Time: 03/06/2024/3:06:33 PM    Final        The results of significant diagnostics from this hospitalization (including imaging, microbiology, ancillary and laboratory) are listed below for reference.     Microbiology: Recent Results (from the past 240 hours)  Blood culture (routine x 2)     Status: None (Preliminary result)   Collection Time: 03/17/24  8:45 PM   Specimen: BLOOD  Result Value Ref Range Status   Specimen Description BLOOD RIGHT ANTECUBITAL  Final   Special Requests   Final    BOTTLES DRAWN AEROBIC AND ANAEROBIC Blood Culture adequate volume   Culture   Final    NO GROWTH 2 DAYS Performed at [facility], [address] [place], [place] [address]    Report Status PENDING  Incomplete  Blood culture (routine x 2)     Status: None (Preliminary result)   Collection Time: 03/17/24  8:51 PM   Specimen: BLOOD  Result Value Ref Range Status   Specimen Description BLOOD LEFT ANTECUBITAL  Final   Special Requests   Final    BOTTLES DRAWN AEROBIC AND ANAEROBIC Blood Culture adequate volume   Culture   Final    NO GROWTH 2 DAYS Performed at [facility], [address], [place], [place] [address]  Report Status PENDING  Incomplete  Expectorated Sputum Assessment w Gram Stain, Rflx to Resp Cult     Status: None   Collection Time: 03/18/24  1:49 AM   Specimen: Expectorated Sputum  Result Value Ref Range Status   Specimen Description EXPECTORATED SPUTUM  Final   Special Requests NONE  Final   Sputum evaluation   Final    Sputum specimen not acceptable for testing.  Please recollect.   Results Called to: RN  Vicci Graff [id number] BY Chestine Costain, MT Performed at [facility], [address], [place], [place] [address]    Report Status 03/18/2024 FINAL  Final     Labs:  CBC: Recent Labs  Lab 03/17/24 1429 03/17/24 2045 03/18/24 0814 03/19/24 0803  WBC 6.5 6.2 5.4 8.2  NEUTROABS 4.6  --   --   --   HGB 11.8* 11.8* 10.8* 10.6*  HCT 35.9* 36.9* 34.3* 31.9*  MCV 90.4 93.2 93.2 90.1  PLT 191.0 188 187 219   BMP &GFR Recent Labs  Lab 03/17/24 1429 03/17/24 2045 03/18/24 0814 03/19/24 0803  NA 138 134* 139 139  K 4.4 4.2 4.3 4.7  CL 103 100 108 113*  CO2 26 18* 19* 17*  GLUCOSE 159* 122* 176* 175*  BUN 49* 54* 50* 53*  CREATININE 3.59* 3.93* 3.07* 2.37*  CALCIUM  9.1 8.8* 8.4* 8.5*  MG  --   --  2.2 2.2  PHOS  --   --  4.0 4.2   Estimated Creatinine Clearance: 22.3 mL/min (A) (by C-G formula based on SCr of 2.37 mg/dL (H)). Liver & Pancreas: Recent Labs  Lab 03/17/24 1429 03/19/24 0803  AST 14  --   ALT 23  --   ALKPHOS 52  --   BILITOT 0.8  --   PROT 7.3  --   ALBUMIN 4.3 2.8*   No results for input(s): "LIPASE", "AMYLASE" in the last 168 hours. No results for input(s): "AMMONIA" in the last 168 hours. Diabetic: Recent Labs    03/17/24 2045  HGBA1C 6.7*   Recent Labs  Lab 03/18/24 0726 03/18/24 1129 03/18/24 1729 03/18/24 2227 03/19/24 0725  GLUCAP 138* 185* 129* 170* 131*   Cardiac Enzymes: No results for input(s): "CKTOTAL", "CKMB", "CKMBINDEX", "TROPONINI" in the last 168 hours. Recent Labs    07/19/23 1219  PROBNP 141   Coagulation Profile: No results for input(s): "INR", "PROTIME" in the last 168 hours. Thyroid  Function Tests: Recent Labs    03/18/24 0814  TSH 2.246   Lipid Profile: No results for input(s): "CHOL", "HDL", "LDLCALC", "TRIG", "CHOLHDL", "LDLDIRECT" in the last 72 hours. Anemia Panel: No results for input(s): "VITAMINB12", "FOLATE", "FERRITIN", "TIBC", "IRON", "RETICCTPCT" in the last 72 hours. Urine  analysis:    Component Value Date/Time   COLORURINE YELLOW 09/24/2023 0810   APPEARANCEUR CLEAR 09/24/2023 0810   LABSPEC 1.015 09/24/2023 0810   PHURINE 6.5 09/24/2023 0810   GLUCOSEU NEGATIVE 09/24/2023 0810   HGBUR NEGATIVE 09/24/2023 0810   BILIRUBINUR NEGATIVE 09/24/2023 0810   KETONESUR NEGATIVE 09/24/2023 0810   PROTEINUR NEGATIVE 08/23/2020 1639   UROBILINOGEN 0.2 09/24/2023 0810   NITRITE NEGATIVE 09/24/2023 0810   LEUKOCYTESUR NEGATIVE 09/24/2023 0810   Sepsis Labs: Invalid input(s): "PROCALCITONIN", "LACTICIDVEN"   SIGNED:  Theadore Finger, MD  [facility] 03/19/2024, 2:39 PM

## 2024-03-22 LAB — CULTURE, BLOOD (ROUTINE X 2)
Culture: NO GROWTH
Culture: NO GROWTH
Special Requests: ADEQUATE
Special Requests: ADEQUATE

## 2024-03-23 DIAGNOSIS — N179 Acute kidney failure, unspecified: Secondary | ICD-10-CM | POA: Diagnosis not present

## 2024-03-23 DIAGNOSIS — I129 Hypertensive chronic kidney disease with stage 1 through stage 4 chronic kidney disease, or unspecified chronic kidney disease: Secondary | ICD-10-CM | POA: Diagnosis not present

## 2024-03-23 DIAGNOSIS — I739 Peripheral vascular disease, unspecified: Secondary | ICD-10-CM | POA: Diagnosis not present

## 2024-03-23 DIAGNOSIS — I252 Old myocardial infarction: Secondary | ICD-10-CM | POA: Diagnosis not present

## 2024-03-23 DIAGNOSIS — R3911 Hesitancy of micturition: Secondary | ICD-10-CM | POA: Diagnosis not present

## 2024-03-23 DIAGNOSIS — N1832 Chronic kidney disease, stage 3b: Secondary | ICD-10-CM | POA: Diagnosis not present

## 2024-03-23 DIAGNOSIS — E1122 Type 2 diabetes mellitus with diabetic chronic kidney disease: Secondary | ICD-10-CM | POA: Diagnosis not present

## 2024-03-30 ENCOUNTER — Telehealth: Payer: Self-pay

## 2024-03-30 ENCOUNTER — Other Ambulatory Visit (HOSPITAL_COMMUNITY): Payer: Self-pay

## 2024-03-30 ENCOUNTER — Ambulatory Visit (INDEPENDENT_AMBULATORY_CARE_PROVIDER_SITE_OTHER): Admitting: Internal Medicine

## 2024-03-30 ENCOUNTER — Encounter: Payer: Self-pay | Admitting: Internal Medicine

## 2024-03-30 VITALS — BP 128/62 | HR 55 | Temp 98.2°F | Ht 65.0 in

## 2024-03-30 DIAGNOSIS — J189 Pneumonia, unspecified organism: Secondary | ICD-10-CM

## 2024-03-30 DIAGNOSIS — N184 Chronic kidney disease, stage 4 (severe): Secondary | ICD-10-CM | POA: Diagnosis not present

## 2024-03-30 DIAGNOSIS — E118 Type 2 diabetes mellitus with unspecified complications: Secondary | ICD-10-CM

## 2024-03-30 DIAGNOSIS — N320 Bladder-neck obstruction: Secondary | ICD-10-CM

## 2024-03-30 DIAGNOSIS — I10 Essential (primary) hypertension: Secondary | ICD-10-CM

## 2024-03-30 DIAGNOSIS — I25119 Atherosclerotic heart disease of native coronary artery with unspecified angina pectoris: Secondary | ICD-10-CM

## 2024-03-30 HISTORY — DX: Bladder-neck obstruction: N32.0

## 2024-03-30 MED ORDER — KERENDIA 10 MG PO TABS: 10.0000 mg | ORAL_TABLET | Freq: Every day | ORAL | 5 refills | Status: DC

## 2024-03-30 MED ORDER — TAMSULOSIN HCL 0.4 MG PO CAPS: 0.4000 mg | ORAL_CAPSULE | Freq: Every day | ORAL | 3 refills | Status: DC

## 2024-03-30 MED ORDER — PROMETHAZINE-DM 6.25-15 MG/5ML PO SYRP: 5.0000 mL | ORAL_SOLUTION | Freq: Four times a day (QID) | ORAL | 0 refills | Status: AC | PRN

## 2024-03-30 MED ORDER — CEFDINIR 300 MG PO CAPS: 300.0000 mg | ORAL_CAPSULE | Freq: Two times a day (BID) | ORAL | 0 refills | Status: AC

## 2024-03-30 NOTE — Assessment & Plan Note (Signed)
 Flomax  Rx - use prn

## 2024-03-30 NOTE — Telephone Encounter (Signed)
 Pharmacy Patient Advocate Encounter   Received notification from CoverMyMeds that prior authorization for [name] 10 tabs is required/requested.   Insurance verification completed.   The patient is insured through [employer] ADVANTAGE/RX ADVANCE .   Per test claim: PA required; PA started via CoverMyMeds. KEY [id number] . Waiting for clinical questions to populate.  Submitted clinical questions. Status pending

## 2024-03-30 NOTE — Progress Notes (Signed)
 Subjective:  Patient ID: Harold Waters, male    DOB: 14-Nov-1945  Age: 78 y.o. MRN: [id number]  CC: Hospitalization Follow-up   HPI [name] presents for a recent CAP, ARF, BPH  C/o a new dry cough Dtr Harold Waters  Per hx: "Admit date: 03/17/2024 Discharge date: 03/19/24   Admitted From: Home Disposition: Home Recommendations for Outpatient Follow-up:  Follow up with PCP in 1 week BP meds discontinued on discharge.  Reassess blood pressure and renal function at follow-up Please follow up on the following pending results: None   Home Health: No need identified Equipment/Devices: No need identified   Discharge Condition: Stable CODE STATUS: Full code   Follow-up Information       Harold Waters, Harold Bellman, MD. Schedule an appointment as soon as possible for a visit in 1 week(s).   Specialty: Internal Medicine Contact information: [address] [place] [address] [phone]                          Hospital course 78 year old M with PMH of CAD, PAD s/p bypass in 2020, DM-2, CKD-3B, HTN, anxiety, depression and mild cognitive impairment sent to ED by PCP due to AKI.  Patient saw PCP for persistent cough and diagnosed with CAP for which she was prescribed doxycycline , albuterol  and Tessalon  Perles.  Later, he received a call due to concern for AKI, and was advised to go to ED.   In ED, hypotensive to 82/43 but improved after IV fluid.  Other vital stable.  93% on RA. Na 134. Cr 3.93 (b/l~2.0).  BUN 54.  Bicarb 18.  AG 16.  WBC 6.2. Noncontrast CT chest with bibasilar infiltrates consistent with multifocal pneumonia.  Patient was started on ceftriaxone , Zithromax  and IV fluid, and admitted for multifocal pneumonia, hypotension and AKI.   The next day, patient felt better and AKI improved.  Blood cultures negative.  MRSA PCR screen negative.  Independently ambulated in the hallway without distress.   On the day of discharge, continues to feel better.   AKI improved further.  Felt well and ready to go home.  He is discharged on p.o. Augmentin  for 3 more days and Zithromax  for 2 more days.  Given soft blood pressure, home antihypertensive meds and diuretics discontinued on discharge.  Reassess blood pressure and renal function and resume antihypertensive meds as appropriate outpatient.   See individual problem list below for more.    Problems addressed during this hospitalization Multifocal pneumonia, POA: Initially seen by PCP for persistent cough.  Noncontrast CT with bibasilar infiltrates concerning for multifocal pneumonia.  Pro-Cal 0.25.  Patient's symptoms improved.  Felt well and ready to go home.  Blood cultures NGTD.  MRSA PCR screen negative.  Received ceftriaxone  and Zithromax  for 2 days.  Discharged on p.o. Augmentin  and Zithromax  to complete a total of 5 days course.  Outpatient follow-up with PCP in 1 week.   AKI on CKD-3B: Likely prerenal in the setting of acute illness, hypotension and medications. with IV fluid.  b/l Cr ~2.0-2.2.  AKI resolving. Recent Labs (within last 365 days)            Recent Labs    03/22/23 0859 07/19/23 1219 09/24/23 0810 10/11/23 0816 03/17/24 1429 03/17/24 2045 03/18/24 0814 03/19/24 0803  BUN 28* 29* 22 35* 49* 54* 50* 53*  CREATININE 2.20* 2.07* 2.00* 2.19* 3.59* 3.93* 3.07* 2.37*    - Discontinued home antihypertensive meds and diuretics to  avoid hypotension   Hypotension: Likely due to acute illness and antihypertensive meds.  TSH and cortisol normal.  Hypotension resolved. Discontinued home chlorthalidone, Jardiance , Imdur , Benicar   High anion gap metabolic acidosis:  Lactic acid normal.  Improved.  Anticipate further improvement with improvement of AKI   NIDDM-2 with hyperglycemia: A1c 6.7%.  Discontinued home Jardiance  given AKI and concern for hypotension.  Consider resuming outpatient     Mild hypovolemic hyponatremia: Resolved   History of CAD/HLD: No chest pain.  Serial  troponin negative. -Continue home Plavix , Zetia  and Crestor .     History of PAD s/p bypass in 2020 -Antiplatelet and Crestor  as above   Memory impairment/anxiety/depression: Patient is well-oriented with good insight.. -Continue home meds"          Outpatient Medications Prior to Visit  Medication Sig Dispense Refill   albuterol  (VENTOLIN  HFA) 108 (90 Base) MCG/ACT inhaler Inhale 2 puffs into the lungs every 4 (four) hours as needed for wheezing or shortness of breath. 18 g 0   cholecalciferol (VITAMIN D3) 25 MCG (1000 UNIT) tablet Take 1,000 Units by mouth in the morning.     ezetimibe  (ZETIA ) 10 MG tablet Take 1 tablet (10 mg total) by mouth daily. (Patient taking differently: Take 10 mg by mouth every evening.) 90 tablet 3   memantine  (NAMENDA  XR) 14 MG CP24 24 hr capsule TAKE 1 CAPSULE BY MOUTH NIGHTLY 90 capsule 4   nitroGLYCERIN  (NITROSTAT ) 0.4 MG SL tablet Place 1 tablet under tongue every 5 mins. DO NOT USE MORE THAN 3 TABS 25 tablet 2   traZODone  (DESYREL ) 50 MG tablet TAKE 1/4 TO 1/2 TABLET BY MOUTH AT NIGHT AS NEEDED FOR INSOMNIA (Patient taking differently: Take 25 mg by mouth at bedtime.) 45 tablet 3   vitamin B-12 (CYANOCOBALAMIN ) 500 MCG tablet Take 500 mcg by mouth in the morning.     acetaminophen  (TYLENOL ) 500 MG tablet Take 500 mg by mouth every 6 (six) hours as needed for mild pain (pain score 1-3) or moderate pain (pain score 4-6). (Patient not taking: Reported on 03/30/2024)     empagliflozin  (JARDIANCE ) 10 MG TABS tablet Take 1 tablet (10 mg total) by mouth daily before breakfast. (Patient not taking: Reported on 03/30/2024) 90 tablet 3   rosuvastatin  (CRESTOR ) 20 MG tablet Take 1 tablet (20 mg total) by mouth daily. (Patient taking differently: Take 20 mg by mouth at bedtime.) 90 tablet 3   sertraline  (ZOLOFT ) 25 MG tablet Take 1 tablet (25 mg total) by mouth daily. (Patient not taking: Reported on 03/18/2024) 30 tablet 11   azithromycin  (ZITHROMAX ) 250 MG tablet Take 1  tablet (250 mg total) by mouth daily. (Patient not taking: Reported on 03/30/2024) 2 each 0   benzonatate  (TESSALON ) 100 MG capsule Take 1 capsule (100 mg total) by mouth 2 (two) times daily as needed for cough. (Patient not taking: Reported on 03/30/2024) 20 capsule 0   polyethylene glycol powder (GLYCOLAX /MIRALAX ) 17 GM/SCOOP powder Take 17 g by mouth 2 (two) times daily as needed for moderate constipation. (Patient not taking: Reported on 03/30/2024) 500 g 3   No facility-administered medications prior to visit.    ROS: Review of Systems  Constitutional:  Negative for appetite change, fatigue and unexpected weight change.  HENT:  Negative for congestion, nosebleeds, sneezing, sore throat and trouble swallowing.   Eyes:  Negative for itching and visual disturbance.  Respiratory:  Negative for cough.   Cardiovascular:  Negative for chest pain, palpitations and leg swelling.  Gastrointestinal:  Negative for abdominal distention, blood in stool, diarrhea and nausea.  Genitourinary:  Negative for frequency and hematuria.  Musculoskeletal:  Negative for back pain, gait problem, joint swelling and neck pain.  Skin:  Negative for rash.  Neurological:  Negative for dizziness, tremors, speech difficulty and weakness.  Psychiatric/Behavioral:  Negative for agitation, dysphoric mood and sleep disturbance. The patient is not nervous/anxious.     Objective:  BP 128/62   Pulse (!) 55   Temp 98.2 F (36.8 C) (Oral)   Ht 5\' 5"  (1.651 m)   SpO2 93%   BMI 25.46 kg/m   BP Readings from Last 3 Encounters:  03/30/24 128/62  03/19/24 (!) 108/52  03/17/24 (!) 96/54    Wt Readings from Last 3 Encounters:  03/17/24 153 lb (69.4 kg)  03/17/24 153 lb (69.4 kg)  12/08/23 159 lb (72.1 kg)    Physical Exam Constitutional:      General: He is not in acute distress.    Appearance: He is well-developed.     Comments: NAD  Eyes:     Conjunctiva/sclera: Conjunctivae normal.     Pupils: Pupils are  equal, round, and reactive to light.  Neck:     Thyroid : No thyromegaly.     Vascular: No JVD.  Cardiovascular:     Rate and Rhythm: Normal rate and regular rhythm.     Heart sounds: Normal heart sounds. No murmur heard.    No friction rub. No gallop.  Pulmonary:     Effort: Pulmonary effort is normal. No respiratory distress.     Breath sounds: Normal breath sounds. No wheezing or rales.  Chest:     Chest wall: No tenderness.  Abdominal:     General: Bowel sounds are normal. There is no distension.     Palpations: Abdomen is soft. There is no mass.     Tenderness: There is no abdominal tenderness. There is no guarding or rebound.  Musculoskeletal:        General: No tenderness. Normal range of motion.     Cervical back: Normal range of motion.  Lymphadenopathy:     Cervical: No cervical adenopathy.  Skin:    General: Skin is warm and dry.     Findings: No lesion or rash.  Neurological:     Mental Status: He is alert and oriented to person, place, and time.     Cranial Nerves: No cranial nerve deficit.     Motor: No abnormal muscle tone.     Coordination: Coordination normal.     Gait: Gait normal.     Deep Tendon Reflexes: Reflexes are normal and symmetric.  Psychiatric:        Behavior: Behavior normal.        Thought Content: Thought content normal.        Judgment: Judgment normal.     Lab Results  Component Value Date   WBC 8.2 03/19/2024   HGB 10.6 (L) 03/19/2024   HCT 31.9 (L) 03/19/2024   PLT 219 03/19/2024   GLUCOSE 175 (H) 03/19/2024   CHOL 135 10/11/2023   TRIG 90 10/11/2023   HDL 50 10/11/2023   LDLDIRECT 167 (H) 10/25/2019   LDLCALC 68 10/11/2023   ALT 23 03/17/2024   AST 14 03/17/2024   NA 139 03/19/2024   K 4.7 03/19/2024   CL 113 (H) 03/19/2024   CREATININE 2.37 (H) 03/19/2024   BUN 53 (H) 03/19/2024   CO2 17 (L) 03/19/2024   TSH 2.246 03/18/2024  PSA 9.90 (H) 06/26/2021   INR 0.99 11/28/2018   HGBA1C 6.7 (H) 03/17/2024   MICROALBUR 16.3  (H) 06/01/2022    CT CHEST WO CONTRAST Result Date: 03/17/2024 CLINICAL DATA:  Abnormal chest x-ray EXAM: CT CHEST WITHOUT CONTRAST TECHNIQUE: Multidetector CT imaging of the chest was performed following the standard protocol without IV contrast. RADIATION DOSE REDUCTION: This exam was performed according to the departmental dose-optimization program which includes automated exposure control, adjustment of the mA and/or kV according to patient size and/or use of iterative reconstruction technique. COMPARISON:  Chest x-ray from earlier in the same day, CT from 07/14/23 FINDINGS: Cardiovascular: Limited due to lack of IV contrast. Atherosclerotic calcifications of the aorta are noted without aneurysmal dilatation. Diffuse coronary calcifications are noted. No cardiac enlargement is seen. Mediastinum/Nodes: The thoracic inlet is within normal limits. No hilar or mediastinal adenopathy is noted. The esophagus as visualized is within normal limits. Lungs/Pleura: Lungs are well aerated bilaterally. Bronchial wall thickening is noted diffusely in the lower lobes bilaterally with associated focal infiltrate in the posterior lower lobes bilaterally. These changes could be related to aspiration but more likely secondary to multifocal pneumonia. These are new from the prior PET-CT but similar in appearance to that on current chest x-ray. No parenchymal nodules are seen. No effusion is noted. Upper Abdomen: Visualized upper abdomen is within normal limits. Musculoskeletal: Degenerative changes of the thoracic spine are noted. No acute rib seen. IMPRESSION: Bibasilar infiltrates consistent with multifocal pneumonia. No other acute abnormality is noted. Aortic Atherosclerosis (ICD10-I70.0). Electronically Signed   By: Violeta Grey M.D.   On: 03/17/2024 22:00   DG Chest 2 View Result Date: 03/17/2024 CLINICAL DATA:  Cough. EXAM: CHEST - 2 VIEW COMPARISON:  Chest radiograph dated 08/02/2023. FINDINGS: Left lung base density  similar to prior radiograph may represent chronic atelectasis/scarring. Recurrent pneumonia is not excluded. The right lung is clear. No pleural effusion pneumothorax. Stable cardiac silhouette no acute osseous pathology. IMPRESSION: Left lung base density may represent chronic atelectasis/scarring. Recurrent pneumonia is not excluded. Electronically Signed   By: Angus Bark M.D.   On: 03/17/2024 14:35    Assessment & Plan:   Problem List Items Addressed This Visit     Essential hypertension - Primary   CAP (community acquired pneumonia)   B CAP Cefdinir Rx if relapsed RTC in 4 wks Prom cough syr prn      Relevant Medications   promethazine -dextromethorphan  (PROMETHAZINE -DM) 6.25-15 MG/5ML syrup   cefdinir (OMNICEF) 300 MG capsule   CAD (coronary artery disease)   Relevant Orders   Comprehensive metabolic panel with GFR   CBC with Differential/Platelet   CKD (chronic kidney disease) stage 4, GFR 15-29 ml/min (HCC)   Relevant Orders   Comprehensive metabolic panel with GFR   CBC with Differential/Platelet   Bladder neck obstruction   Flomax  Rx - use prn         Meds ordered this encounter  Medications   tamsulosin  (FLOMAX ) 0.4 MG CAPS capsule    Sig: Take 1 capsule (0.4 mg total) by mouth daily.    Dispense:  30 capsule    Refill:  3   promethazine -dextromethorphan  (PROMETHAZINE -DM) 6.25-15 MG/5ML syrup    Sig: Take 5 mLs by mouth 4 (four) times daily as needed for cough.    Dispense:  240 mL    Refill:  0   cefdinir (OMNICEF) 300 MG capsule    Sig: Take 1 capsule (300 mg total) by mouth 2 (two) times daily.  Dispense:  20 capsule    Refill:  0   Finerenone ([name]) 10 MG TABS    Sig: Take 1 tablet (10 mg total) by mouth daily.    Dispense:  30 tablet    Refill:  5      Follow-up: Return in about 4 weeks (around 04/27/2024) for a follow-up visit.  Anitra Barn, MD

## 2024-03-30 NOTE — Assessment & Plan Note (Signed)
 B CAP Cefdinir Rx if relapsed RTC in 4 wks Prom cough syr prn

## 2024-03-31 ENCOUNTER — Other Ambulatory Visit: Payer: Self-pay | Admitting: Internal Medicine

## 2024-03-31 NOTE — Telephone Encounter (Signed)
 Pharmacy Patient Advocate Encounter  Received notification from [employer] that Prior Authorization for Harold Waters 10 has been DENIED.  Full denial letter will be uploaded to the media tab. See denial reason below. I used the renal function panel and comprehensive metabolic panel. The UACR value was not in any of the labs.  PA #/Case ID/Reference #: [id number]

## 2024-04-06 ENCOUNTER — Other Ambulatory Visit: Payer: Self-pay | Admitting: Internal Medicine

## 2024-04-08 ENCOUNTER — Other Ambulatory Visit: Payer: Self-pay | Admitting: Nurse Practitioner

## 2024-04-08 DIAGNOSIS — J189 Pneumonia, unspecified organism: Secondary | ICD-10-CM

## 2024-04-11 ENCOUNTER — Other Ambulatory Visit: Payer: Self-pay | Admitting: Vascular Surgery

## 2024-04-11 ENCOUNTER — Encounter: Payer: Self-pay | Admitting: Internal Medicine

## 2024-04-17 ENCOUNTER — Ambulatory Visit: Admitting: Physician Assistant

## 2024-04-17 ENCOUNTER — Encounter: Payer: Self-pay | Admitting: Physician Assistant

## 2024-04-17 VITALS — BP 196/72 | HR 66 | Resp 20 | Ht 65.0 in | Wt 155.0 lb

## 2024-04-17 DIAGNOSIS — F0393 Unspecified dementia, unspecified severity, with mood disturbance: Secondary | ICD-10-CM

## 2024-04-17 NOTE — Progress Notes (Signed)
 Assessment/Plan:   Mild dementia of unclear etiology, concern for vascular.  [facility] Freer is a very pleasant 78 y.o. RH male with a history of uncontrolled hypertension, hyperlipidemia, PAD, history of migraines with aura, recent multifocal pneumonia May 2025, severe sleep apnea noncompliant with CPAP, history of prostate cancer, status post seed implant seen today in follow up for memory loss. Patient is currently on memantine  XR 14 mg nightly after he was unable to tolerate 21 mg due to increase sleepiness .  MMSE today 30/30.  Memory is stable.  He is able to participate in his ADLs and drive without difficulty.  Mood is controlled.    Follow up in 6  months. Continue memantine  XR 14 mg nightly Continue trazodone  for sleep, 25 mg nightly, side effects discussed Recommend having MRI of the brain as the last one was done on 2020.  This was ordered during his prior visit, and is yet to be done  Follow-up with pulmonary for OSA-CPAP Recommend good control of her cardiovascular risk factors, continue Plavix  and aspirin  Continue to control mood as per PCP Monitor driving     Subjective:    This patient is here alone.  Previous records as well as any outside records available were reviewed prior to todays visit. Patient was last seen on 12/01/2023 with MMSE 28/30   Any changes in memory since last visit? About the same.  He continues to write a list before going to a store to prevent forgetting the items.   LTM is good. Repeats oneself?  Endorsed Disoriented when walking into a room?  Endorsed, when I don't sleep well he is aware of it.   Leaving objects?  May misplace things such as glasses but not in unusual places   Wandering behavior?  denies   Any personality changes since last visit?  Before, he may have abrupt temper, but not often or different from prior. Any worsening depression?:  Denies.   Hallucinations or paranoia?  Denies.   Seizures? denies    Any sleep  changes?  He falls asleep on the couch, then goes to the bed and then after 2 I cannot go back to sleep. He is going to try to take trazodone  when he wakes up at 2 am.  Denies vivid dreams, REM behavior or sleepwalking   Sleep apnea?   And worse, noncompliant with his CPAP. Any hygiene concerns? Denies.  Independent of bathing and dressing?  Endorsed  Does the patient needs help with medications?  Daughter is in charge Who is in charge of the finances? Daughter  is in charge Any changes in appetite? I gained more weight. Drinks plenty water.      Patient have trouble swallowing? Denies.   Does the patient cook? No Any headaches?   denies   Any vision changes?Denies Chronic back pain  denies.   Ambulates with difficulty?  Has arthritic pain, which may limit mobility Recent falls or head injuries? Denies.     Unilateral weakness, numbness or tingling? denies   Any tremors?  Denies    Any anosmia?  Denies   Any incontinence of urine?  Endorsed   Any bowel dysfunction?   Denies      Patient lives with his wife  Does the patient drive?  Yes, he may take the wrong exit, occasionally    Continues to work, running cleaning services part-time.  Daughter works with him  side-by-side    History on Initial Assessment 08/23/2019: This is a pleasant  78 year old right-handed man with a history of hypertension, hyperlipidemia, PAD, presenting for evaluation of memory loss. He feels that memory changes started soon after his surgery in February 2020 where he had a left femoral to AK popliteal artery bypass under general anesthesia.  Prior to this, he had minor memory changes, but soon after surgery he noticed that he was unable to do things that he could do easily in the past such as math. He used to do large numbers in the back of his head, but has more difficulty now. He lives with his wife who constantly says something about his memory saying you forget everything, which upsets him since this was not  happening prior to the surgery. He denies getting lost driving but has missed his exit a few times because he is not focusing or forgets where he is going. He has forgotten bill payments and has had late charges (new as well). He has been forgetting his medications, his wife now helps him. He loses things constantly, his phone is lost all the time. He has put things for the pantry in the freezer. He denies leaving the stove on. He has word-finding difficulties and forgets names more. 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 He has also had a change in sleep pattern, he has always had sleep difficulties getting an average of 4 hours of sleep, but since the surgery he would only get 2-3 hours of sleep. With Trazodone  1 tab qhs he gets 4 hours. He has daytime drowsiness and would drag his feet because he is so tired. Mood is good. No family history of dementia, no history of significant head injuries or alcohol use.   He gets dizzy when walking or changing clothes, no falls. He recalls an episode of double vision 3-4 years ago, this has resolved. He has occasional difficulty swallowing water. He has some numbness in both hands. He denies any headaches, focal numbness/tingling/weakness, bowel/bladder dysfunction, anosmia, or tremors. He denies any prior history of stroke, head CT done in 07/2019 showed an old left posterior parietal infarct and old left superior cerebellar lacunar infarcts, mild chronic microvascular disease, no acute change.     MRI brain showed mild diffuse atrophy and moderate chronic microvascular disease, chronic small strokes in the left occipital lobe, bilateral cerebellar hemispheres and thalami.     PREVIOUS MEDICATIONS: Memantine  10 mg nightly    CURRENT MEDICATIONS:  Outpatient Encounter Medications as of 04/17/2024  Medication Sig   acetaminophen   (TYLENOL ) 500 MG tablet Take 500 mg by mouth every 6 (six) hours as needed for mild pain (pain score 1-3) or moderate pain (pain score 4-6). (Patient not taking: Reported on 03/30/2024)   albuterol  (VENTOLIN  HFA) 108 (90 Base) MCG/ACT inhaler INHALE 2 PUFFS INTO THE LUNGS EVERY 4 HOURS AS NEEDED FOR WHEEZING OR SHORTNESS OF BREATH.   cefdinir  (OMNICEF ) 300 MG capsule Take 1 capsule (300 mg total) by mouth 2 (two) times daily.   cholecalciferol (VITAMIN D3) 25 MCG (1000 UNIT) tablet Take 1,000 Units by mouth in the morning.   clopidogrel  (PLAVIX ) 75 MG tablet TAKE 1 TABLET BY MOUTH EVERY DAY   empagliflozin  (JARDIANCE ) 10 MG TABS tablet Take 1 tablet (10 mg total) by mouth daily before breakfast. (Patient not taking: Reported on 03/30/2024)   ezetimibe  (ZETIA ) 10 MG tablet Take 1 tablet (10  mg total) by mouth daily. (Patient taking differently: Take 10 mg by mouth every evening.)   [name]  10 MG TABS TAKE 1 TABLET BY MOUTH EVERY DAY   memantine  (NAMENDA  XR) 14 MG CP24 24 hr capsule TAKE 1 CAPSULE BY MOUTH NIGHTLY   nitroGLYCERIN  (NITROSTAT ) 0.4 MG SL tablet Place 1 tablet under tongue every 5 mins. DO NOT USE MORE THAN 3 TABS   promethazine -dextromethorphan  (PROMETHAZINE -DM) 6.25-15 MG/5ML syrup Take 5 mLs by mouth 4 (four) times daily as needed for cough.   rosuvastatin  (CRESTOR ) 20 MG tablet Take 1 tablet (20 mg total) by mouth daily. (Patient taking differently: Take 20 mg by mouth at bedtime.)   sertraline  (ZOLOFT ) 25 MG tablet Take 1 tablet (25 mg total) by mouth daily. (Patient not taking: Reported on 03/18/2024)   tamsulosin  (FLOMAX ) 0.4 MG CAPS capsule Take 1 capsule (0.4 mg total) by mouth daily.   traZODone  (DESYREL ) 50 MG tablet TAKE 1/4 TO 1/2 TABLET BY MOUTH AT NIGHT AS NEEDED FOR INSOMNIA (Patient taking differently: Take 25 mg by mouth at bedtime.)   vitamin B-12 (CYANOCOBALAMIN ) 500 MCG tablet Take 500 mcg by mouth in the morning.   No facility-administered encounter medications on file  as of 04/17/2024.       12/01/2023    3:00 PM 07/19/2023   11:00 AM 05/12/2023   12:00 PM  MMSE - Mini Mental State Exam  Orientation to time 5 5 5   Orientation to Place 5 4 5   Registration 3 3 3   Attention/ Calculation 4 0 5  Recall 3 3 3   Language- name 2 objects 2 2 2   Language- repeat 1 1 1   Language- follow 3 step command 3 3 3   Language- read & follow direction 1 1 1   Write a sentence 1 1 1   Copy design 0 0 1  Total score 28 23 30        No data to display          Objective:     PHYSICAL EXAMINATION:    VITALS:  There were no vitals filed for this visit.  GEN:  The patient appears stated age and is in NAD. HEENT:  Normocephalic, atraumatic.   Neurological examination:  General: NAD, well-groomed, appears stated age. Orientation: The patient is alert. Oriented to person, place and date Cranial nerves: There is good facial symmetry.The speech is fluent and clear. No aphasia or dysarthria. Fund of knowledge is appropriate. Recent and remote memory are impaired. Attention and concentration are reduced. Able to name objects and repeat phrases.  Hearing is intact to conversational tone.   Sensation: Sensation is intact to light touch throughout Motor: Strength is at least antigravity x4. DTR's 2/4 in UE/LE     Movement examination: Tone: There is normal tone in the UE/LE Abnormal movements:  no tremor.  No myoclonus.  No asterixis.   Coordination:  There is no decremation with RAM's. Normal finger to nose  Gait and Station: The patient has no  difficulty arising out of a deep-seated chair without the use of the hands. The patient's stride length is good.  Gait is cautious and narrow.    Thank you for allowing us  the opportunity to participate in the care of this nice patient. Please do not hesitate to contact us  for any questions or concerns.   Total time spent on today's visit was 27 minutes dedicated to this patient today, preparing to see patient, examining  the patient, ordering tests and/or medications and counseling the  patient, documenting clinical information in the EHR or other health record, independently interpreting results and communicating results to the patient/family, discussing treatment and goals, answering patient's questions and coordinating care.  Cc:  Plotnikov, Aleksei V, MD  Tex Filbert 04/17/2024 5:35 AM

## 2024-04-17 NOTE — Addendum Note (Signed)
 Addended by: Alma Muegge V on: 04/17/2024 10:22 PM   Modules accepted: Orders

## 2024-04-17 NOTE — Telephone Encounter (Signed)
 I will order the test uACR Thanks

## 2024-04-17 NOTE — Patient Instructions (Addendum)
 It was a pleasure to see you today at our office.   Recommendations:   Continue Memantine  XR14 mg at night . Side effects were discussed  Follow up sleep apnea with Dr. Georgia Kipper regarding CPAP Continue  trazodone  for sleep Follow up Jan 2 at 11:30   Whom to call:  Memory  decline, memory medications: Call our office [phone]   For psychiatric meds, mood meds: Please have your primary care physician manage these medications.      For assessment of decision of mental capacity and competency:  Call Dr. Laverne Potter, geriatric psychiatrist at [phone]  For guidance in geriatric dementia issues please call [facility] [phone]     If you have any severe symptoms of a stroke, or other severe issues such as confusion,severe chills or fever, etc call 911 or go to the ER as you may need to be evaluated further             RECOMMENDATIONS FOR ALL PATIENTS WITH MEMORY PROBLEMS: 1. Continue to exercise (Recommend 30 minutes of walking everyday, or 3 hours every week) 2. Increase social interactions - continue going to [place] and enjoy social gatherings with friends and family 3. Eat healthy, avoid fried foods and eat more fruits and vegetables 4. Maintain adequate blood pressure, blood sugar, and blood cholesterol level. Reducing the risk of stroke and cardiovascular disease also helps promoting better memory. 5. Avoid stressful situations. Live a simple life and avoid aggravations. Organize your time and prepare for the next day in anticipation. 6. Sleep well, avoid any interruptions of sleep and avoid any distractions in the bedroom that may interfere with adequate sleep quality 7. Avoid sugar, avoid sweets as there is a strong link between excessive sugar intake, diabetes, and cognitive impairment We discussed the Mediterranean diet, which has been shown to help patients reduce the risk of progressive memory disorders and reduces cardiovascular risk. This  includes eating fish, eat fruits and green leafy vegetables, nuts like almonds and hazelnuts, walnuts, and also use olive oil. Avoid fast foods and fried foods as much as possible. Avoid sweets and sugar as sugar use has been linked to worsening of memory function.  There is always a concern of gradual progression of memory problems. If this is the case, then we may need to adjust level of care according to patient needs. Support, both to the patient and caregiver, should then be put into place.    FALL PRECAUTIONS: Be cautious when walking. Scan the area for obstacles that may increase the risk of trips and falls. When getting up in the mornings, sit up at the edge of the bed for a few minutes before getting out of bed. Consider elevating the bed at the head end to avoid drop of blood pressure when getting up. Walk always in a well-lit room (use night lights in the walls). Avoid area rugs or power cords from appliances in the middle of the walkways. Use a walker or a cane if necessary and consider physical therapy for balance exercise. Get your eyesight checked regularly.  FINANCIAL OVERSIGHT: Supervision, especially oversight when making financial decisions or transactions is also recommended.  HOME SAFETY: Consider the safety of the kitchen when operating appliances like stoves, microwave oven, and blender. Consider having supervision and share cooking responsibilities until no longer able to participate in those. Accidents with firearms and other hazards in the house should be identified and addressed as well.   ABILITY TO BE LEFT ALONE: If patient  is unable to contact 911 operator, consider using LifeLine, or when the need is there, arrange for someone to stay with patients. Smoking is a fire hazard, consider supervision or cessation. Risk of wandering should be assessed by caregiver and if detected at any point, supervision and safe proof recommendations should be instituted.  MEDICATION  SUPERVISION: Inability to self-administer medication needs to be constantly addressed. Implement a mechanism to ensure safe administration of the medications.   DRIVING: Regarding driving, in patients with progressive memory problems, driving will be impaired. We advise to have someone else do the driving if trouble finding directions or if minor accidents are reported. Independent driving assessment is available to determine safety of driving.   If you are interested in the driving assessment, you can contact the following:  [employer] in [place] [phone]  [facility] [phone]  [facility] [phone]  [place] [phone] or [phone]  MRI at [facility] [phone]

## 2024-04-19 NOTE — Telephone Encounter (Signed)
 LVM for Pt to call the office.Harold AasAaron AasWhen Pt.calls please inform him needs to come give a urine sample to the lab.

## 2024-04-20 DIAGNOSIS — N1832 Chronic kidney disease, stage 3b: Secondary | ICD-10-CM | POA: Diagnosis not present

## 2024-04-26 DIAGNOSIS — N1832 Chronic kidney disease, stage 3b: Secondary | ICD-10-CM | POA: Diagnosis not present

## 2024-04-26 DIAGNOSIS — I129 Hypertensive chronic kidney disease with stage 1 through stage 4 chronic kidney disease, or unspecified chronic kidney disease: Secondary | ICD-10-CM | POA: Diagnosis not present

## 2024-04-26 DIAGNOSIS — E1122 Type 2 diabetes mellitus with diabetic chronic kidney disease: Secondary | ICD-10-CM | POA: Diagnosis not present

## 2024-04-27 ENCOUNTER — Ambulatory Visit: Admitting: Internal Medicine

## 2024-04-27 ENCOUNTER — Encounter: Payer: Self-pay | Admitting: Internal Medicine

## 2024-04-27 VITALS — BP 132/84 | HR 55 | Temp 98.0°F | Ht 65.0 in | Wt 154.0 lb

## 2024-04-27 DIAGNOSIS — N184 Chronic kidney disease, stage 4 (severe): Secondary | ICD-10-CM | POA: Diagnosis not present

## 2024-04-27 DIAGNOSIS — R413 Other amnesia: Secondary | ICD-10-CM | POA: Diagnosis not present

## 2024-04-27 DIAGNOSIS — E118 Type 2 diabetes mellitus with unspecified complications: Secondary | ICD-10-CM

## 2024-04-27 DIAGNOSIS — I10 Essential (primary) hypertension: Secondary | ICD-10-CM | POA: Diagnosis not present

## 2024-04-27 DIAGNOSIS — M2550 Pain in unspecified joint: Secondary | ICD-10-CM

## 2024-04-27 DIAGNOSIS — I25119 Atherosclerotic heart disease of native coronary artery with unspecified angina pectoris: Secondary | ICD-10-CM | POA: Diagnosis not present

## 2024-04-27 LAB — MICROALBUMIN / CREATININE URINE RATIO
Creatinine,U: 68.1 mg/dL
Microalb Creat Ratio: 597.6 mg/g — ABNORMAL HIGH (ref 0.0–30.0)
Microalb, Ur: 40.7 mg/dL — ABNORMAL HIGH (ref 0.0–1.9)

## 2024-04-27 LAB — COMPREHENSIVE METABOLIC PANEL WITH GFR
ALT: 26 U/L (ref 0–53)
AST: 18 U/L (ref 0–37)
Albumin: 4.2 g/dL (ref 3.5–5.2)
Alkaline Phosphatase: 48 U/L (ref 39–117)
BUN: 24 mg/dL — ABNORMAL HIGH (ref 6–23)
CO2: 30 meq/L (ref 19–32)
Calcium: 9.2 mg/dL (ref 8.4–10.5)
Chloride: 105 meq/L (ref 96–112)
Creatinine, Ser: 1.89 mg/dL — ABNORMAL HIGH (ref 0.40–1.50)
GFR: 33.61 mL/min — ABNORMAL LOW (ref >60.00)
Glucose, Bld: 91 mg/dL (ref 70–99)
Potassium: 4.2 meq/L (ref 3.5–5.1)
Sodium: 139 meq/L (ref 135–145)
Total Bilirubin: 0.6 mg/dL (ref 0.2–1.2)
Total Protein: 6.5 g/dL (ref 6.0–8.3)

## 2024-04-27 LAB — CBC WITH DIFFERENTIAL/PLATELET
Basophils Absolute: 0 10*3/uL (ref 0.0–0.1)
Basophils Relative: 0.6 % (ref 0.0–3.0)
Eosinophils Absolute: 0.4 10*3/uL (ref 0.0–0.7)
Eosinophils Relative: 8.1 % — ABNORMAL HIGH (ref 0.0–5.0)
HCT: 36.2 % — ABNORMAL LOW (ref 39.0–52.0)
Hemoglobin: 11.9 g/dL — ABNORMAL LOW (ref 13.0–17.0)
Lymphocytes Relative: 18 % (ref 12.0–46.0)
Lymphs Abs: 1 10*3/uL (ref 0.7–4.0)
MCHC: 32.9 g/dL (ref 30.0–36.0)
MCV: 89 fl (ref 78.0–100.0)
Monocytes Absolute: 0.5 10*3/uL (ref 0.1–1.0)
Monocytes Relative: 9.5 % (ref 3.0–12.0)
Neutro Abs: 3.4 10*3/uL (ref 1.4–7.7)
Neutrophils Relative %: 63.8 % (ref 43.0–77.0)
Platelets: 178 10*3/uL (ref 150.0–400.0)
RBC: 4.07 Mil/uL — ABNORMAL LOW (ref 4.22–5.81)
RDW: 14.8 % (ref 11.5–15.5)
WBC: 5.3 10*3/uL (ref 4.0–10.5)

## 2024-04-27 MED ORDER — CLONIDINE HCL 0.1 MG PO TABS: 0.1000 mg | ORAL_TABLET | Freq: Three times a day (TID) | ORAL | 3 refills | Status: AC | PRN

## 2024-04-27 NOTE — Assessment & Plan Note (Signed)
 Chronic hand OA B

## 2024-04-27 NOTE — Assessment & Plan Note (Signed)
 BP is good Cont w/meds - list reviewed

## 2024-04-27 NOTE — Progress Notes (Signed)
 Subjective:  Patient ID: Harold Waters, male    DOB: Dec 16, 1945  Age: 78 y.o. MRN: [id number]  CC: Medical Management of Chronic Issues (Pt BP has been elevating since been taken off of BP meds by Dr. Dalene due to pts kidney's not functioning properly and is needing a plan about what he can take for his BP)   HPI Harold Waters presents for HTN, CRF, DM He is here w/his dtr Harold Waters  was $800  Outpatient Medications Prior to Visit  Medication Sig Dispense Refill   acetaminophen  (TYLENOL ) 500 MG tablet Take 500 mg by mouth every 6 (six) hours as needed for mild pain (pain score 1-3) or moderate pain (pain score 4-6).     albuterol  (VENTOLIN  HFA) 108 (90 Base) MCG/ACT inhaler INHALE 2 PUFFS INTO THE LUNGS EVERY 4 HOURS AS NEEDED FOR WHEEZING OR SHORTNESS OF BREATH. 18 each 2   cefdinir  (OMNICEF ) 300 MG capsule Take 1 capsule (300 mg total) by mouth 2 (two) times daily. 20 capsule 0   cholecalciferol (VITAMIN D3) 25 MCG (1000 UNIT) tablet Take 1,000 Units by mouth in the morning.     clopidogrel  (PLAVIX ) 75 MG tablet TAKE 1 TABLET BY MOUTH EVERY DAY 90 tablet 3   empagliflozin  (JARDIANCE ) 10 MG TABS tablet Take 1 tablet (10 mg total) by mouth daily before breakfast. 90 tablet 3   ezetimibe  (ZETIA ) 10 MG tablet Take 1 tablet (10 mg total) by mouth daily. 90 tablet 3   Harold Waters  10 MG TABS TAKE 1 TABLET BY MOUTH EVERY DAY 30 tablet 5   memantine  (NAMENDA  XR) 14 MG CP24 24 hr capsule TAKE 1 CAPSULE BY MOUTH NIGHTLY 90 capsule 4   nitroGLYCERIN  (NITROSTAT ) 0.4 MG SL tablet Place 1 tablet under tongue every 5 mins. DO NOT USE MORE THAN 3 TABS 25 tablet 2   promethazine -dextromethorphan  (PROMETHAZINE -DM) 6.25-15 MG/5ML syrup Take 5 mLs by mouth 4 (four) times daily as needed for cough. 240 mL 0   rosuvastatin  (CRESTOR ) 20 MG tablet Take 1 tablet (20 mg total) by mouth daily. 90 tablet 3   tamsulosin  (FLOMAX ) 0.4 MG CAPS capsule Take 1 capsule (0.4 mg total) by mouth daily. 30 capsule 3    traZODone  (DESYREL ) 50 MG tablet TAKE 1/4 TO 1/2 TABLET BY MOUTH AT NIGHT AS NEEDED FOR INSOMNIA 45 tablet 3   vitamin B-12 (CYANOCOBALAMIN ) 500 MCG tablet Take 500 mcg by mouth in the morning.     sertraline  (ZOLOFT ) 25 MG tablet Take 1 tablet (25 mg total) by mouth daily. (Patient not taking: Reported on 04/27/2024) 30 tablet 11   No facility-administered medications prior to visit.    ROS: Review of Systems  Objective:  BP 132/84   Pulse (!) 55   Temp 98 F (36.7 C) (Oral)   Ht 5' 5 (1.651 m)   Wt 154 lb (69.9 kg)   SpO2 98%   BMI 25.63 kg/m   BP Readings from Last 3 Encounters:  04/27/24 132/84  04/17/24 (!) 196/72  03/30/24 128/62    Wt Readings from Last 3 Encounters:  04/27/24 154 lb (69.9 kg)  04/17/24 155 lb (70.3 kg)  03/17/24 153 lb (69.4 kg)    Physical Exam  Lab Results  Component Value Date   WBC 8.2 03/19/2024   HGB 10.6 (L) 03/19/2024   HCT 31.9 (L) 03/19/2024   PLT 219 03/19/2024   GLUCOSE 175 (H) 03/19/2024   CHOL 135 10/11/2023   TRIG 90 10/11/2023   HDL 50  10/11/2023   LDLDIRECT 167 (H) 10/25/2019   LDLCALC 68 10/11/2023   ALT 23 03/17/2024   AST 14 03/17/2024   NA 139 03/19/2024   K 4.7 03/19/2024   CL 113 (H) 03/19/2024   CREATININE 2.37 (H) 03/19/2024   BUN 53 (H) 03/19/2024   CO2 17 (L) 03/19/2024   TSH 2.246 03/18/2024   PSA 9.90 (H) 06/26/2021   INR 0.99 11/28/2018   HGBA1C 6.7 (H) 03/17/2024   MICROALBUR 16.3 (H) 06/01/2022    CT CHEST WO CONTRAST Result Date: 03/17/2024 CLINICAL DATA:  Abnormal chest x-ray EXAM: CT CHEST WITHOUT CONTRAST TECHNIQUE: Multidetector CT imaging of the chest was performed following the standard protocol without IV contrast. RADIATION DOSE REDUCTION: This exam was performed according to the departmental dose-optimization program which includes automated exposure control, adjustment of the mA and/or kV according to patient size and/or use of iterative reconstruction technique. COMPARISON:  Chest  x-ray from earlier in the same day, CT from 07/14/23 FINDINGS: Cardiovascular: Limited due to lack of IV contrast. Atherosclerotic calcifications of the aorta are noted without aneurysmal dilatation. Diffuse coronary calcifications are noted. No cardiac enlargement is seen. Mediastinum/Nodes: The thoracic inlet is within normal limits. No hilar or mediastinal adenopathy is noted. The esophagus as visualized is within normal limits. Lungs/Pleura: Lungs are well aerated bilaterally. Bronchial wall thickening is noted diffusely in the lower lobes bilaterally with associated focal infiltrate in the posterior lower lobes bilaterally. These changes could be related to aspiration but more likely secondary to multifocal pneumonia. These are new from the prior PET-CT but similar in appearance to that on current chest x-ray. No parenchymal nodules are seen. No effusion is noted. Upper Abdomen: Visualized upper abdomen is within normal limits. Musculoskeletal: Degenerative changes of the thoracic spine are noted. No acute rib seen. IMPRESSION: Bibasilar infiltrates consistent with multifocal pneumonia. No other acute abnormality is noted. Aortic Atherosclerosis (ICD10-I70.0). Electronically Signed   By: Oneil Devonshire M.D.   On: 03/17/2024 22:00   DG Chest 2 View Result Date: 03/17/2024 CLINICAL DATA:  Cough. EXAM: CHEST - 2 VIEW COMPARISON:  Chest radiograph dated 08/02/2023. FINDINGS: Left lung base density similar to prior radiograph may represent chronic atelectasis/scarring. Recurrent pneumonia is not excluded. The right lung is clear. No pleural effusion pneumothorax. Stable cardiac silhouette no acute osseous pathology. IMPRESSION: Left lung base density may represent chronic atelectasis/scarring. Recurrent pneumonia is not excluded. Electronically Signed   By: Vanetta Chou M.D.   On: 03/17/2024 14:35    Assessment & Plan:   Problem List Items Addressed This Visit     Essential hypertension   BP is  good Cont w/meds - list reviewed       Relevant Medications   cloNIDine  (CATAPRES ) 0.1 MG tablet   Arthralgia   Chronic hand OA B      CAD (coronary artery disease)   Relevant Medications   cloNIDine  (CATAPRES ) 0.1 MG tablet   Diabetes mellitus type 2 with complications (HCC)   Memory loss    Namenda  XR Seeing Camie Sevin, NP      CKD (chronic kidney disease) stage 4, GFR 15-29 ml/min (HCC) - Primary   Harold Waters  - trying to approve the Rx Check UACR Harold Waters  was $800         Meds ordered this encounter  Medications   cloNIDine  (CATAPRES ) 0.1 MG tablet    Sig: Take 1 tablet (0.1 mg total) by mouth 3 (three) times daily as needed.    Dispense:  90 tablet  Refill:  3      Follow-up: No follow-ups on file.  Marolyn Noel, MD

## 2024-04-27 NOTE — Assessment & Plan Note (Addendum)
 [name]  - trying to approve the Rx Check UACR [name]  was $800

## 2024-04-27 NOTE — Assessment & Plan Note (Addendum)
 Namenda  XR Seeing Camie Sevin, NP

## 2024-04-28 ENCOUNTER — Ambulatory Visit: Payer: Self-pay | Admitting: Internal Medicine

## 2024-05-03 ENCOUNTER — Telehealth: Payer: Self-pay

## 2024-05-03 ENCOUNTER — Other Ambulatory Visit (HOSPITAL_COMMUNITY): Payer: Self-pay

## 2024-05-03 NOTE — Telephone Encounter (Signed)
 Information has been sent to clinical pharmacist for appeals review. It may take 5-7 days to prepare the necessary documentation to request the appeal from the insurance.

## 2024-05-03 NOTE — Telephone Encounter (Signed)
 Sent to PA team per PCP request.

## 2024-05-03 NOTE — Telephone Encounter (Signed)
 Could not resubmit PA due to denial on file.   Information has been sent to clinical pharmacist for appeals review. It may take 5-7 days to prepare the necessary documentation to request the appeal from the insurance.

## 2024-05-03 NOTE — Telephone Encounter (Signed)
 Please send a PA for Can we resubmit PA for [name]  with his new test results?  Thank you, Dr. Garald

## 2024-05-08 ENCOUNTER — Telehealth: Payer: Self-pay | Admitting: Pharmacist

## 2024-05-08 NOTE — Telephone Encounter (Signed)
 An E-Appeal has been submitted for [name] . Will advise when response is received, please be advised that most companies may take 30 days to make a decision. Appeal letter and supporting documentation we uploaded and submitted via [employer] website on 05/08/2024.  Thank you, Devere Pandy, PharmD Clinical Pharmacist  [place]  Direct Dial: [phone]

## 2024-05-09 NOTE — Telephone Encounter (Signed)
 The insurance has approved the appeal for [name] :    Thank you, Devere Pandy, PharmD Clinical Pharmacist  [place]  Direct Dial: [phone]

## 2024-05-10 ENCOUNTER — Ambulatory Visit: Payer: PPO | Admitting: Physician Assistant

## 2024-05-10 ENCOUNTER — Ambulatory Visit (HOSPITAL_COMMUNITY)
Admission: RE | Admit: 2024-05-10 | Discharge: 2024-05-10 | Disposition: A | Discharge status: Home / Self Care | Payer: PPO | Source: Ambulatory Visit | Attending: Vascular Surgery | Admitting: Vascular Surgery

## 2024-05-10 ENCOUNTER — Ambulatory Visit (HOSPITAL_BASED_OUTPATIENT_CLINIC_OR_DEPARTMENT_OTHER)
Admission: RE | Admit: 2024-05-10 | Discharge: 2024-05-10 | Disposition: A | Discharge status: Home / Self Care | Payer: PPO | Source: Ambulatory Visit | Attending: Vascular Surgery | Admitting: Vascular Surgery

## 2024-05-10 VITALS — BP 165/69 | HR 53 | Temp 97.9°F | Ht 65.0 in | Wt 155.6 lb

## 2024-05-10 DIAGNOSIS — M25561 Pain in right knee: Secondary | ICD-10-CM | POA: Diagnosis not present

## 2024-05-10 DIAGNOSIS — M25562 Pain in left knee: Secondary | ICD-10-CM

## 2024-05-10 DIAGNOSIS — G8929 Other chronic pain: Secondary | ICD-10-CM | POA: Insufficient documentation

## 2024-05-10 DIAGNOSIS — I739 Peripheral vascular disease, unspecified: Secondary | ICD-10-CM | POA: Insufficient documentation

## 2024-05-10 DIAGNOSIS — I872 Venous insufficiency (chronic) (peripheral): Secondary | ICD-10-CM

## 2024-05-10 LAB — VAS US ABI WITH/WO TBI
Left ABI: 1.03
Right ABI: 1.03

## 2024-05-10 NOTE — Progress Notes (Signed)
 Office Note     CC:  follow up Requesting Provider:  Garald Karlynn GAILS, MD  HPI: Harold Waters is a 78 y.o. (1945/11/27) male who presents for surveillance follow up of PAD. He has history of left common femoral to above knee popliteal artery bypass with vein by Dr. Sheree on 12/06/18. He has had some persistent left lower extremity edema since his surgery. This has been managed with compression stockings. No recurrent rest pain, claudication or tissue loss. On follow up in May of 2024 he was noted to have some decreased velocities within his bypass graft that were concerning for threatened bypass. He was therefore underwent Aortogram, Arteriogram of LLE with Dr. Sheree. At that time he had lazer atherectomy of the left femoral to AK popliteal bypass with stenting of the bypass. Three vessel runoff noted. Following this intervention he was doing well at his last visit.  Today he is here with his daughter. He reports overall he feels really good. He does continue to have some swelling in his legs. He wears diabetic socks daily. He says upon waking his swelling is resolved. Also improved with intermittent elevation. He denies any pain in his legs on ambulation or rest. No tissue loss. He remains very active. He is limited by pain in his knees. He says he has some good and bad days but overall feels well. He is medically managed on Plavix  and statin  Past Medical History:  Diagnosis Date   Angina    Chronic stomach ulcer    get them off and on (09/19/2018)   Coronary atherosclerosis of unspecified type of vessel, native or graft    Diverticulosis    DVT of lower extremity (deep venous thrombosis) (HCC) ~ 2010   LLE   Erosive gastritis    Essential hypertension, benign    GERD (gastroesophageal reflux disease)    Headache(784.0) 02/18/12   lately   History of kidney stones    Internal hemorrhoids    Lower back pain    Myocardial infarction [facility]) 1997   Osteoarthritis    Other and  unspecified hyperlipidemia    PAD (peripheral artery disease) (HCC)    with ABI's 0.8 on the right and 0.86 on the left   Pneumonia 1957   PSVT (paroxysmal supraventricular tachycardia) (HCC) 02/18/12   Shortness of breath    lying down   Sleep apnea    does not wear c-pap; pt does not recall this hx on 09/19/2018    Past Surgical History:  Procedure Laterality Date   ABDOMINAL AORTOGRAM W/LOWER EXTREMITY N/A 03/22/2023   Procedure: ABDOMINAL AORTOGRAM W/LOWER EXTREMITY;  Surgeon: Sheree Penne Bruckner, MD;  Location: [facility] INVASIVE CV LAB;  Service: Cardiovascular;  Laterality: N/A;   ARTHROSCOPY KNEE W/ DRILLING Right ~ 2001   BYPASS GRAFT Left 12/05/2018   femoral popliteal    CARDIAC CATHETERIZATION  09/19/2018   CATARACT EXTRACTION     COLONOSCOPY     CORONARY ANGIOPLASTY WITH STENT PLACEMENT  1997   2   CORONARY PRESSURE/FFR STUDY  09/19/2018   CORONARY PRESSURE/FFR STUDY N/A 09/19/2018   Procedure: INTRAVASCULAR PRESSURE WIRE/FFR STUDY;  Surgeon: Court Dorn PARAS, MD;  Location: [facility] INVASIVE CV LAB;  Service: Cardiovascular;  Laterality: N/A;   FEMORAL-POPLITEAL BYPASS GRAFT Left 12/06/2018   Procedure: BYPASS GRAFT FEMORAL-POPLITEAL ARTERY;  Surgeon: Sheree Penne Bruckner, MD;  Location: [facility] OR;  Service: Vascular;  Laterality: Left;   ILIAC ARTERY STENT  04/22/2017   . Placement of a 6 mm  x 100 mm Viabahn covered stent left SFA   LEFT HEART CATH AND CORONARY ANGIOGRAPHY N/A 09/19/2018   Procedure: LEFT HEART CATH AND CORONARY ANGIOGRAPHY;  Surgeon: Court Dorn PARAS, MD;  Location: [facility] INVASIVE CV LAB;  Service: Cardiovascular;  Laterality: N/A;   LEFT HEART CATHETERIZATION WITH CORONARY ANGIOGRAM N/A 02/19/2012   Procedure: LEFT HEART CATHETERIZATION WITH CORONARY ANGIOGRAM;  Surgeon: Ezra GORMAN Shuck, MD;  Location: [facility] CATH LAB;  Service: Cardiovascular;  Laterality: N/A;   LOWER EXTREMITY ANGIOGRAPHY Bilateral 04/22/2017   Procedure: Lower Extremity Angiography;   Surgeon: Court Dorn PARAS, MD;  Location: [facility] INVASIVE CV LAB;  Service: Cardiovascular;  Laterality: Bilateral;   LOWER EXTREMITY ANGIOGRAPHY Bilateral 09/19/2018   LOWER EXTREMITY ANGIOGRAPHY Bilateral 09/19/2018   Procedure: LOWER EXTREMITY ANGIOGRAPHY;  Surgeon: Court Dorn PARAS, MD;  Location: [facility] INVASIVE CV LAB;  Service: Cardiovascular;  Laterality: Bilateral;   PERIPHERAL ARTERIAL STENT GRAFT  09/2010   LLE   PERIPHERAL VASCULAR ATHERECTOMY Left 04/22/2017   Procedure: Peripheral Vascular Atherectomy;  Surgeon: Court Dorn PARAS, MD;  Location: [facility] INVASIVE CV LAB;  Service: Cardiovascular;  Laterality: Left;  SFA   PERIPHERAL VASCULAR ATHERECTOMY  03/22/2023   Procedure: PERIPHERAL VASCULAR ATHERECTOMY;  Surgeon: Sheree Penne Bruckner, MD;  Location: [facility] INVASIVE CV LAB;  Service: Cardiovascular;;   PERIPHERAL VASCULAR INTERVENTION Left 04/22/2017   Procedure: Peripheral Vascular Intervention;  Surgeon: Court Dorn PARAS, MD;  Location: [facility] INVASIVE CV LAB;  Service: Cardiovascular;  Laterality: Left;  SFA   PERIPHERAL VASCULAR INTERVENTION  03/22/2023   Procedure: PERIPHERAL VASCULAR INTERVENTION;  Surgeon: Sheree Penne Bruckner, MD;  Location: [facility] INVASIVE CV LAB;  Service: Cardiovascular;;   UPPER GASTROINTESTINAL ENDOSCOPY      Social History   Socioeconomic History   Marital status: Married    Spouse name: Not on file   Number of children: 2   Years of education: 12   Highest education level: Not on file  Occupational History   Occupation: retired  Tobacco Use   Smoking status: Former    Current packs/day: 0.00    Average packs/day: 1 pack/day for 40.0 years (40.0 ttl pk-yrs)    Types: Cigarettes    Start date: 04/03/1970    Quit date: 04/03/2010    Years since quitting: 14.1    Passive exposure: Never   Smokeless tobacco: Never  Vaping Use   Vaping status: Never Used  Substance and Sexual Activity   Alcohol use: Not Currently    Comment: used to drink when I  was young; quit ~ 1980's   Drug use: Never   Sexual activity: Yes  Other Topics Concern   Not on file  Social History Narrative   Right handed   Two story home   4 children   Wife lives with him in the home   Caffeine on occasion   Social Drivers of Health   Financial Resource Strain: Low Risk  (09/17/2023)   Overall Financial Resource Strain (CARDIA)    Difficulty of Paying Living Expenses: Not hard at all  Food Insecurity: No Food Insecurity (09/17/2023)   Hunger Vital Sign    Worried About Running Out of Food in the Last Year: Never true    Ran Out of Food in the Last Year: Never true  Transportation Needs: No Transportation Needs (09/17/2023)   PRAPARE - Administrator, Civil Service (Medical): No    Lack of Transportation (Non-Medical): No  Physical Activity: Sufficiently Active (09/17/2023)   Exercise Vital Sign  Days of Exercise per Week: 7 days    Minutes of Exercise per Session: 60 min  Stress: No Stress Concern Present (09/17/2023)   [employer] of Occupational Health - Occupational Stress Questionnaire    Feeling of Stress : Not at all  Social Connections: Moderately Isolated (09/17/2023)   Social Connection and Isolation Panel    Frequency of Communication with Friends and Family: More than three times a week    Frequency of Social Gatherings with Friends and Family: Twice a week    Attends Religious Services: Never    Database administrator or Organizations: No    Attends Engineer, structural: Not on file    Marital Status: Married  Catering manager Violence: Not At Risk (09/17/2023)   Humiliation, Afraid, Rape, and Kick questionnaire    Fear of Current or Ex-Partner: No    Emotionally Abused: No    Physically Abused: No    Sexually Abused: No    Family History  Problem Relation Age of Onset   Ulcers Father        had stomach issues, not sure what happened   Coronary artery disease Brother        male 1st degree relative  <50   Colon cancer Neg Hx    Esophageal cancer Neg Hx    Rectal cancer Neg Hx    Stomach cancer Neg Hx     Current Outpatient Medications  Medication Sig Dispense Refill   acetaminophen  (TYLENOL ) 500 MG tablet Take 500 mg by mouth every 6 (six) hours as needed for mild pain (pain score 1-3) or moderate pain (pain score 4-6).     albuterol  (VENTOLIN  HFA) 108 (90 Base) MCG/ACT inhaler INHALE 2 PUFFS INTO THE LUNGS EVERY 4 HOURS AS NEEDED FOR WHEEZING OR SHORTNESS OF BREATH. 18 each 2   cefdinir  (OMNICEF ) 300 MG capsule Take 1 capsule (300 mg total) by mouth 2 (two) times daily. 20 capsule 0   cholecalciferol (VITAMIN D3) 25 MCG (1000 UNIT) tablet Take 1,000 Units by mouth in the morning.     cloNIDine  (CATAPRES ) 0.1 MG tablet Take 1 tablet (0.1 mg total) by mouth 3 (three) times daily as needed. 90 tablet 3   clopidogrel  (PLAVIX ) 75 MG tablet TAKE 1 TABLET BY MOUTH EVERY DAY 90 tablet 3   empagliflozin  (JARDIANCE ) 10 MG TABS tablet Take 1 tablet (10 mg total) by mouth daily before breakfast. 90 tablet 3   ezetimibe  (ZETIA ) 10 MG tablet Take 1 tablet (10 mg total) by mouth daily. 90 tablet 3   [name]  10 MG TABS TAKE 1 TABLET BY MOUTH EVERY DAY 30 tablet 5   memantine  (NAMENDA  XR) 14 MG CP24 24 hr capsule TAKE 1 CAPSULE BY MOUTH NIGHTLY 90 capsule 4   nitroGLYCERIN  (NITROSTAT ) 0.4 MG SL tablet Place 1 tablet under tongue every 5 mins. DO NOT USE MORE THAN 3 TABS 25 tablet 2   promethazine -dextromethorphan  (PROMETHAZINE -DM) 6.25-15 MG/5ML syrup Take 5 mLs by mouth 4 (four) times daily as needed for cough. 240 mL 0   rosuvastatin  (CRESTOR ) 20 MG tablet Take 1 tablet (20 mg total) by mouth daily. 90 tablet 3   sertraline  (ZOLOFT ) 25 MG tablet Take 1 tablet (25 mg total) by mouth daily. (Patient not taking: Reported on 04/27/2024) 30 tablet 11   tamsulosin  (FLOMAX ) 0.4 MG CAPS capsule Take 1 capsule (0.4 mg total) by mouth daily. 30 capsule 3   traZODone  (DESYREL ) 50 MG tablet TAKE 1/4 TO 1/2 TABLET  BY MOUTH AT NIGHT AS NEEDED FOR INSOMNIA 45 tablet 3   vitamin B-12 (CYANOCOBALAMIN ) 500 MCG tablet Take 500 mcg by mouth in the morning.     No current facility-administered medications for this visit.    Allergies  Allergen Reactions   Egg-Derived Products Itching    Boiled eggs    Aricept  [Donepezil ] Nausea And Vomiting   Coreg  [Carvedilol ] Other (See Comments)    Fatigue     REVIEW OF SYSTEMS:  [X]  denotes positive finding, [ ]  denotes negative finding Cardiac  Comments:  Chest pain or chest pressure:    Shortness of breath upon exertion:    Short of breath when lying flat:    Irregular heart rhythm:        Vascular    Pain in calf, thigh, or hip brought on by ambulation:    Pain in feet at night that wakes you up from your sleep:     Blood clot in your veins:    Leg swelling:  X       Pulmonary    Oxygen at home:    Productive cough:     Wheezing:         Neurologic    Sudden weakness in arms or legs:     Sudden numbness in arms or legs:     Sudden onset of difficulty speaking or slurred speech:    Temporary loss of vision in one eye:     Problems with dizziness:         Gastrointestinal    Blood in stool:     Vomited blood:         Genitourinary    Burning when urinating:     Blood in urine:        Psychiatric    Major depression:         Hematologic    Bleeding problems:    Problems with blood clotting too easily:        Skin    Rashes or ulcers:        Constitutional    Fever or chills:      PHYSICAL EXAMINATION:  Vitals:   05/10/24 1222  BP: (!) 165/69  Pulse: (!) 53  Temp: 97.9 F (36.6 C)  TempSrc: Temporal  SpO2: 96%  Weight: 155 lb 9.6 oz (70.6 kg)  Height: 5' 5 (1.651 m)    General:  WDWN in NAD; vital signs documented above Gait: Normal HENT: WNL, normocephalic Pulmonary: normal non-labored breathing Cardiac: regular HR Abdomen: soft Vascular Exam/Pulses: 2+ femoral, 2+ DP pulses bilaterally. Feet warm and well  perfused Extremities: without ischemic changes, without Gangrene , without cellulitis; without open wounds; edema of bilateral lower extremities Musculoskeletal: no muscle wasting or atrophy  Neurologic: A&O X 3 Psychiatric:  The pt has Normal affect.   Non-Invasive Vascular Imaging:   VAS US  Lower extremity bypass graft duplex: Summary:  Left: Patent stent within proximal left femoral-popliteal bypass graft.   Patent left femoral-popliteal bypass graft without evidence of hemodynamically significant stenosis.   +-------+-----------+-----------+------------+------------+  ABI/TBIToday's ABIToday's TBIPrevious ABIPrevious TBI  +-------+-----------+-----------+------------+------------+  Right 1.03       0.61       0.98        0.77          +-------+-----------+-----------+------------+------------+  Left  1.03       0.46       1.00        0.68          +-------+-----------+-----------+------------+------------+  Bilateral ABIs appear essentially unchanged compared to prior study on 11/10/23.     ASSESSMENT/PLAN:: 78 y.o. male here for surveillance follow up of PAD. He has history of left common femoral to above knee popliteal artery bypass with vein by Dr. Sheree on 12/06/18. He has had one intervention post bypass for recurrent stenosis with lazer atherectomy of the left femoral to AK popliteal bypass with stenting of the bypass in May of 2024. He has had some persistent left lower extremity edema since his initial surgery. He is without claudication, rest pain, tissue loss. - Continue statin, Plavix  - recommend follow up with primary care for evaluation of bilateral knee pain - Encourage walking regimen - Compression stocking and elevation to help with swelling PRN - He will follow up in 1 year with ABI and LLE bypass graft duplex   Teretha Damme, PA-C [facility] [phone]  Clinic MD:   Sheree

## 2024-05-12 DIAGNOSIS — N1832 Chronic kidney disease, stage 3b: Secondary | ICD-10-CM | POA: Diagnosis not present

## 2024-05-12 DIAGNOSIS — I129 Hypertensive chronic kidney disease with stage 1 through stage 4 chronic kidney disease, or unspecified chronic kidney disease: Secondary | ICD-10-CM | POA: Diagnosis not present

## 2024-06-01 ENCOUNTER — Ambulatory Visit: Payer: PPO | Admitting: Physician Assistant

## 2024-06-08 ENCOUNTER — Ambulatory Visit (HOSPITAL_BASED_OUTPATIENT_CLINIC_OR_DEPARTMENT_OTHER): Admitting: Orthopaedic Surgery

## 2024-06-08 ENCOUNTER — Ambulatory Visit (HOSPITAL_BASED_OUTPATIENT_CLINIC_OR_DEPARTMENT_OTHER)

## 2024-06-08 DIAGNOSIS — R809 Proteinuria, unspecified: Secondary | ICD-10-CM | POA: Diagnosis not present

## 2024-06-08 DIAGNOSIS — N1832 Chronic kidney disease, stage 3b: Secondary | ICD-10-CM | POA: Diagnosis not present

## 2024-06-08 DIAGNOSIS — M25562 Pain in left knee: Secondary | ICD-10-CM | POA: Diagnosis not present

## 2024-06-08 DIAGNOSIS — M25552 Pain in left hip: Secondary | ICD-10-CM

## 2024-06-08 DIAGNOSIS — M160 Bilateral primary osteoarthritis of hip: Secondary | ICD-10-CM | POA: Diagnosis not present

## 2024-06-08 DIAGNOSIS — I129 Hypertensive chronic kidney disease with stage 1 through stage 4 chronic kidney disease, or unspecified chronic kidney disease: Secondary | ICD-10-CM | POA: Diagnosis not present

## 2024-06-08 DIAGNOSIS — E1122 Type 2 diabetes mellitus with diabetic chronic kidney disease: Secondary | ICD-10-CM | POA: Diagnosis not present

## 2024-06-08 DIAGNOSIS — E875 Hyperkalemia: Secondary | ICD-10-CM | POA: Diagnosis not present

## 2024-06-08 MED ORDER — TRIAMCINOLONE ACETONIDE 40 MG/ML IJ SUSP
80.0000 mg | INTRAMUSCULAR | Status: AC | PRN
  Administered 2024-06-08: 80 mg via INTRA_ARTICULAR

## 2024-06-08 MED ORDER — LIDOCAINE HCL 1 % IJ SOLN
4.0000 mL | INTRAMUSCULAR | Status: AC | PRN
  Administered 2024-06-08: 4 mL

## 2024-06-08 NOTE — Progress Notes (Signed)
 Chief Complaint: Left hip pain     History of Present Illness:   06/08/2024: Presents today for follow-up of the left hip as well as left knee.  Both of these have been persistently painful with activity.  Harold Waters is a 78 y.o. male presents today with ongoing left hip pain that he experiences in the central portion of the buttocks.  He is currently scheduled to have an abdominal aortogram and several weeks.  He is experiencing pain and limited motion particularly when he is on his feet for longer period of time.  He is here today with his daughter.    Surgical History:   None  PMH/PSH/Family History/Social History/Meds/Allergies:    Past Medical History:  Diagnosis Date  . Angina   . Chronic stomach ulcer    get them off and on (09/19/2018)  . Coronary atherosclerosis of unspecified type of vessel, native or graft   . Diverticulosis   . DVT of lower extremity (deep venous thrombosis) (HCC) ~ 2010   LLE  . Erosive gastritis   . Essential hypertension, benign   . GERD (gastroesophageal reflux disease)   . Headache(784.0) 02/18/12   lately  . History of kidney stones   . Internal hemorrhoids   . Lower back pain   . Myocardial infarction (HCC) 1997  . Osteoarthritis   . Other and unspecified hyperlipidemia   . PAD (peripheral artery disease) (HCC)    with ABI's 0.8 on the right and 0.86 on the left  . Pneumonia 1957  . PSVT (paroxysmal supraventricular tachycardia) (HCC) 02/18/12  . Shortness of breath    lying down  . Sleep apnea    does not wear c-pap; pt does not recall this hx on 09/19/2018   Past Surgical History:  Procedure Laterality Date  . ABDOMINAL AORTOGRAM W/LOWER EXTREMITY N/A 03/22/2023   Procedure: ABDOMINAL AORTOGRAM W/LOWER EXTREMITY;  Surgeon: Sheree Penne Bruckner, MD;  Location: [facility] INVASIVE CV LAB;  Service: Cardiovascular;  Laterality: N/A;  . ARTHROSCOPY KNEE W/ DRILLING Right ~ 2001  . BYPASS  GRAFT Left 12/05/2018   femoral popliteal   . CARDIAC CATHETERIZATION  09/19/2018  . CATARACT EXTRACTION    . COLONOSCOPY    . CORONARY ANGIOPLASTY WITH STENT PLACEMENT  1997   2  . CORONARY PRESSURE/FFR STUDY  09/19/2018  . CORONARY PRESSURE/FFR STUDY N/A 09/19/2018   Procedure: INTRAVASCULAR PRESSURE WIRE/FFR STUDY;  Surgeon: Court Dorn PARAS, MD;  Location: [facility] INVASIVE CV LAB;  Service: Cardiovascular;  Laterality: N/A;  . FEMORAL-POPLITEAL BYPASS GRAFT Left 12/06/2018   Procedure: BYPASS GRAFT FEMORAL-POPLITEAL ARTERY;  Surgeon: Sheree Penne Bruckner, MD;  Location: [facility] OR;  Service: Vascular;  Laterality: Left;  . ILIAC ARTERY STENT  04/22/2017   . Placement of a 6 mm x 100 mm Viabahn covered stent left SFA  . LEFT HEART CATH AND CORONARY ANGIOGRAPHY N/A 09/19/2018   Procedure: LEFT HEART CATH AND CORONARY ANGIOGRAPHY;  Surgeon: Court Dorn PARAS, MD;  Location: [facility] INVASIVE CV LAB;  Service: Cardiovascular;  Laterality: N/A;  . LEFT HEART CATHETERIZATION WITH CORONARY ANGIOGRAM N/A 02/19/2012   Procedure: LEFT HEART CATHETERIZATION WITH CORONARY ANGIOGRAM;  Surgeon: Ezra GORMAN Shuck, MD;  Location: [facility] CATH LAB;  Service: Cardiovascular;  Laterality: N/A;  . LOWER EXTREMITY ANGIOGRAPHY Bilateral 04/22/2017   Procedure: Lower Extremity Angiography;  Surgeon: Court Dorn PARAS, MD;  Location: [facility] INVASIVE CV LAB;  Service: Cardiovascular;  Laterality: Bilateral;  . LOWER EXTREMITY ANGIOGRAPHY Bilateral 09/19/2018  . LOWER EXTREMITY ANGIOGRAPHY Bilateral 09/19/2018   Procedure: LOWER EXTREMITY ANGIOGRAPHY;  Surgeon: Court Dorn PARAS, MD;  Location: [facility] INVASIVE CV LAB;  Service: Cardiovascular;  Laterality: Bilateral;  . PERIPHERAL ARTERIAL STENT GRAFT  09/2010   LLE  . PERIPHERAL VASCULAR ATHERECTOMY Left 04/22/2017   Procedure: Peripheral Vascular Atherectomy;  Surgeon: Court Dorn PARAS, MD;  Location: [facility] INVASIVE CV LAB;  Service: Cardiovascular;  Laterality: Left;  SFA  .  PERIPHERAL VASCULAR ATHERECTOMY  03/22/2023   Procedure: PERIPHERAL VASCULAR ATHERECTOMY;  Surgeon: Sheree Penne Bruckner, MD;  Location: [facility] INVASIVE CV LAB;  Service: Cardiovascular;;  . PERIPHERAL VASCULAR INTERVENTION Left 04/22/2017   Procedure: Peripheral Vascular Intervention;  Surgeon: Court Dorn PARAS, MD;  Location: [facility] INVASIVE CV LAB;  Service: Cardiovascular;  Laterality: Left;  SFA  . PERIPHERAL VASCULAR INTERVENTION  03/22/2023   Procedure: PERIPHERAL VASCULAR INTERVENTION;  Surgeon: Sheree Penne Bruckner, MD;  Location: [facility] INVASIVE CV LAB;  Service: Cardiovascular;;  . UPPER GASTROINTESTINAL ENDOSCOPY     Social History   Socioeconomic History  . Marital status: Married    Spouse name: Not on file  . Number of children: 2  . Years of education: 74  . Highest education level: Not on file  Occupational History  . Occupation: retired  Tobacco Use  . Smoking status: Former    Current packs/day: 0.00    Average packs/day: 1 pack/day for 40.0 years (40.0 ttl pk-yrs)    Types: Cigarettes    Start date: 04/03/1970    Quit date: 04/03/2010    Years since quitting: 14.1    Passive exposure: Never  . Smokeless tobacco: Never  Vaping Use  . Vaping status: Never Used  Substance and Sexual Activity  . Alcohol use: Not Currently    Comment: used to drink when I was young; quit ~ 1980's  . Drug use: Never  . Sexual activity: Yes  Other Topics Concern  . Not on file  Social History Narrative   Right handed   Two story home   4 children   Wife lives with him in the home   Caffeine on occasion   Social Drivers of Health   Financial Resource Strain: Low Risk  (09/17/2023)   Overall Financial Resource Strain (CARDIA)   . Difficulty of Paying Living Expenses: Not hard at all  Food Insecurity: No Food Insecurity (09/17/2023)   Hunger Vital Sign   . Worried About Programme researcher, broadcasting/film/video in the Last Year: Never true   . Ran Out of Food in the Last Year: Never true   Transportation Needs: No Transportation Needs (09/17/2023)   PRAPARE - Transportation   . Lack of Transportation (Medical): No   . Lack of Transportation (Non-Medical): No  Physical Activity: Sufficiently Active (09/17/2023)   Exercise Vital Sign   . Days of Exercise per Week: 7 days   . Minutes of Exercise per Session: 60 min  Stress: No Stress Concern Present (09/17/2023)   [employer] of Occupational Health - Occupational Stress Questionnaire   . Feeling of Stress : Not at all  Social Connections: Moderately Isolated (09/17/2023)   Social Connection and Isolation Panel   . Frequency of Communication with Friends and Family: More than three times a week   . Frequency of Social Gatherings with Friends and Family: Twice a week   . Attends Religious  Services: Never   . Active Member of Clubs or Organizations: No   . Attends Banker Meetings: Not on file   . Marital Status: Married   Family History  Problem Relation Age of Onset  . Ulcers Father        had stomach issues, not sure what happened  . Coronary artery disease Brother        male 1st degree relative <50  . Colon cancer Neg Hx   . Esophageal cancer Neg Hx   . Rectal cancer Neg Hx   . Stomach cancer Neg Hx    Allergies  Allergen Reactions  . Egg-Derived Products Itching    Boiled eggs   . Aricept  [Donepezil ] Nausea And Vomiting  . Coreg  [Carvedilol ] Other (See Comments)    Fatigue   Current Outpatient Medications  Medication Sig Dispense Refill  . acetaminophen  (TYLENOL ) 500 MG tablet Take 500 mg by mouth every 6 (six) hours as needed for mild pain (pain score 1-3) or moderate pain (pain score 4-6).    . albuterol  (VENTOLIN  HFA) 108 (90 Base) MCG/ACT inhaler INHALE 2 PUFFS INTO THE LUNGS EVERY 4 HOURS AS NEEDED FOR WHEEZING OR SHORTNESS OF BREATH. 18 each 2  . cefdinir  (OMNICEF ) 300 MG capsule Take 1 capsule (300 mg total) by mouth 2 (two) times daily. 20 capsule 0  . cholecalciferol (VITAMIN  D3) 25 MCG (1000 UNIT) tablet Take 1,000 Units by mouth in the morning.    . cloNIDine  (CATAPRES ) 0.1 MG tablet Take 1 tablet (0.1 mg total) by mouth 3 (three) times daily as needed. 90 tablet 3  . clopidogrel  (PLAVIX ) 75 MG tablet TAKE 1 TABLET BY MOUTH EVERY DAY 90 tablet 3  . empagliflozin  (JARDIANCE ) 10 MG TABS tablet Take 1 tablet (10 mg total) by mouth daily before breakfast. 90 tablet 3  . ezetimibe  (ZETIA ) 10 MG tablet Take 1 tablet (10 mg total) by mouth daily. 90 tablet 3  . [name]  10 MG TABS TAKE 1 TABLET BY MOUTH EVERY DAY 30 tablet 5  . memantine  (NAMENDA  XR) 14 MG CP24 24 hr capsule TAKE 1 CAPSULE BY MOUTH NIGHTLY 90 capsule 4  . nitroGLYCERIN  (NITROSTAT ) 0.4 MG SL tablet Place 1 tablet under tongue every 5 mins. DO NOT USE MORE THAN 3 TABS 25 tablet 2  . promethazine -dextromethorphan  (PROMETHAZINE -DM) 6.25-15 MG/5ML syrup Take 5 mLs by mouth 4 (four) times daily as needed for cough. 240 mL 0  . rosuvastatin  (CRESTOR ) 20 MG tablet Take 1 tablet (20 mg total) by mouth daily. 90 tablet 3  . sertraline  (ZOLOFT ) 25 MG tablet Take 1 tablet (25 mg total) by mouth daily. (Patient not taking: Reported on 04/27/2024) 30 tablet 11  . tamsulosin  (FLOMAX ) 0.4 MG CAPS capsule Take 1 capsule (0.4 mg total) by mouth daily. 30 capsule 3  . traZODone  (DESYREL ) 50 MG tablet TAKE 1/4 TO 1/2 TABLET BY MOUTH AT NIGHT AS NEEDED FOR INSOMNIA 45 tablet 3  . vitamin B-12 (CYANOCOBALAMIN ) 500 MCG tablet Take 500 mcg by mouth in the morning.     No current facility-administered medications for this visit.   No results found.  Review of Systems:   A ROS was performed including pertinent positives and negatives as documented in the HPI.  Physical Exam :   Constitutional: NAD and appears stated age Neurological: Alert and oriented Psych: Appropriate affect and cooperative There were no vitals taken for this visit.   Comprehensive Musculoskeletal Exam:    Left hip with internal  rotation of 30 degrees  external rotation 40 degrees.  No significant pain with FADIR maneuver.  No crepitus appreciated  Left knee with tenderness about the medial lateral joint lines with crepitus.  Imaging:   Xray (3 views left hip): Cam impingement with mild osteoarthritis of the left hip   I personally reviewed and interpreted the radiographs.   Assessment:   78 y.o. male with mild osteoarthritis of the left hip as well as left knee.  At today's visit he is requesting injections of both the left hip and the left knee.  This was provided after verbal consent was obtained Plan :    -  Procedure Note  Patient: Harold Waters             Date of Birth: [birth year]           MRN: [id number]             Visit Date: 06/08/2024  Procedures: Visit Diagnoses:  1. Pain in left hip     Large Joint Inj: L knee on 06/08/2024 12:42 PM Indications: pain Details: 22 G 1.5 in needle, ultrasound-guided anterior approach  Arthrogram: No  Medications: 4 mL lidocaine  1 %; 80 mg triamcinolone  acetonide 40 MG/ML Outcome: tolerated well, no immediate complications Procedure, treatment alternatives, risks and benefits explained, specific risks discussed. Consent was given by the patient. Immediately prior to procedure a time out was called to verify the correct patient, procedure, equipment, support staff and site/side marked as required. Patient was prepped and draped in the usual sterile fashion.    Large Joint Inj: L hip joint on 06/08/2024 12:42 PM Indications: pain Details: 22 G 3.5 in needle, ultrasound-guided anterolateral approach  Arthrogram: No  Medications: 4 mL lidocaine  1 %; 80 mg triamcinolone  acetonide 40 MG/ML Outcome: tolerated well, no immediate complications Procedure, treatment alternatives, risks and benefits explained, specific risks discussed. Consent was given by the patient. Immediately prior to procedure a time out was called to verify the correct patient, procedure, equipment, support  staff and site/side marked as required. Patient was prepped and draped in the usual sterile fashion.           I personally saw and evaluated the patient, and participated in the management and treatment plan.  Elspeth Parker, MD Attending Physician, Orthopedic Surgery  This document was dictated using Dragon voice recognition software. A reasonable attempt at proof reading has been made to minimize errors.

## 2024-06-09 ENCOUNTER — Other Ambulatory Visit: Payer: Self-pay | Admitting: Physician Assistant

## 2024-06-14 ENCOUNTER — Ambulatory Visit (INDEPENDENT_AMBULATORY_CARE_PROVIDER_SITE_OTHER): Payer: PPO | Admitting: Psychology

## 2024-06-14 ENCOUNTER — Ambulatory Visit: Payer: Self-pay

## 2024-06-14 ENCOUNTER — Encounter: Payer: Self-pay | Admitting: Psychology

## 2024-06-14 ENCOUNTER — Telehealth: Payer: Self-pay

## 2024-06-14 DIAGNOSIS — I639 Cerebral infarction, unspecified: Secondary | ICD-10-CM | POA: Insufficient documentation

## 2024-06-14 DIAGNOSIS — F0280 Dementia in other diseases classified elsewhere without behavioral disturbance: Secondary | ICD-10-CM | POA: Diagnosis not present

## 2024-06-14 DIAGNOSIS — I679 Cerebrovascular disease, unspecified: Secondary | ICD-10-CM | POA: Insufficient documentation

## 2024-06-14 DIAGNOSIS — G4733 Obstructive sleep apnea (adult) (pediatric): Secondary | ICD-10-CM | POA: Diagnosis not present

## 2024-06-14 DIAGNOSIS — R4189 Other symptoms and signs involving cognitive functions and awareness: Secondary | ICD-10-CM

## 2024-06-14 NOTE — Progress Notes (Signed)
 NEUROPSYCHOLOGICAL EVALUATION [place] Department of Neurology  Date of Evaluation: June 14, 2024  Reason for Referral:   Harold Waters is a 78 y.o. right-handed Hispanic male referred by Camie Sevin, PA-C, to characterize his current cognitive functioning and assist with diagnostic clarity and treatment planning in the context of concerns for vascular-related cognitive impairment.   Assessment and Plan:   Clinical Impression(s): Harold Waters' pattern of performance is suggestive of fairly diffuse cognitive impairment. Performances were appropriate relative to age-matched peers across basic attention, receptive language, and a task assessing safety and judgment. However, significant impairment was exhibited across complex attention, executive functioning, verbal fluency, confrontation naming, and encoding (i.e., learning) aspects of memory. Further weakness/variability was exhibited across processing speed and both delayed retrieval and recognition/consolidation aspects of memory. Functionally, he remains largely independent with his wife reporting concern surrounding diminished navigational abilities while driving. Diagnostically, he remains on the border between mild and major neurocognitive disorder designations. Given the degree of impairment, he perhaps best meets criteria for a Major Neurocognitive Disorder (dementia) presentation. However, given functional reporting, he would be towards the mild end of this spectrum presently.   The cause for his mild dementia presentation remains uncertain. Historically, there have been concerns for a vascular dementia presentation. This is primarily based upon neuroimaging suggesting moderate microvascular ischemic disease, as well as several strokes spanning numerous brain regions (i.e., left occipital lobe, bilateral thalami, and bilateral cerebellar hemispheres). Given vascular disease and the diffuse nature of  stroke activity, diffuse cognitive impairment is a reasonable expectation. His areas of greatest weakness (e.g., complex attention, executive functioning, encoding aspects of memory) are also very common in vascular etiologies. A primary vascular cause (i.e., vascular dementia) remains plausible.  A mixed dementia presentation (i.e., vascular disease with co-occurring Alzheimer's disease) cannot be ruled out. Retention rates across memory testing ranged from 33% to 47%, suggesting some concern for potential rapid forgetting. Deficits in semantic fluency and confrontation naming would align with Alzheimer's disease concerns as well. However, it is very important to point out the strong potential for expressive language performances to be artificially weakened due to Harold Waters being tested in [place] rather than his native language (Spanish). Harold Waters did somewhat benefit from cueing across memory testing. If there is a component of this illness, it would be in earlier stages at the present time. Continued medical monitoring will be important moving forward.   Recommendations: If Harold Waters or his family wished for a more biologically based answer to questions surrounding Alzheimer's disease, biomarkers could be pursued via a lumbar puncture or blood plasma testing. They could discuss this with [name].   Untreated sleep apnea will worsen cerebrovascular health and will increase his risk for heart attack, stroke, and worsening dementia. He is encouraged to adhere to treatment recommendations surrounding this condition.   Harold Waters has already been prescribed a medication aimed to address memory loss and concerns surrounding progressive decline (i.e., memantine /Namenda ). He is encouraged to continue taking this medication as prescribed. It is important to highlight that this medication has been shown to slow functional decline in some individuals. There is no current treatment which can stop or reverse  cognitive decline when caused by a neurodegenerative illness.   Performance across neurocognitive testing is not a strong predictor of an individual's safety operating a motor vehicle. Should his family wish to pursue a formalized driving evaluation, they could reach out to the following agencies: [employer] in  [place]: [phone] [facility]: [phone] [facility]: [phone] [facility]: [phone] or [phone]  Should there be progression of current deficits over time, Harold Waters is unlikely to regain any independent living skills lost. Therefore, it is recommended that he remain as involved as possible in all aspects of household chores, finances, and medication management, with supervision to ensure adequate performance. He will likely benefit from the establishment and maintenance of a routine in order to maximize his functional abilities over time.  It will be important for Harold Waters to have another person with him when in situations where he may need to process information, weigh the pros and cons of different options, and make decisions, in order to ensure that he fully understands and recalls all information to be considered.  If not already done, Harold Waters and his family may want to discuss his wishes regarding durable power of attorney and medical decision making, so that he can have input into these choices. If they require legal assistance with this, long-term care resource access, or other aspects of estate planning, they could reach out to [employer] at [phone] for a free consultation. Additionally, they may wish to discuss future plans for caretaking and seek out community options for in home/residential care should they become necessary.  Harold Waters is encouraged to attend to lifestyle factors for brain health (e.g., regular physical exercise, good nutrition habits and consideration of the MIND-DASH diet,  regular participation in cognitively-stimulating activities, and general stress management techniques), which are likely to have benefits for both emotional adjustment and cognition. In fact, in addition to promoting good general health, regular exercise incorporating aerobic activities (e.g., brisk walking, jogging, cycling, etc.) has been demonstrated to be a very effective treatment for depression and stress, with similar efficacy rates to both antidepressant medication and psychotherapy. Optimal control of vascular risk factors (including safe cardiovascular exercise and adherence to dietary recommendations) is encouraged. Continued participation in activities which provide mental stimulation and social interaction is also recommended.   Important information should be provided to Mr. Montrose in written format in all instances. This information should be placed in a highly frequented and easily visible location within his home to promote recall. External strategies such as written notes in a consistently used memory journal, visual and nonverbal auditory cues such as a calendar on the refrigerator or appointments with alarm, such as on a cell phone, can also help maximize recall.  To address problems with processing speed, he may wish to consider:   -Ensuring that he is alerted when essential material or instructions are being presented   -Adjusting the speed at which new information is presented   -Allowing for more time in comprehending, processing, and responding in conversation   -Repeating and paraphrasing instructions or conversations aloud  To address problems with fluctuating attention and/or executive dysfunction, he may wish to consider:   -Avoiding external distractions when needing to concentrate   -Limiting exposure to fast paced environments with multiple sensory demands   -Writing down complicated information and using checklists   -Attempting and completing one task at a time (i.e.,  no multi-tasking)   -Verbalizing aloud each step of a task to maintain focus   -Taking frequent breaks during the completion of steps/tasks to avoid fatigue   -Reducing the amount of information considered at one time   -Scheduling more difficult activities for a time of day where he is usually most alert  Review of Records:   Past Medical History:  Diagnosis Date   Actinic keratosis 09/06/2012   R forehead     AKI (acute kidney injury) 03/18/2024   Angina    Arthralgia 02/27/2010   12/12 due to fever  11/20 ?Crestor  related  2025  Chronic hand OA B     Atherosclerosis of native artery of extremity with intermittent claudication 06/02/2010      Crestor    Plavix      Bladder neck obstruction 03/30/2024   Flomax  Rx - use prn     CAD (coronary artery disease) 10/03/2012   RCA, OM3, 50-60% LAD Nov 2019- medical Rx  Normal LVF  Plavix , Coreg  - pt stopped, Isosorbide , Crestor   NTG prn     CAP (community acquired pneumonia) 10/12/2011   12/12  1/23  Better (was seen at [facility], had a CXR). Treated  03/2024     Cerebrovascular disease    moderate per MRI   Chronic stomach ulcer    get them off and on (09/19/2018)   CKD (chronic kidney disease) stage 4, GFR 15-29 ml/min 12/08/2023   [name]  - trying to approve the Rx  Check UACR  [name]  was $800     Constipation 01/29/2021   3/22 new x 3 wks. Self - limiting diverticulitis vs other     Coronary atherosclerosis of unspecified type of vessel, native or graft    Deficiency of other specified B group vitamins 12/12/2018   Diabetes mellitus type 2 with complications 06/22/2016   Metformin  - not taking  2/20 Cont w/Metformin  500 mg bid --d/c  1/23 On diet  CBG120-160     Distorted vision 08/23/2020   08/23/20 B ?retinal detachment vs other. Resolved     Diverticulosis    Diverticulosis of intestine, part unspecified, without perforation or abscess without bleeding 12/12/2018   DVT of lower extremity (deep venous thrombosis) (HCC) ~ 2010    LLE   Erosive gastritis    Essential hypertension 09/07/2007   Chronic   Losartan d/c, Amlodipine  d/c  Coreg  stopped  10/21 Clonidine  - BP is better w/Clonidine   He lost wt, eating better, less salt  11/22 Start Clonidine  patch  Cont Norvasc , Clonidine  patch  Refractory  Increase Hydrazine to 50 mg three times a day if tolerated  He had a PET CT on 10/07/21 w/[facility] - starting XRT seeds  Nephrology ref was made  1/23 SBP150-180  Increase Hydralazine  to    GERD (gastroesophageal reflux disease) 02/20/2008   Worse in 10/13, 4/16  Better on Nexium   Dr Aneita        Headache(784.0) 02/18/2012   lately   History of kidney stones    Hyperlipidemia associated with type 2 diabetes mellitus 09/07/2007   Chronic   On Crestor      Hypotension 03/18/2024   Insomnia 07/03/2019   Chronic  2020  Try Trazodone  - low dose     Internal hemorrhoids    Knee pain, bilateral 05/22/2020   L>R   B bursa anserina bursitis and OA  Much better after Medrol  pack     Low back pain 02/27/2010   Lower back pain    Malignant neoplasm of prostate 10/10/2021   Monitor PSA.  Follow-up with urology.  Status post radiation therapy with Dr. Patrcia     Migraine headache with aura 01/27/2013   Multifocal pneumonia 03/17/2024   Muscle weakness (generalized) 12/12/2018   Myocardial infarction 1997   Neoplasm of uncertain behavior of skin 10/18/2007   Moles on face  Skin ulcer w/pigment L cheek -  Derm ref     Nonrheumatic aortic valve insufficiency 03/05/2023   Obstructive sleep apnea    cannot tolerate CPAP   Osteoarthritis    PAD (peripheral artery disease) 10/18/2018   Plavix , Crestor   S/p left femoral to above-the-knee popliteal artery bypass with vein by Dr. Sheree on 12/06/2018 due to lifestyle limiting left leg claudication.     Pain in joint, shoulder region 06/22/2016   Chronic R shoulder pain  H/o dislocation  ROM exercises  Tramadol  prn   Potential benefits of a long term opioids use as well as potential  risks (i.e. addiction risk, apnea etc) and complications (i.e. Somnolence, constipation and others) were explained to the patient and were aknowledged.     Pneumonia 1957   Postsurgical percutaneous transluminal coronary angioplasty status 09/07/2007   PSVT (paroxysmal supraventricular tachycardia) (HCC) 02/18/2012   SOB (shortness of breath) 04/03/2009   Stroke    Chronic lacunar infarcts within the bilateral thalami and cerebellar hemispheres; small chronic left occipital lobe cortical infarct   URI (upper respiratory infection) 10/15/2022   10/2022  Z pac  Hycodan prn     Urinary frequency 08/23/2020   Venous (peripheral) insufficiency 12/19/2008   Vitamin D  deficiency 12/12/2018    Past Surgical History:  Procedure Laterality Date   ABDOMINAL AORTOGRAM W/LOWER EXTREMITY N/A 03/22/2023   Procedure: ABDOMINAL AORTOGRAM W/LOWER EXTREMITY;  Surgeon: Sheree Penne Bruckner, MD;  Location: [facility] INVASIVE CV LAB;  Service: Cardiovascular;  Laterality: N/A;   ARTHROSCOPY KNEE W/ DRILLING Right ~ 2001   BYPASS GRAFT Left 12/05/2018   femoral popliteal    CARDIAC CATHETERIZATION  09/19/2018   CATARACT EXTRACTION     COLONOSCOPY     CORONARY ANGIOPLASTY WITH STENT PLACEMENT  1997   2   CORONARY PRESSURE/FFR STUDY  09/19/2018   CORONARY PRESSURE/FFR STUDY N/A 09/19/2018   Procedure: INTRAVASCULAR PRESSURE WIRE/FFR STUDY;  Surgeon: Court Dorn PARAS, MD;  Location: [facility] INVASIVE CV LAB;  Service: Cardiovascular;  Laterality: N/A;   FEMORAL-POPLITEAL BYPASS GRAFT Left 12/06/2018   Procedure: BYPASS GRAFT FEMORAL-POPLITEAL ARTERY;  Surgeon: Sheree Penne Bruckner, MD;  Location: [facility] OR;  Service: Vascular;  Laterality: Left;   ILIAC ARTERY STENT  04/22/2017   . Placement of a 6 mm x 100 mm Viabahn covered stent left SFA   LEFT HEART CATH AND CORONARY ANGIOGRAPHY N/A 09/19/2018   Procedure: LEFT HEART CATH AND CORONARY ANGIOGRAPHY;  Surgeon: Court Dorn PARAS, MD;  Location: [facility] INVASIVE CV LAB;   Service: Cardiovascular;  Laterality: N/A;   LEFT HEART CATHETERIZATION WITH CORONARY ANGIOGRAM N/A 02/19/2012   Procedure: LEFT HEART CATHETERIZATION WITH CORONARY ANGIOGRAM;  Surgeon: Ezra GORMAN Shuck, MD;  Location: [facility] CATH LAB;  Service: Cardiovascular;  Laterality: N/A;   LOWER EXTREMITY ANGIOGRAPHY Bilateral 04/22/2017   Procedure: Lower Extremity Angiography;  Surgeon: Court Dorn PARAS, MD;  Location: [facility] INVASIVE CV LAB;  Service: Cardiovascular;  Laterality: Bilateral;   LOWER EXTREMITY ANGIOGRAPHY Bilateral 09/19/2018   LOWER EXTREMITY ANGIOGRAPHY Bilateral 09/19/2018   Procedure: LOWER EXTREMITY ANGIOGRAPHY;  Surgeon: Court Dorn PARAS, MD;  Location: [facility] INVASIVE CV LAB;  Service: Cardiovascular;  Laterality: Bilateral;   PERIPHERAL ARTERIAL STENT GRAFT  09/2010   LLE   PERIPHERAL VASCULAR ATHERECTOMY Left 04/22/2017   Procedure: Peripheral Vascular Atherectomy;  Surgeon: Court Dorn PARAS, MD;  Location: [facility] INVASIVE CV LAB;  Service: Cardiovascular;  Laterality: Left;  SFA   PERIPHERAL VASCULAR ATHERECTOMY  03/22/2023   Procedure: PERIPHERAL VASCULAR ATHERECTOMY;  Surgeon: Sheree Penne Bruckner, MD;  Location: [facility] INVASIVE CV LAB;  Service: Cardiovascular;;   PERIPHERAL VASCULAR INTERVENTION Left 04/22/2017   Procedure: Peripheral Vascular Intervention;  Surgeon: Court Dorn PARAS, MD;  Location: [facility] INVASIVE CV LAB;  Service: Cardiovascular;  Laterality: Left;  SFA   PERIPHERAL VASCULAR INTERVENTION  03/22/2023   Procedure: PERIPHERAL VASCULAR INTERVENTION;  Surgeon: Sheree Penne Bruckner, MD;  Location: [facility] INVASIVE CV LAB;  Service: Cardiovascular;;   UPPER GASTROINTESTINAL ENDOSCOPY      Current Outpatient Medications:    acetaminophen  (TYLENOL ) 500 MG tablet, Take 500 mg by mouth every 6 (six) hours as needed for mild pain (pain score 1-3) or moderate pain (pain score 4-6)., Disp: , Rfl:    albuterol  (VENTOLIN  HFA) 108 (90 Base) MCG/ACT inhaler, INHALE 2 PUFFS INTO THE LUNGS  EVERY 4 HOURS AS NEEDED FOR WHEEZING OR SHORTNESS OF BREATH., Disp: 18 each, Rfl: 2   cefdinir  (OMNICEF ) 300 MG capsule, Take 1 capsule (300 mg total) by mouth 2 (two) times daily., Disp: 20 capsule, Rfl: 0   cholecalciferol (VITAMIN D3) 25 MCG (1000 UNIT) tablet, Take 1,000 Units by mouth in the morning., Disp: , Rfl:    cloNIDine  (CATAPRES ) 0.1 MG tablet, Take 1 tablet (0.1 mg total) by mouth 3 (three) times daily as needed., Disp: 90 tablet, Rfl: 3   clopidogrel  (PLAVIX ) 75 MG tablet, TAKE 1 TABLET BY MOUTH EVERY DAY, Disp: 90 tablet, Rfl: 3   empagliflozin  (JARDIANCE ) 10 MG TABS tablet, Take 1 tablet (10 mg total) by mouth daily before breakfast., Disp: 90 tablet, Rfl: 3   ezetimibe  (ZETIA ) 10 MG tablet, Take 1 tablet (10 mg total) by mouth daily., Disp: 90 tablet, Rfl: 3   [name]  10 MG TABS, TAKE 1 TABLET BY MOUTH EVERY DAY, Disp: 30 tablet, Rfl: 5   memantine  (NAMENDA  XR) 14 MG CP24 24 hr capsule, TAKE 1 CAPSULE BY MOUTH NIGHTLY, Disp: 90 capsule, Rfl: 4   nitroGLYCERIN  (NITROSTAT ) 0.4 MG SL tablet, Place 1 tablet under tongue every 5 mins. DO NOT USE MORE THAN 3 TABS, Disp: 25 tablet, Rfl: 2   promethazine -dextromethorphan  (PROMETHAZINE -DM) 6.25-15 MG/5ML syrup, Take 5 mLs by mouth 4 (four) times daily as needed for cough., Disp: 240 mL, Rfl: 0   rosuvastatin  (CRESTOR ) 20 MG tablet, Take 1 tablet (20 mg total) by mouth daily., Disp: 90 tablet, Rfl: 3   sertraline  (ZOLOFT ) 25 MG tablet, Take 1 tablet (25 mg total) by mouth daily. (Patient not taking: Reported on 04/27/2024), Disp: 30 tablet, Rfl: 11   tamsulosin  (FLOMAX ) 0.4 MG CAPS capsule, Take 1 capsule (0.4 mg total) by mouth daily., Disp: 30 capsule, Rfl: 3   traZODone  (DESYREL ) 50 MG tablet, TAKE 1/4 TO 1/2 TABLET BY MOUTH AT NIGHT AS NEEDED FOR INSOMNIA, Disp: 45 tablet, Rfl: 3   vitamin B-12 (CYANOCOBALAMIN ) 500 MCG tablet, Take 500 mcg by mouth in the morning., Disp: , Rfl:      04/17/2024   12:00 PM 12/01/2023    3:00 PM 07/19/2023    11:00 AM 05/12/2023   12:00 PM 11/06/2022    6:00 PM  MMSE - Mini Mental State Exam  Orientation to time 5 5 5 5 5   Orientation to Place 5 5 4 5 5   Registration 3 3 3 3 3   Attention/ Calculation 5 4 0 5 5  Recall 3 3 3 3 1   Language- name 2 objects 2 2 2 2 2   Language- repeat 1 1 1 1 1   Language- follow 3 step command 3 3 3  3 3  Language- read & follow direction 1 1 1 1 1   Write a sentence 1 1 1 1 1   Copy design 1 0 0 1 1  Total score 30 28 23 30 28    Neuroimaging: Brain MRI on 09/06/2019 revealed a small chronic left occipital lobe cortical infarct, chronic lacunar infarcts within the bilateral thalami and cerebellar hemispheres, and moderate microvascular ischemic disease. Cerebral volume was age appropriate. No more recent neuroimaging was available for review.  Clinical Interview:   The following information was obtained during a clinical interview with Mr. Kosman and his daughter prior to cognitive testing.  Cognitive Symptoms: Decreased short-term memory: Endorsed. Primary examples surrounded trouble recalling details of recent conversations or names of familiar individuals. His daughter added that Mr. Mcfarland will misplace things frequently. Both noted that information will often come with time and that difficulties seem exacerbated when he is fatigued and/or anxious. Difficulties have been present for the past several years and seem to vacillate rather than exhibit progressive decline.  Decreased long-term memory: Denied. Decreased attention/concentration: Endorsed. He reported difficulties with sustained attention and increased distractibility. Reduced processing speed: Denied. Difficulties with executive functions: Endorsed. He primarily reported difficulty with organization. Other aspects of functioning were described as intact. Both he and his daughter denied any trouble with impulsivity or prominent personality changes.  Difficulties with emotion regulation:  Denied. Difficulties with receptive language: Denied. Difficulties with word finding: Endorsed. Decreased visuoperceptual ability: Denied.  Difficulties completing ADLs: Denied. He manages medications independently. While he noted some procrastination surrounding bill paying and being impacted by disorganization, he is generally able to pay bills on time. His wife had expressed some concern about navigational decline while driving. He continues to drive without noted safety concerns.   Additional Medical History: History of traumatic brain injury/concussion: Denied. History of stroke: See neuroimaging above.  History of seizure activity: Denied. History of known exposure to toxins: Denied. Symptoms of chronic pain: Endorsed. He reported ongoing hip and knee pain, L > R. Injections were said to greatly help with pain alleviation.  Experience of frequent headaches/migraines: Denied. Frequent instances of dizziness/vertigo: Denied.  Sensory changes: He utilizes glasses with benefit. Other sensory changes/difficulties (e.g., hearing, taste, smell) were denied.  Balance/coordination difficulties: Denied. He also denied any recent falls.  Other motor difficulties: Denied.  Sleep History: Estimated hours obtained each night: 5-6 hours. Difficulties falling asleep: Endorsed. In the past, he described frequently waking around 2-3:00 am and then being unable to fall back asleep. This has notably improved since taking trazodone .  Difficulties staying asleep: Denied. Feels rested and refreshed upon awakening: Denied.  History of snoring: Endorsed. History of waking up gasping for air: Endorsed. Witnessed breath cessation while asleep: Endorsed. He has a history of obstructive sleep apnea. He does not utilize his CPAP machine due to mask discomfort. He and his daughter expressed an interest in an updated sleep study to better understand treatment options.   History of vivid dreaming:  Denied. Excessive movement while asleep: Denied. Instances of acting out his dreams: Denied.  Psychiatric/Behavioral Health History: Depression: He described his current mood as good and denied to his knowledge any prior mental health concerns or formal diagnoses.  Anxiety: Denied. Mania: Denied. Trauma History: Denied. Visual/auditory hallucinations: Denied. Delusional thoughts: Denied.  Tobacco: Denied. Alcohol: He denied current alcohol consumption as well as a history of problematic alcohol abuse or dependence.  Recreational drugs: Denied.  Family History: Problem Relation Age of Onset   Ulcers Father  had stomach issues, not sure what happened   Coronary artery disease Brother        male 1st degree relative <50   Colon cancer Neg Hx    Esophageal cancer Neg Hx    Rectal cancer Neg Hx    Stomach cancer Neg Hx    This information was confirmed by Mr. Walrath.  Academic/Vocational History: Highest level of educational attainment: Unclear. Mr. Narramore grew up and was formally educated in [place]. He moved to the [place]  in the 1970s and learned English across non-academic settings. His primary language is [place]. When asked, he was unsure if the schooling he completed would equate to a [place] -based high school diploma. He described himself as an average student in academic settings. Grammar was described as a likely relative weakness. For the purposes of the current evaluation, normative comparisons where made at 10 years of education where applicable.  History of developmental delay: Denied. History of grade repetition: Denied. Enrollment in special education courses: Denied. History of LD/ADHD: Denied.  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 No work-related performance issues were reported.   Evaluation Results:   Behavioral Observations: Mr. Gunner was accompanied by his daughter, arrived to his  appointment on time, and was appropriately dressed and groomed. He appeared alert. Observed gait and station were within normal limits. Gross motor functioning appeared intact upon informal observation and no abnormal movements (e.g., tremors) were noted. His affect was generally relaxed and positive. Spontaneous speech was fluent and word finding difficulties were not observed during the clinical interview. Thought processes were coherent, organized, and normal in content. Insight into his cognitive difficulties appeared adequate.   During testing, sustained attention was appropriate. Task engagement was adequate and he persisted when challenged. He was instructed to provide responses across fluency and expressive language tasks in Spanish if he was unable to come up with the [place] version. There remains some instances where he commented that he simply did not know the [place] word for what he was trying to express. Overall, Mr. Dauphin was cooperative with the clinical interview and subsequent testing procedures.   Adequacy of Effort: The validity of neuropsychological testing is limited by the extent to which the individual being tested may be assumed to have exerted adequate effort during testing. Mr. Speagle expressed his intention to perform to the best of his abilities and exhibited adequate task engagement and persistence. Scores across stand-alone and embedded performance validity measures were within expectation. As such, the results of the current evaluation are believed to be a valid representation of Mr. Bontempo' current cognitive functioning.  Test Results: Mr. Lasser was fully oriented at the time of the current evaluation.  Intellectual abilities based upon educational and vocational attainment were estimated to be in the below average to average range. Premorbid abilities were estimated to be within the below average range based upon a single-word reading test.   Processing speed was  variable, ranging from the exceptionally low to average normative ranges. Basic attention was below average to average. More complex attention (e.g., working memory) was well below average. Executive functioning was exceptionally low to below average. He did perform in the average range across a task assessing safety and judgment.  Assessed receptive language abilities were average. Likewise, Mr. Flett did not exhibit any difficulties comprehending task instructions and answered all questions asked of him appropriately. Assessed expressive language was exceptionally low to well below average.     Assessed  visuospatial/visuoconstructional abilities were variable, ranging from the exceptionally low to average normative ranges.    Learning (i.e., encoding) of novel verbal information was exceptionally low to well below average. Spontaneous delayed recall (i.e., retrieval) of previously learned information was variable, ranging from the exceptionally low to average normative ranges. Retention rates were 40% across a list learning task, 33% across a story learning task, and 47% across a figure drawing task. Performance across recognition tasks was exceptionally low to below average, suggesting limited evidence for information consolidation.   Results of emotional screening instruments suggested that recent symptoms of generalized anxiety were in the mild to moderate range, while symptoms of depression were within the mild range. A screening instrument assessing recent sleep quality suggested the presence of minimal sleep dysfunction.  Table of Scores:   Note: This summary of test scores accompanies the interpretive report and should not be considered in isolation without reference to the appropriate sections in the text. Descriptors are based on appropriate normative data and may be adjusted based on clinical judgment. Terms such as Within Normal Limits and Outside Normal Limits are used when a more  specific description of the test score cannot be determined.       Percentile - Normative Descriptor > 98 - Exceptionally High 91-97 - Well Above Average 75-90 - Above Average 25-74 - Average 9-24 - Below Average 2-8 - Well Below Average < 2 - Exceptionally Low       Validity:   DESCRIPTOR       DCT: --- --- Within Normal Limits  RBANS EI: --- --- Within Normal Limits       Orientation:      Raw Score Percentile   NAB Orientation, Form 1 29/29 --- ---       Cognitive Screening:      Raw Score Percentile   SLUMS: 10/30 --- ---       RBANS, Form A: Standard Score/ Scaled Score Percentile   Total Score 56 <1 Exceptionally Low  Immediate Memory 61 <1 Exceptionally Low    List Learning 4 2 Well Below Average    Story Memory 3 1 Exceptionally Low  Visuospatial/Constructional 89 23 Below Average    Figure Copy 12 75 Above Average    Line Orientation 11/20 3-9 Well Below Average  Language 54 <1 Exceptionally Low    Picture Naming 6/10 <2 Exceptionally Low    Semantic Fluency 3 1 Exceptionally Low  Attention 60 <1 Exceptionally Low    Digit Span 7 16 Below Average    Coding 1 <1 Exceptionally Low  Delayed Memory 64 1 Exceptionally Low    List Recall 2/10 10-16 Below Average    List Recognition 16/20 <2 Exceptionally Low    Story Recall 2 <1 Exceptionally Low    Story Recognition 6/12 3-4 Well Below Average    Figure Recall 8 25 Average    Figure Recognition 4/8 9-20 Below Average        Intellectual Functioning:      Standard Score Percentile   Test of Premorbid Functioning: 81 10 Below Average       Attention/Executive Function:     Color Trails Test: Raw Score (Z-Score) Percentile     Part 1  147 secs.,  1 error (-3.42) <1 Exceptionally Low    Part 2 Discontinued --- Impaired         Scaled Score Percentile   WAIS-5 Coding: 3 1 Exceptionally Low  WAIS-5 Naming Speed Quantity: 9 37 Average  Scaled Score Percentile   WAIS-5 Digits Forwards: 9 37 Average   WAIS-5 Digit Sequencing: 4 2 Well Below Average        Scaled Score Percentile   WAIS-5 Matrix Reasoning: 4 2 Well Below Average  WAIS-5 Figure Weights: 6 9 Below Average       D-KEFS Verbal Fluency Test: Raw Score (Scaled Score) Percentile     Letter Total Correct 16 (4) 2 Well Below Average    Category Total Correct 16 (3) 1 Exceptionally Low    Category Switching Total Correct 7 (4) 2 Well Below Average    Category Switching Accuracy 6 (5) 5 Well Below Average      Total Set Loss Errors 3 (9) 37 Average      Total Repetition Errors 1 (12) 75 Above Average       NAB Executive Functions Module, Form 1: T Score Percentile     Judgment 53 62 Average       Language:     Verbal Fluency Test: Raw Score (T Score) Percentile     Phonemic Fluency (FAS) 16 (33) 5 Well Below Average    Animal Fluency 8 (29) 2 Exceptionally Low        NAB Language Module, Form 1: T Score Percentile     Auditory Comprehension 47 38 Average    Naming 13/31 (19) <1 Exceptionally Low       Visuospatial/Visuoconstruction:      Raw Score Percentile   Clock Drawing: 7/10 --- Within Normal Limits        Scaled Score Percentile   WAIS-5 Block Design: 3 1 Exceptionally Low       Mood and Personality:      Raw Score Percentile   Geriatric Depression Scale: 12 --- Mild  Geriatric Anxiety Scale: 21 --- Mild    Somatic 8 --- Mild    Cognitive 6 --- Mild    Affective 7 --- Moderate       Additional Questionnaires:      Raw Score Percentile   PROMIS Sleep Disturbance Questionnaire: 22 --- None to Slight   Informed Consent and Coding/Compliance:   The current evaluation represents a clinical evaluation for the purposes previously outlined by the referral source and is in no way reflective of a forensic evaluation.   Mr. Valley was provided with a verbal description of the nature and purpose of the present neuropsychological evaluation. Also reviewed were the foreseeable risks and/or discomforts and benefits  of the procedure, limits of confidentiality, and mandatory reporting requirements of this provider. The patient was given the opportunity to ask questions and receive answers about the evaluation. Oral consent to participate was provided by the patient.   This evaluation was conducted by Arthea KYM Maryland, Ph.D., ABPP-CN, board certified clinical neuropsychologist. Mr. Hennington completed a clinical interview with Dr. Maryland, billed as one unit [phone], and 140 minutes of cognitive testing and scoring, billed as one unit [phone] and four additional units [id number]. Psychometrist Luke Pitcher, B.S. assisted Dr. Maryland with test administration and scoring procedures. As a separate and discrete service, one unit [phone] and two units 96133 (160 minutes) were billed for Dr. Loralee time spent in interpretation and report writing.

## 2024-06-14 NOTE — Progress Notes (Signed)
   Psychometrician Note   Cognitive testing was administered to [facility] by Harold Waters, B.S. (psychometrist) under the supervision of Dr. Zachary C. Merz, Ph.D., ABPP, licensed psychologist on 06/14/2024. Harold Waters did not appear overtly distressed by the testing session per behavioral observation or responses across self-report questionnaires. Rest breaks were offered.    The battery of tests administered was selected by Dr. Arthea KYM Maryland, Ph.D., ABPP with consideration to Harold Waters current level of functioning, the nature of his symptoms, emotional and behavioral responses during interview, level of literacy, observed level of motivation/effort, and the nature of the referral question. This battery was communicated to the psychometrist. Communication between Dr. Arthea KYM Maryland, Ph.D., ABPP and the psychometrist was ongoing throughout the evaluation and Dr. Arthea KYM Maryland, Ph.D., ABPP was immediately accessible at all times. Dr. Zachary C. Merz, Ph.D., ABPP provided supervision to the psychometrist on the date of this service to the extent necessary to assure the quality of all services provided.    Harold Waters will return within approximately 1-2 weeks for an interactive feedback session with Dr. Maryland at which time his test performances, clinical impressions, and treatment recommendations will be reviewed in detail. Harold Waters understands he can contact our office should he require our assistance before this time.  A total of 140 minutes of billable time were spent face-to-face with Harold Waters by the psychometrist. This includes both test administration and scoring time. Billing for these services is reflected in the clinical report generated by Dr. Arthea KYM Maryland, Ph.D., ABPP  This note reflects time spent with the psychometrician and does not include test scores or any clinical interpretations made by Dr. Maryland. The full report will follow in a separate note.

## 2024-06-14 NOTE — Telephone Encounter (Signed)
 Copied from CRM [id number]. Topic: General - Other >> Jun 13, 2024  4:00 PM Rosina BIRCH wrote: Reason for CRM: NP Charmaine from [facility] called stating she saw the patient last week and there was protein in his urine. Charmaine stated that his primary office is trying to put him on kerendia . Charmaine stated the patient potassium is to high to have it because it is 5.5 and the office need to cancel the prior authorization for that medication. Charmaine is going to restart the patient on jardiance  because his kidney function is doing better and she will put him on lokelma to help with his potassium. She will check labs routinely to help with adjustment  CB [phone]

## 2024-06-21 ENCOUNTER — Ambulatory Visit: Payer: PPO | Admitting: Psychology

## 2024-06-21 DIAGNOSIS — F03A0 Unspecified dementia, mild, without behavioral disturbance, psychotic disturbance, mood disturbance, and anxiety: Secondary | ICD-10-CM | POA: Diagnosis not present

## 2024-06-21 NOTE — Progress Notes (Signed)
   Neuropsychology Feedback Session Harold DEL. [facility] Department of Neurology  Reason for Referral:   Harold Waters is a 78 y.o. right-handed Hispanic male referred by Camie Sevin, PA-C, to characterize his current cognitive functioning and assist with diagnostic clarity and treatment planning in the context of concerns for vascular-related cognitive impairment.   Feedback:   Harold Waters completed a comprehensive neuropsychological evaluation on 06/14/2024. Please refer to that encounter for the full report and recommendations. Briefly, results suggested fairly diffuse cognitive impairment. Performances were appropriate relative to age-matched peers across basic attention, receptive language, and a task assessing safety and judgment. However, significant impairment was exhibited across complex attention, executive functioning, verbal fluency, confrontation naming, and encoding (i.e., learning) aspects of memory. Further weakness/variability was exhibited across processing speed and both delayed retrieval and recognition/consolidation aspects of memory. The cause for his mild dementia presentation remains uncertain. Historically, there have been concerns for a vascular dementia presentation. This is primarily based upon neuroimaging suggesting moderate microvascular ischemic disease, as well as several strokes spanning numerous brain regions (i.e., left occipital lobe, bilateral thalami, and bilateral cerebellar hemispheres). Given vascular disease and the diffuse nature of stroke activity, diffuse cognitive impairment is a reasonable expectation. His areas of greatest weakness (e.g., complex attention, executive functioning, encoding aspects of memory) are also very common in vascular etiologies. A primary vascular cause (i.e., vascular dementia) remains plausible.    Harold Waters was accompanied by his daughter during the current feedback session. Content of the current  session focused on the results of his neuropsychological evaluation. Harold Waters was given the opportunity to ask questions and his questions were answered. He was encouraged to reach out should additional questions arise. A copy of his report was provided at the conclusion of the visit.      One unit 96132 (35 minutes) was billed for Dr. Loralee time spent preparing for, conducting, and documenting the current feedback session with Harold Waters.

## 2024-06-28 DIAGNOSIS — N1832 Chronic kidney disease, stage 3b: Secondary | ICD-10-CM | POA: Diagnosis not present

## 2024-07-02 ENCOUNTER — Other Ambulatory Visit: Payer: Self-pay | Admitting: Internal Medicine

## 2024-07-04 ENCOUNTER — Other Ambulatory Visit: Payer: Self-pay

## 2024-07-04 MED ORDER — EZETIMIBE 10 MG PO TABS: 10.0000 mg | ORAL_TABLET | Freq: Every day | ORAL | 1 refills | Status: AC

## 2024-07-31 ENCOUNTER — Ambulatory Visit: Admitting: Internal Medicine

## 2024-09-04 ENCOUNTER — Encounter: Payer: Self-pay | Admitting: Radiology

## 2024-09-14 DIAGNOSIS — N1832 Chronic kidney disease, stage 3b: Secondary | ICD-10-CM | POA: Diagnosis not present

## 2024-09-14 DIAGNOSIS — E1122 Type 2 diabetes mellitus with diabetic chronic kidney disease: Secondary | ICD-10-CM | POA: Diagnosis not present

## 2024-09-14 DIAGNOSIS — I129 Hypertensive chronic kidney disease with stage 1 through stage 4 chronic kidney disease, or unspecified chronic kidney disease: Secondary | ICD-10-CM | POA: Diagnosis not present

## 2024-09-14 DIAGNOSIS — E875 Hyperkalemia: Secondary | ICD-10-CM | POA: Diagnosis not present

## 2024-09-14 LAB — LAB REPORT - SCANNED
Albumin, Urine POC: 38.2
Albumin/Creatinine Ratio, Urine, POC: 63
Creatinine, POC: 60.2 mg/dL

## 2024-09-22 ENCOUNTER — Ambulatory Visit: Payer: PPO

## 2024-09-22 VITALS — Ht 65.0 in | Wt 155.0 lb

## 2024-09-22 DIAGNOSIS — Z0000 Encounter for general adult medical examination without abnormal findings: Secondary | ICD-10-CM | POA: Diagnosis not present

## 2024-09-22 NOTE — Progress Notes (Signed)
 Chief Complaint  Patient presents with   Medicare Wellness     Subjective:   Harold Waters is a 78 y.o. male who presents for a Medicare Annual Wellness Visit.  Allergies (verified) Egg protein-containing drug products, Aricept  [donepezil ], and Coreg  [place] ]   History: Past Medical History:  Diagnosis Date   Actinic keratosis 09/06/2012   R forehead     AKI (acute kidney injury) 03/18/2024   Angina    Arthralgia 02/27/2010   12/12 due to fever  11/20 ?Crestor  related  2025  Chronic hand OA B     Atherosclerosis of native artery of extremity with intermittent claudication 06/02/2010      Crestor    Plavix      Bladder neck obstruction 03/30/2024   Flomax  Rx - use prn     CAD (coronary artery disease) 10/03/2012   RCA, OM3, 50-60% LAD Nov 2019- medical Rx  Normal LVF  Plavix , Coreg  - pt stopped, Isosorbide , Crestor   NTG prn     CAP (community acquired pneumonia) 10/12/2011   12/12  1/23  Better (was seen at [facility], had a CXR). Treated  03/2024     Cerebrovascular disease    moderate per MRI   Chronic stomach ulcer    get them off and on (09/19/2018)   CKD (chronic kidney disease) stage 4, GFR 15-29 ml/min 12/08/2023   [name]  - trying to approve the Rx  Check UACR  [name]  was $800     Constipation 01/29/2021   3/22 new x 3 wks. Self - limiting diverticulitis vs other     Coronary atherosclerosis of unspecified type of vessel, native or graft    Deficiency of other specified B group vitamins 12/12/2018   Diabetes mellitus type 2 with complications 06/22/2016   Metformin  - not taking  2/20 Cont w/Metformin  500 mg bid --d/c  1/23 On diet  CBG120-160     Distorted vision 08/23/2020   08/23/20 B ?retinal detachment vs other. Resolved     Diverticulosis    Diverticulosis of intestine, part unspecified, without perforation or abscess without bleeding 12/12/2018   DVT of lower extremity (deep venous thrombosis) (HCC) ~ 2010   LLE   Erosive gastritis     Essential hypertension 09/07/2007   Chronic   Losartan d/c, Amlodipine  d/c  Coreg  stopped  10/21 Clonidine  - BP is better w/Clonidine   He lost wt, eating better, less salt  11/22 Start Clonidine  patch  Cont Norvasc , Clonidine  patch  Refractory  Increase Hydrazine to 50 mg three times a day if tolerated  He had a PET CT on 10/07/21 w/[facility] - starting XRT seeds  Nephrology ref was made  1/23 SBP150-180  Increase Hydralazine  to    GERD (gastroesophageal reflux disease) 02/20/2008   Worse in 10/13, 4/16  Better on Nexium   Dr Aneita        Headache(784.0) 02/18/2012   lately   History of kidney stones    Hyperlipidemia associated with type 2 diabetes mellitus 09/07/2007   Chronic   On Crestor      Hypotension 03/18/2024   Insomnia 07/03/2019   Chronic  2020  Try Trazodone  - low dose     Internal hemorrhoids    Knee pain, bilateral 05/22/2020   L>R   B bursa anserina bursitis and OA  Much better after Medrol  pack     Low back pain 02/27/2010   Lower back pain    Malignant neoplasm of prostate 10/10/2021   Monitor PSA.  Follow-up with urology.  Status post radiation therapy with Dr. Patrcia     Migraine headache with aura 01/27/2013   Multifocal pneumonia 03/17/2024   Muscle weakness (generalized) 12/12/2018   Myocardial infarction 1997   Neoplasm of uncertain behavior of skin 10/18/2007   Moles on face  Skin ulcer w/pigment L cheek - Derm ref     Nonrheumatic aortic valve insufficiency 03/05/2023   Obstructive sleep apnea    cannot tolerate CPAP   Osteoarthritis    PAD (peripheral artery disease) 10/18/2018   Plavix , Crestor   S/p left femoral to above-the-knee popliteal artery bypass with vein by Dr. Sheree on 12/06/2018 due to lifestyle limiting left leg claudication.     Pain in joint, shoulder region 06/22/2016   Chronic R shoulder pain  H/o dislocation  ROM exercises  Tramadol  prn   Potential benefits of a long term opioids use as well as potential risks (i.e. addiction risk, apnea  etc) and complications (i.e. Somnolence, constipation and others) were explained to the patient and were aknowledged.     Pneumonia 1957   Postsurgical percutaneous transluminal coronary angioplasty status 09/07/2007   PSVT (paroxysmal supraventricular tachycardia) 02/18/2012   SOB (shortness of breath) 04/03/2009   Stroke    Chronic lacunar infarcts within the bilateral thalami and cerebellar hemispheres; small chronic left occipital lobe cortical infarct   URI (upper respiratory infection) 10/15/2022   10/2022  Z pac  Hycodan prn     Urinary frequency 08/23/2020   Venous (peripheral) insufficiency 12/19/2008   Vitamin D  deficiency 12/12/2018   Past Surgical History:  Procedure Laterality Date   ABDOMINAL AORTOGRAM W/LOWER EXTREMITY N/A 03/22/2023   Procedure: ABDOMINAL AORTOGRAM W/LOWER EXTREMITY;  Surgeon: Sheree Penne Bruckner, MD;  Location: [facility] INVASIVE CV LAB;  Service: Cardiovascular;  Laterality: N/A;   ARTHROSCOPY KNEE W/ DRILLING Right ~ 2001   BYPASS GRAFT Left 12/05/2018   femoral popliteal    CARDIAC CATHETERIZATION  09/19/2018   CATARACT EXTRACTION     COLONOSCOPY     CORONARY ANGIOPLASTY WITH STENT PLACEMENT  1997   2   CORONARY PRESSURE/FFR STUDY  09/19/2018   CORONARY PRESSURE/FFR STUDY N/A 09/19/2018   Procedure: INTRAVASCULAR PRESSURE WIRE/FFR STUDY;  Surgeon: Court Dorn PARAS, MD;  Location: [facility] INVASIVE CV LAB;  Service: Cardiovascular;  Laterality: N/A;   FEMORAL-POPLITEAL BYPASS GRAFT Left 12/06/2018   Procedure: BYPASS GRAFT FEMORAL-POPLITEAL ARTERY;  Surgeon: Sheree Penne Bruckner, MD;  Location: [facility] OR;  Service: Vascular;  Laterality: Left;   ILIAC ARTERY STENT  04/22/2017   . Placement of a 6 mm x 100 mm Viabahn covered stent left SFA   LEFT HEART CATH AND CORONARY ANGIOGRAPHY N/A 09/19/2018   Procedure: LEFT HEART CATH AND CORONARY ANGIOGRAPHY;  Surgeon: Court Dorn PARAS, MD;  Location: [facility] INVASIVE CV LAB;  Service: Cardiovascular;  Laterality:  N/A;   LEFT HEART CATHETERIZATION WITH CORONARY ANGIOGRAM N/A 02/19/2012   Procedure: LEFT HEART CATHETERIZATION WITH CORONARY ANGIOGRAM;  Surgeon: Ezra GORMAN Shuck, MD;  Location: [facility] CATH LAB;  Service: Cardiovascular;  Laterality: N/A;   LOWER EXTREMITY ANGIOGRAPHY Bilateral 04/22/2017   Procedure: Lower Extremity Angiography;  Surgeon: Court Dorn PARAS, MD;  Location: [facility] INVASIVE CV LAB;  Service: Cardiovascular;  Laterality: Bilateral;   LOWER EXTREMITY ANGIOGRAPHY Bilateral 09/19/2018   LOWER EXTREMITY ANGIOGRAPHY Bilateral 09/19/2018   Procedure: LOWER EXTREMITY ANGIOGRAPHY;  Surgeon: Court Dorn PARAS, MD;  Location: [facility] INVASIVE CV LAB;  Service: Cardiovascular;  Laterality: Bilateral;   PERIPHERAL ARTERIAL STENT GRAFT  09/2010   LLE  PERIPHERAL VASCULAR ATHERECTOMY Left 04/22/2017   Procedure: Peripheral Vascular Atherectomy;  Surgeon: Court Dorn PARAS, MD;  Location: [facility] INVASIVE CV LAB;  Service: Cardiovascular;  Laterality: Left;  SFA   PERIPHERAL VASCULAR ATHERECTOMY  03/22/2023   Procedure: PERIPHERAL VASCULAR ATHERECTOMY;  Surgeon: Sheree Penne Bruckner, MD;  Location: [facility] INVASIVE CV LAB;  Service: Cardiovascular;;   PERIPHERAL VASCULAR INTERVENTION Left 04/22/2017   Procedure: Peripheral Vascular Intervention;  Surgeon: Court Dorn PARAS, MD;  Location: [facility] INVASIVE CV LAB;  Service: Cardiovascular;  Laterality: Left;  SFA   PERIPHERAL VASCULAR INTERVENTION  03/22/2023   Procedure: PERIPHERAL VASCULAR INTERVENTION;  Surgeon: Sheree Penne Bruckner, MD;  Location: [facility] INVASIVE CV LAB;  Service: Cardiovascular;;   UPPER GASTROINTESTINAL ENDOSCOPY     Family History  Problem Relation Age of Onset   Ulcers Father        had stomach issues, not sure what happened   Coronary artery disease Brother        male 1st degree relative <50   Colon cancer Neg Hx    Esophageal cancer Neg Hx    Rectal cancer Neg Hx    Stomach cancer Neg Hx    Social History   Occupational History    Occupation: retired  Tobacco Use   Smoking status: Former    Current packs/day: 0.00    Average packs/day: 1 pack/day for 40.0 years (40.0 ttl pk-yrs)    Types: Cigarettes    Start date: 04/03/1970    Quit date: 04/03/2010    Years since quitting: 14.4    Passive exposure: Never   Smokeless tobacco: Never  Vaping Use   Vaping status: Never Used  Substance and Sexual Activity   Alcohol use: Not Currently    Comment: used to drink when I was young; quit ~ 1980's   Drug use: Never   Sexual activity: Yes   Tobacco Counseling Counseling given: Not Answered  SDOH Screenings   Food Insecurity: Food Insecurity Present (09/22/2024)  Housing: Unknown (09/22/2024)  Transportation Needs: No Transportation Needs (09/22/2024)  Utilities: Not At Risk (09/22/2024)  Alcohol Screen: Low Risk  (09/17/2023)  Depression (PHQ2-9): Low Risk  (09/22/2024)  Financial Resource Strain: Low Risk  (09/17/2023)  Physical Activity: Inactive (09/22/2024)  Social Connections: Socially Integrated (09/22/2024)  Stress: No Stress Concern Present (09/22/2024)  Tobacco Use: Medium Risk (09/22/2024)  Health Literacy: Inadequate Health Literacy (09/22/2024)   See flowsheets for full screening details  Depression Screen PHQ 2 & 9 Depression Scale- Over the past 2 weeks, how often have you been bothered by any of the following problems? Little interest or pleasure in doing things: 0 Feeling down, depressed, or hopeless (PHQ Adolescent also includes...irritable): 0 PHQ-2 Total Score: 0 Trouble falling or staying asleep, or sleeping too much: 2 (staying asleep) Feeling tired or having little energy: 1 Poor appetite or overeating (PHQ Adolescent also includes...weight loss): 0 Feeling bad about yourself - or that you are a failure or have let yourself or your family down: 0 Trouble concentrating on things, such as reading the newspaper or watching television (PHQ Adolescent also includes...like school work):  0 Moving or speaking so slowly that other people could have noticed. Or the opposite - being so fidgety or restless that you have been moving around a lot more than usual: 0 Thoughts that you would be better off dead, or of hurting yourself in some way: 0 PHQ-9 Total Score: 3 If you checked off any problems, how difficult have these problems made it for  you to do your work, take care of things at home, or get along with other people?: Not difficult at all  Depression Treatment Depression Interventions/Treatment : EYV7-0 Score <4 Follow-up Not Indicated     Goals Addressed   None    Visit info / Clinical Intake: Medicare Wellness Visit Type:: Subsequent Annual Wellness Visit Persons participating in visit:: patient Medicare Wellness Visit Mode:: Video Because this visit was a virtual/telehealth visit:: vitals recorded from last visit If Telephone or Video please confirm:: I connected with the patient using audio enabled telemedicine application and verified that I am speaking with the correct person using two identifiers; I discussed the limitations of evaluation and management by telemedicine; The patient expressed understanding and agreed to proceed Patient Location:: Home Provider Location:: office Information given by:: patient Interpreter Needed?: No Pre-visit prep was completed: no AWV questionnaire completed by patient prior to visit?: no Living arrangements:: lives with spouse/significant other Patient's Overall Health Status Rating: (!) fair Typical amount of pain: none Does pain affect daily life?: no Are you currently prescribed opioids?: no  Dietary Habits and Nutritional Risks How many meals a day?: 3 Eats fruit and vegetables daily?: yes Most meals are obtained by: preparing own meals In the last 2 weeks, have you had any of the following?: none Diabetic:: (!) yes Any non-healing wounds?: no How often do you check your BS?: 0 Would you like to be referred to a  Nutritionist or for Diabetic Management? : no  Functional Status Activities of Daily Living (to include ambulation/medication): Independent Ambulation: Independent with device- listed below Home Assistive Devices/Equipment: Eyeglasses Medication Administration: Independent Home Management: Independent Manage your own finances?: yes Primary transportation is: driving Concerns about vision?: no *vision screening is required for WTM* Concerns about hearing?: no  Fall Screening Falls in the past year?: 0 Number of falls in past year: 0 Was there an injury with Fall?: 0 Fall Risk Category Calculator: 0 Patient Fall Risk Level: Low Fall Risk  Fall Risk Patient at Risk for Falls Due to: No Fall Risks Fall risk Follow up: Falls evaluation completed; Falls prevention discussed  Home and Transportation Safety: All rugs have non-skid backing?: N/A, no rugs All stairs or steps have railings?: yes Grab bars in the bathtub or shower?: yes Have non-skid surface in bathtub or shower?: (!) no Good home lighting?: yes Regular seat belt use?: yes Hospital stays in the last year:: no  Cognitive Assessment Difficulty concentrating, remembering, or making decisions? : yes (concentrating) Will 6CIT or Mini Cog be Completed: yes What year is it?: 0 points What month is it?: 0 points Give patient an address phrase to remember (5 components): [address], [place] what time is it?: 0 points Count backwards from 20 to 1: 0 points Say the months of the year in reverse: 0 points Repeat the address phrase from earlier: 0 points 6 CIT Score: 0 points  Advance Directives (For Healthcare) Does Patient Have a Medical Advance Directive?: No Would patient like information on creating a medical advance directive?: No - Patient declined  Reviewed/Updated  Reviewed/Updated: Reviewed All (Medical, Surgical, Family, Medications, Allergies, Care Teams, Patient Goals)        Objective:    Today's  Vitals   09/22/24 0939  Weight: 155 lb (70.3 kg)  Height: 5' 5 (1.651 m)   Body mass index is 25.79 kg/m.  Current Medications (verified) Outpatient Encounter Medications as of 09/22/2024  Medication Sig   acetaminophen  (TYLENOL ) 500 MG tablet Take  500 mg by mouth every 6 (six) hours as needed for mild pain (pain score 1-3) or moderate pain (pain score 4-6).   cholecalciferol (VITAMIN D3) 25 MCG (1000 UNIT) tablet Take 1,000 Units by mouth in the morning.   clopidogrel  (PLAVIX ) 75 MG tablet TAKE 1 TABLET BY MOUTH EVERY DAY   empagliflozin  (JARDIANCE ) 10 MG TABS tablet Take 1 tablet (10 mg total) by mouth daily before breakfast.   ezetimibe  (ZETIA ) 10 MG tablet Take 1 tablet (10 mg total) by mouth daily.   memantine  (NAMENDA  XR) 14 MG CP24 24 hr capsule TAKE 1 CAPSULE BY MOUTH NIGHTLY   nitroGLYCERIN  (NITROSTAT ) 0.4 MG SL tablet Place 1 tablet under tongue every 5 mins. DO NOT USE MORE THAN 3 TABS   olmesartan (BENICAR) 20 MG tablet Take 20 mg by mouth daily.   rosuvastatin  (CRESTOR ) 20 MG tablet Take 1 tablet (20 mg total) by mouth daily.   sertraline  (ZOLOFT ) 25 MG tablet Take 1 tablet (25 mg total) by mouth daily. (Patient taking differently: Take 25 mg by mouth as needed.)   tamsulosin  (FLOMAX ) 0.4 MG CAPS capsule TAKE 1 CAPSULE BY MOUTH EVERY DAY (Patient taking differently: Take 0.4 mg by mouth as needed.)   traZODone  (DESYREL ) 50 MG tablet TAKE 1/4 TO 1/2 TABLET BY MOUTH AT NIGHT AS NEEDED FOR INSOMNIA   vitamin B-12 (CYANOCOBALAMIN ) 500 MCG tablet Take 500 mcg by mouth in the morning.   albuterol  (VENTOLIN  HFA) 108 (90 Base) MCG/ACT inhaler INHALE 2 PUFFS INTO THE LUNGS EVERY 4 HOURS AS NEEDED FOR WHEEZING OR SHORTNESS OF BREATH. (Patient not taking: Reported on 09/22/2024)   cefdinir  (OMNICEF ) 300 MG capsule Take 1 capsule (300 mg total) by mouth 2 (two) times daily. (Patient not taking: Reported on 09/22/2024)   cloNIDine  (CATAPRES ) 0.1 MG tablet Take 1 tablet (0.1 mg total) by  mouth 3 (three) times daily as needed. (Patient not taking: Reported on 09/22/2024)   [name]  10 MG TABS TAKE 1 TABLET BY MOUTH EVERY DAY (Patient not taking: Reported on 09/22/2024)   promethazine -dextromethorphan  (PROMETHAZINE -DM) 6.25-15 MG/5ML syrup Take 5 mLs by mouth 4 (four) times daily as needed for cough. (Patient not taking: Reported on 09/22/2024)   No facility-administered encounter medications on file as of 09/22/2024.   Hearing/Vision screen Hearing Screening - Comments:: Denies hearing difficulties   Vision Screening - Comments:: Wears eyeglasses/[employer]/UTD Immunizations and Health Maintenance Health Maintenance  Topic Date Due   Hepatitis C Screening  Never done   Zoster Vaccines- Shingrix (1 of 2) Never done   OPHTHALMOLOGY EXAM  08/27/2021   FOOT EXAM  06/02/2023   Influenza Vaccine  06/02/2024   Medicare Annual Wellness (AWV)  09/16/2024   HEMOGLOBIN A1C  09/17/2024   DTaP/Tdap/Td (2 - Tdap) 12/19/2024   Lung Cancer Screening  03/17/2025   Diabetic kidney evaluation - eGFR measurement  04/27/2025   Diabetic kidney evaluation - Urine ACR  09/14/2025   Pneumococcal Vaccine: 50+ Years  Completed   Meningococcal B Vaccine  Aged Out   Hepatitis B Vaccines 19-59 Average Risk  Discontinued   Colonoscopy  Discontinued   COVID-19 Vaccine  Discontinued        Assessment/Plan:  This is a routine wellness examination for Sharrell.  Patient Care Team: Garald Karlynn GAILS, MD as PCP - General (Internal Medicine) Wonda Sharper, MD as PCP - Cardiology (Cardiology) Pietro Redell RAMAN, MD as Consulting Physician (Cardiology) Sheree Penne Bruckner, MD as Consulting Physician (Vascular Surgery) Wonda Sharper, MD as Consulting Physician (Cardiology)  Georjean Darice HERO, MD as Consulting Physician (Neurology) Addie, Cordella Hamilton, MD as Consulting Physician (Orthopedic Surgery) Aneita Gwendlyn DASEN, MD (Inactive) as Consulting Physician (Gastroenterology) Szabat, Toribio BROCKS, [facility]  (Inactive) as Pharmacist (Pharmacist) Patrcia Cough, MD as Consulting Physician (Radiation Oncology) Neda Jennet LABOR, MD as Consulting Physician (Pulmonary Disease)  I have personally reviewed and noted the following in the patient's chart:   Medical and social history Use of alcohol, tobacco or illicit drugs  Current medications and supplements including opioid prescriptions. Functional ability and status Nutritional status Physical activity Advanced directives List of other physicians Hospitalizations, surgeries, and ER visits in previous 12 months Vitals Screenings to include cognitive, depression, and falls Referrals and appointments  No orders of the defined types were placed in this encounter.  In addition, I have reviewed and discussed with patient certain preventive protocols, quality metrics, and best practice recommendations. A written personalized care plan for preventive services as well as general preventive health recommendations were provided to patient.   Renika Shiflet L Cayson Kalb, CMA   09/22/2024   No follow-ups on file.  After Visit Summary: (MyChart) Due to this being a telephonic visit, the after visit summary with patients personalized plan was offered to patient via MyChart   Nurse Notes: Patient is due for a foot exam, a A1C check and a Hep C screening, which all can be done during his next office visit here.  Patient is also due for a Shingrix vaccine as well as a lung cancer screening.  Patient would like to discuss the lung screening during his next office visit.  He stated that he has had a diabetic eye exam at [facility] this year.

## 2024-09-22 NOTE — Patient Instructions (Addendum)
 Harold Waters,  Thank you for taking the time for your Medicare Wellness Visit. I appreciate your continued commitment to your health goals. Please review the care plan we discussed, and feel free to reach out if I can assist you further.  Please note that Annual Wellness Visits do not include a physical exam. Some assessments may be limited, especially if the visit was conducted virtually. If needed, we may recommend an in-person follow-up with your provider.  Ongoing Care Seeing your primary care provider every 3 to 6 months helps us  monitor your health and provide consistent, personalized care. Next office visit on 11/20/2024.  You are due for a foot exam, a A1c Check and a Hep C screening, which all can be done during your next office visit.  Remember to discuss a lung cancer screening with provider, as you are eligible for one.  You are also due for a Shingles vaccine, which can be given to you at any local pharmacy.    Referrals If a referral was made during today's visit and you haven't received any updates within two weeks, please contact the referred provider directly to check on the status.  Recommended Screenings:  Health Maintenance  Topic Date Due   Hepatitis C Screening  Never done   Zoster (Shingles) Vaccine (1 of 2) Never done   Eye exam for diabetics  08/27/2021   Complete foot exam   06/02/2023   Flu Shot  06/02/2024   Hemoglobin A1C  09/17/2024   DTaP/Tdap/Td vaccine (2 - Tdap) 12/19/2024   Screening for Lung Cancer  03/17/2025   Yearly kidney function blood test for diabetes  04/27/2025   Yearly kidney health urinalysis for diabetes  09/14/2025   Medicare Annual Wellness Visit  09/22/2025   Pneumococcal Vaccine for age over 1  Completed   Meningitis B Vaccine  Aged Out   Hepatitis B Vaccine  Discontinued   Colon Cancer Screening  Discontinued   COVID-19 Vaccine  Discontinued       09/22/2024    9:45 AM  Advanced Directives  Does Patient Have a Medical Advance  Directive? No    Vision: Annual vision screenings are recommended for early detection of glaucoma, cataracts, and diabetic retinopathy. These exams can also reveal signs of chronic conditions such as diabetes and high blood pressure.  Dental: Annual dental screenings help detect early signs of oral cancer, gum disease, and other conditions linked to overall health, including heart disease and diabetes.  Please see the attached documents for additional preventive care recommendations.

## 2024-10-27 ENCOUNTER — Other Ambulatory Visit: Payer: Self-pay | Admitting: Internal Medicine

## 2024-10-27 DIAGNOSIS — C61 Malignant neoplasm of prostate: Secondary | ICD-10-CM

## 2024-10-27 DIAGNOSIS — N2889 Other specified disorders of kidney and ureter: Secondary | ICD-10-CM

## 2024-10-27 DIAGNOSIS — I10 Essential (primary) hypertension: Secondary | ICD-10-CM

## 2024-10-30 ENCOUNTER — Telehealth

## 2024-10-30 DIAGNOSIS — Z20828 Contact with and (suspected) exposure to other viral communicable diseases: Secondary | ICD-10-CM

## 2024-10-30 DIAGNOSIS — R6889 Other general symptoms and signs: Secondary | ICD-10-CM

## 2024-10-31 MED ORDER — OSELTAMIVIR PHOSPHATE 30 MG PO CAPS: 30.0000 mg | ORAL_CAPSULE | Freq: Two times a day (BID) | ORAL | 0 refills | Status: AC

## 2024-10-31 MED ORDER — ONDANSETRON 4 MG PO TBDP: 4.0000 mg | ORAL_TABLET | Freq: Three times a day (TID) | ORAL | 0 refills | Status: AC | PRN

## 2024-10-31 NOTE — Progress Notes (Signed)
 E visit for Flu like symptoms   We are sorry that you are not feeling well.  Here is how we plan to help! Based on what you have shared with me it looks like you may have a respiratory virus that may be influenza.  Influenza or the flu is  an infection caused by a respiratory virus. The flu virus is highly contagious and persons who did not receive their yearly flu vaccination may catch the flu from close contact.  We have anti-viral medications to treat the viruses that cause this infection. They are not a cure and only shorten the course of the infection. These prescriptions are most effective when they are given within the first 2 days of flu symptoms. Antiviral medications are indicated if you have a high risk of complications from the flu. You should  also consider an antiviral medication if you are in close contact with someone who is at risk. These medications can help patients avoid complications from the flu but have side effects that you should know.   Possible side effects from Tamiflu or oseltamivir include nausea, vomiting, diarrhea, dizziness, headaches, eye redness, sleep problems or other respiratory symptoms. You should not take Tamiflu if you have an allergy to oseltamivir or any to the ingredients in Tamiflu.  Based upon your symptoms and potential risk factors I have prescribed Oseltamivir (Tamiflu).  It has been sent to your designated pharmacy.  You will take one 30 mg capsule orally twice a day for the next 5 days.   For nasal congestion, you may use an oral decongestant such as Mucinex  D or if you have glaucoma or high blood pressure use plain Mucinex .  Saline nasal spray or nasal drops can help and can safely be used as often as needed for congestion.  If you have a sore or scratchy throat, use a saltwater gargle-  to  teaspoon of salt dissolved in a 4-ounce to 8-ounce glass of warm water.  Gargle the solution for approximately 15-30 seconds and then spit.  It is  important not to swallow the solution.  You can also use throat lozenges/cough drops and Chloraseptic spray to help with throat pain or discomfort.  Warm or cold liquids can also be helpful in relieving throat pain.  For headache, pain or general discomfort, you can use Ibuprofen  or Tylenol  as directed.   Some authorities believe that zinc sprays or the use of Echinacea may shorten the course of your symptoms.  I have prescribed the following medications to help lessen symptoms: I have prescribed Zofran  4 mg tablets 1 every 6 hours if needed for nausea  You are to isolate at home until you have been fever-free for at least 24 hours without a fever-reducing medication, and symptoms have been steadily improving for 24 hours.  If you must be around other household members who do not have symptoms, you need to make sure that both you and the family members are masking consistently with a high-quality mask.  If you note any worsening of symptoms despite treatment, please seek an in-person evaluation ASAP. If you note any significant shortness of breath or any chest pain, please seek ED evaluation. Please do not delay care!  ANYONE WHO HAS FLU SYMPTOMS SHOULD: Stay home. The flu is highly contagious and going out or to work exposes others! Be sure to drink plenty of fluids. Water is fine as well as fruit juices, sodas and electrolyte beverages. You may want to stay away from caffeine or  alcohol. If you are nauseated, try taking small sips of liquids. How do you know if you are getting enough fluid? Your urine should be a pale yellow or almost colorless. Get rest. Taking a steamy shower or using a humidifier may help nasal congestion and ease sore throat pain. Using a saline nasal spray works much the same way. Cough drops, hard candies and sore throat lozenges may ease your cough. Line up a caregiver. Have someone check on you regularly.  GET HELP RIGHT AWAY IF: You cannot keep down liquids or your  medications. You become short of breath Your fell like you are going to pass out or loose consciousness. Your symptoms persist after you have completed your treatment plan  MAKE SURE YOU  Understand these instructions. Will watch your condition. Will get help right away if you are not doing well or get worse.  Your e-visit answers were reviewed by a board certified advanced clinical practitioner to complete your personal care plan.  Depending on the condition, your plan could have included both over the counter or prescription medications.  If there is a problem please reply  once you have received a response from your provider.  Your safety is important to us .  If you have drug allergies check your prescription carefully.    You can use MyChart to ask questions about todays visit, request a non-urgent call back, or ask for a work or school excuse for 24 hours related to this e-Visit. If it has been greater than 24 hours you will need to follow up with your provider, or enter a new e-Visit to address those concerns.  You will get an e-mail in the next two days asking about your experience.  I hope that your e-visit has been valuable and will speed your recovery. Thank you for using e-visits.   I have spent 5 minutes in review of e-visit questionnaire, review and updating patient chart, medical decision making and response to patient.   Elsie Velma Lunger, PA-C

## 2024-10-31 NOTE — Progress Notes (Signed)
 "   Mild dementia of unclear etiology, concern for vascular etiology   Harold Waters is a very pleasant 78 y.o. RH male with a history ofuncontrolled hypertension, hyperlipidemia, PAD, history of migraines with aura, recent multifocal pneumonia May 2025, severe sleep apnea noncompliant with CPAP, history of prostate cancer, status post seed implant and a diagnosis of mild dementia of unclear etiology with concerns for vascular per neuropsych evaluation August 2025, seen today in follow up for memory loss. Patient is currently on memantine  10 mg twice daily.***.  This patient is accompanied in the office by his daughter*** who supplements the history.  Previous records as well as any outside records available were reviewed prior to todays visit. Patient was last seen on 04/17/2024***. Memory is ***. MMSE today is  /30. Patient is able to participate on ADLs and continues to drive without difficulties. Mood is***   Continue memantine  10 mg twice daily, side effects discussed Check plasma biomarkers in an effort to evaluate the risk for AD  Recommend good control of cardiovascular risk factors.   Continue to control mood as per PCP Recommend using CPAP and following studies for sleep apnea as per PCP Follow up in  months   Discussed the use of AI scribe software for clinical note transcription with the patient, who gave verbal consent to proceed.  History of Present Illness       Any changes in memory since last visit? About the same.  He continues to write a list before going to a store to prevent forgetting the items.   LTM is good. Repeats oneself?  Endorsed Disoriented when walking into a room?  Endorsed, when I don't sleep well he is aware of it.   Leaving objects?  May misplace things such as glasses but not in unusual places   Wandering behavior?  denies   Any personality changes since last visit?  Before, he may have abrupt temper, but not often or different from prior. Any  worsening depression?:  Denies.   Hallucinations or paranoia?  Denies.   Seizures? denies    Any sleep changes?  He falls asleep on the couch, then goes to the bed and then after 2 I cannot go back to sleep. He is going to try to take trazodone  when he wakes up at 2 am.  Denies vivid dreams, REM behavior or sleepwalking   Sleep apnea?   And worse, noncompliant with his CPAP. Any hygiene concerns? Denies.  Independent of bathing and dressing?  Endorsed  Does the patient needs help with medications?  Daughter is in charge Who is in charge of the finances? Daughter  is in charge Any changes in appetite? I gained more weight. Drinks plenty water.      Patient have trouble swallowing? Denies.   Does the patient cook? No Any headaches?   denies   Any vision changes?Denies Chronic back pain  denies.   Ambulates with difficulty?  Has arthritic pain, which may limit mobility Recent falls or head injuries? Denies.     Unilateral weakness, numbness or tingling? denies   Any tremors?  Denies    Any anosmia?  Denies   Any incontinence of urine?  Endorsed   Any bowel dysfunction?   Denies      Patient lives with his wife  Does the patient drive?  Yes, he may take the wrong exit, occasionally   Neuropsych evaluation 06/14/2024, Dr. Richie  Briefly, results suggested fairly diffuse cognitive impairment. Performances were appropriate relative  to age-matched peers across basic attention, receptive language, and a task assessing safety and judgment. However, significant impairment was exhibited across complex attention, executive functioning, verbal fluency, confrontation naming, and encoding (i.e., learning) aspects of memory. Further weakness/variability was exhibited across processing speed and both delayed retrieval and recognition/consolidation aspects of memory. The cause for his mild dementia presentation remains uncertain. Historically, there have been concerns for a vascular dementia presentation. This  is primarily based upon neuroimaging suggesting moderate microvascular ischemic disease, as well as several strokes spanning numerous brain regions (i.e., left occipital lobe, bilateral thalami, and bilateral cerebellar hemispheres). Given vascular disease and the diffuse nature of stroke activity, diffuse cognitive impairment is a reasonable expectation. His areas of greatest weakness (e.g., complex attention, executive functioning, encoding aspects of memory) are also very common in vascular etiologies. A primary vascular cause (i.e., vascular dementia) remains plausible.     History on Initial Assessment 08/23/2019: This is a pleasant 78 year old right-handed man with a history of hypertension, hyperlipidemia, PAD, presenting for evaluation of memory loss. He feels that memory changes started soon after his surgery in February 2020 where he had a left femoral to AK popliteal artery bypass under general anesthesia.  Prior to this, he had minor memory changes, but soon after surgery he noticed that he was unable to do things that he could do easily in the past such as math. He used to do large numbers in the back of his head, but has more difficulty now. He lives with his wife who constantly says something about his memory saying you forget everything, which upsets him since this was not happening prior to the surgery. He denies getting lost driving but has missed his exit a few times because he is not focusing or forgets where he is going. He has forgotten bill payments and has had late charges (new as well). He has been forgetting his medications, his wife now helps him. He loses things constantly, his phone is lost all the time. He has put things for the pantry in the freezer. He denies leaving the stove on. He has word-finding difficulties and forgets names more. 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 He has also had a change in sleep pattern, he has always had sleep difficulties getting an average of 4 hours of sleep, but since the surgery he would only get 2-3 hours of sleep. With Trazodone  1 tab qhs he gets 4 hours. He has daytime drowsiness and would drag his feet because he is so tired. Mood is good. No family history of dementia, no history of significant head injuries or alcohol use.   He gets dizzy when walking or changing clothes, no falls. He recalls an episode of double vision 3-4 years ago, this has resolved. He has occasional difficulty swallowing water. He has some numbness in both hands. He denies any headaches, focal numbness/tingling/weakness, bowel/bladder dysfunction, anosmia, or tremors. He denies any prior history of stroke, head CT done in 07/2019 showed an old left posterior parietal infarct and old left superior cerebellar lacunar infarcts, mild chronic microvascular disease, no acute change.     MRI brain showed mild diffuse atrophy and moderate chronic microvascular disease, chronic small strokes in the left occipital lobe, bilateral cerebellar hemispheres and thalami.        04/17/2024   12:00 PM 12/01/2023  3:00 PM 07/19/2023   11:00 AM  MMSE - Mini Mental State Exam  Orientation to time 5 5 5   Orientation to Place 5 5 4   Registration 3 3 3   Attention/ Calculation 5 4 0  Recall 3 3 3   Language- name 2 objects 2 2 2   Language- repeat 1 1 1   Language- follow 3 step command 3 3 3   Language- read & follow direction 1 1 1   Write a sentence 1 1 1   Copy design 1 0 0  Total score 30 28 23        No data to display            Objective:    Neurological Exam:    VITALS:  There were no vitals filed for this visit.  GEN:  The patient appears stated age and is in NAD. HEENT:  Normocephalic, atraumatic.   Neurological examination:  General: NAD, well-groomed, appears stated age. Orientation: The patient is alert. Oriented to person,  place and date Cranial nerves: There is good facial symmetry.The speech is fluent and clear. No aphasia or dysarthria. Fund of knowledge is appropriate. Recent and remote memory are impaired. Attention and concentration are reduced. Able to name objects and repeat phrases.  Hearing is intact to conversational tone. *** Sensation: Sensation is intact to light touch throughout Motor: Strength is at least antigravity x4. DTR's 2/4 in UE/LE     Movement examination:  Tone: There is normal tone in the UE/LE Abnormal movements:  no tremor.  No myoclonus.  No asterixis.   Coordination:  There is no decremation with RAM's. Normal finger to nose  Gait and Station: The patient has no*** difficulty arising out of a deep-seated chair without the use of the hands. The patient's stride length is good.  Gait is cautious and narrow.    Thank you for allowing us  the opportunity to participate in the care of this nice patient. Please do not hesitate to contact us  for any questions or concerns.   Total time spent on today's visit was *** minutes dedicated to this patient today, preparing to see patient, examining the patient, ordering tests and/or medications and counseling the patient, documenting clinical information in the EHR or other health record, independently interpreting results and communicating results to the patient/family, discussing treatment and goals, answering patient's questions and coordinating care.  Cc:  Plotnikov, Aleksei V, MD  Camie Sevin 10/31/2024 7:10 AM      "

## 2024-11-03 ENCOUNTER — Ambulatory Visit: Admitting: Physician Assistant

## 2024-11-03 ENCOUNTER — Encounter: Payer: Self-pay | Admitting: Internal Medicine

## 2024-11-03 ENCOUNTER — Encounter: Payer: Self-pay | Admitting: Physician Assistant

## 2024-11-03 VITALS — BP 116/62 | HR 67 | Resp 20 | Ht 65.0 in | Wt 156.0 lb

## 2024-11-03 DIAGNOSIS — F03A0 Unspecified dementia, mild, without behavioral disturbance, psychotic disturbance, mood disturbance, and anxiety: Secondary | ICD-10-CM

## 2024-11-03 NOTE — Patient Instructions (Signed)
 " It was a pleasure to see you today at our office.   Recommendations:   Continue Memantine  XR14 mg at night . Side effects were discussed  Follow up sleep apnea with Dr. Garald regarding CPAP Continue  trazodone  for sleep Follow up July 21 at 11:30         RECOMMENDATIONS FOR ALL PATIENTS WITH MEMORY PROBLEMS: 1. Continue to exercise (Recommend 30 minutes of walking everyday, or 3 hours every week) 2. Increase social interactions - continue going to [place] and enjoy social gatherings with friends and family 3. Eat healthy, avoid fried foods and eat more fruits and vegetables 4. Maintain adequate blood pressure, blood sugar, and blood cholesterol level. Reducing the risk of stroke and cardiovascular disease also helps promoting better memory. 5. Avoid stressful situations. Live a simple life and avoid aggravations. Organize your time and prepare for the next day in anticipation. 6. Sleep well, avoid any interruptions of sleep and avoid any distractions in the bedroom that may interfere with adequate sleep quality 7. Avoid sugar, avoid sweets as there is a strong link between excessive sugar intake, diabetes, and cognitive impairment We discussed the Mediterranean diet, which has been shown to help patients reduce the risk of progressive memory disorders and reduces cardiovascular risk. This includes eating fish, eat fruits and green leafy vegetables, nuts like almonds and hazelnuts, walnuts, and also use olive oil. Avoid fast foods and fried foods as much as possible. Avoid sweets and sugar as sugar use has been linked to worsening of memory function.  There is always a concern of gradual progression of memory problems. If this is the case, then we may need to adjust level of care according to patient needs. Support, both to the patient and caregiver, should then be put into place.    FALL PRECAUTIONS: Be cautious when walking. Scan the area for obstacles that may increase the risk of  trips and falls. When getting up in the mornings, sit up at the edge of the bed for a few minutes before getting out of bed. Consider elevating the bed at the head end to avoid drop of blood pressure when getting up. Walk always in a well-lit room (use night lights in the walls). Avoid area rugs or power cords from appliances in the middle of the walkways. Use a walker or a cane if necessary and consider physical therapy for balance exercise. Get your eyesight checked regularly.  FINANCIAL OVERSIGHT: Supervision, especially oversight when making financial decisions or transactions is also recommended.  HOME SAFETY: Consider the safety of the kitchen when operating appliances like stoves, microwave oven, and blender. Consider having supervision and share cooking responsibilities until no longer able to participate in those. Accidents with firearms and other hazards in the house should be identified and addressed as well.   ABILITY TO BE LEFT ALONE: If patient is unable to contact 911 operator, consider using LifeLine, or when the need is there, arrange for someone to stay with patients. Smoking is a fire hazard, consider supervision or cessation. Risk of wandering should be assessed by caregiver and if detected at any point, supervision and safe proof recommendations should be instituted.  MEDICATION SUPERVISION: Inability to self-administer medication needs to be constantly addressed. Implement a mechanism to ensure safe administration of the medications.   DRIVING: Regarding driving, in patients with progressive memory problems, driving will be impaired. We advise to have someone else do the driving if trouble finding directions or if minor accidents are reported. Independent  driving assessment is available to determine safety of driving.   If you are interested in the driving assessment, you can contact the following:  [employer] in [place] [phone]  [facility] [phone]  [facility] [phone]  [place] [phone] or [phone]  MRI at [facility] [phone]   "

## 2024-11-13 ENCOUNTER — Other Ambulatory Visit: Payer: Self-pay | Admitting: Internal Medicine

## 2024-11-13 DIAGNOSIS — R0683 Snoring: Secondary | ICD-10-CM

## 2024-11-13 DIAGNOSIS — F5101 Primary insomnia: Secondary | ICD-10-CM

## 2024-11-20 ENCOUNTER — Encounter: Payer: Self-pay | Admitting: Internal Medicine

## 2024-11-20 ENCOUNTER — Ambulatory Visit: Admitting: Internal Medicine

## 2024-11-20 VITALS — BP 128/62 | HR 70 | Ht 65.0 in | Wt 157.4 lb

## 2024-11-20 DIAGNOSIS — E118 Type 2 diabetes mellitus with unspecified complications: Secondary | ICD-10-CM

## 2024-11-20 DIAGNOSIS — I25119 Atherosclerotic heart disease of native coronary artery with unspecified angina pectoris: Secondary | ICD-10-CM | POA: Diagnosis not present

## 2024-11-20 DIAGNOSIS — M2550 Pain in unspecified joint: Secondary | ICD-10-CM

## 2024-11-20 DIAGNOSIS — G5603 Carpal tunnel syndrome, bilateral upper limbs: Secondary | ICD-10-CM

## 2024-11-20 DIAGNOSIS — Z7984 Long term (current) use of oral hypoglycemic drugs: Secondary | ICD-10-CM | POA: Diagnosis not present

## 2024-11-20 DIAGNOSIS — N184 Chronic kidney disease, stage 4 (severe): Secondary | ICD-10-CM | POA: Diagnosis not present

## 2024-11-20 DIAGNOSIS — I739 Peripheral vascular disease, unspecified: Secondary | ICD-10-CM | POA: Diagnosis not present

## 2024-11-20 DIAGNOSIS — R413 Other amnesia: Secondary | ICD-10-CM | POA: Insufficient documentation

## 2024-11-20 DIAGNOSIS — I679 Cerebrovascular disease, unspecified: Secondary | ICD-10-CM

## 2024-11-20 DIAGNOSIS — G5600 Carpal tunnel syndrome, unspecified upper limb: Secondary | ICD-10-CM | POA: Insufficient documentation

## 2024-11-20 NOTE — Assessment & Plan Note (Signed)
 New R>>L Splint, CBD cream

## 2024-11-20 NOTE — Assessment & Plan Note (Signed)
 Chronic hand OA B Use CBD cream

## 2024-11-20 NOTE — Assessment & Plan Note (Addendum)
 [name]  - trying to approve the Rx Check UACR [name]  was $800 O[place]ardiance 

## 2024-11-20 NOTE — Assessment & Plan Note (Signed)
 Seeing Ms Harold Waters Get Shingrix On Memantine 

## 2024-11-20 NOTE — Assessment & Plan Note (Signed)
 No CP NTG prn SL

## 2024-11-20 NOTE — Assessment & Plan Note (Signed)
On Plavix, Crestor

## 2024-11-20 NOTE — Progress Notes (Signed)
 "  Subjective:  Patient ID: Harold Waters, male    DOB: September 13, 1946  Age: 79 y.o. MRN: [id number]  CC: Medical Management of Chronic Issues (6 Month follow up)   HPI [facility] presents for dementia, CAD, PAD, dyslipidemia C/o numbness in R hand>>L hand up to the elbows. No neck pain He just had labs Nephrology, he had PSA done too  Outpatient Medications Prior to Visit  Medication Sig Dispense Refill   acetaminophen  (TYLENOL ) 500 MG tablet Take 500 mg by mouth every 6 (six) hours as needed for mild pain (pain score 1-3) or moderate pain (pain score 4-6).     cholecalciferol (VITAMIN D3) 25 MCG (1000 UNIT) tablet Take 1,000 Units by mouth in the morning.     clopidogrel  (PLAVIX ) 75 MG tablet TAKE 1 TABLET BY MOUTH EVERY DAY 90 tablet 3   empagliflozin  (JARDIANCE ) 10 MG TABS tablet Take 1 tablet (10 mg total) by mouth daily before breakfast. 90 tablet 3   ezetimibe  (ZETIA ) 10 MG tablet Take 1 tablet (10 mg total) by mouth daily. 90 tablet 1   memantine  (NAMENDA  XR) 14 MG CP24 24 hr capsule TAKE 1 CAPSULE BY MOUTH NIGHTLY 90 capsule 4   nitroGLYCERIN  (NITROSTAT ) 0.4 MG SL tablet Place 1 tablet under tongue every 5 mins. DO NOT USE MORE THAN 3 TABS 25 tablet 2   olmesartan (BENICAR) 20 MG tablet Take 20 mg by mouth daily.     ondansetron  (ZOFRAN -ODT) 4 MG disintegrating tablet Take 1 tablet (4 mg total) by mouth every 8 (eight) hours as needed for nausea or vomiting. 20 tablet 0   rosuvastatin  (CRESTOR ) 20 MG tablet TAKE 1 TABLET BY MOUTH EVERY DAY 90 tablet 3   tamsulosin  (FLOMAX ) 0.4 MG CAPS capsule TAKE 1 CAPSULE BY MOUTH EVERY DAY (Patient taking differently: Take 0.4 mg by mouth as needed.) 90 capsule 1   traZODone  (DESYREL ) 50 MG tablet TAKE 1/4 TO 1/2 TABLET BY MOUTH AT NIGHT AS NEEDED FOR INSOMNIA 45 tablet 3   vitamin B-12 (CYANOCOBALAMIN ) 500 MCG tablet Take 500 mcg by mouth in the morning.     albuterol  (VENTOLIN  HFA) 108 (90 Base) MCG/ACT inhaler INHALE 2 PUFFS INTO  THE LUNGS EVERY 4 HOURS AS NEEDED FOR WHEEZING OR SHORTNESS OF BREATH. (Patient not taking: Reported on 11/20/2024) 18 each 2   cefdinir  (OMNICEF ) 300 MG capsule Take 1 capsule (300 mg total) by mouth 2 (two) times daily. (Patient not taking: Reported on 11/20/2024) 20 capsule 0   cloNIDine  (CATAPRES ) 0.1 MG tablet Take 1 tablet (0.1 mg total) by mouth 3 (three) times daily as needed. (Patient not taking: Reported on 11/20/2024) 90 tablet 3   [name]  10 MG TABS TAKE 1 TABLET BY MOUTH EVERY DAY (Patient not taking: Reported on 11/20/2024) 30 tablet 5   promethazine -dextromethorphan  (PROMETHAZINE -DM) 6.25-15 MG/5ML syrup Take 5 mLs by mouth 4 (four) times daily as needed for cough. (Patient not taking: Reported on 11/20/2024) 240 mL 0   No facility-administered medications prior to visit.    ROS: Review of Systems  Constitutional:  Negative for appetite change, fatigue and unexpected weight change.  HENT:  Negative for congestion, nosebleeds, sneezing, sore throat and trouble swallowing.   Eyes:  Negative for itching and visual disturbance.  Respiratory:  Negative for cough.   Cardiovascular:  Negative for chest pain, palpitations and leg swelling.  Gastrointestinal:  Negative for abdominal distention, blood in stool, diarrhea and nausea.  Genitourinary:  Negative for frequency and hematuria.  Musculoskeletal:  Negative for back pain, gait problem, joint swelling and neck pain.  Skin:  Negative for rash.  Neurological:  Positive for numbness. Negative for dizziness, tremors, speech difficulty and weakness.  Hematological:  Does not bruise/bleed easily.  Psychiatric/Behavioral:  Positive for decreased concentration. Negative for agitation, dysphoric mood, sleep disturbance and suicidal ideas. The patient is not nervous/anxious.     Objective:  BP 128/62   Pulse 70   Ht 5' 5 (1.651 m)   Wt 157 lb 6.4 oz (71.4 kg)   SpO2 98%   BMI 26.19 kg/m   BP Readings from Last 3 Encounters:  11/20/24  128/62  11/03/24 116/62  05/10/24 (!) 165/69    Wt Readings from Last 3 Encounters:  11/20/24 157 lb 6.4 oz (71.4 kg)  11/03/24 156 lb (70.8 kg)  09/22/24 155 lb (70.3 kg)    Physical Exam Constitutional:      General: He is not in acute distress.    Appearance: Normal appearance. He is well-developed.     Comments: NAD  Eyes:     Conjunctiva/sclera: Conjunctivae normal.     Pupils: Pupils are equal, round, and reactive to light.  Neck:     Thyroid : No thyromegaly.     Vascular: No JVD.  Cardiovascular:     Rate and Rhythm: Normal rate and regular rhythm.     Heart sounds: Normal heart sounds. No murmur heard.    No friction rub. No gallop.  Pulmonary:     Effort: Pulmonary effort is normal. No respiratory distress.     Breath sounds: Normal breath sounds. No wheezing or rales.  Chest:     Chest wall: No tenderness.  Abdominal:     General: Bowel sounds are normal. There is no distension.     Palpations: Abdomen is soft. There is no mass.     Tenderness: There is no abdominal tenderness. There is no guarding or rebound.  Musculoskeletal:        General: No tenderness. Normal range of motion.     Cervical back: Normal range of motion.  Lymphadenopathy:     Cervical: No cervical adenopathy.  Skin:    General: Skin is warm and dry.     Findings: No rash.  Neurological:     Mental Status: He is alert and oriented to person, place, and time. Mental status is at baseline.     Cranial Nerves: No cranial nerve deficit.     Motor: Weakness present. No abnormal muscle tone.     Coordination: Coordination normal.     Gait: Gait normal.     Deep Tendon Reflexes: Reflexes are normal and symmetric. Reflexes normal.  Psychiatric:        Behavior: Behavior normal.        Thought Content: Thought content normal.   CTS signs (+) on the R Neck w/decr ROM  Lab Results  Component Value Date   WBC 5.3 04/27/2024   HGB 11.9 (L) 04/27/2024   HCT 36.2 (L) 04/27/2024   PLT 178.0  04/27/2024   GLUCOSE 91 04/27/2024   CHOL 135 10/11/2023   TRIG 90 10/11/2023   HDL 50 10/11/2023   LDLDIRECT 167 (H) 10/25/2019   LDLCALC 68 10/11/2023   ALT 26 04/27/2024   AST 18 04/27/2024   NA 139 04/27/2024   K 4.2 04/27/2024   CL 105 04/27/2024   CREATININE 1.89 (H) 04/27/2024   BUN 24 (H) 04/27/2024   CO2 30 04/27/2024   TSH 2.246  03/18/2024   PSA 9.90 (H) 06/26/2021   INR 0.99 11/28/2018   HGBA1C 6.7 (H) 03/17/2024   MICROALBUR 40.7 (H) 04/27/2024    VAS US  LOWER EXTREMITY BYPASS GRAFT DUPLEX Result Date: 05/10/2024 LOWER EXTREMITY ARTERIAL DUPLEX STUDY Patient Name:  Harold Waters  Date of Exam:   05/10/2024 Medical Rec #: [id number]             Accession #:    [id number] Date of Birth: [birth year]              Patient Gender: M Patient Age:   26 years Exam Location:  [address] Procedure:      VAS US  LOWER EXTREMITY BYPASS GRAFT DUPLEX Referring Phys: PENNE COLORADO --------------------------------------------------------------------------------  Indications: Peripheral artery disease. High Risk Factors: Hypertension, hyperlipidemia, prior MI, coronary artery                    disease.  Vascular Interventions: 03/22/2023-                         1. Ultrasound-guided cannulation and percutaneous                         closure with Mynx device right common femoral artery                         2. Aortogram with selective catheterization of left                         lower extremity bypass graft and left lower extremity                         angiography                         3. Laser athrectomy of the left femoral to                         above-knee popliteal bypass graft with 2.41mm [place]                         4. Stent of left lower extremity femoropopliteal                         bypass using 6 x 150 mm Eluvia.                          12/06/2018 left femoral-popliteal bypass graft                         placement. Current ABI:            R 1.03 L 1.03 Comparison  Study: 11/10/23 Performing Technologist: Duwaine Hives RVS  Examination Guidelines: A complete evaluation includes B-mode imaging, spectral Doppler, color Doppler, and power Doppler as needed of all accessible portions of each vessel. Bilateral testing is considered an integral part of a complete examination. Limited examinations for reoccurring indications may be performed as noted.   Left Stent(s): +------------------------------------+--------+--------+---------+--------+ Within proximal fem-pop bypass graftPSV cm/sStenosisWaveform Comments +------------------------------------+--------+--------+---------+--------+ Prox to Stent  120             biphasic          +------------------------------------+--------+--------+---------+--------+ Proximal Stent                      82              triphasic         +------------------------------------+--------+--------+---------+--------+ Mid Stent                           85              triphasic         +------------------------------------+--------+--------+---------+--------+ Distal Stent                        95              triphasic         +------------------------------------+--------+--------+---------+--------+ Distal to Stent                     64              triphasic         +------------------------------------+--------+--------+---------+--------+   Left Graft #1: fem-pop +--------------------+--------+--------+---------+--------+                     PSV cm/sStenosisWaveform Comments +--------------------+--------+--------+---------+--------+ Inflow              147             biphasic          +--------------------+--------+--------+---------+--------+ Proximal Anastomosis123             biphasic          +--------------------+--------+--------+---------+--------+ Proximal Graft                               stent     +--------------------+--------+--------+---------+--------+ Mid Graft           46              triphasic         +--------------------+--------+--------+---------+--------+ Distal Graft        45              triphasic         +--------------------+--------+--------+---------+--------+ Distal Anastomosis  58              triphasic         +--------------------+--------+--------+---------+--------+ Outflow             64              triphasic         +--------------------+--------+--------+---------+--------+   Summary: Left: Patent stent within proximal left femoral-popliteal bypass graft. Patent left femoral-popliteal bypass graft without evidence of hemodynamically significant stenosis.   See table(s) above for measurements and observations. Electronically signed by Penne Colorado MD on 05/10/2024 at 4:40:55 PM.    Final    VAS US  ABI WITH/WO TBI Result Date: 05/10/2024  LOWER EXTREMITY DOPPLER STUDY Patient Name:  Harold Waters  Date of Exam:   05/10/2024 Medical Rec #: [id number]             Accession #:    [id number] Date of Birth: 1946/04/15              Patient  Gender: M Patient Age:   55 years Exam Location:  [address] Procedure:      VAS US  ABI WITH/WO TBI Referring Phys: PENNE COLORADO --------------------------------------------------------------------------------  Indications: Peripheral artery disease.  High Risk Factors: Hypertension, hyperlipidemia.  Vascular Interventions: 03/22/2023 1. Ultrasound-guided cannulation and                         percutaneous closure with Mynx device right common                         femoral artery                         2. Aortogram with selective catheterization of left                         lower extremity bypass graft and left lower extremity                         angiography                         3. Laser athrectomy of the left femoral to above-knee                         popliteal bypass graft with 2.17mm Auryon                          4. Stent of left lower extremity femoropopliteal bypass                         using 6 x 150 mm Eluvia                          12/06/2018 left femoral-popliteal bypass graft placement. Comparison Study: 11/10/23 Performing Technologist: Duwaine Hives RVS  Examination Guidelines: A complete evaluation includes at minimum, Doppler waveform signals and systolic blood pressure reading at the level of bilateral brachial, anterior tibial, and posterior tibial arteries, when vessel segments are accessible. Bilateral testing is considered an integral part of a complete examination. Photoelectric Plethysmograph (PPG) waveforms and toe systolic pressure readings are included as required and additional duplex testing as needed. Limited examinations for reoccurring indications may be performed as noted.  ABI Findings: +---------+------------------+-----+---------+--------+ Right    Rt Pressure (mmHg)IndexWaveform Comment  +---------+------------------+-----+---------+--------+ Brachial 118                                      +---------+------------------+-----+---------+--------+ PTA      123               1.02 triphasic         +---------+------------------+-----+---------+--------+ DP       112               0.93 triphasic         +---------+------------------+-----+---------+--------+ Great Toe73                0.61                   +---------+------------------+-----+---------+--------+ +---------+------------------+-----+---------+-------+ Left     Lt Pressure (mmHg)IndexWaveform Comment +---------+------------------+-----+---------+-------+ Brachial  120                                     +---------+------------------+-----+---------+-------+ PTA      123               1.02 triphasic        +---------+------------------+-----+---------+-------+ DP       115               0.96 triphasic        +---------+------------------+-----+---------+-------+ Great  Toe55                0.46                  +---------+------------------+-----+---------+-------+ +-------+-----------+-----------+------------+------------+ ABI/TBIToday's ABIToday's TBIPrevious ABIPrevious TBI +-------+-----------+-----------+------------+------------+ Right  1.03       0.61       0.98        0.77         +-------+-----------+-----------+------------+------------+ Left   1.03       0.46       1.00        0.68         +-------+-----------+-----------+------------+------------+ Bilateral ABIs appear essentially unchanged compared to prior study on 11/10/23.  Summary: Right: Resting right ankle-brachial index is within normal range. The right toe-brachial index is abnormal. Left: Resting left ankle-brachial index is within normal range. The left toe-brachial index is abnormal. *See table(s) above for measurements and observations.  Electronically signed by Penne Colorado MD on 05/10/2024 at 1:36:04 PM.    Final     Assessment & Plan:   Problem List Items Addressed This Visit     Arthralgia   Chronic hand OA B Use CBD cream      CAD (coronary artery disease)   No CP NTG prn SL      Diabetes mellitus type 2 with complications - Primary   CAD, PAD - on Plavix  On Jardiance  On diet He just had labs Nephrology, he had PSA done too      PAD (peripheral artery disease)   On Plavix , Crestor       CKD (chronic kidney disease) stage 4, GFR 15-29 ml/min   [name]  - trying to approve the Rx Check [facility] [name]  was $800 OJardiance       Cerebrovascular disease   On Plavix       CTS (carpal tunnel syndrome)   New R>>L Splint, CBD cream      Memory loss   Seeing Ms Dina Get Shingrix On Memantine           No orders of the defined types were placed in this encounter.     Follow-up: Return in about 3 months (around 02/18/2025) for a follow-up visit.  Marolyn Noel, MD "

## 2024-11-20 NOTE — Patient Instructions (Addendum)
" °  Splint, CBD cream  Recent large-scale observational studies provide strong evidence that the shingles vaccine is associated with a reduced risk of developing dementia. It may also help slow cognitive decline in individuals who already have dementia.  Key Findings from Recent Research Multiple natural experiment studies, which leveraged unique vaccination policies to minimize bias, have consistently found a protective link:  Reduced Risk: One large study, published in [place], analyzed health records from over 280,000 older adults in [place] and found that those who received the live-attenuated shingles vaccine (Zostavax, now discontinued in the [place] ) were approximately 20% less likely to develop dementia over a seven-year follow-up period. Slower Progression: A follow-up study published in Cell indicated that for those already living with dementia, the vaccine appeared to slow the progression of the disease and reduced dementia-related deaths by nearly half over nine years. Vascular Dementia Protection: Other research presented at [facility] 2025 indicated that the vaccine lowered the risk of vascular dementia by 50%. Newer Vaccine: A separate study published in [place] [facility] suggested that the newer, more effective recombinant vaccine (Shingrix, currently used in the [place] ) also offers significant protection against dementia, with an association of lower risk than the older vaccine type. Stronger Effect in Women: Several studies noted that the protective effect against dementia was more pronounced in women than in men, possibly due to differences in immune responses or dementia pathogenesis.  Why Might the Vaccine Help? Researchers are still working to determine the exact mechanism, but current theories center on the idea that preventing the shingles virus (varicella-zoster) from reactivating helps protect the brain:  Reduced Inflammation: The most likely explanation is that the vaccine prevents shingles  infections and subsequent inflammation in the nervous system, which is a known risk factor for neurodegenerative diseases like dementia. Immune System Modulation: It's also possible that the vaccine boosts the immune system more broadly, helping the body combat other processes related to cognitive decline.  Important Considerations Observational Data: While the evidence is strong, these findings primarily come from observational studies, which show an association, not a definitive cause-and-effect relationship. A large-scale randomized controlled trial is the gold standard for conclusive proof, but such trials are difficult to conduct for dementia due to the time and cost involved. Public Health Recommendation: The [place]. Centers for Disease Control and Prevention (CDC) already recommends the two-dose Shingrix vaccine for all healthy adults aged 44 and older primarily to prevent shingles and its painful complications. The potential added benefit for dementia prevention provides another compelling reason to get vaccinated.   "

## 2024-11-20 NOTE — Assessment & Plan Note (Addendum)
 CAD, PAD - on Plavix  On Jardiance  On diet He just had labs Nephrology, he had PSA done too

## 2024-11-20 NOTE — Assessment & Plan Note (Signed)
 On Plavix 

## 2024-11-23 ENCOUNTER — Encounter: Payer: Self-pay | Admitting: *Deleted

## 2024-11-23 NOTE — Progress Notes (Signed)
 Harold Waters                                          MRN: [id number]   11/23/2024   The [facility] Quality Team Specialist reviewed this patient medical record for the purposes of chart review for care gap closure. The following were reviewed: chart review for care gap closure-controlling blood pressure.    [facility] Quality Team

## 2025-05-22 ENCOUNTER — Ambulatory Visit: Payer: Self-pay | Admitting: Physician Assistant
